# Patient Record
Sex: Female | Born: 1975 | Race: White | Hispanic: No | Marital: Married | Smoking: Current every day smoker
Health system: Southern US, Community
[De-identification: ages and names within clinical notes are randomized; demographics above are authoritative.]

## PROBLEM LIST (undated history)

## (undated) DIAGNOSIS — F319 Bipolar disorder, unspecified: Secondary | ICD-10-CM

## (undated) DIAGNOSIS — E119 Type 2 diabetes mellitus without complications: Secondary | ICD-10-CM

## (undated) DIAGNOSIS — M359 Systemic involvement of connective tissue, unspecified: Secondary | ICD-10-CM

## (undated) DIAGNOSIS — Z8739 Personal history of other diseases of the musculoskeletal system and connective tissue: Secondary | ICD-10-CM

## (undated) DIAGNOSIS — D509 Iron deficiency anemia, unspecified: Secondary | ICD-10-CM

## (undated) DIAGNOSIS — S82492A Other fracture of shaft of left fibula, initial encounter for closed fracture: Secondary | ICD-10-CM

## (undated) DIAGNOSIS — N189 Chronic kidney disease, unspecified: Secondary | ICD-10-CM

## (undated) DIAGNOSIS — E1122 Type 2 diabetes mellitus with diabetic chronic kidney disease: Secondary | ICD-10-CM

## (undated) DIAGNOSIS — Z87442 Personal history of urinary calculi: Secondary | ICD-10-CM

## (undated) DIAGNOSIS — T7840XA Allergy, unspecified, initial encounter: Secondary | ICD-10-CM

## (undated) DIAGNOSIS — I219 Acute myocardial infarction, unspecified: Secondary | ICD-10-CM

## (undated) DIAGNOSIS — I10 Essential (primary) hypertension: Secondary | ICD-10-CM

## (undated) DIAGNOSIS — J45909 Unspecified asthma, uncomplicated: Secondary | ICD-10-CM

## (undated) DIAGNOSIS — M1990 Unspecified osteoarthritis, unspecified site: Secondary | ICD-10-CM

## (undated) DIAGNOSIS — F32A Depression, unspecified: Secondary | ICD-10-CM

## (undated) HISTORY — DX: Allergy, unspecified, initial encounter: T78.40XA

## (undated) HISTORY — DX: Unspecified osteoarthritis, unspecified site: M19.90

## (undated) HISTORY — DX: Depression, unspecified: F32.A

## (undated) HISTORY — DX: Iron deficiency anemia, unspecified: D50.9

## (undated) HISTORY — DX: Type 2 diabetes mellitus without complications: E11.9

## (undated) HISTORY — DX: Bipolar disorder, unspecified: F31.9

## (undated) HISTORY — PX: CYST EXCISION: SHX5701

## (undated) HISTORY — DX: Acute myocardial infarction, unspecified: I21.9

---

## 2001-10-17 HISTORY — PX: TONSILLECTOMY AND ADENOIDECTOMY: SUR1326

## 2007-10-18 HISTORY — PX: LITHOTRIPSY: SUR834

## 2007-10-18 HISTORY — PX: KIDNEY STONE SURGERY: SHX686

## 2008-03-14 ENCOUNTER — Emergency Department: Payer: Self-pay | Admitting: Emergency Medicine

## 2008-03-28 ENCOUNTER — Ambulatory Visit: Payer: Self-pay | Admitting: Specialist

## 2008-04-03 ENCOUNTER — Ambulatory Visit: Payer: Self-pay | Admitting: Specialist

## 2008-04-21 ENCOUNTER — Ambulatory Visit: Payer: Self-pay | Admitting: Specialist

## 2008-10-17 HISTORY — PX: KIDNEY STONE SURGERY: SHX686

## 2011-02-02 ENCOUNTER — Emergency Department: Payer: Self-pay | Admitting: Emergency Medicine

## 2013-02-19 ENCOUNTER — Emergency Department: Payer: Self-pay | Admitting: Emergency Medicine

## 2013-02-22 LAB — BETA STREP CULTURE(ARMC)

## 2014-02-23 ENCOUNTER — Emergency Department: Payer: Self-pay | Admitting: Emergency Medicine

## 2014-04-09 ENCOUNTER — Emergency Department: Payer: Self-pay | Admitting: Emergency Medicine

## 2014-04-09 LAB — CBC
HCT: 38.5 % (ref 35.0–47.0)
HGB: 12.3 g/dL (ref 12.0–16.0)
MCH: 24.7 pg — ABNORMAL LOW (ref 26.0–34.0)
MCHC: 31.9 g/dL — AB (ref 32.0–36.0)
MCV: 77 fL — ABNORMAL LOW (ref 80–100)
Platelet: 189 10*3/uL (ref 150–440)
RBC: 4.98 10*6/uL (ref 3.80–5.20)
RDW: 15.5 % — ABNORMAL HIGH (ref 11.5–14.5)
WBC: 8.9 10*3/uL (ref 3.6–11.0)

## 2014-04-09 LAB — BASIC METABOLIC PANEL
ANION GAP: 7 (ref 7–16)
BUN: 10 mg/dL (ref 7–18)
CO2: 25 mmol/L (ref 21–32)
CREATININE: 0.89 mg/dL (ref 0.60–1.30)
Calcium, Total: 8.8 mg/dL (ref 8.5–10.1)
Chloride: 107 mmol/L (ref 98–107)
EGFR (Non-African Amer.): >60
Glucose: 96 mg/dL (ref 65–99)
Osmolality: 276 (ref 275–301)
POTASSIUM: 4.2 mmol/L (ref 3.5–5.1)
SODIUM: 139 mmol/L (ref 136–145)

## 2014-04-09 LAB — TROPONIN I: Troponin-I: <0.02 ng/mL

## 2014-04-10 DIAGNOSIS — R0602 Shortness of breath: Secondary | ICD-10-CM | POA: Insufficient documentation

## 2014-11-10 ENCOUNTER — Emergency Department: Payer: Self-pay | Admitting: Emergency Medicine

## 2015-03-30 ENCOUNTER — Encounter: Payer: Self-pay | Admitting: *Deleted

## 2015-04-08 ENCOUNTER — Ambulatory Visit: Payer: Self-pay | Admitting: General Surgery

## 2015-04-14 ENCOUNTER — Encounter: Payer: Self-pay | Admitting: General Surgery

## 2015-04-14 ENCOUNTER — Ambulatory Visit (INDEPENDENT_AMBULATORY_CARE_PROVIDER_SITE_OTHER): Payer: BC Managed Care – PPO | Admitting: General Surgery

## 2015-04-14 VITALS — BP 130/74 | HR 82 | Resp 14 | Ht 72.0 in | Wt 265.0 lb

## 2015-04-14 DIAGNOSIS — L729 Follicular cyst of the skin and subcutaneous tissue, unspecified: Secondary | ICD-10-CM

## 2015-04-14 NOTE — Progress Notes (Signed)
Patient ID: [name], female   DOB: 1976-07-31, 39 y.o.   MRN: [id number]  Chief Complaint  Patient presents with  . Cyst    HPI [name] is a 39 y.o. female here today for an evaluation of an right axillary cyst.Patient noticed this area about three month. No change in size but their is some discomfort due to her bra line and movement of arm. Patient has lost 34 pounds over the last three months. HPI  Past Medical History  Diagnosis Date  . Bipolar 1 disorder   . Arthritis     Past Surgical History  Procedure Laterality Date  . Kidney stone surgery  2010  . Tonsillectomy and adenoidectomy  2003    Family History  Problem Relation Age of Onset  . Hypertension Mother     Social History History  Substance Use Topics  . Smoking status: Current Every Day Smoker -- 1.00 packs/day for 20 years    Types: Cigarettes  . Smokeless tobacco: Not on file  . Alcohol Use: 0.0 oz/week    0 Standard drinks or equivalent per week    Allergies  Allergen Reactions  . Augmentin [Amoxicillin-Pot Clavulanate] Rash    Current Outpatient Prescriptions  Medication Sig Dispense Refill  . clonazePAM (KLONOPIN) 1 MG tablet TK 1 T PO BID  2  . PROAIR HFA 108 (90 BASE) MCG/ACT inhaler INHALE 2 PUFFS PO Q 6 H  11  . SEROQUEL XR 150 MG 24 hr tablet TK 1 T PO  QPM AN HOUR AFTER EATING  2  . traZODone (DESYREL) 100 MG tablet TK 1 TO 2 TS PO QHS  2  . phentermine 37.5 MG capsule Take 37.5 mg by mouth every morning.     No current facility-administered medications for this visit.    Review of Systems Review of Systems  Constitutional: Negative.   Respiratory: Negative.   Cardiovascular: Negative.     Blood pressure 130/74, pulse 82, resp. rate 14, height 6' (1.829 m), weight 265 lb (120.203 kg), last menstrual period 04/07/2015.  Physical Exam Physical Exam  Constitutional: She is oriented to person, place, and time. She appears well-developed and well-nourished.   Eyes: Conjunctivae are normal. No scleral icterus.  Neck: Neck supple.  Cardiovascular: Normal rate, regular rhythm and normal heart sounds.   Pulmonary/Chest: Effort normal and breath sounds normal.    Lymphadenopathy:    She has no cervical adenopathy.       Left axillary: No pectoral and no lateral adenopathy present. 5 mm dermial thickening at the right axilla.  Neurological: She is alert and oriented to person, place, and time.  Skin: Skin is warm and dry.    Data Reviewed PCP notes from [facility].  Assessment    Dermal cyst, symptomatic with local irritation.    Plan    The area was removed after using 10 mL of 0.5% Xylocaine with 0.25% Marcaine with 10-1998 of epinephrine. Chlorpropamide applied to the skin. The superficial nodule was removed through a 1 cm elliptical incision. The skin defect was closed with interrupted 4-0 nylon simple sutures. Telfa and Tegaderm dressing applied.  The patient will make use of OTC medications as needed for comfort. Intermittent use of ice for the next 48 hours was encouraged. She'll return in approximately 7-10 days for suture removal.   Ref. Dr. Percell Boston, [place] 04/15/2015, 8:29 PM

## 2015-04-14 NOTE — Patient Instructions (Addendum)
1 week nurse  

## 2015-04-15 DIAGNOSIS — L729 Follicular cyst of the skin and subcutaneous tissue, unspecified: Secondary | ICD-10-CM | POA: Insufficient documentation

## 2015-04-22 ENCOUNTER — Ambulatory Visit (INDEPENDENT_AMBULATORY_CARE_PROVIDER_SITE_OTHER): Payer: BC Managed Care – PPO | Admitting: *Deleted

## 2015-04-22 DIAGNOSIS — L729 Follicular cyst of the skin and subcutaneous tissue, unspecified: Secondary | ICD-10-CM

## 2015-04-22 NOTE — Patient Instructions (Signed)
The patient is aware to call back for any questions or concerns.  

## 2015-04-22 NOTE — Progress Notes (Signed)
Patient came in today for a wound check/right axilla.  The wound is clean, with no signs of infection noted. Sutures removed. Aware of pathology. Follow up as needed.

## 2015-05-15 ENCOUNTER — Encounter: Payer: Self-pay | Admitting: Emergency Medicine

## 2015-05-15 ENCOUNTER — Emergency Department: Payer: BC Managed Care – PPO

## 2015-05-15 ENCOUNTER — Emergency Department
Admission: EM | Admit: 2015-05-15 | Discharge: 2015-05-15 | Disposition: A | Discharge status: Home / Self Care | Payer: BC Managed Care – PPO | Attending: Student | Admitting: Student

## 2015-05-15 DIAGNOSIS — S199XXA Unspecified injury of neck, initial encounter: Secondary | ICD-10-CM | POA: Diagnosis present

## 2015-05-15 DIAGNOSIS — Y92410 Unspecified street and highway as the place of occurrence of the external cause: Secondary | ICD-10-CM | POA: Diagnosis not present

## 2015-05-15 DIAGNOSIS — Y9389 Activity, other specified: Secondary | ICD-10-CM | POA: Diagnosis not present

## 2015-05-15 DIAGNOSIS — Z3202 Encounter for pregnancy test, result negative: Secondary | ICD-10-CM | POA: Insufficient documentation

## 2015-05-15 DIAGNOSIS — S161XXA Strain of muscle, fascia and tendon at neck level, initial encounter: Secondary | ICD-10-CM | POA: Diagnosis not present

## 2015-05-15 DIAGNOSIS — Y998 Other external cause status: Secondary | ICD-10-CM | POA: Insufficient documentation

## 2015-05-15 DIAGNOSIS — S39012A Strain of muscle, fascia and tendon of lower back, initial encounter: Secondary | ICD-10-CM | POA: Diagnosis not present

## 2015-05-15 DIAGNOSIS — Z720 Tobacco use: Secondary | ICD-10-CM | POA: Diagnosis not present

## 2015-05-15 LAB — POCT PREGNANCY, URINE: PREG TEST UR: NEGATIVE

## 2015-05-15 MED ORDER — NAPROXEN 500 MG PO TABS: 500.0000 mg | ORAL_TABLET | Freq: Two times a day (BID) | ORAL | Status: DC

## 2015-05-15 MED ORDER — TRAMADOL HCL 50 MG PO TABS
50.0000 mg | ORAL_TABLET | Freq: Once | ORAL | Status: AC
  Administered 2015-05-15: 50 mg via ORAL
  Filled 2015-05-15: qty 1

## 2015-05-15 MED ORDER — IBUPROFEN 800 MG PO TABS
800.0000 mg | ORAL_TABLET | Freq: Once | ORAL | Status: AC
  Administered 2015-05-15: 800 mg via ORAL
  Filled 2015-05-15: qty 1

## 2015-05-15 MED ORDER — CYCLOBENZAPRINE HCL 10 MG PO TABS
5.0000 mg | ORAL_TABLET | Freq: Once | ORAL | Status: AC
  Administered 2015-05-15: 5 mg via ORAL
  Filled 2015-05-15: qty 1

## 2015-05-15 MED ORDER — CYCLOBENZAPRINE HCL 5 MG PO TABS: 5.0000 mg | ORAL_TABLET | Freq: Three times a day (TID) | ORAL | Status: DC | PRN

## 2015-05-15 NOTE — ED Notes (Signed)
Pt signed e signature but did not go thru.  D/c inst to pt   RX given to pt.
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
## 2015-05-15 NOTE — Discharge Instructions (Signed)
Cervical Sprain A cervical sprain is an injury in the neck in which the strong, fibrous tissues (ligaments) that connect your neck bones stretch or tear. Cervical sprains can range from mild to severe. Severe cervical sprains can cause the neck vertebrae to be unstable. This can lead to damage of the spinal cord and can result in serious nervous system problems. The amount of time it takes for a cervical sprain to get better depends on the cause and extent of the injury. Most cervical sprains heal in 1 to 3 weeks. CAUSES  Severe cervical sprains may be caused by:   Contact sport injuries (such as from football, rugby, wrestling, hockey, auto racing, gymnastics, diving, martial arts, or boxing).   Motor vehicle collisions.   Whiplash injuries. This is an injury from a sudden forward and backward whipping movement of the head and neck.  Falls.  Mild cervical sprains may be caused by:   Being in an awkward position, such as while cradling a telephone between your ear and shoulder.   Sitting in a chair that does not offer proper support.   Working at a poorly [employer] station.   Looking up or down for long periods of time.  SYMPTOMS   Pain, soreness, stiffness, or a burning sensation in the front, back, or sides of the neck. This discomfort may develop immediately after the injury or slowly, 24 hours or more after the injury.   Pain or tenderness directly in the middle of the back of the neck.   Shoulder or upper back pain.   Limited ability to move the neck.   Headache.   Dizziness.   Weakness, numbness, or tingling in the hands or arms.   Muscle spasms.   Difficulty swallowing or chewing.   Tenderness and swelling of the neck.  DIAGNOSIS  Most of the time your health care provider can diagnose a cervical sprain by taking your history and doing a physical exam. Your health care provider will ask about previous neck injuries and any known neck  problems, such as arthritis in the neck. X-rays may be taken to find out if there are any other problems, such as with the bones of the neck. Other tests, such as a CT scan or MRI, may also be needed.  TREATMENT  Treatment depends on the severity of the cervical sprain. Mild sprains can be treated with rest, keeping the neck in place (immobilization), and pain medicines. Severe cervical sprains are immediately immobilized. Further treatment is done to help with pain, muscle spasms, and other symptoms and may include:  Medicines, such as pain relievers, numbing medicines, or muscle relaxants.   Physical therapy. This may involve stretching exercises, strengthening exercises, and posture training. Exercises and improved posture can help stabilize the neck, strengthen muscles, and help stop symptoms from returning.  HOME CARE INSTRUCTIONS   Put ice on the injured area.   Put ice in a plastic bag.   Place a towel between your skin and the bag.   Leave the ice on for 15-20 minutes, 3-4 times a day.   If your injury was severe, you may have been given a cervical collar to wear. A cervical collar is a two-piece collar designed to keep your neck from moving while it heals.  Do not remove the collar unless instructed by your health care provider.  If you have long hair, keep it outside of the collar.  Ask your health care provider before making any adjustments to your collar. Minor  adjustments may be required over time to improve comfort and reduce pressure on your chin or on the back of your head.  Ifyou are allowed to remove the collar for cleaning or bathing, follow your health care provider's instructions on how to do so safely.  Keep your collar clean by wiping it with mild soap and water and drying it completely. If the collar you have been given includes removable pads, remove them every 1-2 days and hand wash them with soap and water. Allow them to air dry. They should be completely  dry before you wear them in the collar.  If you are allowed to remove the collar for cleaning and bathing, wash and dry the skin of your neck. Check your skin for irritation or sores. If you see any, tell your health care provider.  Do not drive while wearing the collar.   Only take over-the-counter or prescription medicines for pain, discomfort, or fever as directed by your health care provider.   Keep all follow-up appointments as directed by your health care provider.   Keep all physical therapy appointments as directed by your health care provider.   Make any needed adjustments to your workstation to promote good posture.   Avoid positions and activities that make your symptoms worse.   Warm up and stretch before being active to help prevent problems.  SEEK MEDICAL CARE IF:   Your pain is not controlled with medicine.   You are unable to decrease your pain medicine over time as planned.   Your activity level is not improving as expected.  SEEK IMMEDIATE MEDICAL CARE IF:   You develop any bleeding.  You develop stomach upset.  You have signs of an allergic reaction to your medicine.   Your symptoms get worse.   You develop new, unexplained symptoms.   You have numbness, tingling, weakness, or paralysis in any part of your body.  MAKE SURE YOU:   Understand these instructions.  Will watch your condition.  Will get help right away if you are not doing well or get worse. Document Released: 07/31/2007 Document Revised: 10/08/2013 Document Reviewed: 04/10/2013 [facility] Patient Information 2015 [place], [place]. This information is not intended to replace advice given to you by your health care provider. Make sure you discuss any questions you have with your health care provider.  Cryotherapy Cryotherapy means treatment with cold. Ice or gel packs can be used to reduce both pain and swelling. Ice is the most helpful within the first 24 to 48 hours after an  injury or flare-up from overusing a muscle or joint. Sprains, strains, spasms, burning pain, shooting pain, and aches can all be eased with ice. Ice can also be used when recovering from surgery. Ice is effective, has very few side effects, and is safe for most people to use. PRECAUTIONS  Ice is not a safe treatment option for people with:  Raynaud phenomenon. This is a condition affecting small blood vessels in the extremities. Exposure to cold may cause your problems to return.  Cold hypersensitivity. There are many forms of cold hypersensitivity, including:  Cold urticaria. Red, itchy hives appear on the skin when the tissues begin to warm after being iced.  Cold erythema. This is a red, itchy rash caused by exposure to cold.  Cold hemoglobinuria. Red blood cells break down when the tissues begin to warm after being iced. The hemoglobin that carry oxygen are passed into the urine because they cannot combine with blood proteins fast enough.  Numbness or altered sensitivity in the area being iced. If you have any of the following conditions, do not use ice until you have discussed cryotherapy with your caregiver:  Heart conditions, such as arrhythmia, angina, or chronic heart disease.  High blood pressure.  Healing wounds or open skin in the area being iced.  Current infections.  Rheumatoid arthritis.  Poor circulation.  Diabetes. Ice slows the blood flow in the region it is applied. This is beneficial when trying to stop inflamed tissues from spreading irritating chemicals to surrounding tissues. However, if you expose your skin to cold temperatures for too long or without the proper protection, you can damage your skin or nerves. Watch for signs of skin damage due to cold. HOME CARE INSTRUCTIONS Follow these tips to use ice and cold packs safely.  Place a dry or damp towel between the ice and skin. A damp towel will cool the skin more quickly, so you may need to shorten the time  that the ice is used.  For a more rapid response, add gentle compression to the ice.  Ice for no more than 10 to 20 minutes at a time. The bonier the area you are icing, the less time it will take to get the benefits of ice.  Check your skin after 5 minutes to make sure there are no signs of a poor response to cold or skin damage.  Rest 20 minutes or more between uses.  Once your skin is numb, you can end your treatment. You can test numbness by very lightly touching your skin. The touch should be so light that you do not see the skin dimple from the pressure of your fingertip. When using ice, most people will feel these normal sensations in this order: cold, burning, aching, and numbness.  Do not use ice on someone who cannot communicate their responses to pain, such as small children or people with dementia. HOW TO MAKE AN ICE PACK Ice packs are the most common way to use ice therapy. Other methods include ice massage, ice baths, and cryosprays. Muscle creams that cause a cold, tingly feeling do not offer the same benefits that ice offers and should not be used as a substitute unless recommended by your caregiver. To make an ice pack, do one of the following:  Place crushed ice or a bag of frozen vegetables in a sealable plastic bag. Squeeze out the excess air. Place this bag inside another plastic bag. Slide the bag into a pillowcase or place a damp towel between your skin and the bag.  Mix 3 parts water with 1 part rubbing alcohol. Freeze the mixture in a sealable plastic bag. When you remove the mixture from the freezer, it will be slushy. Squeeze out the excess air. Place this bag inside another plastic bag. Slide the bag into a pillowcase or place a damp towel between your skin and the bag. SEEK MEDICAL CARE IF:  You develop white spots on your skin. This may give the skin a blotchy (mottled) appearance.  Your skin turns blue or pale.  Your skin becomes waxy or hard.  Your swelling  gets worse. MAKE SURE YOU:   Understand these instructions.  Will watch your condition.  Will get help right away if you are not doing well or get worse. Document Released: 05/30/2011 Document Revised: 02/17/2014 Document Reviewed: 05/30/2011 [facility] Patient Information 2015 [place], [place]. This information is not intended to replace advice given to you by your health care provider. Make  sure you discuss any questions you have with your health care provider.  Low Back Strain with Rehab A strain is an injury in which a tendon or muscle is torn. The muscles and tendons of the lower back are vulnerable to strains. However, these muscles and tendons are very strong and require a great force to be injured. Strains are classified into three categories. Grade 1 strains cause pain, but the tendon is not lengthened. Grade 2 strains include a lengthened ligament, due to the ligament being stretched or partially ruptured. With grade 2 strains there is still function, although the function may be decreased. Grade 3 strains involve a complete tear of the tendon or muscle, and function is usually impaired. SYMPTOMS   Pain in the lower back.  Pain that affects one side more than the other.  Pain that gets worse with movement and may be felt in the hip, buttocks, or back of the thigh.  Muscle spasms of the muscles in the back.  Swelling along the muscles of the back.  Loss of strength of the back muscles.  Crackling sound (crepitation) when the muscles are touched. CAUSES  Lower back strains occur when a force is placed on the muscles or tendons that is greater than they can handle. Common causes of injury include:  Prolonged overuse of the muscle-tendon units in the lower back, usually from incorrect posture.  A single violent injury or force applied to the back. RISK INCREASES WITH:  Sports that involve twisting forces on the spine or a lot of bending at the waist (football, rugby,  weightlifting, bowling, golf, tennis, speed skating, racquetball, swimming, running, gymnastics, diving).  Poor strength and flexibility.  Failure to warm up properly before activity.  Family history of lower back pain or disk disorders.  Previous back injury or surgery (especially fusion).  Poor posture with lifting, especially heavy objects.  Prolonged sitting, especially with poor posture. PREVENTION   Learn and use proper posture when sitting or lifting (maintain proper posture when sitting, lift using the knees and legs, not at the waist).  Warm up and stretch properly before activity.  Allow for adequate recovery between workouts.  Maintain physical fitness:  Strength, flexibility, and endurance.  Cardiovascular fitness. PROGNOSIS  If treated properly, lower back strains usually heal within 6 weeks. RELATED COMPLICATIONS   Recurring symptoms, resulting in a chronic problem.  Chronic inflammation, scarring, and partial muscle-tendon tear.  Delayed healing or resolution of symptoms.  Prolonged disability. TREATMENT  Treatment first involves the use of ice and medicine, to reduce pain and inflammation. The use of strengthening and stretching exercises may help reduce pain with activity. These exercises may be performed at home or with a therapist. Severe injuries may require referral to a therapist for further evaluation and treatment, such as ultrasound. Your caregiver may advise that you wear a back brace or corset, to help reduce pain and discomfort. Often, prolonged bed rest results in greater harm then benefit. Corticosteroid injections may be recommended. However, these should be reserved for the most serious cases. It is important to avoid using your back when lifting objects. At night, sleep on your back on a firm mattress with a pillow placed under your knees. If non-surgical treatment is unsuccessful, surgery may be needed.  MEDICATION   If pain medicine is  needed, nonsteroidal anti-inflammatory medicines (aspirin and ibuprofen), or other minor pain relievers (acetaminophen), are often advised.  Do not take pain medicine for 7 days before surgery.  Prescription pain  relievers may be given, if your caregiver thinks they are needed. Use only as directed and only as much as you need.  Ointments applied to the skin may be helpful.  Corticosteroid injections may be given by your caregiver. These injections should be reserved for the most serious cases, because they may only be given a certain number of times. HEAT AND COLD  Cold treatment (icing) should be applied for 10 to 15 minutes every 2 to 3 hours for inflammation and pain, and immediately after activity that aggravates your symptoms. Use ice packs or an ice massage.  Heat treatment may be used before performing stretching and strengthening activities prescribed by your caregiver, physical therapist, or athletic trainer. Use a heat pack or a warm water soak. SEEK MEDICAL CARE IF:   Symptoms get worse or do not improve in 2 to 4 weeks, despite treatment.  You develop numbness, weakness, or loss of bowel or bladder function.  New, unexplained symptoms develop. (Drugs used in treatment may produce side effects.) EXERCISES  RANGE OF MOTION (ROM) AND STRETCHING EXERCISES - Low Back Strain Most people with lower back pain will find that their symptoms get worse with excessive bending forward (flexion) or arching at the lower back (extension). The exercises which will help resolve your symptoms will focus on the opposite motion.  Your physician, physical therapist or athletic trainer will help you determine which exercises will be most helpful to resolve your lower back pain. Do not complete any exercises without first consulting with your caregiver. Discontinue any exercises which make your symptoms worse until you speak to your caregiver.  If you have pain, numbness or tingling which travels down  into your buttocks, leg or foot, the goal of the therapy is for these symptoms to move closer to your back and eventually resolve. Sometimes, these leg symptoms will get better, but your lower back pain may worsen. This is typically an indication of progress in your rehabilitation. Be very alert to any changes in your symptoms and the activities in which you participated in the 24 hours prior to the change. Sharing this information with your caregiver will allow him/her to most efficiently treat your condition.  These exercises may help you when beginning to rehabilitate your injury. Your symptoms may resolve with or without further involvement from your physician, physical therapist or athletic trainer. While completing these exercises, remember:  Restoring tissue flexibility helps normal motion to return to the joints. This allows healthier, less painful movement and activity.  An effective stretch should be held for at least 30 seconds.  A stretch should never be painful. You should only feel a gentle lengthening or release in the stretched tissue. FLEXION RANGE OF MOTION AND STRETCHING EXERCISES: STRETCH - Flexion, Single Knee to Chest   Lie on a firm bed or floor with both legs extended in front of you.  Keeping one leg in contact with the floor, bring your opposite knee to your chest. Hold your leg in place by either grabbing behind your thigh or at your knee.  Pull until you feel a gentle stretch in your lower back. Hold __________ seconds.  Slowly release your grasp and repeat the exercise with the opposite side. Repeat __________ times. Complete this exercise __________ times per day.  STRETCH - Flexion, Double Knee to Chest   Lie on a firm bed or floor with both legs extended in front of you.  Keeping one leg in contact with the floor, bring your opposite knee to  your chest.  Tense your stomach muscles to support your back and then lift your other knee to your chest. Hold your  legs in place by either grabbing behind your thighs or at your knees.  Pull both knees toward your chest until you feel a gentle stretch in your lower back. Hold __________ seconds.  Tense your stomach muscles and slowly return one leg at a time to the floor. Repeat __________ times. Complete this exercise __________ times per day.  STRETCH - Low Trunk Rotation  Lie on a firm bed or floor. Keeping your legs in front of you, bend your knees so they are both pointed toward the ceiling and your feet are flat on the floor.  Extend your arms out to the side. This will stabilize your upper body by keeping your shoulders in contact with the floor.  Gently and slowly drop both knees together to one side until you feel a gentle stretch in your lower back. Hold for __________ seconds.  Tense your stomach muscles to support your lower back as you bring your knees back to the starting position. Repeat the exercise to the other side. Repeat __________ times. Complete this exercise __________ times per day  EXTENSION RANGE OF MOTION AND FLEXIBILITY EXERCISES: STRETCH - Extension, Prone on Elbows   Lie on your stomach on the floor, a bed will be too soft. Place your palms about shoulder width apart and at the height of your head.  Place your elbows under your shoulders. If this is too painful, stack pillows under your chest.  Allow your body to relax so that your hips drop lower and make contact more completely with the floor.  Hold this position for __________ seconds.  Slowly return to lying flat on the floor. Repeat __________ times. Complete this exercise __________ times per day.  RANGE OF MOTION - Extension, Prone Press Ups  Lie on your stomach on the floor, a bed will be too soft. Place your palms about shoulder width apart and at the height of your head.  Keeping your back as relaxed as possible, slowly straighten your elbows while keeping your hips on the floor. You may adjust the  placement of your hands to maximize your comfort. As you gain motion, your hands will come more underneath your shoulders.  Hold this position __________ seconds.  Slowly return to lying flat on the floor. Repeat __________ times. Complete this exercise __________ times per day.  RANGE OF MOTION- Quadruped, Neutral Spine   Assume a hands and knees position on a firm surface. Keep your hands under your shoulders and your knees under your hips. You may place padding under your knees for comfort.  Drop your head and point your tail bone toward the ground below you. This will round out your lower back like an angry cat. Hold this position for __________ seconds.  Slowly lift your head and release your tail bone so that your back sags into a large arch, like an old horse.  Hold this position for __________ seconds.  Repeat this until you feel limber in your lower back.  Now, find your "sweet spot." This will be the most comfortable position somewhere between the two previous positions. This is your neutral spine. Once you have found this position, tense your stomach muscles to support your lower back.  Hold this position for __________ seconds. Repeat __________ times. Complete this exercise __________ times per day.  STRENGTHENING EXERCISES - Low Back Strain These exercises may help you when  beginning to rehabilitate your injury. These exercises should be done near your "sweet spot." This is the neutral, low-back arch, somewhere between fully rounded and fully arched, that is your least painful position. When performed in this safe range of motion, these exercises can be used for people who have either a flexion or extension based injury. These exercises may resolve your symptoms with or without further involvement from your physician, physical therapist or athletic trainer. While completing these exercises, remember:   Muscles can gain both the endurance and the strength needed for everyday  activities through controlled exercises.  Complete these exercises as instructed by your physician, physical therapist or athletic trainer. Increase the resistance and repetitions only as guided.  You may experience muscle soreness or fatigue, but the pain or discomfort you are trying to eliminate should never worsen during these exercises. If this pain does worsen, stop and make certain you are following the directions exactly. If the pain is still present after adjustments, discontinue the exercise until you can discuss the trouble with your caregiver. STRENGTHENING - Deep Abdominals, Pelvic Tilt  Lie on a firm bed or floor. Keeping your legs in front of you, bend your knees so they are both pointed toward the ceiling and your feet are flat on the floor.  Tense your lower abdominal muscles to press your lower back into the floor. This motion will rotate your pelvis so that your tail bone is scooping upwards rather than pointing at your feet or into the floor.  With a gentle tension and even breathing, hold this position for __________ seconds. Repeat __________ times. Complete this exercise __________ times per day.  STRENGTHENING - Abdominals, Crunches   Lie on a firm bed or floor. Keeping your legs in front of you, bend your knees so they are both pointed toward the ceiling and your feet are flat on the floor. Cross your arms over your chest.  Slightly tip your chin down without bending your neck.  Tense your abdominals and slowly lift your trunk high enough to just clear your shoulder blades. Lifting higher can put excessive stress on the lower back and does not further strengthen your abdominal muscles.  Control your return to the starting position. Repeat __________ times. Complete this exercise __________ times per day.  STRENGTHENING - Quadruped, Opposite UE/LE Lift   Assume a hands and knees position on a firm surface. Keep your hands under your shoulders and your knees under your  hips. You may place padding under your knees for comfort.  Find your neutral spine and gently tense your abdominal muscles so that you can maintain this position. Your shoulders and hips should form a rectangle that is parallel with the floor and is not twisted.  Keeping your trunk steady, lift your right hand no higher than your shoulder and then your left leg no higher than your hip. Make sure you are not holding your breath. Hold this position __________ seconds.  Continuing to keep your abdominal muscles tense and your back steady, slowly return to your starting position. Repeat with the opposite arm and leg. Repeat __________ times. Complete this exercise __________ times per day.  STRENGTHENING - Lower Abdominals, Double Knee Lift  Lie on a firm bed or floor. Keeping your legs in front of you, bend your knees so they are both pointed toward the ceiling and your feet are flat on the floor.  Tense your abdominal muscles to brace your lower back and slowly lift both of your knees  until they come over your hips. Be certain not to hold your breath.  Hold __________ seconds. Using your abdominal muscles, return to the starting position in a slow and controlled manner. Repeat __________ times. Complete this exercise __________ times per day.  POSTURE AND BODY MECHANICS CONSIDERATIONS - Low Back Strain Keeping correct posture when sitting, standing or completing your activities will reduce the stress put on different body tissues, allowing injured tissues a chance to heal and limiting painful experiences. The following are general guidelines for improved posture. Your physician or physical therapist will provide you with any instructions specific to your needs. While reading these guidelines, remember:  The exercises prescribed by your provider will help you have the flexibility and strength to maintain correct postures.  The correct posture provides the best environment for your joints to work.  All of your joints have less wear and tear when properly supported by a spine with good posture. This means you will experience a healthier, less painful body.  Correct posture must be practiced with all of your activities, especially prolonged sitting and standing. Correct posture is as important when doing repetitive low-stress activities (typing) as it is when doing a single heavy-load activity (lifting). RESTING POSITIONS Consider which positions are most painful for you when choosing a resting position. If you have pain with flexion-based activities (sitting, bending, stooping, squatting), choose a position that allows you to rest in a less flexed posture. You would want to avoid curling into a fetal position on your side. If your pain worsens with extension-based activities (prolonged standing, working overhead), avoid resting in an extended position such as sleeping on your stomach. Most people will find more comfort when they rest with their spine in a more neutral position, neither too rounded nor too arched. Lying on a non-sagging bed on your side with a pillow between your knees, or on your back with a pillow under your knees will often provide some relief. Keep in mind, being in any one position for a prolonged period of time, no matter how correct your posture, can still lead to stiffness. PROPER SITTING POSTURE In order to minimize stress and discomfort on your spine, you must sit with correct posture. Sitting with good posture should be effortless for a healthy body. Returning to good posture is a gradual process. Many people can work toward this most comfortably by using various supports until they have the flexibility and strength to maintain this posture on their own. When sitting with proper posture, your ears will fall over your shoulders and your shoulders will fall over your hips. You should use the back of the chair to support your upper back. Your lower back will be in a neutral  position, just slightly arched. You may place a small pillow or folded towel at the base of your lower back for support.  When working at a desk, create an environment that supports good, upright posture. Without extra support, muscles tire, which leads to excessive strain on joints and other tissues. Keep these recommendations in mind: CHAIR:  A chair should be able to slide under your desk when your back makes contact with the back of the chair. This allows you to work closely.  The chair's height should allow your eyes to be level with the upper part of your monitor and your hands to be slightly lower than your elbows. BODY POSITION  Your feet should make contact with the floor. If this is not possible, use a foot rest.  Keep your ears over your shoulders. This will reduce stress on your neck and lower back. INCORRECT SITTING POSTURES  If you are feeling tired and unable to assume a healthy sitting posture, do not slouch or slump. This puts excessive strain on your back tissues, causing more damage and pain. Healthier options include:  Using more support, like a lumbar pillow.  Switching tasks to something that requires you to be upright or walking.  Talking a brief walk.  Lying down to rest in a neutral-spine position. PROLONGED STANDING WHILE SLIGHTLY LEANING FORWARD  When completing a task that requires you to lean forward while standing in one place for a long time, place either foot up on a stationary 2-4 inch high object to help maintain the best posture. When both feet are on the ground, the lower back tends to lose its slight inward curve. If this curve flattens (or becomes too large), then the back and your other joints will experience too much stress, tire more quickly, and can cause pain. CORRECT STANDING POSTURES Proper standing posture should be assumed with all daily activities, even if they only take a few moments, like when brushing your teeth. As in sitting, your ears  should fall over your shoulders and your shoulders should fall over your hips. You should keep a slight tension in your abdominal muscles to brace your spine. Your tailbone should point down to the ground, not behind your body, resulting in an over-extended swayback posture.  INCORRECT STANDING POSTURES  Common incorrect standing postures include a forward head, locked knees and/or an excessive swayback. WALKING Walk with an upright posture. Your ears, shoulders and hips should all line-up. PROLONGED ACTIVITY IN A FLEXED POSITION When completing a task that requires you to bend forward at your waist or lean over a low surface, try to find a way to stabilize 3 out of 4 of your limbs. You can place a hand or elbow on your thigh or rest a knee on the surface you are reaching across. This will provide you more stability so that your muscles do not fatigue as quickly. By keeping your knees relaxed, or slightly bent, you will also reduce stress across your lower back. CORRECT LIFTING TECHNIQUES DO :   Assume a wide stance. This will provide you more stability and the opportunity to get as close as possible to the object which you are lifting.  Tense your abdominals to brace your spine. Bend at the knees and hips. Keeping your back locked in a neutral-spine position, lift using your leg muscles. Lift with your legs, keeping your back straight.  Test the weight of unknown objects before attempting to lift them.  Try to keep your elbows locked down at your sides in order get the best strength from your shoulders when carrying an object.  Always ask for help when lifting heavy or awkward objects. INCORRECT LIFTING TECHNIQUES DO NOT:   Lock your knees when lifting, even if it is a small object.  Bend and twist. Pivot at your feet or move your feet when needing to change directions.  Assume that you can safely pick up even a paper clip without proper posture. Document Released: 10/03/2005 Document  Revised: 12/26/2011 Document Reviewed: 01/15/2009 [facility] Patient Information 2015 [place], [place]. This information is not intended to replace advice given to you by your health care provider. Make sure you discuss any questions you have with your health care provider.

## 2015-07-05 ENCOUNTER — Emergency Department
Admission: EM | Admit: 2015-07-05 | Discharge: 2015-07-05 | Disposition: A | Discharge status: Home / Self Care | Payer: BC Managed Care – PPO | Attending: Emergency Medicine | Admitting: Emergency Medicine

## 2015-07-05 ENCOUNTER — Encounter: Payer: Self-pay | Admitting: *Deleted

## 2015-07-05 ENCOUNTER — Emergency Department: Payer: BC Managed Care – PPO

## 2015-07-05 DIAGNOSIS — Z720 Tobacco use: Secondary | ICD-10-CM | POA: Diagnosis not present

## 2015-07-05 DIAGNOSIS — M545 Low back pain, unspecified: Secondary | ICD-10-CM

## 2015-07-05 DIAGNOSIS — Z3202 Encounter for pregnancy test, result negative: Secondary | ICD-10-CM | POA: Diagnosis not present

## 2015-07-05 DIAGNOSIS — Z791 Long term (current) use of non-steroidal anti-inflammatories (NSAID): Secondary | ICD-10-CM | POA: Insufficient documentation

## 2015-07-05 DIAGNOSIS — M549 Dorsalgia, unspecified: Secondary | ICD-10-CM

## 2015-07-05 LAB — URINALYSIS COMPLETE WITH MICROSCOPIC (ARMC ONLY)
Bilirubin Urine: NEGATIVE
GLUCOSE, UA: NEGATIVE mg/dL
Hgb urine dipstick: NEGATIVE
Ketones, ur: NEGATIVE mg/dL
Nitrite: NEGATIVE
PH: 7 (ref 5.0–8.0)
PROTEIN: NEGATIVE mg/dL
Specific Gravity, Urine: 1.016 (ref 1.005–1.030)

## 2015-07-05 LAB — CBC
HCT: 42 % (ref 35.0–47.0)
Hemoglobin: 13.9 g/dL (ref 12.0–16.0)
MCH: 25.4 pg — ABNORMAL LOW (ref 26.0–34.0)
MCHC: 33.1 g/dL (ref 32.0–36.0)
MCV: 76.7 fL — ABNORMAL LOW (ref 80.0–100.0)
PLATELETS: 199 10*3/uL (ref 150–440)
RBC: 5.47 MIL/uL — AB (ref 3.80–5.20)
RDW: 15.4 % — ABNORMAL HIGH (ref 11.5–14.5)
WBC: 12.3 10*3/uL — ABNORMAL HIGH (ref 3.6–11.0)

## 2015-07-05 LAB — PREGNANCY, URINE: PREG TEST UR: NEGATIVE

## 2015-07-05 MED ORDER — TRAMADOL HCL 50 MG PO TABS: 50.0000 mg | ORAL_TABLET | Freq: Two times a day (BID) | ORAL | Status: DC

## 2015-07-05 MED ORDER — MORPHINE SULFATE (PF) 2 MG/ML IV SOLN
2.0000 mg | Freq: Once | INTRAVENOUS | Status: AC
  Administered 2015-07-05: 2 mg via INTRAVENOUS

## 2015-07-05 MED ORDER — ONDANSETRON HCL 4 MG PO TABS: 4.0000 mg | ORAL_TABLET | Freq: Four times a day (QID) | ORAL | Status: DC | PRN

## 2015-07-05 MED ORDER — ONDANSETRON HCL 4 MG/2ML IJ SOLN
4.0000 mg | Freq: Once | INTRAMUSCULAR | Status: AC
  Administered 2015-07-05: 4 mg via INTRAVENOUS
  Filled 2015-07-05: qty 2

## 2015-07-05 MED ORDER — KETOROLAC TROMETHAMINE 30 MG/ML IJ SOLN
60.0000 mg | Freq: Once | INTRAMUSCULAR | Status: AC
  Administered 2015-07-05: 60 mg via INTRAVENOUS
  Filled 2015-07-05: qty 2

## 2015-07-05 MED ORDER — HYDROMORPHONE HCL 1 MG/ML IJ SOLN
1.0000 mg | Freq: Once | INTRAMUSCULAR | Status: AC
  Administered 2015-07-05: 1 mg via INTRAVENOUS
  Filled 2015-07-05: qty 1

## 2015-07-05 MED ORDER — ONDANSETRON HCL 4 MG/2ML IJ SOLN
INTRAMUSCULAR | Status: AC
Start: 2015-07-05 — End: 2015-07-05
  Administered 2015-07-05: 4 mg via INTRAVENOUS
  Filled 2015-07-05: qty 2

## 2015-07-05 MED ORDER — MORPHINE SULFATE (PF) 2 MG/ML IV SOLN
INTRAVENOUS | Status: AC
  Administered 2015-07-05: 2 mg via INTRAVENOUS
  Filled 2015-07-05: qty 1

## 2015-07-05 MED ORDER — KETOROLAC TROMETHAMINE 10 MG PO TABS: 10.0000 mg | ORAL_TABLET | Freq: Three times a day (TID) | ORAL | Status: DC

## 2015-07-05 MED ORDER — ONDANSETRON HCL 4 MG/2ML IJ SOLN
4.0000 mg | Freq: Once | INTRAMUSCULAR | Status: AC
  Administered 2015-07-05: 4 mg via INTRAVENOUS

## 2015-07-05 MED ORDER — KETOROLAC TROMETHAMINE 30 MG/ML IJ SOLN
INTRAMUSCULAR | Status: AC
  Administered 2015-07-05: 60 mg via INTRAVENOUS
  Filled 2015-07-05: qty 1

## 2015-07-05 NOTE — ED Notes (Signed)
AAOx3.  Skin warm and dry.  Ambulates with easy and steady gait.  Posture upright and relaxed. 

## 2015-07-05 NOTE — ED Notes (Signed)
AAOx3.  Skin warm and dry.  NAD 

## 2015-07-05 NOTE — ED Notes (Signed)
Pt states back pain, chills, sweating, nausea, and cloudy urine for about a week
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
## 2015-07-05 NOTE — ED Notes (Signed)
Vomited moderate amount emesis.

## 2015-07-05 NOTE — Discharge Instructions (Signed)
Back Pain, Adult °Low back pain is very common. About 1 in 5 people have back pain. The cause of low back pain is rarely dangerous. The pain often gets better over time. About half of people with a sudden onset of back pain feel better in just 2 weeks. About 8 in 10 people feel better by 6 weeks.  °CAUSES °Some common causes of back pain include: °· Strain of the muscles or ligaments supporting the spine. °· Wear and tear (degeneration) of the spinal discs. °· Arthritis. °· Direct injury to the back. °DIAGNOSIS °Most of the time, the direct cause of low back pain is not known. However, back pain can be treated effectively even when the exact cause of the pain is unknown. Answering your caregiver's questions about your overall health and symptoms is one of the most accurate ways to make sure the cause of your pain is not dangerous. If your caregiver needs more information, he or she may order lab work or imaging tests (X-rays or MRIs). However, even if imaging tests show changes in your back, this usually does not require surgery. °HOME CARE INSTRUCTIONS °For many people, back pain returns. Since low back pain is rarely dangerous, it is often a condition that people can learn to manage on their own.  °· Remain active. It is stressful on the back to sit or stand in one place. Do not sit, drive, or stand in one place for more than 30 minutes at a time. Take short walks on level surfaces as soon as pain allows. Try to increase the length of time you walk each day. °· Do not stay in bed. Resting more than 1 or 2 days can delay your recovery. °· Do not avoid exercise or work. Your body is made to move. It is not dangerous to be active, even though your back may hurt. Your back will likely heal faster if you return to being active before your pain is gone. °· Pay attention to your body when you  bend and lift. Many people have less discomfort when lifting if they bend their knees, keep the load close to their bodies, and  avoid twisting. Often, the most comfortable positions are those that put less stress on your recovering back. °· Find a comfortable position to sleep. Use a firm mattress and lie on your side with your knees slightly bent. If you lie on your back, put a pillow under your knees. °· Only take over-the-counter or prescription medicines as directed by your caregiver. Over-the-counter medicines to reduce pain and inflammation are often the most helpful. Your caregiver may prescribe muscle relaxant drugs. These medicines help dull your pain so you can more quickly return to your normal activities and healthy exercise. °· Put ice on the injured area. °¨ Put ice in a plastic bag. °¨ Place a towel between your skin and the bag. °¨ Leave the ice on for 15-20 minutes, 03-04 times a day for the first 2 to 3 days. After that, ice and heat may be alternated to reduce pain and spasms. °· Ask your caregiver about trying back exercises and gentle massage. This may be of some benefit. °· Avoid feeling anxious or stressed. Stress increases muscle tension and can worsen back pain. It is important to recognize when you are anxious or stressed and learn ways to manage it. Exercise is a great option. °SEEK MEDICAL CARE IF: °· You have pain that is not relieved with rest or medicine. °· You have pain that does not improve in 1 week. °· You have new symptoms. °· You are generally not feeling well. °SEEK   IMMEDIATE MEDICAL CARE IF:   You have pain that radiates from your back into your legs.  You develop new bowel or bladder control problems.  You have unusual weakness or numbness in your arms or legs.  You develop nausea or vomiting.  You develop abdominal pain.  You feel faint. Document Released: 10/03/2005 Document Revised: 04/03/2012 Document Reviewed: 02/04/2014 [facility] Patient Information 2015 [place], [place]. This information is not intended to replace advice given to you by your health care provider. Make sure you  discuss any questions you have with your health care provider.  Your labs and CT scan are normal today. There is no sign of obstructing kidney stone or infection. You should follow-up with your providers as scheduled.  Take the prescription meds as needed.

## 2015-07-05 NOTE — ED Provider Notes (Signed)
-----------------------------------------   9:38 PM on 07/05/2015 -----------------------------------------   Blood pressure 154/91, pulse 92, temperature 98.3 F (36.8 C), temperature source Oral, resp. rate 18, height 6' (1.829 m), weight 280 lb (127.007 kg), last menstrual period 06/25/2015, SpO2 100 %.  Assuming care from [facility], PA-C.  In short, [name] is a 39 y.o. female with a chief complaint of Back Pain  Refer to the original H&P for additional details.  Labs Reviewed  URINALYSIS COMPLETEWITH MICROSCOPIC ([facility] ONLY) - Abnormal; Notable for the following:    Color, Urine YELLOW (*)    APPearance CLOUDY (*)    Leukocytes, UA 3+ (*)    Bacteria, UA RARE (*)    Squamous Epithelial / LPF 6-30 (*)    All other components within normal limits  CBC - Abnormal; Notable for the following:    WBC 12.3 (*)    RBC 5.47 (*)    MCV 76.7 (*)    MCH 25.4 (*)    RDW 15.4 (*)    All other components within normal limits  PREGNANCY, URINE  BASIC METABOLIC PANEL  POC URINE PREG, ED   CT Renal   FINDINGS: Kidneys, ureters, bladder: No ureteral stone or hydronephrosis. Small nonobstructing stones are noted in each kidney. No renal masses. Ureters normal course and in caliber. Bladder is unremarkable.  The current plan of care is to patient will be discharged home with prescriptions for ketorolac, tramadol, and Zofran. We have reviewed labs, lumbar films, and CT results. No indication of obstructing stone, infection, or acute musculoskeletal findings. She is to follow-up as scheduled with her chiropractor, as well as her orthopedic provider at [facility], who is managing her for a concurrent foot fracture. She is awaiting referral to orthopedics spine, from the chiropractor, for evaluation following slow response to treatment. She verbalizes understanding of the treatment plan and is in agreement. She rates her pain at a 5 out of 10 currently, which is improved from  triage.    Dannielle Karvonen [place], PA-C 07/05/15 [place], MD 07/05/15 2226

## 2015-07-23 DIAGNOSIS — F3170 Bipolar disorder, currently in remission, most recent episode unspecified: Secondary | ICD-10-CM

## 2015-07-23 DIAGNOSIS — F319 Bipolar disorder, unspecified: Secondary | ICD-10-CM | POA: Insufficient documentation

## 2015-07-23 DIAGNOSIS — Z720 Tobacco use: Secondary | ICD-10-CM

## 2015-07-24 ENCOUNTER — Encounter: Payer: Self-pay | Admitting: Unknown Physician Specialty

## 2015-07-24 ENCOUNTER — Ambulatory Visit (INDEPENDENT_AMBULATORY_CARE_PROVIDER_SITE_OTHER): Payer: BC Managed Care – PPO | Admitting: Unknown Physician Specialty

## 2015-07-24 VITALS — BP 134/79 | HR 103 | Temp 98.4°F | Ht 68.7 in | Wt 265.0 lb

## 2015-07-24 DIAGNOSIS — E669 Obesity, unspecified: Secondary | ICD-10-CM

## 2015-07-24 DIAGNOSIS — Z5181 Encounter for therapeutic drug level monitoring: Secondary | ICD-10-CM | POA: Diagnosis not present

## 2015-07-24 DIAGNOSIS — R590 Localized enlarged lymph nodes: Secondary | ICD-10-CM | POA: Diagnosis not present

## 2015-07-24 DIAGNOSIS — N912 Amenorrhea, unspecified: Secondary | ICD-10-CM | POA: Diagnosis not present

## 2015-07-24 DIAGNOSIS — Z23 Encounter for immunization: Secondary | ICD-10-CM | POA: Diagnosis not present

## 2015-07-24 DIAGNOSIS — R0602 Shortness of breath: Secondary | ICD-10-CM

## 2015-07-24 DIAGNOSIS — K589 Irritable bowel syndrome without diarrhea: Secondary | ICD-10-CM
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
## 2015-07-25 LAB — COMPREHENSIVE METABOLIC PANEL
ALT: 18 IU/L (ref 0–32)
AST: 20 IU/L (ref 0–40)
Albumin/Globulin Ratio: 1.9 (ref 1.1–2.5)
Albumin: 4.8 g/dL (ref 3.5–5.5)
Alkaline Phosphatase: 69 IU/L (ref 39–117)
BILIRUBIN TOTAL: 0.2 mg/dL (ref 0.0–1.2)
BUN/Creatinine Ratio: 15 (ref 8–20)
BUN: 12 mg/dL (ref 6–20)
CHLORIDE: 98 mmol/L (ref 97–108)
CO2: 21 mmol/L (ref 18–29)
Calcium: 10.1 mg/dL (ref 8.7–10.2)
Creatinine, Ser: 0.8 mg/dL (ref 0.57–1.00)
GFR calc non Af Amer: 94 mL/min/{1.73_m2} (ref >59)
GFR, EST AFRICAN AMERICAN: 109 mL/min/{1.73_m2} (ref >59)
GLUCOSE: 109 mg/dL — AB (ref 65–99)
Globulin, Total: 2.5 g/dL (ref 1.5–4.5)
POTASSIUM: 4.2 mmol/L (ref 3.5–5.2)
Sodium: 141 mmol/L (ref 134–144)
TOTAL PROTEIN: 7.3 g/dL (ref 6.0–8.5)

## 2015-07-25 LAB — CBC WITH DIFFERENTIAL/PLATELET
BASOS ABS: 0 10*3/uL (ref 0.0–0.2)
Basos: 0 %
EOS (ABSOLUTE): 0.2 10*3/uL (ref 0.0–0.4)
Eos: 1 %
Hematocrit: 40.7 % (ref 34.0–46.6)
Hemoglobin: 13.3 g/dL (ref 11.1–15.9)
IMMATURE GRANULOCYTES: 1 %
Immature Grans (Abs): 0.1 10*3/uL (ref 0.0–0.1)
Lymphocytes Absolute: 3.6 10*3/uL — ABNORMAL HIGH (ref 0.7–3.1)
Lymphs: 26 %
MCH: 25.6 pg — AB (ref 26.6–33.0)
MCHC: 32.7 g/dL (ref 31.5–35.7)
MCV: 78 fL — ABNORMAL LOW (ref 79–97)
MONOS ABS: 1.2 10*3/uL — AB (ref 0.1–0.9)
Monocytes: 9 %
NEUTROS PCT: 63 %
Neutrophils Absolute: 8.5 10*3/uL — ABNORMAL HIGH (ref 1.4–7.0)
PLATELETS: 247 10*3/uL (ref 150–379)
RBC: 5.19 x10E6/uL (ref 3.77–5.28)
RDW: 14.9 % (ref 12.3–15.4)
WBC: 13.5 10*3/uL — ABNORMAL HIGH (ref 3.4–10.8)

## 2015-07-25 LAB — HCG, SERUM, QUALITATIVE: hCG,Beta Subunit,Qual,Serum: NEGATIVE m[IU]/mL (ref <6)

## 2015-07-26 LAB — TISSUE TRANSGLUTAMINASE, IGA: Transglutaminase IgA: <2 U/mL (ref 0–3)

## 2015-07-28 ENCOUNTER — Ambulatory Visit: Payer: Self-pay | Admitting: Unknown Physician Specialty

## 2015-07-28 ENCOUNTER — Other Ambulatory Visit: Payer: BC Managed Care – PPO

## 2015-07-28 ENCOUNTER — Ambulatory Visit
Admission: RE | Admit: 2015-07-28 | Discharge: 2015-07-28 | Disposition: A | Discharge status: Home / Self Care | Payer: BC Managed Care – PPO | Source: Ambulatory Visit | Attending: Unknown Physician Specialty | Admitting: Unknown Physician Specialty

## 2015-07-28 DIAGNOSIS — R590 Localized enlarged lymph nodes: Secondary | ICD-10-CM

## 2015-07-28 DIAGNOSIS — R0602 Shortness of breath: Secondary | ICD-10-CM

## 2015-07-28 LAB — LAMOTRIGINE LEVEL: Lamotrigine Lvl: 2.2 ug/mL (ref 2.0–20.0)

## 2015-07-29 ENCOUNTER — Ambulatory Visit: Payer: BC Managed Care – PPO | Admitting: Unknown Physician Specialty

## 2015-07-29 ENCOUNTER — Telehealth: Payer: Self-pay | Admitting: Unknown Physician Specialty

## 2015-07-29 ENCOUNTER — Ambulatory Visit
Admission: RE | Admit: 2015-07-29 | Discharge: 2015-07-29 | Disposition: A | Discharge status: Home / Self Care | Payer: BC Managed Care – PPO | Source: Ambulatory Visit | Attending: Unknown Physician Specialty | Admitting: Unknown Physician Specialty

## 2015-07-29 DIAGNOSIS — R590 Localized enlarged lymph nodes: Secondary | ICD-10-CM | POA: Diagnosis present

## 2015-07-29 DIAGNOSIS — N200 Calculus of kidney: Secondary | ICD-10-CM | POA: Insufficient documentation

## 2015-07-29 DIAGNOSIS — D72829 Elevated white blood cell count, unspecified: Secondary | ICD-10-CM

## 2015-07-29 DIAGNOSIS — N83202 Unspecified ovarian cyst, left side: Secondary | ICD-10-CM | POA: Diagnosis not present

## 2015-07-29 HISTORY — DX: Systemic involvement of connective tissue, unspecified: M35.9

## 2015-07-29 MED ORDER — IOHEXOL 300 MG/ML  SOLN
100.0000 mL | Freq: Once | INTRAMUSCULAR | Status: AC | PRN
  Administered 2015-07-29: 100 mL via INTRAVENOUS

## 2015-07-29 NOTE — Telephone Encounter (Signed)
Pt would like to repeat her CBC tomorrow while off work.  I will put the order on and review on Friday

## 2015-07-30 ENCOUNTER — Other Ambulatory Visit: Payer: BC Managed Care – PPO

## 2015-07-30 DIAGNOSIS — D72829 Elevated white blood cell count, unspecified: Secondary | ICD-10-CM

## 2015-07-31 LAB — CBC WITH DIFFERENTIAL/PLATELET
BASOS ABS: 0 10*3/uL (ref 0.0–0.2)
BASOS: 0 %
EOS (ABSOLUTE): 0.2 10*3/uL (ref 0.0–0.4)
Eos: 2 %
HEMATOCRIT: 38.4 % (ref 34.0–46.6)
HEMOGLOBIN: 12.2 g/dL (ref 11.1–15.9)
IMMATURE GRANS (ABS): 0 10*3/uL (ref 0.0–0.1)
Immature Granulocytes: 0 %
LYMPHS ABS: 2.3 10*3/uL (ref 0.7–3.1)
LYMPHS: 29 %
MCH: 24.7 pg — AB (ref 26.6–33.0)
MCHC: 31.8 g/dL (ref 31.5–35.7)
MCV: 78 fL — ABNORMAL LOW (ref 79–97)
MONOCYTES: 7 %
Monocytes Absolute: 0.5 10*3/uL (ref 0.1–0.9)
NEUTROS ABS: 4.8 10*3/uL (ref 1.4–7.0)
NEUTROS PCT: 62 %
Platelets: 200 10*3/uL (ref 150–379)
RBC: 4.93 x10E6/uL (ref 3.77–5.28)
RDW: 15.2 % (ref 12.3–15.4)
WBC: 7.8 10*3/uL (ref 3.4–10.8)

## 2015-08-05 ENCOUNTER — Ambulatory Visit: Payer: BC Managed Care – PPO | Admitting: Unknown Physician Specialty

## 2015-08-05 ENCOUNTER — Encounter: Payer: Self-pay | Admitting: Unknown Physician Specialty

## 2015-08-05 VITALS — BP 138/86 | HR 97 | Temp 98.4°F | Ht 69.7 in | Wt 273.2 lb

## 2015-08-05 NOTE — Progress Notes (Signed)
Pt is here to review labs which came back normal.  Will put down for a no charge visit

## 2015-10-18 DIAGNOSIS — I219 Acute myocardial infarction, unspecified: Secondary | ICD-10-CM

## 2015-10-18 HISTORY — DX: Acute myocardial infarction, unspecified: I21.9

## 2015-10-29 ENCOUNTER — Emergency Department: Payer: BC Managed Care – PPO

## 2015-10-29 ENCOUNTER — Inpatient Hospital Stay
Admission: EM | Admit: 2015-10-29 | Discharge: 2015-10-30 | DRG: 392 | Disposition: A | Discharge status: Home / Self Care | Payer: BC Managed Care – PPO | Attending: Internal Medicine | Admitting: Internal Medicine

## 2015-10-29 DIAGNOSIS — I214 Non-ST elevation (NSTEMI) myocardial infarction: Secondary | ICD-10-CM

## 2015-10-29 DIAGNOSIS — R079 Chest pain, unspecified: Secondary | ICD-10-CM | POA: Diagnosis present

## 2015-10-29 DIAGNOSIS — Z7902 Long term (current) use of antithrombotics/antiplatelets: Secondary | ICD-10-CM

## 2015-10-29 DIAGNOSIS — F319 Bipolar disorder, unspecified: Secondary | ICD-10-CM | POA: Diagnosis present

## 2015-10-29 DIAGNOSIS — Z6837 Body mass index (BMI) 37.0-37.9, adult: Secondary | ICD-10-CM

## 2015-10-29 DIAGNOSIS — R748 Abnormal levels of other serum enzymes: Secondary | ICD-10-CM | POA: Diagnosis present

## 2015-10-29 DIAGNOSIS — Z881 Allergy status to other antibiotic agents status: Secondary | ICD-10-CM | POA: Diagnosis not present

## 2015-10-29 DIAGNOSIS — E669 Obesity, unspecified: Secondary | ICD-10-CM | POA: Diagnosis present

## 2015-10-29 DIAGNOSIS — Z7982 Long term (current) use of aspirin: Secondary | ICD-10-CM

## 2015-10-29 DIAGNOSIS — M1990 Unspecified osteoarthritis, unspecified site: Secondary | ICD-10-CM | POA: Diagnosis present

## 2015-10-29 DIAGNOSIS — Z79899 Other long term (current) drug therapy: Secondary | ICD-10-CM

## 2015-10-29 DIAGNOSIS — F17210 Nicotine dependence, cigarettes, uncomplicated: Secondary | ICD-10-CM | POA: Diagnosis present

## 2015-10-29 DIAGNOSIS — K219 Gastro-esophageal reflux disease without esophagitis: Secondary | ICD-10-CM | POA: Diagnosis present

## 2015-10-29 LAB — CBC
HEMATOCRIT: 38.1 % (ref 35.0–47.0)
HEMOGLOBIN: 12.4 g/dL (ref 12.0–16.0)
MCH: 24.9 pg — ABNORMAL LOW (ref 26.0–34.0)
MCHC: 32.6 g/dL (ref 32.0–36.0)
MCV: 76.5 fL — ABNORMAL LOW (ref 80.0–100.0)
Platelets: 168 10*3/uL (ref 150–440)
RBC: 4.98 MIL/uL (ref 3.80–5.20)
RDW: 15.6 % — ABNORMAL HIGH (ref 11.5–14.5)
WBC: 7.4 10*3/uL (ref 3.6–11.0)

## 2015-10-29 LAB — HEPARIN LEVEL (UNFRACTIONATED): HEPARIN UNFRACTIONATED: 0.12 [IU]/mL — AB (ref 0.30–0.70)

## 2015-10-29 LAB — BASIC METABOLIC PANEL
ANION GAP: 7 (ref 5–15)
BUN: 17 mg/dL (ref 6–20)
CALCIUM: 10 mg/dL (ref 8.9–10.3)
CO2: 24 mmol/L (ref 22–32)
Chloride: 107 mmol/L (ref 101–111)
Creatinine, Ser: 0.73 mg/dL (ref 0.44–1.00)
GLUCOSE: 131 mg/dL — AB (ref 65–99)
Potassium: 4.2 mmol/L (ref 3.5–5.1)
Sodium: 138 mmol/L (ref 135–145)

## 2015-10-29 LAB — APTT: aPTT: 29 seconds (ref 24–36)

## 2015-10-29 LAB — TROPONIN I
TROPONIN I: 0.4 ng/mL — AB (ref <0.031)
TROPONIN I: 0.37 ng/mL — AB (ref <0.031)
Troponin I: 0.4 ng/mL — ABNORMAL HIGH (ref <0.031)

## 2015-10-29 LAB — PROTIME-INR
INR: 0.99
Prothrombin Time: 13.3 seconds (ref 11.4–15.0)

## 2015-10-29 LAB — GLUCOSE, CAPILLARY: GLUCOSE-CAPILLARY: 132 mg/dL — AB (ref 65–99)

## 2015-10-29 MED ORDER — LAMOTRIGINE 100 MG PO TABS: 200.0000 mg | ORAL_TABLET | Freq: Every day | ORAL | Status: DC

## 2015-10-29 MED ORDER — LAMOTRIGINE 100 MG PO TABS: 400.0000 mg | ORAL_TABLET | Freq: Every day | ORAL | Status: DC

## 2015-10-29 MED ORDER — QUETIAPINE FUMARATE 300 MG PO TABS
300.0000 mg | ORAL_TABLET | Freq: Every day | ORAL | Status: DC
  Administered 2015-10-29: 300 mg via ORAL
  Filled 2015-10-29 (×4): qty 1

## 2015-10-29 MED ORDER — LAMOTRIGINE 100 MG PO TABS
200.0000 mg | ORAL_TABLET | Freq: Every day | ORAL | Status: DC
  Filled 2015-10-29: qty 2

## 2015-10-29 MED ORDER — METOPROLOL TARTRATE 25 MG PO TABS
25.0000 mg | ORAL_TABLET | Freq: Two times a day (BID) | ORAL | Status: DC
Start: 2015-10-29 — End: 2015-10-30
  Administered 2015-10-29 – 2015-10-30 (×2): 25 mg via ORAL
  Filled 2015-10-29 (×2): qty 1

## 2015-10-29 MED ORDER — NITROGLYCERIN 0.4 MG SL SUBL
0.4000 mg | SUBLINGUAL_TABLET | SUBLINGUAL | Status: DC | PRN
  Filled 2015-10-29: qty 1

## 2015-10-29 MED ORDER — ONDANSETRON HCL 4 MG PO TABS: 4.0000 mg | ORAL_TABLET | Freq: Four times a day (QID) | ORAL | Status: DC | PRN

## 2015-10-29 MED ORDER — LAMOTRIGINE 25 MG PO TABS
350.0000 mg | ORAL_TABLET | Freq: Once | ORAL | Status: AC
  Administered 2015-10-29: 350 mg via ORAL
  Filled 2015-10-29: qty 2

## 2015-10-29 MED ORDER — CLONAZEPAM 1 MG PO TABS
2.0000 mg | ORAL_TABLET | Freq: Every day | ORAL | Status: DC
  Administered 2015-10-29: 2 mg via ORAL
  Filled 2015-10-29: qty 2

## 2015-10-29 MED ORDER — QUETIAPINE FUMARATE ER 50 MG PO TB24
150.0000 mg | ORAL_TABLET | Freq: Every day | ORAL | Status: DC
  Filled 2015-10-29 (×2): qty 3

## 2015-10-29 MED ORDER — NITROGLYCERIN 0.4 MG SL SUBL
0.4000 mg | SUBLINGUAL_TABLET | SUBLINGUAL | Status: DC | PRN
  Administered 2015-10-29 (×2): 0.4 mg via SUBLINGUAL

## 2015-10-29 MED ORDER — HEPARIN (PORCINE) IN NACL 100-0.45 UNIT/ML-% IJ SOLN
1700.0000 [IU]/h | INTRAMUSCULAR | Status: DC
Start: 2015-10-29 — End: 2015-10-30
  Administered 2015-10-29: 1350 [IU]/h via INTRAVENOUS
  Administered 2015-10-30: 1700 [IU]/h via INTRAVENOUS
  Filled 2015-10-29 (×3): qty 250

## 2015-10-29 MED ORDER — ASPIRIN 81 MG PO CHEW
81.0000 mg | CHEWABLE_TABLET | Freq: Every day | ORAL | Status: DC
  Administered 2015-10-29: 81 mg via ORAL
  Filled 2015-10-29: qty 1

## 2015-10-29 MED ORDER — TRAMADOL HCL 50 MG PO TABS
50.0000 mg | ORAL_TABLET | Freq: Two times a day (BID) | ORAL | Status: DC
  Administered 2015-10-29 – 2015-10-30 (×2): 50 mg via ORAL
  Filled 2015-10-29 (×2): qty 1

## 2015-10-29 MED ORDER — ENOXAPARIN SODIUM 40 MG/0.4ML ~~LOC~~ SOLN: 40.0000 mg | SUBCUTANEOUS | Status: DC

## 2015-10-29 MED ORDER — KETOROLAC TROMETHAMINE 10 MG PO TABS
10.0000 mg | ORAL_TABLET | Freq: Three times a day (TID) | ORAL | Status: DC
  Filled 2015-10-29 (×2): qty 1

## 2015-10-29 MED ORDER — CLONAZEPAM 1 MG PO TABS
1.0000 mg | ORAL_TABLET | Freq: Two times a day (BID) | ORAL | Status: DC
  Filled 2015-10-29: qty 1

## 2015-10-29 MED ORDER — HEPARIN BOLUS VIA INFUSION
4000.0000 [IU] | Freq: Once | INTRAVENOUS | Status: AC
  Administered 2015-10-29: 4000 [IU] via INTRAVENOUS
  Filled 2015-10-29: qty 4000

## 2015-10-29 MED ORDER — ASPIRIN 81 MG PO CHEW
324.0000 mg | CHEWABLE_TABLET | Freq: Once | ORAL | Status: AC
  Administered 2015-10-29: 324 mg via ORAL
  Filled 2015-10-29: qty 4

## 2015-10-29 MED ORDER — CYCLOBENZAPRINE HCL 10 MG PO TABS: 5.0000 mg | ORAL_TABLET | Freq: Three times a day (TID) | ORAL | Status: DC | PRN
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
## 2015-10-29 NOTE — Progress Notes (Signed)
[name] is a 40 y.o. female patient admitted from ED awake, alert - oriented  X 4 - no acute distress noted.  VSS - Blood pressure 145/68, pulse 92, temperature 97.8 F (36.6 C), temperature source Oral, resp. rate 16, height 6' (1.829 m), weight 127.007 kg (280 lb), last menstrual period 10/13/2015, SpO2 99 %.    IV in place, occlusive dsg intact without redness.  Orientation to room, and floor completed with information packet given to patient/family. Admission INP armband ID verified with patient/family, and in place.   SR up x 2, fall assessment complete, with patient and family able to verbalize understanding of risk associated with falls, and verbalized understanding to call nsg before up out of bed.  Call light within reach, patient able to voice, and demonstrate understanding.  Skin, clean-dry- intact without evidence of bruising, or skin tears.   No evidence of skin break down noted on exam. Skin assessed with Tammy T. Tele box verified with RN and [place], NT.      Will cont to eval and treat per MD orders.  Horton Finer, RN 10/29/2015 4:59 PM
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
## 2015-10-29 NOTE — ED Notes (Signed)
Pt c/o centralized CP and SOB X 1 week. Pt describes CP as constant and burning. Pt alert and oriented X4, active, cooperative, pt in NAD. RR even and unlabored, color WNL.
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
## 2015-10-29 NOTE — Plan of Care (Signed)
Problem: Safety: Goal: Ability to remain free from injury will improve Outcome: Completed/Met Date Met:  10/29/15 Patient low fall risk, patient independent in the room.

## 2015-10-30 LAB — TROPONIN I: TROPONIN I: 0.41 ng/mL — AB (ref <0.031)

## 2015-10-30 LAB — CBC
HEMATOCRIT: 37.3 % (ref 35.0–47.0)
Hemoglobin: 12.3 g/dL (ref 12.0–16.0)
MCH: 25.3 pg — AB (ref 26.0–34.0)
MCHC: 32.9 g/dL (ref 32.0–36.0)
MCV: 76.9 fL — AB (ref 80.0–100.0)
Platelets: 170 10*3/uL (ref 150–440)
RBC: 4.86 MIL/uL (ref 3.80–5.20)
RDW: 15.6 % — AB (ref 11.5–14.5)
WBC: 9.3 10*3/uL (ref 3.6–11.0)

## 2015-10-30 LAB — HEPARIN LEVEL (UNFRACTIONATED): Heparin Unfractionated: 0.29 IU/mL — ABNORMAL LOW (ref 0.30–0.70)

## 2015-10-30 MED ORDER — HEPARIN BOLUS VIA INFUSION
1500.0000 [IU] | Freq: Once | INTRAVENOUS | Status: AC
  Administered 2015-10-30: 1500 [IU] via INTRAVENOUS
  Filled 2015-10-30: qty 1500

## 2015-10-30 MED ORDER — CLOPIDOGREL BISULFATE 75 MG PO TABS: 75.0000 mg | ORAL_TABLET | Freq: Every day | ORAL | Status: DC

## 2015-10-30 MED ORDER — PANTOPRAZOLE SODIUM 40 MG PO TBEC: 40.0000 mg | DELAYED_RELEASE_TABLET | Freq: Two times a day (BID) | ORAL | Status: DC

## 2015-10-30 MED ORDER — CLOPIDOGREL BISULFATE 75 MG PO TABS
300.0000 mg | ORAL_TABLET | Freq: Once | ORAL | Status: AC
  Administered 2015-10-30: 300 mg via ORAL
  Filled 2015-10-30: qty 4

## 2015-10-30 MED ORDER — LAMOTRIGINE 100 MG PO TABS: 400.0000 mg | ORAL_TABLET | Freq: Every day | ORAL | Status: DC

## 2015-10-30 MED ORDER — HEPARIN (PORCINE) IN NACL 100-0.45 UNIT/ML-% IJ SOLN
1900.0000 [IU]/h | INTRAMUSCULAR | Status: DC
  Administered 2015-10-30: 1900 [IU]/h via INTRAVENOUS
  Filled 2015-10-30 (×2): qty 250

## 2015-10-30 MED ORDER — METOPROLOL TARTRATE 25 MG PO TABS: 25.0000 mg | ORAL_TABLET | Freq: Two times a day (BID) | ORAL | Status: DC

## 2015-10-30 MED ORDER — ASPIRIN 81 MG PO CHEW: 81.0000 mg | CHEWABLE_TABLET | Freq: Every day | ORAL | Status: DC

## 2015-10-30 MED ORDER — ISOSORBIDE MONONITRATE ER 30 MG PO TB24: 30.0000 mg | ORAL_TABLET | Freq: Every day | ORAL | Status: DC

## 2015-10-30 MED ORDER — PANTOPRAZOLE SODIUM 40 MG PO TBEC
40.0000 mg | DELAYED_RELEASE_TABLET | Freq: Every day | ORAL | Status: DC
  Administered 2015-10-30: 40 mg via ORAL
  Filled 2015-10-30: qty 1

## 2015-10-30 MED ORDER — NITROGLYCERIN 0.4 MG SL SUBL: 0.4000 mg | SUBLINGUAL_TABLET | SUBLINGUAL | Status: DC | PRN

## 2015-10-30 MED ORDER — HEPARIN BOLUS VIA INFUSION
3100.0000 [IU] | Freq: Once | INTRAVENOUS | Status: AC
  Administered 2015-10-30: 3100 [IU] via INTRAVENOUS
  Filled 2015-10-30: qty 3100

## 2015-10-30 MED ORDER — ISOSORBIDE MONONITRATE ER 30 MG PO TB24
30.0000 mg | ORAL_TABLET | Freq: Every day | ORAL | Status: DC
  Administered 2015-10-30: 30 mg via ORAL
  Filled 2015-10-30: qty 1
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
## 2015-10-30 NOTE — Progress Notes (Signed)
Patient alert and oriented, pain free, family at bedside.  New Rx and f/u appointments given.  Patient able to repeat back information and has no questions at this time.  Given education on new Rx.    Removed telemery and removed PIV.  To be escorted out of hospital via wheelchair by volunteers.
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
## 2015-12-06 ENCOUNTER — Ambulatory Visit
Admission: EM | Admit: 2015-12-06 | Discharge: 2015-12-06 | Disposition: A | Discharge status: Home / Self Care | Payer: BC Managed Care – PPO | Attending: Family Medicine | Admitting: Family Medicine

## 2015-12-06 DIAGNOSIS — J209 Acute bronchitis, unspecified: Secondary | ICD-10-CM | POA: Diagnosis not present

## 2015-12-06 LAB — RAPID INFLUENZA A&B ANTIGENS (ARMC ONLY)
INFLUENZA A (ARMC): NOT DETECTED
INFLUENZA B (ARMC): NOT DETECTED

## 2015-12-06 MED ORDER — ALBUTEROL SULFATE HFA 108 (90 BASE) MCG/ACT IN AERS: 2.0000 | INHALATION_SPRAY | Freq: Four times a day (QID) | RESPIRATORY_TRACT | Status: DC | PRN

## 2015-12-06 MED ORDER — HYDROCOD POLST-CPM POLST ER 10-8 MG/5ML PO SUER: 5.0000 mL | Freq: Two times a day (BID) | ORAL | Status: DC | PRN

## 2015-12-06 MED ORDER — AZITHROMYCIN 500 MG PO TABS
ORAL_TABLET | ORAL | Status: DC
Start: 2015-12-06 — End: 2016-05-25
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
## 2015-12-06 NOTE — ED Notes (Signed)
Pt c/o chest congestion, SOB, productive cough with green sputum, body aches, sweats x 1 week. Pt reports she gets bronchitis around this time every year, and this feels similar.

## 2015-12-06 NOTE — Discharge Instructions (Signed)

## 2016-05-25 ENCOUNTER — Encounter: Payer: Self-pay | Admitting: Family Medicine

## 2016-05-25 ENCOUNTER — Ambulatory Visit (INDEPENDENT_AMBULATORY_CARE_PROVIDER_SITE_OTHER): Payer: BC Managed Care – PPO | Admitting: Family Medicine

## 2016-05-25 ENCOUNTER — Other Ambulatory Visit: Payer: Self-pay

## 2016-05-25 VITALS — BP 135/84 | HR 99 | Temp 98.5°F | Wt 282.0 lb

## 2016-05-25 DIAGNOSIS — J069 Acute upper respiratory infection, unspecified: Secondary | ICD-10-CM

## 2016-05-25 MED ORDER — BENZONATATE 100 MG PO CAPS: 100.0000 mg | ORAL_CAPSULE | Freq: Two times a day (BID) | ORAL | 0 refills | Status: DC | PRN

## 2016-05-25 MED ORDER — LIDOCAINE VISCOUS 2 % MT SOLN: 5.0000 mL | OROMUCOSAL | 0 refills | Status: DC | PRN

## 2016-05-25 MED ORDER — ALBUTEROL SULFATE HFA 108 (90 BASE) MCG/ACT IN AERS: 2.0000 | INHALATION_SPRAY | Freq: Four times a day (QID) | RESPIRATORY_TRACT | 0 refills | Status: DC | PRN

## 2016-05-25 MED ORDER — FLUTICASONE PROPIONATE 50 MCG/ACT NA SUSP: 2.0000 | Freq: Every day | NASAL | 6 refills | Status: DC

## 2016-05-25 MED ORDER — CETIRIZINE HCL 10 MG PO TABS: 10.0000 mg | ORAL_TABLET | Freq: Every day | ORAL | 11 refills | Status: DC

## 2016-05-25 NOTE — Patient Instructions (Signed)
Follow up as needed

## 2016-05-25 NOTE — Progress Notes (Signed)
   BP 135/84   Pulse 99   Temp 98.5 F (36.9 C)   Wt 282 lb (127.9 kg)   LMP 05/04/2016 (Approximate)   SpO2 97%   BMI 38.25 kg/m    Subjective:    Patient ID: [name], female    DOB: 1976-10-10, 40 y.o.   MRN: [id number]  HPI: [name] is a 40 y.o. female  Chief Complaint  Patient presents with  . URI    started Saturday with mucus, chest and head congestion, sore throat, productive cough, some fever possibly. Taken Mucinex.   Patient presents with 4 day history of congestion, sore throat, and productive cough. Has some wheezing and SOB as well. Denies ear pain, sinus pressure, or N/V/D. Unsure if she's had a fever. Has been taking otc mucinex with no relief, and using albuterol for wheezing that she had leftover from last year's seasonal cold. States she gets the same thing every year at this time.  Relevant past medical, surgical, family and social history reviewed and updated as indicated. Interim medical history since our last visit reviewed. Allergies and medications reviewed and updated.  Review of Systems  Constitutional: Negative.   HENT: Positive for congestion, postnasal drip, rhinorrhea and sneezing.   Eyes: Negative.   Respiratory: Positive for cough, shortness of breath and wheezing.   Cardiovascular: Negative.   Gastrointestinal: Negative.   Musculoskeletal: Negative.   Neurological: Negative.   Psychiatric/Behavioral: Negative.     Per HPI unless specifically indicated above     Objective:    BP 135/84   Pulse 99   Temp 98.5 F (36.9 C)   Wt 282 lb (127.9 kg)   LMP 05/04/2016 (Approximate)   SpO2 97%   BMI 38.25 kg/m   Wt Readings from Last 3 Encounters:  05/25/16 282 lb (127.9 kg)  12/06/15 280 lb (127 kg)  10/29/15 280 lb (127 kg)    Physical Exam  Constitutional: She is oriented to person, place, and time. She appears well-developed and well-nourished.  HENT:  Head: Atraumatic.  Oropharynx erythematous Nasal  turbinates boggy B/l TMs with mild effusion, no erythema or purulence.  Neck: Neck supple.  Cardiovascular: Normal rate and normal heart sounds.   Pulmonary/Chest: Effort normal. She has wheezes.  Musculoskeletal: Normal range of motion.  Lymphadenopathy:    She has cervical adenopathy.  Neurological: She is alert and oriented to person, place, and time.  Skin: Skin is warm and dry.  Psychiatric: She has a normal mood and affect. Her behavior is normal.  Nursing note and vitals reviewed.       Assessment & Plan:   Problem List Items Addressed This Visit    None    Visit Diagnoses    URI (upper respiratory infection)    -  Primary   Relevant Orders   Rapid strep screen (not at [facility])    Discussed that her symptoms could be allergy related since they have a pattern quality to them annually - recommended zyrtec and flonase. Sent viscous lidocaine and tessalon for symptomatic relief. Can continue mucinex.  Also discussed smoking cessation to help with her breathing - she is not ready to quit at this time.    Follow up plan: Return if symptoms worsen or fail to improve.

## 2016-05-26 ENCOUNTER — Telehealth: Payer: Self-pay | Admitting: Unknown Physician Specialty

## 2016-05-26 ENCOUNTER — Other Ambulatory Visit: Payer: Self-pay | Admitting: Family Medicine

## 2016-05-26 MED ORDER — AZITHROMYCIN 250 MG PO TABS: ORAL_TABLET | ORAL | 0 refills | Status: DC

## 2016-05-26 MED ORDER — HYDROCOD POLST-CPM POLST ER 10-8 MG/5ML PO SUER: 5.0000 mL | Freq: Two times a day (BID) | ORAL | 0 refills | Status: DC | PRN

## 2016-05-26 NOTE — Telephone Encounter (Signed)
Patient notified

## 2016-05-26 NOTE — Telephone Encounter (Signed)
Pt called stated she was seen yesterday and was told she has allergies. Pt stated her cough is much worse and deeper now. Pt would like advice on how to proceed. Thanks.

## 2016-05-26 NOTE — Telephone Encounter (Signed)
Tussionex printed and ready to be sent. Please let her know that it can make her very drowsy so best taken at night when she won't be driving. Thanks

## 2016-05-26 NOTE — Telephone Encounter (Signed)
Patient notified. She also wants to know if you can call her in some cough syrup. She states that's the thing that's bothering her the most.

## 2016-05-26 NOTE — Telephone Encounter (Signed)
Please call patient and let her know that I have sent in antibiotics for her, but she should only go get them if she is still not feeling better after giving it 3 or 4 more days. Continue supportive medications as discussed yesterday. Can use humidifier and vicks vapor rub to help her breathing. Try to avoid smoking, at least while trying to get over this illness.

## 2016-05-26 NOTE — Telephone Encounter (Signed)
Routing to provider who saw patient yesterday °

## 2016-05-30 LAB — CULTURE, GROUP A STREP: Strep A Culture: NEGATIVE

## 2016-05-30 LAB — RAPID STREP SCREEN (MED CTR MEBANE ONLY): STREP GP A AG, IA W/REFLEX: NEGATIVE

## 2016-06-02 ENCOUNTER — Telehealth: Payer: Self-pay | Admitting: Family Medicine

## 2016-06-02 NOTE — Telephone Encounter (Signed)
Routing to provider  

## 2016-06-02 NOTE — Telephone Encounter (Signed)
Pt would like to have something called in to [facility] for a yeast infection after taking antibiotics.

## 2016-06-03 MED ORDER — FLUCONAZOLE 150 MG PO TABS: 150.0000 mg | ORAL_TABLET | Freq: Once | ORAL | 0 refills | Status: AC

## 2016-06-03 NOTE — Telephone Encounter (Signed)
Diflucan sent to her pharmacy

## 2016-06-12 ENCOUNTER — Other Ambulatory Visit: Payer: Self-pay | Admitting: Family Medicine

## 2016-08-30 ENCOUNTER — Other Ambulatory Visit: Payer: Self-pay | Admitting: Cardiovascular Disease

## 2016-08-30 ENCOUNTER — Emergency Department: Payer: BC Managed Care – PPO

## 2016-08-30 ENCOUNTER — Inpatient Hospital Stay
Admission: EM | Admit: 2016-08-30 | Discharge: 2016-08-31 | DRG: 392 | Disposition: A | Discharge status: Home / Self Care | Payer: BC Managed Care – PPO | Attending: Internal Medicine | Admitting: Internal Medicine

## 2016-08-30 ENCOUNTER — Encounter
Admission: EM | Disposition: A | Discharge status: Home / Self Care | Payer: Self-pay | Source: Home / Self Care | Attending: Internal Medicine

## 2016-08-30 DIAGNOSIS — E119 Type 2 diabetes mellitus without complications: Secondary | ICD-10-CM | POA: Diagnosis present

## 2016-08-30 DIAGNOSIS — Z801 Family history of malignant neoplasm of trachea, bronchus and lung: Secondary | ICD-10-CM

## 2016-08-30 DIAGNOSIS — F17210 Nicotine dependence, cigarettes, uncomplicated: Secondary | ICD-10-CM | POA: Diagnosis present

## 2016-08-30 DIAGNOSIS — F319 Bipolar disorder, unspecified: Secondary | ICD-10-CM | POA: Diagnosis present

## 2016-08-30 DIAGNOSIS — Z808 Family history of malignant neoplasm of other organs or systems: Secondary | ICD-10-CM | POA: Diagnosis not present

## 2016-08-30 DIAGNOSIS — Z833 Family history of diabetes mellitus: Secondary | ICD-10-CM

## 2016-08-30 DIAGNOSIS — Z8249 Family history of ischemic heart disease and other diseases of the circulatory system: Secondary | ICD-10-CM | POA: Diagnosis not present

## 2016-08-30 DIAGNOSIS — Z8261 Family history of arthritis: Secondary | ICD-10-CM

## 2016-08-30 DIAGNOSIS — I252 Old myocardial infarction: Secondary | ICD-10-CM | POA: Diagnosis not present

## 2016-08-30 DIAGNOSIS — I214 Non-ST elevation (NSTEMI) myocardial infarction: Secondary | ICD-10-CM | POA: Diagnosis present

## 2016-08-30 DIAGNOSIS — I208 Other forms of angina pectoris: Secondary | ICD-10-CM

## 2016-08-30 DIAGNOSIS — R778 Other specified abnormalities of plasma proteins: Secondary | ICD-10-CM

## 2016-08-30 DIAGNOSIS — I10 Essential (primary) hypertension: Secondary | ICD-10-CM | POA: Diagnosis present

## 2016-08-30 DIAGNOSIS — E118 Type 2 diabetes mellitus with unspecified complications: Secondary | ICD-10-CM

## 2016-08-30 DIAGNOSIS — K219 Gastro-esophageal reflux disease without esophagitis: Secondary | ICD-10-CM | POA: Diagnosis present

## 2016-08-30 DIAGNOSIS — Z881 Allergy status to other antibiotic agents status: Secondary | ICD-10-CM

## 2016-08-30 DIAGNOSIS — R079 Chest pain, unspecified: Secondary | ICD-10-CM | POA: Diagnosis present

## 2016-08-30 DIAGNOSIS — Z7982 Long term (current) use of aspirin: Secondary | ICD-10-CM | POA: Diagnosis not present

## 2016-08-30 DIAGNOSIS — R7989 Other specified abnormal findings of blood chemistry: Secondary | ICD-10-CM

## 2016-08-30 HISTORY — PX: CARDIAC CATHETERIZATION: SHX172

## 2016-08-30 LAB — TROPONIN I
TROPONIN I: 0.52 ng/mL — AB (ref <0.03)
Troponin I: 0.55 ng/mL (ref <0.03)
Troponin I: 0.49 ng/mL (ref <0.03)

## 2016-08-30 LAB — BASIC METABOLIC PANEL
Anion gap: 8 (ref 5–15)
BUN: 12 mg/dL (ref 6–20)
CHLORIDE: 102 mmol/L (ref 101–111)
CO2: 23 mmol/L (ref 22–32)
CREATININE: 0.76 mg/dL (ref 0.44–1.00)
Calcium: 9.3 mg/dL (ref 8.9–10.3)
GFR calc Af Amer: >60 mL/min (ref >60)
GFR calc non Af Amer: >60 mL/min (ref >60)
GLUCOSE: 278 mg/dL — AB (ref 65–99)
Potassium: 4 mmol/L (ref 3.5–5.1)
Sodium: 133 mmol/L — ABNORMAL LOW (ref 135–145)

## 2016-08-30 LAB — POCT PREGNANCY, URINE: PREG TEST UR: NEGATIVE

## 2016-08-30 LAB — TSH: TSH: 1.313 u[IU]/mL (ref 0.350–4.500)

## 2016-08-30 LAB — CBC
HCT: 35 % (ref 35.0–47.0)
Hemoglobin: 11.1 g/dL — ABNORMAL LOW (ref 12.0–16.0)
MCH: 19.2 pg — AB (ref 26.0–34.0)
MCHC: 31.8 g/dL — AB (ref 32.0–36.0)
MCV: 60.5 fL — AB (ref 80.0–100.0)
PLATELETS: 209 10*3/uL (ref 150–440)
RBC: 5.78 MIL/uL — AB (ref 3.80–5.20)
RDW: 18.4 % — AB (ref 11.5–14.5)
WBC: 8.2 10*3/uL (ref 3.6–11.0)

## 2016-08-30 LAB — APTT: APTT: 28 s (ref 24–36)

## 2016-08-30 LAB — PROTIME-INR
INR: 0.95
PROTHROMBIN TIME: 12.7 s (ref 11.4–15.2)

## 2016-08-30 LAB — MRSA PCR SCREENING: MRSA by PCR: NEGATIVE

## 2016-08-30 SURGERY — LEFT HEART CATH AND CORONARY ANGIOGRAPHY
Anesthesia: Moderate Sedation | Laterality: Right

## 2016-08-30 SURGERY — LEFT HEART CATH
Anesthesia: Moderate Sedation | Laterality: Left

## 2016-08-30 MED ORDER — SODIUM CHLORIDE 0.9 % WEIGHT BASED INFUSION: 1.0000 mL/kg/h | INTRAVENOUS | Status: DC

## 2016-08-30 MED ORDER — NITROGLYCERIN 0.4 MG SL SUBL: 0.4000 mg | SUBLINGUAL_TABLET | SUBLINGUAL | Status: DC | PRN

## 2016-08-30 MED ORDER — SODIUM CHLORIDE 0.9% FLUSH: 3.0000 mL | INTRAVENOUS | Status: DC | PRN

## 2016-08-30 MED ORDER — SODIUM CHLORIDE 0.9 % IV SOLN: 250.0000 mL | INTRAVENOUS | Status: DC | PRN

## 2016-08-30 MED ORDER — ONDANSETRON HCL 4 MG/2ML IJ SOLN: 4.0000 mg | Freq: Four times a day (QID) | INTRAMUSCULAR | Status: DC | PRN

## 2016-08-30 MED ORDER — ACETAMINOPHEN 325 MG PO TABS
650.0000 mg | ORAL_TABLET | Freq: Four times a day (QID) | ORAL | Status: DC | PRN
  Administered 2016-08-30: 650 mg via ORAL

## 2016-08-30 MED ORDER — FENTANYL CITRATE (PF) 100 MCG/2ML IJ SOLN
INTRAMUSCULAR | Status: DC | PRN
  Administered 2016-08-30 (×2): 50 ug via INTRAVENOUS

## 2016-08-30 MED ORDER — MIDAZOLAM HCL 2 MG/2ML IJ SOLN
INTRAMUSCULAR | Status: DC | PRN
  Administered 2016-08-30: 2 mg via INTRAVENOUS

## 2016-08-30 MED ORDER — ASPIRIN 81 MG PO CHEW
CHEWABLE_TABLET | ORAL | Status: AC
  Filled 2016-08-30: qty 4

## 2016-08-30 MED ORDER — HEPARIN BOLUS VIA INFUSION
4000.0000 [IU] | Freq: Once | INTRAVENOUS | Status: AC
  Administered 2016-08-30: 4000 [IU] via INTRAVENOUS
  Filled 2016-08-30: qty 4000

## 2016-08-30 MED ORDER — ACETAMINOPHEN 650 MG RE SUPP: 650.0000 mg | Freq: Four times a day (QID) | RECTAL | Status: DC | PRN

## 2016-08-30 MED ORDER — HYDROCODONE-ACETAMINOPHEN 5-325 MG PO TABS
1.0000 | ORAL_TABLET | ORAL | Status: DC | PRN
  Administered 2016-08-31: 1 via ORAL
  Filled 2016-08-30: qty 1

## 2016-08-30 MED ORDER — LAMOTRIGINE 100 MG PO TABS
400.0000 mg | ORAL_TABLET | Freq: Every day | ORAL | Status: DC
  Administered 2016-08-30: 400 mg via ORAL
  Filled 2016-08-30: qty 2

## 2016-08-30 MED ORDER — ALBUTEROL SULFATE (2.5 MG/3ML) 0.083% IN NEBU: 2.5000 mg | INHALATION_SOLUTION | Freq: Four times a day (QID) | RESPIRATORY_TRACT | Status: DC | PRN

## 2016-08-30 MED ORDER — SODIUM CHLORIDE 0.9 % WEIGHT BASED INFUSION
1.0000 mL/kg/h | INTRAVENOUS | Status: AC
  Administered 2016-08-30: 1 mL/kg/h via INTRAVENOUS

## 2016-08-30 MED ORDER — NITROGLYCERIN 0.4 MG SL SUBL
0.4000 mg | SUBLINGUAL_TABLET | SUBLINGUAL | Status: DC | PRN
Start: 2016-08-30 — End: 2016-08-31

## 2016-08-30 MED ORDER — ACETAMINOPHEN 325 MG PO TABS: 650.0000 mg | ORAL_TABLET | ORAL | Status: DC | PRN

## 2016-08-30 MED ORDER — SODIUM CHLORIDE 0.9 % WEIGHT BASED INFUSION: 3.0000 mL/kg/h | INTRAVENOUS | Status: DC

## 2016-08-30 MED ORDER — QUETIAPINE FUMARATE ER 50 MG PO TB24
150.0000 mg | ORAL_TABLET | Freq: Every day | ORAL | Status: DC
  Administered 2016-08-30: 150 mg via ORAL
  Filled 2016-08-30 (×2): qty 3

## 2016-08-30 MED ORDER — SODIUM CHLORIDE 0.9% FLUSH: 3.0000 mL | Freq: Two times a day (BID) | INTRAVENOUS | Status: DC

## 2016-08-30 MED ORDER — IOPAMIDOL (ISOVUE-300) INJECTION 61%
INTRAVENOUS | Status: DC | PRN
  Administered 2016-08-30: 110 mL via INTRA_ARTERIAL

## 2016-08-30 MED ORDER — MIDAZOLAM HCL 2 MG/2ML IJ SOLN
INTRAMUSCULAR | Status: AC
  Filled 2016-08-30: qty 2

## 2016-08-30 MED ORDER — SODIUM CHLORIDE 0.9% FLUSH
3.0000 mL | Freq: Two times a day (BID) | INTRAVENOUS | Status: DC
Start: 2016-08-30 — End: 2016-08-30

## 2016-08-30 MED ORDER — SODIUM CHLORIDE 0.9 % IV SOLN
INTRAVENOUS | Status: DC
  Administered 2016-08-30: 23:00:00 via INTRAVENOUS

## 2016-08-30 MED ORDER — PANTOPRAZOLE SODIUM 40 MG PO TBEC
40.0000 mg | DELAYED_RELEASE_TABLET | Freq: Two times a day (BID) | ORAL | Status: DC
  Administered 2016-08-30 – 2016-08-31 (×3): 40 mg via ORAL
  Filled 2016-08-30 (×3): qty 1

## 2016-08-30 MED ORDER — NITROGLYCERIN 2 % TD OINT
1.0000 [in_us] | TOPICAL_OINTMENT | Freq: Once | TRANSDERMAL | Status: AC
  Administered 2016-08-30: 1 [in_us] via TOPICAL

## 2016-08-30 MED ORDER — HEPARIN (PORCINE) IN NACL 2-0.9 UNIT/ML-% IJ SOLN
INTRAMUSCULAR | Status: AC
Start: 2016-08-30 — End: 2016-08-30
  Filled 2016-08-30: qty 500

## 2016-08-30 MED ORDER — FENTANYL CITRATE (PF) 100 MCG/2ML IJ SOLN
INTRAMUSCULAR | Status: AC
  Filled 2016-08-30: qty 2

## 2016-08-30 MED ORDER — ATORVASTATIN CALCIUM 20 MG PO TABS: 80.0000 mg | ORAL_TABLET | Freq: Every day | ORAL | Status: DC

## 2016-08-30 MED ORDER — INFLUENZA VAC SPLIT QUAD 0.5 ML IM SUSY: 0.5000 mL | PREFILLED_SYRINGE | INTRAMUSCULAR | Status: DC

## 2016-08-30 MED ORDER — HEPARIN (PORCINE) IN NACL 100-0.45 UNIT/ML-% IJ SOLN
1300.0000 [IU]/h | INTRAMUSCULAR | Status: DC
  Administered 2016-08-30: 1300 [IU]/h via INTRAVENOUS
  Filled 2016-08-30: qty 250

## 2016-08-30 MED ORDER — ASPIRIN EC 325 MG PO TBEC: 325.0000 mg | DELAYED_RELEASE_TABLET | Freq: Every day | ORAL | Status: DC

## 2016-08-30 MED ORDER — ASPIRIN 81 MG PO CHEW: 81.0000 mg | CHEWABLE_TABLET | ORAL | Status: DC

## 2016-08-30 MED ORDER — ASPIRIN 81 MG PO CHEW
324.0000 mg | CHEWABLE_TABLET | Freq: Once | ORAL | Status: AC
  Administered 2016-08-30: 324 mg via ORAL

## 2016-08-30 MED ORDER — ONDANSETRON HCL 4 MG PO TABS: 4.0000 mg | ORAL_TABLET | Freq: Four times a day (QID) | ORAL | Status: DC | PRN

## 2016-08-30 MED ORDER — NITROGLYCERIN 2 % TD OINT
TOPICAL_OINTMENT | TRANSDERMAL | Status: AC
  Filled 2016-08-30: qty 1

## 2016-08-30 MED ORDER — CLONAZEPAM 1 MG PO TABS
3.0000 mg | ORAL_TABLET | Freq: Every day | ORAL | Status: DC
  Administered 2016-08-30 – 2016-08-31 (×2): 3 mg via ORAL
  Filled 2016-08-30 (×2): qty 3

## 2016-08-30 MED ORDER — METOPROLOL TARTRATE 25 MG PO TABS
25.0000 mg | ORAL_TABLET | Freq: Two times a day (BID) | ORAL | Status: DC
  Administered 2016-08-30 – 2016-08-31 (×3): 25 mg via ORAL
  Filled 2016-08-30 (×3): qty 1

## 2016-08-30 SURGICAL SUPPLY — 8 items
CATH 5FR JL4 DIAGNOSTIC (CATHETERS) ×2 IMPLANT
CATH 5FR JR4 DIAGNOSTIC (CATHETERS) ×3 IMPLANT
CATH INFINITI 5FR ANG PIGTAIL (CATHETERS) ×3 IMPLANT
GUIDEWIRE 3MM J TIP .035 145 (WIRE) ×3 IMPLANT
KIT MANI 3VAL PERCEP (MISCELLANEOUS) ×3 IMPLANT
NEEDLE PERC 18GX7CM (NEEDLE) ×3 IMPLANT
PACK CARDIAC CATH (CUSTOM PROCEDURE TRAY) ×3 IMPLANT
SHEATH PINNACLE 5F 10CM (SHEATH) ×3 IMPLANT

## 2016-08-30 NOTE — Progress Notes (Signed)
Report to [facility] telemetry.  Check right groin for bleeding or hematoma.  Patient will be on bedrest for 2 hours post sheath pull---out of bed at 17:35.  Bilateral pulses are 2's DP's.Marland Kitchen

## 2016-08-30 NOTE — ED Notes (Signed)
Attempted IV in right AC without success

## 2016-08-30 NOTE — ED Notes (Signed)
Pt reports that she has shortness of breath and chest pain for the last 3 days - pt had MI January 12th, 2017 - pt states the pain feels the same as with her prior MI - pain is central chest radiating to the left and into back - at this time respirations are even and unlabored and pt is able to speak in complete sentences without difficulty

## 2016-08-30 NOTE — ED Notes (Signed)
Dr Humphrey Rolls requested poc urine preg and then to have pt brought to the cath lab - Tammy RN notified

## 2016-08-30 NOTE — ED Notes (Signed)
Elevated troponin reported to Dr Reita Cliche
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
## 2016-08-30 NOTE — Progress Notes (Signed)
Normal coronaries with normal LVEF. She had calcium score 0, normal coronaries on CTA coronaries and was also was done for troponin 0.4. She has GERD, advise protonix 40 po BID.

## 2016-08-30 NOTE — ED Notes (Signed)
Hooked pt up to monitor.
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
## 2016-08-30 NOTE — ED Notes (Signed)
Tech talked with pt a little while to try and calm pt who is upset due to test results. Pt feeling somewhat better when tech left room.

## 2016-08-30 NOTE — ED Notes (Signed)
Called neg pregnancy test to cath lab - they stated that a specials nurse is in route to get pt
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
## 2016-08-30 NOTE — ED Triage Notes (Signed)
Pt c/o chest pain with SOB for the past 3 days.. States she had MI in jan 2017 and this feels the same.Marland Kitchen

## 2016-08-30 NOTE — ED Notes (Signed)
Report given to specials nurse and pt transported to cath lab at this time - consent signed for procedure

## 2016-08-31 ENCOUNTER — Encounter: Payer: Self-pay | Admitting: Cardiovascular Disease

## 2016-08-31 LAB — BASIC METABOLIC PANEL
Anion gap: 6 (ref 5–15)
BUN: 10 mg/dL (ref 6–20)
CHLORIDE: 106 mmol/L (ref 101–111)
CO2: 22 mmol/L (ref 22–32)
CREATININE: 0.8 mg/dL (ref 0.44–1.00)
Calcium: 8.4 mg/dL — ABNORMAL LOW (ref 8.9–10.3)
GFR calc Af Amer: >60 mL/min (ref >60)
GFR calc non Af Amer: >60 mL/min (ref >60)
GLUCOSE: 222 mg/dL — AB (ref 65–99)
Potassium: 3.9 mmol/L (ref 3.5–5.1)
SODIUM: 134 mmol/L — AB (ref 135–145)

## 2016-08-31 LAB — CBC
HEMATOCRIT: 32.1 % — AB (ref 35.0–47.0)
HEMOGLOBIN: 10.1 g/dL — AB (ref 12.0–16.0)
MCH: 19.2 pg — AB (ref 26.0–34.0)
MCHC: 31.4 g/dL — AB (ref 32.0–36.0)
MCV: 61.1 fL — AB (ref 80.0–100.0)
Platelets: 179 10*3/uL (ref 150–440)
RBC: 5.26 MIL/uL — AB (ref 3.80–5.20)
RDW: 18.1 % — ABNORMAL HIGH (ref 11.5–14.5)
WBC: 7.9 10*3/uL (ref 3.6–11.0)

## 2016-08-31 LAB — GLUCOSE, CAPILLARY
GLUCOSE-CAPILLARY: 177 mg/dL — AB (ref 65–99)
Glucose-Capillary: 202 mg/dL — ABNORMAL HIGH (ref 65–99)

## 2016-08-31 LAB — TROPONIN I: Troponin I: 0.53 ng/mL (ref <0.03)

## 2016-08-31 LAB — HEMOGLOBIN A1C
Hgb A1c MFr Bld: 9.8 % — ABNORMAL HIGH (ref 4.8–5.6)
Mean Plasma Glucose: 235 mg/dL

## 2016-08-31 MED ORDER — INSULIN ASPART 100 UNIT/ML ~~LOC~~ SOLN
0.0000 [IU] | Freq: Three times a day (TID) | SUBCUTANEOUS | Status: DC
  Administered 2016-08-31: 3 [IU] via SUBCUTANEOUS
  Filled 2016-08-31: qty 3

## 2016-08-31 MED ORDER — METFORMIN HCL 500 MG PO TABS: 500.0000 mg | ORAL_TABLET | Freq: Two times a day (BID) | ORAL | 0 refills | Status: DC

## 2016-08-31 MED ORDER — INSULIN ASPART 100 UNIT/ML ~~LOC~~ SOLN: 0.0000 [IU] | Freq: Every day | SUBCUTANEOUS | Status: DC

## 2016-08-31 MED ORDER — METFORMIN HCL 500 MG PO TABS
500.0000 mg | ORAL_TABLET | Freq: Two times a day (BID) | ORAL | Status: DC
  Administered 2016-08-31: 500 mg via ORAL
  Filled 2016-08-31: qty 1

## 2016-08-31 MED ORDER — PANTOPRAZOLE SODIUM 40 MG PO TBEC: 40.0000 mg | DELAYED_RELEASE_TABLET | Freq: Every day | ORAL | 0 refills | Status: DC

## 2016-08-31 MED ORDER — LIVING WELL WITH DIABETES BOOK
Freq: Once | Status: AC
  Administered 2016-08-31: 12:00:00
  Filled 2016-08-31: qty 1

## 2016-08-31 MED ORDER — BLOOD GLUCOSE MONITOR KIT: PACK | 0 refills | Status: AC

## 2016-08-31 NOTE — Progress Notes (Signed)
  RD consulted for nutrition education regarding diabetes. Pt newly dx DM  Lab Results  Component Value Date   HGBA1C 9.8 (H) 08/30/2016    RD provided "Carbohydrate Counting for People with Diabetes" handout from the Academy of Nutrition and Dietetics. Discussed different food groups and their effects on blood sugar, emphasizing carbohydrate-containing foods. Provided list of carbohydrates and recommended serving sizes of common foods. 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 Provided examples of ways to balance meals/snacks and encouraged intake of high-fiber, whole grain complex carbohydrates. Teach back method used.  Expect good compliance.  Pt being discharged ot home today  Kerman Passey [place], [place], LDN [phone] Pager  [phone] Weekend/On-Call Pager
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
## 2016-08-31 NOTE — Progress Notes (Addendum)
  SUBJECTIVE: [name] is feeling well this morning. She did have some mild chest discomfort last evening but not as severe as what brought her in.    Vitals:   08/30/16 1929 08/30/16 2235 08/31/16 0356 08/31/16 0838  BP: 140/78 (!) 136/59 140/64 131/61  Pulse: 81 72 88 81  Resp: 14  14 16   Temp: 98.3 F (36.8 C)  98.8 F (37.1 C) 98.3 F (36.8 C)  TempSrc: Oral  Oral Oral  SpO2: 97%  95% 95%  Weight:      Height:        Intake/Output Summary (Last 24 hours) at 08/31/16 0912 Last data filed at 08/31/16 0200  Gross per 24 hour  Intake           382.81 ml  Output              800 ml  Net          -417.19 ml    LABS: Basic Metabolic Panel:  Recent Labs  08/30/16 1011 08/31/16 0508  NA 133* 134*  K 4.0 3.9  CL 102 106  CO2 23 22  GLUCOSE 278* 222*  BUN 12 10  CREATININE 0.76 0.80  CALCIUM 9.3 8.4*   Liver Function Tests: No results for input(s): AST, ALT, ALKPHOS, BILITOT, PROT, ALBUMIN in the last 72 hours. No results for input(s): LIPASE, AMYLASE in the last 72 hours. CBC:  Recent Labs  08/30/16 1011 08/31/16 0508  WBC 8.2 7.9  HGB 11.1* 10.1*  HCT 35.0 32.1*  MCV 60.5* 61.1*  PLT 209 179   Cardiac Enzymes:  Recent Labs  08/30/16 1716 08/30/16 2312 08/31/16 0508  TROPONINI 0.55* 0.49* 0.53*   BNP: Invalid input(s): POCBNP D-Dimer: No results for input(s): DDIMER in the last 72 hours. Hemoglobin A1C:  Recent Labs  08/30/16 1716  HGBA1C 9.8*   Fasting Lipid Panel: No results for input(s): CHOL, HDL, LDLCALC, TRIG, CHOLHDL, LDLDIRECT in the last 72 hours. Thyroid Function Tests:  Recent Labs  08/30/16 1716  TSH 1.313   Anemia Panel: No results for input(s): VITAMINB12, FOLATE, FERRITIN, TIBC, IRON, RETICCTPCT in the last 72 hours.   PHYSICAL EXAM General: Well developed, well nourished, in no acute distress HEENT:  Normocephalic and atramatic Neck:  No JVD.  Lungs: Clear bilaterally to auscultation and percussion. Heart: HRRR  . Normal S1 and S2 without gallops or murmurs.  Abdomen: Bowel sounds are positive, abdomen soft and non-tender  Msk:  Back normal, normal gait. Normal strength and tone for age. Extremities: No clubbing, cyanosis or edema.   Neuro: Alert and oriented X 3. Psych:  Good affect, responds appropriately  TELEMETRY: NSR 83 bpm  ASSESSMENT AND PLAN: Chest pain with elevated troponin, maximally to 0.55. Cardiac cath was done and found normal coronaries with normal EF which correlates to her CCTA of 10/2015 that showed a calcium score of 0 and normal coronary arteries.Right groin is soft, non-tender, without bleeding or bruit. Her discomfort is likely related to GERD and advise Protonix 40 mg po bid. Pt is also newly diagnosed diabetic. Briefly advised on heart healthy diet, high in fiber and low in concentrated sweets and processed foods.  Patient is stable for discharge. Hospital follow up for groin check given for 09/05/2016 at 10:00.   Active Problems:   NSTEMI (non-ST elevated myocardial infarction) ([place])    [name] Perch, NP 08/31/2016 9:12 AM

## 2016-08-31 NOTE — Progress Notes (Signed)
[name] at [facility] was admitted to the [name] on 08/30/2016 and Discharged  08/31/2016 and should be excused from work/school   for 2 days starting 08/30/2016 , may return to work/school without any restrictions.  Call Bettey Costa MD with questions.  [name] M.D on 08/31/2016,at 4:55 PM  [name] at [name]  [phone]

## 2016-08-31 NOTE — Progress Notes (Signed)
Inpatient Diabetes Program Recommendations  AACE/ADA: New Consensus Statement on Inpatient Glycemic Control (2015)  Target Ranges:  Prepandial:   less than 140 mg/dL      Peak postprandial:   less than 180 mg/dL (1-2 hours)      Critically ill patients:  140 - 180 mg/dL   Results for LEXY, DANNELLY (MRN [id number]) as of 08/31/2016 11:35  Ref. Range 08/30/2016 17:16  Hemoglobin A1C Latest Ref Range: 4.8 - 5.6 % 9.8 (H)   Results for JESSELIN, GROSJEAN (MRN [id number]) as of 08/31/2016 11:35  Ref. Range 08/31/2016 07:53  Glucose-Capillary Latest Ref Range: 65 - 99 mg/dL 177 (H)    Admit with: CP  Current Insulin Orders: Novolog Sensitive Correction Scale/ SSI (0-9 units) TID AC + HS      Metformin 500 mg BID    Spoke with pt about new diagnosis.  Discussed A1C results with her and explained what an A1C is, basic pathophysiology of DM Type 2, basic home care, basic diabetes diet nutrition principles, importance of checking CBGs and maintaining good CBG control to prevent long-term and short-term complications.  Also reviewed blood sugar goals and A1c goals for home.  Encouraged pt to check her CBGs at least 1-2 times per day over the next few months to get baseline CBG data for her PCP.  Patient agreeable.  RNs to provide ongoing basic DM education at bedside with this patient.  Have ordered educational booklet for pt to take home with her.  RD consult also placed this AM for DM diet education for this patient.  Will place order for Outpatient DM Education to the Lifestyle center for further DM education as an outpatient.  Patient concerned about the cost of CBG meter and supplies.  Encouraged patient to check her pharmacy to see what the price will be.  If this price is too much, gave pt pamphlet on [employer] CBG meter and supplies that she can buy OTC without Rx.  Meter is $9 and a box of 50 strips is $9.  Also told pt about [facility]'s prices on Metformin ($4) if [facility] is too  expensive.  She can have her Rx transferred to [facility] if needed.  Discussed Metformin and how to take, side effects, when to take, etc.  Also reviewed Sick day guidelines with pt as well.  Have asked RN/NT caring for pt today to please instruct pt and allow her to check her own CBG before d/c today.      --Will follow patient during hospitalization--  Wyn Quaker RN, MSN, CDE Diabetes Coordinator Inpatient Glycemic Control Team Team Pager: [phone] (8a-5p)

## 2016-08-31 NOTE — Progress Notes (Signed)
Discharge Instructions reviewed with patient. Patient verbalized understanding. Patient declined wheelchair. Patient tearful when exiting regarding new diagnosis, emotional support provided and questions answered. Work Note given to patient as well as Rx for blood glucose meter and Kit. Patient left the unit ambulatory escorted by nurse tech; belongings in hand.

## 2016-08-31 NOTE — Plan of Care (Signed)
Problem: Metabolic: Goal: Ability to maintain appropriate glucose levels will improve Outcome: Progressing Patient was able to check own blood sugar while in the hospital  Problem: Nutritional: Goal: Maintenance of adequate nutrition will improve Outcome: Progressing Patient was seen by Dietitcian and Diabetes Coordinator Goal: Progress toward achieving an optimal weight will improve Outcome: Progressing Patient educated on healthy eating provided, living well booklet  Problem: Skin Integrity: Goal: Risk for impaired skin integrity will decrease Outcome: Adequate for Discharge No skin breakdown noted upon discharge  Comments: Patient diagnosed with New Onset Diabetes; education provided by this nurse in addition to emotional support and encouragement. Patient also seen by Care Management and instructed on how to obtain prescriptions post-hospitalization. Patient verbalized understanding.

## 2016-09-02 ENCOUNTER — Encounter: Payer: Self-pay | Admitting: Unknown Physician Specialty

## 2016-09-02 ENCOUNTER — Ambulatory Visit (INDEPENDENT_AMBULATORY_CARE_PROVIDER_SITE_OTHER): Payer: BC Managed Care – PPO | Admitting: Unknown Physician Specialty

## 2016-09-02 VITALS — BP 140/78 | HR 112 | Temp 97.9°F | Ht 70.1 in | Wt 272.2 lb

## 2016-09-02 DIAGNOSIS — E1165 Type 2 diabetes mellitus with hyperglycemia: Secondary | ICD-10-CM | POA: Diagnosis not present

## 2016-09-02 DIAGNOSIS — N181 Chronic kidney disease, stage 1: Secondary | ICD-10-CM

## 2016-09-02 DIAGNOSIS — N189 Chronic kidney disease, unspecified: Secondary | ICD-10-CM | POA: Insufficient documentation

## 2016-09-02 DIAGNOSIS — Z23 Encounter for immunization: Secondary | ICD-10-CM | POA: Diagnosis not present

## 2016-09-02 DIAGNOSIS — I129 Hypertensive chronic kidney disease with stage 1 through stage 4 chronic kidney disease, or unspecified chronic kidney disease: Secondary | ICD-10-CM | POA: Insufficient documentation

## 2016-09-02 DIAGNOSIS — E1129 Type 2 diabetes mellitus with other diabetic kidney complication: Secondary | ICD-10-CM | POA: Insufficient documentation

## 2016-09-02 DIAGNOSIS — I10 Essential (primary) hypertension: Secondary | ICD-10-CM

## 2016-09-02 LAB — UA/M W/RFLX CULTURE, ROUTINE
BILIRUBIN UA: NEGATIVE
GLUCOSE, UA: NEGATIVE
KETONES UA: NEGATIVE
Leukocytes, UA: NEGATIVE
Nitrite, UA: NEGATIVE
PROTEIN UA: NEGATIVE
RBC, UA: NEGATIVE
Specific Gravity, UA: 1.01 (ref 1.005–1.030)
UUROB: 0.2 mg/dL (ref 0.2–1.0)
pH, UA: 6 (ref 5.0–7.5)

## 2016-09-02 LAB — LIPID PANEL PICCOLO, WAIVED
CHOLESTEROL PICCOLO, WAIVED: 109 mg/dL (ref <200)
Chol/HDL Ratio Piccolo,Waive: 4 mg/dL
HDL Chol Piccolo, Waived: 28 mg/dL — ABNORMAL LOW (ref >59)
LDL CHOL CALC PICCOLO WAIVED: 27 mg/dL (ref <100)
Triglycerides Piccolo,Waived: 272 mg/dL — ABNORMAL HIGH (ref <150)
VLDL Chol Calc Piccolo,Waive: 54 mg/dL — ABNORMAL HIGH (ref <30)

## 2016-09-02 LAB — MICROALBUMIN, URINE WAIVED
CREATININE, URINE WAIVED: 100 mg/dL (ref 10–300)
MICROALB, UR WAIVED: 80 mg/L — AB (ref 0–19)

## 2016-09-02 MED ORDER — EMPAGLIFLOZIN 10 MG PO TABS: 10.0000 mg | ORAL_TABLET | Freq: Every day | ORAL | 2 refills | Status: DC

## 2016-09-02 MED ORDER — PANTOPRAZOLE SODIUM 40 MG PO TBEC: 40.0000 mg | DELAYED_RELEASE_TABLET | Freq: Two times a day (BID) | ORAL | 3 refills | Status: DC

## 2016-09-02 MED ORDER — LISINOPRIL 5 MG PO TABS: 5.0000 mg | ORAL_TABLET | Freq: Every day | ORAL | 2 refills | Status: DC

## 2016-09-02 NOTE — Patient Instructions (Addendum)

## 2016-09-02 NOTE — Assessment & Plan Note (Signed)
Pt will get more education through her workplace.  Continue Metformin.  Will start Invokanna

## 2016-09-02 NOTE — Progress Notes (Signed)
BP 140/78 (BP Location: Left Arm, Patient Position: Sitting, Cuff Size: Large)   Pulse (!) 112   Temp 97.9 F (36.6 C)   Ht 5' 10.1" (1.781 m)   Wt 272 lb 3.2 oz (123.5 kg)   LMP 08/17/2016 (Approximate)   SpO2 98%   BMI 38.95 kg/m    Subjective:    Patient ID: [name], female    DOB: 02-14-1976, 40 y.o.   MRN: [id number]  HPI: [name] is a 40 y.o. female  Chief Complaint  Patient presents with  . Hospitalization Follow-up   Pt was admitted to the hospital to r/o MI.  While there, she was diagnosed with diabetes with a Hgb A1C of 9.0.  She has not yet been able to call for her nutritional counseling yet.  She did receive some in the hospital.  Blood sugar this AM was 170.  She is not taking ACE/ARB and not on a statin.  She is taking an ASA/day.    Cardiac tests were normal. Chest pain thought related to GERD and takiing Protonix   Relevant past medical, surgical, family and social history reviewed and updated as indicated. Interim medical history since our last visit reviewed. Allergies and medications reviewed and updated.  Review of Systems  Per HPI unless specifically indicated above     Objective:    BP 140/78 (BP Location: Left Arm, Patient Position: Sitting, Cuff Size: Large)   Pulse (!) 112   Temp 97.9 F (36.6 C)   Ht 5' 10.1" (1.781 m)   Wt 272 lb 3.2 oz (123.5 kg)   LMP 08/17/2016 (Approximate)   SpO2 98%   BMI 38.95 kg/m   Wt Readings from Last 3 Encounters:  09/02/16 272 lb 3.2 oz (123.5 kg)  08/30/16 300 lb (136.1 kg)  05/25/16 282 lb (127.9 kg)    Physical Exam  Constitutional: She is oriented to person, place, and time. She appears well-developed and well-nourished. No distress.  HENT:  Head: Normocephalic and atraumatic.  Eyes: Conjunctivae and lids are normal. Right eye exhibits no discharge. Left eye exhibits no discharge. No scleral icterus.  Neck: Normal range of motion. Neck supple. No JVD present. Carotid bruit is  not present.  Cardiovascular: Normal rate, regular rhythm and normal heart sounds.   Pulmonary/Chest: Effort normal and breath sounds normal.  Abdominal: Normal appearance. There is no splenomegaly or hepatomegaly.  Musculoskeletal: Normal range of motion.  Neurological: She is alert and oriented to person, place, and time.  Skin: Skin is warm, dry and intact. No rash noted. No pallor.  Psychiatric: She has a normal mood and affect. Her behavior is normal. Judgment and thought content normal.   Microalbumin is 80 Lipid panel shows TC of 109    Assessment & Plan:   Problem List Items Addressed This Visit      Unprioritized   Chronic kidney disease    Start Lisinopril      Hypertension    Start Lisinopril      Relevant Medications   lisinopril (PRINIVIL,ZESTRIL) 5 MG tablet   Poorly controlled type 2 diabetes mellitus ([place])    Pt will get more education through her workplace.  Continue Metformin.  Will start Invokanna      Relevant Medications   empagliflozin (JARDIANCE) 10 MG TABS tablet   lisinopril (PRINIVIL,ZESTRIL) 5 MG tablet   Other Relevant Orders   Comprehensive metabolic panel   Lipid Panel Piccolo, Waived   Microalbumin, Urine Waived   UA/M  w/rflx Culture, Routine    Other Visit Diagnoses    Need for influenza vaccination    -  Primary   Relevant Orders   Flu Vaccine QUAD 36+ mos IM (Completed)       Follow up plan: Return in about 1 month (around 10/02/2016).

## 2016-09-02 NOTE — Assessment & Plan Note (Signed)
Start Lisinopril. 

## 2016-09-03 LAB — COMPREHENSIVE METABOLIC PANEL
A/G RATIO: 1.7 (ref 1.2–2.2)
ALT: 24 IU/L (ref 0–32)
AST: 29 IU/L (ref 0–40)
Albumin: 4.5 g/dL (ref 3.5–5.5)
Alkaline Phosphatase: 82 IU/L (ref 39–117)
BUN/Creatinine Ratio: 14 (ref 9–23)
BUN: 12 mg/dL (ref 6–20)
Bilirubin Total: 0.3 mg/dL (ref 0.0–1.2)
CALCIUM: 9.4 mg/dL (ref 8.7–10.2)
CO2: 18 mmol/L (ref 18–29)
CREATININE: 0.84 mg/dL (ref 0.57–1.00)
Chloride: 101 mmol/L (ref 96–106)
GFR calc Af Amer: 102 mL/min/{1.73_m2} (ref >59)
GFR, EST NON AFRICAN AMERICAN: 88 mL/min/{1.73_m2} (ref >59)
Globulin, Total: 2.7 g/dL (ref 1.5–4.5)
Glucose: 170 mg/dL — ABNORMAL HIGH (ref 65–99)
POTASSIUM: 4.1 mmol/L (ref 3.5–5.2)
Sodium: 138 mmol/L (ref 134–144)
Total Protein: 7.2 g/dL (ref 6.0–8.5)

## 2016-09-07 ENCOUNTER — Inpatient Hospital Stay: Payer: BC Managed Care – PPO | Admitting: Unknown Physician Specialty

## 2016-09-26 ENCOUNTER — Other Ambulatory Visit: Payer: Self-pay | Admitting: Unknown Physician Specialty

## 2016-09-26 MED ORDER — METFORMIN HCL 500 MG PO TABS: 500.0000 mg | ORAL_TABLET | Freq: Two times a day (BID) | ORAL | 0 refills | Status: DC

## 2016-09-26 NOTE — Telephone Encounter (Signed)
Pt came stated she needs a refill on Metformin called in to her pharmacy as she will run out before her appt on 10/03/16. Pharm [facility] in [place]. Thanks.

## 2016-09-26 NOTE — Telephone Encounter (Signed)
Routing to provider  

## 2016-10-03 ENCOUNTER — Encounter: Payer: Self-pay | Admitting: Unknown Physician Specialty

## 2016-10-03 ENCOUNTER — Ambulatory Visit (INDEPENDENT_AMBULATORY_CARE_PROVIDER_SITE_OTHER): Payer: BC Managed Care – PPO | Admitting: Unknown Physician Specialty

## 2016-10-03 VITALS — BP 100/69 | HR 73 | Wt 264.2 lb

## 2016-10-03 DIAGNOSIS — Z23 Encounter for immunization: Secondary | ICD-10-CM

## 2016-10-03 DIAGNOSIS — L309 Dermatitis, unspecified: Secondary | ICD-10-CM

## 2016-10-03 DIAGNOSIS — E1165 Type 2 diabetes mellitus with hyperglycemia: Secondary | ICD-10-CM | POA: Diagnosis not present

## 2016-10-03 DIAGNOSIS — I10 Essential (primary) hypertension: Secondary | ICD-10-CM | POA: Diagnosis not present

## 2016-10-03 MED ORDER — TRIAMCINOLONE ACETONIDE 0.1 % EX CREA: 1.0000 "application " | TOPICAL_CREAM | Freq: Two times a day (BID) | CUTANEOUS | 0 refills | Status: DC

## 2016-10-03 NOTE — Progress Notes (Signed)
BP 100/69 (BP Location: Right Arm, Patient Position: Sitting, Cuff Size: Large)   Pulse 73   Wt 264 lb 3.2 oz (119.8 kg)   LMP 10/01/2016 (Exact Date)   SpO2 97%   BMI 37.80 kg/m    Subjective:    Patient ID: [name], female    DOB: 05/03/1976, 40 y.o.   MRN: [id number]  HPI: [name] is a 40 y.o. female  Chief Complaint  Patient presents with  . Diabetes    pt states last eye was about 4 years ago   . Hypertension   Diabetes: This is a f/u of her blood sugar from her previous hospitalization in which her Hgb A1C was 9.8.  She is off Seroquel.   No hypoglycemic episodes No hyperglycemic episodes Feet problems:none Blood Sugars averaging: eye exam within last year Last Hgb A1C: 9.8  Hypertension  Using medications without difficulty Average home BPs   Using medication without problems or lightheadedness No chest pain with exertion or shortness of breath No Edema  Elevated Cholesterol Using medications without problems No Muscle aches  Diet: No concentrated sweets.  Off her sodas.  Having vegetables for snacks and with meals.  Lean meats for dinner Exercise: Walking at recess.    Dry skin Using [employer].  Still with cuts on hands and dry around nails.    Relevant past medical, surgical, family and social history reviewed and updated as indicated. Interim medical history since our last visit reviewed. Allergies and medications reviewed and updated.  Review of Systems  Per HPI unless specifically indicated above     Objective:    BP 100/69 (BP Location: Right Arm, Patient Position: Sitting, Cuff Size: Large)   Pulse 73   Wt 264 lb 3.2 oz (119.8 kg)   LMP 10/01/2016 (Exact Date)   SpO2 97%   BMI 37.80 kg/m   Wt Readings from Last 3 Encounters:  10/03/16 264 lb 3.2 oz (119.8 kg)  09/02/16 272 lb 3.2 oz (123.5 kg)  08/30/16 300 lb (136.1 kg)    Physical Exam  Constitutional: She is oriented to person, place, and time. She appears  well-developed and well-nourished. No distress.  HENT:  Head: Normocephalic and atraumatic.  Eyes: Conjunctivae and lids are normal. Right eye exhibits no discharge. Left eye exhibits no discharge. No scleral icterus.  Cardiovascular: Normal rate.   Pulmonary/Chest: Effort normal.  Abdominal: Normal appearance. There is no splenomegaly or hepatomegaly.  Musculoskeletal: Normal range of motion.  Neurological: She is alert and oriented to person, place, and time.  Skin: Skin is intact. No rash noted. No pallor.  Cracking fingertips  Psychiatric: She has a normal mood and affect. Her behavior is normal. Judgment and thought content normal.    Results for orders placed or performed in visit on 09/02/16  Comprehensive metabolic panel  Result Value Ref Range   Glucose 170 (H) 65 - 99 mg/dL   BUN 12 6 - 20 mg/dL   Creatinine, Ser 0.84 0.57 - 1.00 mg/dL   GFR calc non Af Amer 88 >59 mL/min/1.73   GFR calc Af Amer 102 >59 mL/min/1.73   BUN/Creatinine Ratio 14 9 - 23   Sodium 138 134 - 144 mmol/L   Potassium 4.1 3.5 - 5.2 mmol/L   Chloride 101 96 - 106 mmol/L   CO2 18 18 - 29 mmol/L   Calcium 9.4 8.7 - 10.2 mg/dL   Total Protein 7.2 6.0 - 8.5 g/dL   Albumin 4.5 3.5 - 5.5  g/dL   Globulin, Total 2.7 1.5 - 4.5 g/dL   Albumin/Globulin Ratio 1.7 1.2 - 2.2   Bilirubin Total 0.3 0.0 - 1.2 mg/dL   Alkaline Phosphatase 82 39 - 117 IU/L   AST 29 0 - 40 IU/L   ALT 24 0 - 32 IU/L  Lipid Panel Piccolo, Waived  Result Value Ref Range   Cholesterol Piccolo, Waived 109 <200 mg/dL   HDL Chol Piccolo, Waived 28 (L) >59 mg/dL   Triglycerides Piccolo,Waived 272 (H) <150 mg/dL   Chol/HDL Ratio Piccolo,Waive 4.0 mg/dL   LDL Chol Calc Piccolo Waived 27 <100 mg/dL   VLDL Chol Calc Piccolo,Waive 54 (H) <30 mg/dL  Microalbumin, Urine Waived  Result Value Ref Range   Microalb, Ur Waived 80 (H) 0 - 19 mg/L   Creatinine, Urine Waived 100 10 - 300 mg/dL   Microalb/Creat Ratio 30-300 (H) <30 mg/g  UA/M  w/rflx Culture, Routine  Result Value Ref Range   Specific Gravity, UA 1.010 1.005 - 1.030   pH, UA 6.0 5.0 - 7.5   Color, UA Yellow Yellow   Appearance Ur Clear Clear   Leukocytes, UA Negative Negative   Protein, UA Negative Negative/Trace   Glucose, UA Negative Negative   Ketones, UA Negative Negative   RBC, UA Negative Negative   Bilirubin, UA Negative Negative   Urobilinogen, Ur 0.2 0.2 - 1.0 mg/dL   Nitrite, UA Negative Negative      Assessment & Plan:   Problem List Items Addressed This Visit      Unprioritized   Eczema of both hands   Hypertension    Taking Lisinopril      Poorly controlled type 2 diabetes mellitus ([place])    Doing better.  Continue present       Other Visit Diagnoses    Need for pneumococcal vaccination    -  Primary   Relevant Orders   Pneumococcal polysaccharide vaccine 23-valent greater than or equal to 2yo subcutaneous/IM (Completed)       Follow up plan: Return in about 2 months (around 12/04/2016).

## 2016-10-03 NOTE — Assessment & Plan Note (Signed)
Doing better.  Continue present

## 2016-10-03 NOTE — Patient Instructions (Addendum)
Pneumococcal Polysaccharide Vaccine: What You Need to Know 1. Why get vaccinated? Vaccination can protect older adults (and some children and younger adults) from pneumococcal disease. Pneumococcal disease is caused by bacteria that can spread from person to person through close contact. It can cause ear infections, and it can also lead to more serious infections of the:  Lungs (pneumonia),  Blood (bacteremia), and  Covering of the brain and spinal cord (meningitis). Meningitis can cause deafness and brain damage, and it can be fatal. Anyone can get pneumococcal disease, but children under 2 years of age, people with certain medical conditions, adults over 65 years of age, and cigarette smokers are at the highest risk. About 18,000 older adults die each year from pneumococcal disease in the [place]. Treatment of pneumococcal infections with penicillin and other drugs used to be more effective. But some strains of the disease have become resistant to these drugs. This makes prevention of the disease, through vaccination, even more important. 2. Pneumococcal polysaccharide vaccine (PPSV23) Pneumococcal polysaccharide vaccine (PPSV23) protects against 23 types of pneumococcal bacteria. It will not prevent all pneumococcal disease. PPSV23 is recommended for:  All adults 65 years of age and older,  Anyone 2 through 39 years of age with certain long-term health problems,  Anyone 2 through 39 years of age with a weakened immune system,  Adults 19 through 39 years of age who smoke cigarettes or have asthma. Most people need only one dose of PPSV. A second dose is recommended for certain high-risk groups. People 65 and older should get a dose even if they have gotten one or more doses of the vaccine before they turned 65. Your healthcare provider can give you more information about these recommendations. Most healthy adults develop protection within 2 to 3 weeks of getting the shot. 3. Some  people should not get this vaccine  Anyone who has had a life-threatening allergic reaction to PPSV should not get another dose.  Anyone who has a severe allergy to any component of PPSV should not receive it. Tell your provider if you have any severe allergies.  Anyone who is moderately or severely ill when the shot is scheduled may be asked to wait until they recover before getting the vaccine. Someone with a mild illness can usually be vaccinated.  Children less than 2 years of age should not receive this vaccine.  There is no evidence that PPSV is harmful to either a pregnant woman or to her fetus. However, as a precaution, women who need the vaccine should be vaccinated before becoming pregnant, if possible. 4. Risks of a vaccine reaction With any medicine, including vaccines, there is a chance of side effects. These are usually mild and go away on their own, but serious reactions are also possible. About half of people who get PPSV have mild side effects, such as redness or pain where the shot is given, which go away within about two days. Less than 1 out of 100 people develop a fever, muscle aches, or more severe local reactions. Problems that could happen after any vaccine:  People sometimes faint after a medical procedure, including vaccination. Sitting or lying down for about 15 minutes can help prevent fainting, and injuries caused by a fall. Tell your doctor if you feel dizzy, or have vision changes or ringing in the ears.  Some people get severe pain in the shoulder and have difficulty moving the arm where a shot was given. This happens very rarely.  Any medication can   cause a severe allergic reaction. Such reactions from a vaccine are very rare, estimated at about 1 in a million doses, and would happen within a few minutes to a few hours after the vaccination. As with any medicine, there is a very remote chance of a vaccine causing a serious injury or death. The safety of  vaccines is always being monitored. For more information, visit: http://www.aguilar.org/ 5. What if there is a serious reaction? What should I look for? Look for anything that concerns you, such as signs of a severe allergic reaction, very high fever, or unusual behavior. Signs of a severe allergic reaction can include hives, swelling of the face and throat, difficulty breathing, a fast heartbeat, dizziness, and weakness. These would usually start a few minutes to a few hours after the vaccination. What should I do? If you think it is a severe allergic reaction or other emergency that can't wait, call 9-1-1 or get to the nearest hospital. Otherwise, call your doctor. Afterward, the reaction should be reported to the Vaccine Adverse Event Reporting System (VAERS). Your doctor might file this report, or you can do it yourself through the VAERS web site at www.vaers.SamedayNews.es, or by calling (779)317-4524. VAERS does not give medical advice. 6. How can I learn more?  Ask your doctor. He or she can give you the vaccine package insert or suggest other sources of information.  Call your local or state health department.  Contact the Centers for Disease Control and Prevention (CDC):  Call 309-466-6799 (1-800-CDC-INFO) or  Visit CDC's website at http://hunter.com/ CDC Pneumococcal Polysaccharide Vaccine VIS (02/07/14) This information is not intended to replace advice given to you by your health care provider. Make sure you discuss any questions you have with your health care provider. --------------------------------------------------------------------------------------------------- Start Lac Hydrin plus Eucerin

## 2016-10-03 NOTE — Assessment & Plan Note (Signed)
Taking Lisinopril

## 2016-10-24 ENCOUNTER — Other Ambulatory Visit: Payer: Self-pay | Admitting: Unknown Physician Specialty

## 2016-11-16 LAB — HM DIABETES EYE EXAM

## 2016-11-17 ENCOUNTER — Encounter: Payer: Self-pay | Admitting: Family Medicine

## 2016-11-17 ENCOUNTER — Ambulatory Visit (INDEPENDENT_AMBULATORY_CARE_PROVIDER_SITE_OTHER): Payer: BC Managed Care – PPO | Admitting: Family Medicine

## 2016-11-17 VITALS — BP 118/75 | HR 91 | Temp 97.8°F | Ht 70.0 in | Wt 257.0 lb

## 2016-11-17 DIAGNOSIS — L509 Urticaria, unspecified: Secondary | ICD-10-CM

## 2016-11-17 MED ORDER — TRIAMCINOLONE ACETONIDE 40 MG/ML IJ SUSP
40.0000 mg | Freq: Once | INTRAMUSCULAR | Status: AC
  Administered 2016-11-17: 40 mg via INTRAMUSCULAR

## 2016-11-17 NOTE — Patient Instructions (Addendum)
Hives Hives (urticaria) are itchy, red, swollen areas on your skin. Hives can appear on any part of your body and can vary in size. They can be as small as the tip of a pen or much larger. Hives often fade within 24 hours (acute hives). In other cases, new hives appear after old ones fade. This cycle can continue for several days or weeks (chronic hives). Hives result from your body's reaction to an irritant or to something that you are allergic to (trigger). When you are exposed to a trigger, your body releases a chemical (histamine) that causes redness, itching, and swelling. You can get hives immediately after being exposed to a trigger or hours later. Hives do not spread from person to person (are not contagious). Your hives may get worse with scratching, exercise, and emotional stress. What are the causes? Causes of this condition include:  Allergies to certain foods or ingredients.  Insect bites or stings.  Exposure to pollen or pet dander.  Contact with latex or chemicals.  Spending time in sunlight, heat, or cold (exposure).  Exercise.  Stress. You can also get hives from some medical conditions and treatments. These include:  Viruses, including the common cold.  Bacterial infections, such as urinary tract infections and strep throat.  Disorders such as vasculitis, lupus, or thyroid disease.  Certain medications.  Allergy shots.  Blood transfusions. Sometimes, the cause of hives is not known (idiopathic hives). What increases the risk? This condition is more likely to develop in:  Women.  People who have food allergies, especially to citrus fruits, milk, eggs, peanuts, tree nuts, or shellfish.  People who are allergic to:  Medicines.  Latex.  Insects.  Animals.  Pollen.  People who have certain medical conditions, includinglupus or thyroid disease. What are the signs or symptoms? The main symptom of this condition is raised, itchyred or white bumps or  patches on your skin. These areas may:  Become large and swollen (welts).  Change in shape and location, quickly and repeatedly.  Be separate hives or connect over a large area of skin.  Sting or become painful.  Turn white when pressed in the center (blanch). In severe cases, yourhands, feet, and face may also become swollen. This may occur if hives develop deeper in your skin. How is this diagnosed? This condition is diagnosed based on your symptoms, medical history, and physical exam. Your skin, urine, or blood may be tested to find out what is causing your hives and to rule out other health issues. Your health care provider may also remove a small sample of skin from the affected area and examine it under a microscope (biopsy). How is this treated? Treatment depends on the severity of your condition. Your health care provider may recommend using cool, wet cloths (cool compresses) or taking cool showers to relieve itching. Hives are sometimes treated with medicines, including:  Antihistamines.  Corticosteroids.  Antibiotics.  An injectable medicine (omalizumab). Your health care provider may prescribe this if you have chronic idiopathic hives and you continue to have symptoms even after treatment with antihistamines. Severe cases may require an emergency injection of adrenaline (epinephrine) to prevent a life-threatening allergic reaction (anaphylaxis). Follow these instructions at home: Medicines  Take or apply over-the-counter and prescription medicines only as told by your health care provider.  If you were prescribed an antibiotic medicine, use it as told by your health care provider. Do not stop taking the antibiotic even if you start to feel better. Skin Care    Apply cool compresses to the affected areas.  Do not scratch or rub your skin. General instructions  Do not take hot showers or baths. This can make itching worse.  Do not wear tight-fitting clothing.  Use  sunscreen and wear protective clothing when you are outside.  Avoid any substances that cause your hives. Keep a journal to help you track what causes your hives. Write down:  What medicines you take.  What you eat and drink.  What products you use on your skin.  Keep all follow-up visits as told by your health care provider. This is important. Contact a health care provider if:  Your symptoms are not controlled with medicine.  Your joints are painful or swollen. Get help right away if:  You have a fever.  You have pain in your abdomen.  Your tongue or lips are swollen.  Your eyelids are swollen.  Your chest or throat feels tight.  You have trouble breathing or swallowing. These symptoms may represent a serious problem that is an emergency. Do not wait to see if the symptoms will go away. Get medical help right away. Call your local emergency services (911 in the [place] Do not drive yourself to the hospital.  This information is not intended to replace advice given to you by your health care provider. Make sure you discuss any questions you have with your health care provider. Document Released: 10/03/2005 Document Revised: 03/02/2016 Document Reviewed: 07/22/2015 Elsevier Interactive Patient Education  2017 [employer]vier Inc.  

## 2016-11-17 NOTE — Progress Notes (Signed)
BP 118/75   Pulse 91   Temp 97.8 F (36.6 C) (Oral)   Ht 5\' 10"  (1.778 m)   Wt 257 lb (116.6 kg)   SpO2 97%   BMI 36.88 kg/m    Subjective:    Patient ID: [name], female    DOB: Aug 08, 1976, 41 y.o.   MRN: [id number]  HPI: [name] is a 41 y.o. female  Chief Complaint  Patient presents with  . Urticaria    Hot, itchy x 3 days   RASH Duration: 3 days   Location: face  Itching: yes Burning: yes Redness: yes Oozing: no Scaling: no Blisters: no Painful: yes Fevers: no Change in detergents/soaps/personal care products: no Recent illness: no Recent travel:no History of same: no Context: worse Alleviating factors: nothing Treatments attempted:nothing Shortness of breath: no  Throat/tongue swelling: no Myalgias/arthralgias: no  Relevant past medical, surgical, family and social history reviewed and updated as indicated. Interim medical history since our last visit reviewed. Allergies and medications reviewed and updated.  Review of Systems  Constitutional: Negative.   Respiratory: Negative.   Cardiovascular: Negative.   Skin: Positive for rash. Negative for color change, pallor and wound.  Psychiatric/Behavioral: Negative.     Per HPI unless specifically indicated above     Objective:    BP 118/75   Pulse 91   Temp 97.8 F (36.6 C) (Oral)   Ht 5\' 10"  (1.778 m)   Wt 257 lb (116.6 kg)   SpO2 97%   BMI 36.88 kg/m   Wt Readings from Last 3 Encounters:  11/17/16 257 lb (116.6 kg)  10/03/16 264 lb 3.2 oz (119.8 kg)  09/02/16 272 lb 3.2 oz (123.5 kg)    Physical Exam  Constitutional: She is oriented to person, place, and time. She appears well-developed and well-nourished. No distress.  HENT:  Head: Normocephalic and atraumatic.  Right Ear: Hearing normal.  Left Ear: Hearing normal.  Nose: Nose normal.  Eyes: Conjunctivae and lids are normal. Right eye exhibits no discharge. Left eye exhibits no discharge. No scleral icterus.    Pulmonary/Chest: Effort normal. No respiratory distress.  Musculoskeletal: Normal range of motion.  Neurological: She is alert and oriented to person, place, and time.  Skin: Skin is warm, dry and intact. Rash noted. There is erythema. No pallor.  Hives on her face  Psychiatric: She has a normal mood and affect. Her speech is normal and behavior is normal. Judgment and thought content normal. Cognition and memory are normal.  Nursing note and vitals reviewed.   Results for orders placed or performed in visit on 09/02/16  Comprehensive metabolic panel  Result Value Ref Range   Glucose 170 (H) 65 - 99 mg/dL   BUN 12 6 - 20 mg/dL   Creatinine, Ser 0.84 0.57 - 1.00 mg/dL   GFR calc non Af Amer 88 >59 mL/min/1.73   GFR calc Af Amer 102 >59 mL/min/1.73   BUN/Creatinine Ratio 14 9 - 23   Sodium 138 134 - 144 mmol/L   Potassium 4.1 3.5 - 5.2 mmol/L   Chloride 101 96 - 106 mmol/L   CO2 18 18 - 29 mmol/L   Calcium 9.4 8.7 - 10.2 mg/dL   Total Protein 7.2 6.0 - 8.5 g/dL   Albumin 4.5 3.5 - 5.5 g/dL   Globulin, Total 2.7 1.5 - 4.5 g/dL   Albumin/Globulin Ratio 1.7 1.2 - 2.2   Bilirubin Total 0.3 0.0 - 1.2 mg/dL   Alkaline Phosphatase 82 39 -  117 IU/L   AST 29 0 - 40 IU/L   ALT 24 0 - 32 IU/L  Lipid Panel Piccolo, Waived  Result Value Ref Range   Cholesterol Piccolo, Waived 109 <200 mg/dL   HDL Chol Piccolo, Waived 28 (L) >59 mg/dL   Triglycerides Piccolo,Waived 272 (H) <150 mg/dL   Chol/HDL Ratio Piccolo,Waive 4.0 mg/dL   LDL Chol Calc Piccolo Waived 27 <100 mg/dL   VLDL Chol Calc Piccolo,Waive 54 (H) <30 mg/dL  Microalbumin, Urine Waived  Result Value Ref Range   Microalb, Ur Waived 80 (H) 0 - 19 mg/L   Creatinine, Urine Waived 100 10 - 300 mg/dL   Microalb/Creat Ratio 30-300 (H) <30 mg/g  UA/M w/rflx Culture, Routine  Result Value Ref Range   Specific Gravity, UA 1.010 1.005 - 1.030   pH, UA 6.0 5.0 - 7.5   Color, UA Yellow Yellow   Appearance Ur Clear Clear   Leukocytes, UA  Negative Negative   Protein, UA Negative Negative/Trace   Glucose, UA Negative Negative   Ketones, UA Negative Negative   RBC, UA Negative Negative   Bilirubin, UA Negative Negative   Urobilinogen, Ur 0.2 0.2 - 1.0 mg/dL   Nitrite, UA Negative Negative      Assessment & Plan:   Problem List Items Addressed This Visit    None    Visit Diagnoses    Urticaria    -  Primary   Kenalog shot given today. Will start OTC allegra and benadryl PRN at night. Call if not getting better.    Relevant Medications   triamcinolone acetonide (KENALOG-40) injection 40 mg (Start on 11/17/2016  5:00 PM)       Follow up plan: Return As scheduled.

## 2016-11-18 ENCOUNTER — Telehealth: Payer: Self-pay | Admitting: Family Medicine

## 2016-11-18 MED ORDER — PREDNISONE 10 MG PO TABS: ORAL_TABLET | ORAL | 0 refills | Status: DC

## 2016-11-18 NOTE — Telephone Encounter (Signed)
Patient returned my call and was notified about medication and sugars.

## 2016-11-18 NOTE — Telephone Encounter (Signed)
Routing to provider  

## 2016-11-18 NOTE — Telephone Encounter (Signed)
Called and left patient a VM asking for her to please return my call.  

## 2016-11-18 NOTE — Telephone Encounter (Signed)
Steroid taper sent to her pharmacy. She should be aware that this will make her sugars go up.

## 2016-11-18 NOTE — Telephone Encounter (Signed)
Pt called and stated that her face is still broken out in hives and she would like to know if there is anything else she can do.

## 2016-11-20 ENCOUNTER — Other Ambulatory Visit: Payer: Self-pay | Admitting: Family Medicine

## 2016-11-20 ENCOUNTER — Other Ambulatory Visit: Payer: Self-pay | Admitting: Unknown Physician Specialty

## 2016-11-21 NOTE — Telephone Encounter (Signed)
Due to be seen

## 2016-11-21 NOTE — Telephone Encounter (Signed)
Your patient 

## 2016-11-21 NOTE — Telephone Encounter (Signed)
Patient already has appointment scheduled for 12/05/16.

## 2016-11-28 ENCOUNTER — Ambulatory Visit: Payer: BC Managed Care – PPO | Admitting: Podiatry

## 2016-11-30 ENCOUNTER — Telehealth: Payer: Self-pay | Admitting: Unknown Physician Specialty

## 2016-11-30 NOTE — Telephone Encounter (Signed)
If someone needs a prescription cough medication I would like to see them to assess the cause of the cough

## 2016-11-30 NOTE — Telephone Encounter (Signed)
Routing to provider  

## 2016-11-30 NOTE — Telephone Encounter (Signed)
Please call and let patient know that Malachy Mood would like to see her.

## 2016-11-30 NOTE — Telephone Encounter (Signed)
Pt has a bad cough and would like to know if she could have something sent in to [facility]

## 2016-12-01 ENCOUNTER — Encounter: Payer: Self-pay | Admitting: Family Medicine

## 2016-12-01 ENCOUNTER — Ambulatory Visit: Payer: BC Managed Care – PPO | Admitting: Podiatry

## 2016-12-01 ENCOUNTER — Ambulatory Visit (INDEPENDENT_AMBULATORY_CARE_PROVIDER_SITE_OTHER): Payer: BC Managed Care – PPO | Admitting: Family Medicine

## 2016-12-01 ENCOUNTER — Encounter: Payer: Self-pay | Admitting: Unknown Physician Specialty

## 2016-12-01 VITALS — BP 101/64 | HR 101 | Temp 98.3°F | Wt 246.0 lb

## 2016-12-01 DIAGNOSIS — J069 Acute upper respiratory infection, unspecified: Secondary | ICD-10-CM | POA: Diagnosis not present

## 2016-12-01 LAB — VERITOR FLU A/B WAIVED
INFLUENZA B: NEGATIVE
Influenza A: NEGATIVE

## 2016-12-01 MED ORDER — AZITHROMYCIN 250 MG PO TABS: ORAL_TABLET | ORAL | 0 refills | Status: DC

## 2016-12-01 MED ORDER — HYDROCOD POLST-CPM POLST ER 10-8 MG/5ML PO SUER: 5.0000 mL | Freq: Two times a day (BID) | ORAL | 0 refills | Status: DC | PRN

## 2016-12-01 MED ORDER — BENZONATATE 100 MG PO CAPS: 200.0000 mg | ORAL_CAPSULE | Freq: Three times a day (TID) | ORAL | 0 refills | Status: DC | PRN

## 2016-12-01 MED ORDER — ALBUTEROL SULFATE HFA 108 (90 BASE) MCG/ACT IN AERS: 2.0000 | INHALATION_SPRAY | Freq: Four times a day (QID) | RESPIRATORY_TRACT | 6 refills | Status: DC | PRN

## 2016-12-01 NOTE — Progress Notes (Signed)
   BP 101/64   Pulse (!) 101   Temp 98.3 F (36.8 C)   Wt 246 lb (111.6 kg)   SpO2 95%   BMI 35.30 kg/m    Subjective:    Patient ID: [name], female    DOB: 1976-05-06, 41 y.o.   MRN: [id number]  HPI: [name] is a 41 y.o. female  Chief Complaint  Patient presents with  . URI    x 5 days, productive cough x 1 day, body aches (but normal), bilateral ear ache. head/chest congestion, sore throat, No fever.   Fatigue, sore throat, ear pain, productive cough that has been worsening. Now having some SOB, wheezing since this morning. No fever, chills, N/V, but has had some body aches (more than baseline). Taking coricidin HBP and using her albuterol inhaler regularly with some relief. 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   Relevant past medical, surgical, family and social history reviewed and updated as indicated. Interim medical history since our last visit reviewed. Allergies and medications reviewed and updated.  Review of Systems  Constitutional: Positive for fatigue.  HENT: Positive for congestion, ear pain and sore throat.   Eyes: Negative.   Respiratory: Positive for cough, shortness of breath and wheezing.   Cardiovascular: Negative.   Gastrointestinal: Negative.   Genitourinary: Negative.   Musculoskeletal: Positive for arthralgias and myalgias.  Neurological: Negative.   Psychiatric/Behavioral: Negative.     Per HPI unless specifically indicated above     Objective:    BP 101/64   Pulse (!) 101   Temp 98.3 F (36.8 C)   Wt 246 lb (111.6 kg)   SpO2 95%   BMI 35.30 kg/m   Wt Readings from Last 3 Encounters:  12/01/16 246 lb (111.6 kg)  11/17/16 257 lb (116.6 kg)  10/03/16 264 lb 3.2 oz (119.8 kg)    Physical Exam  Constitutional: She is oriented to person, place, and time. She appears well-developed and well-nourished. No distress.  HENT:  Head: Atraumatic.  Nose: Nose normal.  Oropharynx erythematous and edematous Right TM  injected  Eyes: Conjunctivae are normal. Pupils are equal, round, and reactive to light.  Neck: Normal range of motion. Neck supple.  Cardiovascular: Normal rate and normal heart sounds.   Pulmonary/Chest: Effort normal. She has wheezes (worst on left). She has no rales.  Musculoskeletal: Normal range of motion.  Lymphadenopathy:    She has cervical adenopathy.  Neurological: She is alert and oriented to person, place, and time.  Skin: Skin is warm and dry.  Psychiatric: She has a normal mood and affect. Her behavior is normal.  Nursing note and vitals reviewed.     Assessment & Plan:   Problem List Items Addressed This Visit    None    Visit Diagnoses    Upper respiratory tract infection, unspecified type    -  Primary   Rapid flu negative, will treat with azithromycin, tessalon, tussionex, and albuterol inhaler. Supportive care discussed. Follow up as scheduled next week   Relevant Medications   azithromycin (ZITHROMAX) 250 MG tablet   Other Relevant Orders   Influenza A & B (STAT)       Follow up plan: Return for as scheduled.

## 2016-12-01 NOTE — Patient Instructions (Signed)
Follow up as scheduled.  

## 2016-12-01 NOTE — Telephone Encounter (Signed)
LMOM

## 2016-12-05 ENCOUNTER — Ambulatory Visit (INDEPENDENT_AMBULATORY_CARE_PROVIDER_SITE_OTHER): Payer: BC Managed Care – PPO | Admitting: Unknown Physician Specialty

## 2016-12-05 ENCOUNTER — Encounter: Payer: Self-pay | Admitting: Unknown Physician Specialty

## 2016-12-05 VITALS — BP 120/73 | HR 87 | Temp 98.5°F | Wt 248.6 lb

## 2016-12-05 DIAGNOSIS — I214 Non-ST elevation (NSTEMI) myocardial infarction: Secondary | ICD-10-CM

## 2016-12-05 DIAGNOSIS — I10 Essential (primary) hypertension: Secondary | ICD-10-CM

## 2016-12-05 DIAGNOSIS — E1165 Type 2 diabetes mellitus with hyperglycemia: Secondary | ICD-10-CM | POA: Diagnosis not present

## 2016-12-05 DIAGNOSIS — Z720 Tobacco use: Secondary | ICD-10-CM

## 2016-12-05 DIAGNOSIS — R079 Chest pain, unspecified: Secondary | ICD-10-CM

## 2016-12-05 NOTE — Progress Notes (Signed)
BP 120/73 (BP Location: Left Arm, Patient Position: Sitting, Cuff Size: Large)   Pulse 87   Temp 98.5 F (36.9 C)   Wt 248 lb 9.6 oz (112.8 kg)   LMP 11/28/2016 (Exact Date)   SpO2 96%   BMI 35.67 kg/m    Subjective:    Patient ID: [name], female    DOB: 03/16/1976, 41 y.o.   MRN: [id number]  HPI: [name] is a 41 y.o. female  Chief Complaint  Patient presents with  . Follow-up    2 month f/up   This is patients 1st visit following heart attack x 2.  Cholesterol is good but high Triglycerides.  Last ate 1 p.  Her LDL is 54.  Triglycerides are high.  She is still smoking.  She is seeing Dr. Humphrey Rolls.  However, discharge notes state no ACS or non-STEMI.    Diabetes She was tested positive for DM and put on Jardance and Metformin.  She has changed her diet significantly with lean meat and no carbs.    Social History   Social History  . Marital status: Single    Spouse name: N/A  . Number of children: N/A  . Years of education: N/A   Occupational History  . Not on file.   Social History Main Topics  . Smoking status: Current Every Day Smoker    Packs/day: 0.50    Years: 20.00    Types: Cigarettes  . Smokeless tobacco: Never Used  . Alcohol use No  . Drug use: No  . Sexual activity: Yes   Other Topics Concern  . Not on file   Social History Narrative  . No narrative on file    Relevant past medical, surgical, family and social history reviewed and updated as indicated. Interim medical history since our last visit reviewed. Allergies and medications reviewed and updated.  Review of Systems  Per HPI unless specifically indicated above     Objective:    BP 120/73 (BP Location: Left Arm, Patient Position: Sitting, Cuff Size: Large)   Pulse 87   Temp 98.5 F (36.9 C)   Wt 248 lb 9.6 oz (112.8 kg)   LMP 11/28/2016 (Exact Date)   SpO2 96%   BMI 35.67 kg/m   Wt Readings from Last 3 Encounters:  12/05/16 248 lb 9.6 oz (112.8 kg)    12/01/16 246 lb (111.6 kg)  11/17/16 257 lb (116.6 kg)    Physical Exam  Constitutional: She is oriented to person, place, and time. She appears well-developed and well-nourished. No distress.  HENT:  Head: Normocephalic and atraumatic.  Eyes: Conjunctivae and lids are normal. Right eye exhibits no discharge. Left eye exhibits no discharge. No scleral icterus.  Neck: Normal range of motion. Neck supple. No JVD present. Carotid bruit is not present.  Cardiovascular: Normal rate, regular rhythm and normal heart sounds.   Pulmonary/Chest: Effort normal and breath sounds normal.  Abdominal: Normal appearance. There is no splenomegaly or hepatomegaly.  Musculoskeletal: Normal range of motion.  Neurological: She is alert and oriented to person, place, and time.  Skin: Skin is warm, dry and intact. No rash noted. No pallor.  Psychiatric: She has a normal mood and affect. Her behavior is normal. Judgment and thought content normal.    Results for orders placed or performed in visit on 11/25/16  HM DIABETES EYE EXAM  Result Value Ref Range   HM Diabetic Eye Exam No Retinopathy No Retinopathy      Assessment &  Plan:   Problem List Items Addressed This Visit      Unprioritized   Chest pain    History of chext pain x2 with elevated Troponin negative cardiac work-up      Hypertension   RESOLVED: NSTEMI (non-ST elevated myocardial infarction) ([place])    W/u was negative upon chart review      Poorly controlled type 2 diabetes mellitus ([place]) - Primary   Relevant Orders   Lipid Panel w/o Chol/HDL Ratio   Comprehensive metabolic panel   Hgb 123456 w/o eAG   Tobacco use    Encouraged to quet           Follow up plan: Return in about 3 months (around 03/04/2017).

## 2016-12-05 NOTE — Assessment & Plan Note (Signed)
Encouraged to quet

## 2016-12-05 NOTE — Assessment & Plan Note (Signed)
History of chext pain x2 with elevated Troponin negative cardiac work-up

## 2016-12-05 NOTE — Assessment & Plan Note (Signed)
W/u was negative upon chart review

## 2016-12-06 LAB — LIPID PANEL W/O CHOL/HDL RATIO
CHOLESTEROL TOTAL: 143 mg/dL (ref 100–199)
HDL: 30 mg/dL — ABNORMAL LOW (ref >39)
LDL Calculated: 71 mg/dL (ref 0–99)
Triglycerides: 208 mg/dL — ABNORMAL HIGH (ref 0–149)
VLDL CHOLESTEROL CAL: 42 mg/dL — AB (ref 5–40)

## 2016-12-06 LAB — COMPREHENSIVE METABOLIC PANEL
A/G RATIO: 2.4 — AB (ref 1.2–2.2)
ALT: 17 IU/L (ref 0–32)
AST: 14 IU/L (ref 0–40)
Albumin: 4.8 g/dL (ref 3.5–5.5)
Alkaline Phosphatase: 70 IU/L (ref 39–117)
BUN/Creatinine Ratio: 20 (ref 9–23)
BUN: 15 mg/dL (ref 6–20)
Bilirubin Total: 0.3 mg/dL (ref 0.0–1.2)
CALCIUM: 9.4 mg/dL (ref 8.7–10.2)
CO2: 20 mmol/L (ref 18–29)
Chloride: 97 mmol/L (ref 96–106)
Creatinine, Ser: 0.75 mg/dL (ref 0.57–1.00)
GFR calc non Af Amer: 101 mL/min/{1.73_m2} (ref >59)
GFR, EST AFRICAN AMERICAN: 116 mL/min/{1.73_m2} (ref >59)
GLOBULIN, TOTAL: 2 g/dL (ref 1.5–4.5)
Glucose: 76 mg/dL (ref 65–99)
POTASSIUM: 4.8 mmol/L (ref 3.5–5.2)
SODIUM: 138 mmol/L (ref 134–144)
TOTAL PROTEIN: 6.8 g/dL (ref 6.0–8.5)

## 2016-12-06 LAB — HGB A1C W/O EAG: Hgb A1c MFr Bld: 6.1 % — ABNORMAL HIGH (ref 4.8–5.6)

## 2016-12-15 ENCOUNTER — Other Ambulatory Visit: Payer: Self-pay | Admitting: Unknown Physician Specialty

## 2017-01-02 IMAGING — CR DG CHEST 2V
2 series · 2 of 2 positions shown · non-contrast
Comparison: 07/28/2015 and 04/09/2014.

CLINICAL DATA: Generalized chest pain with shortness of breath and
congestion for 1 week.

EXAM:
CHEST  2 VIEW

[chest pa]
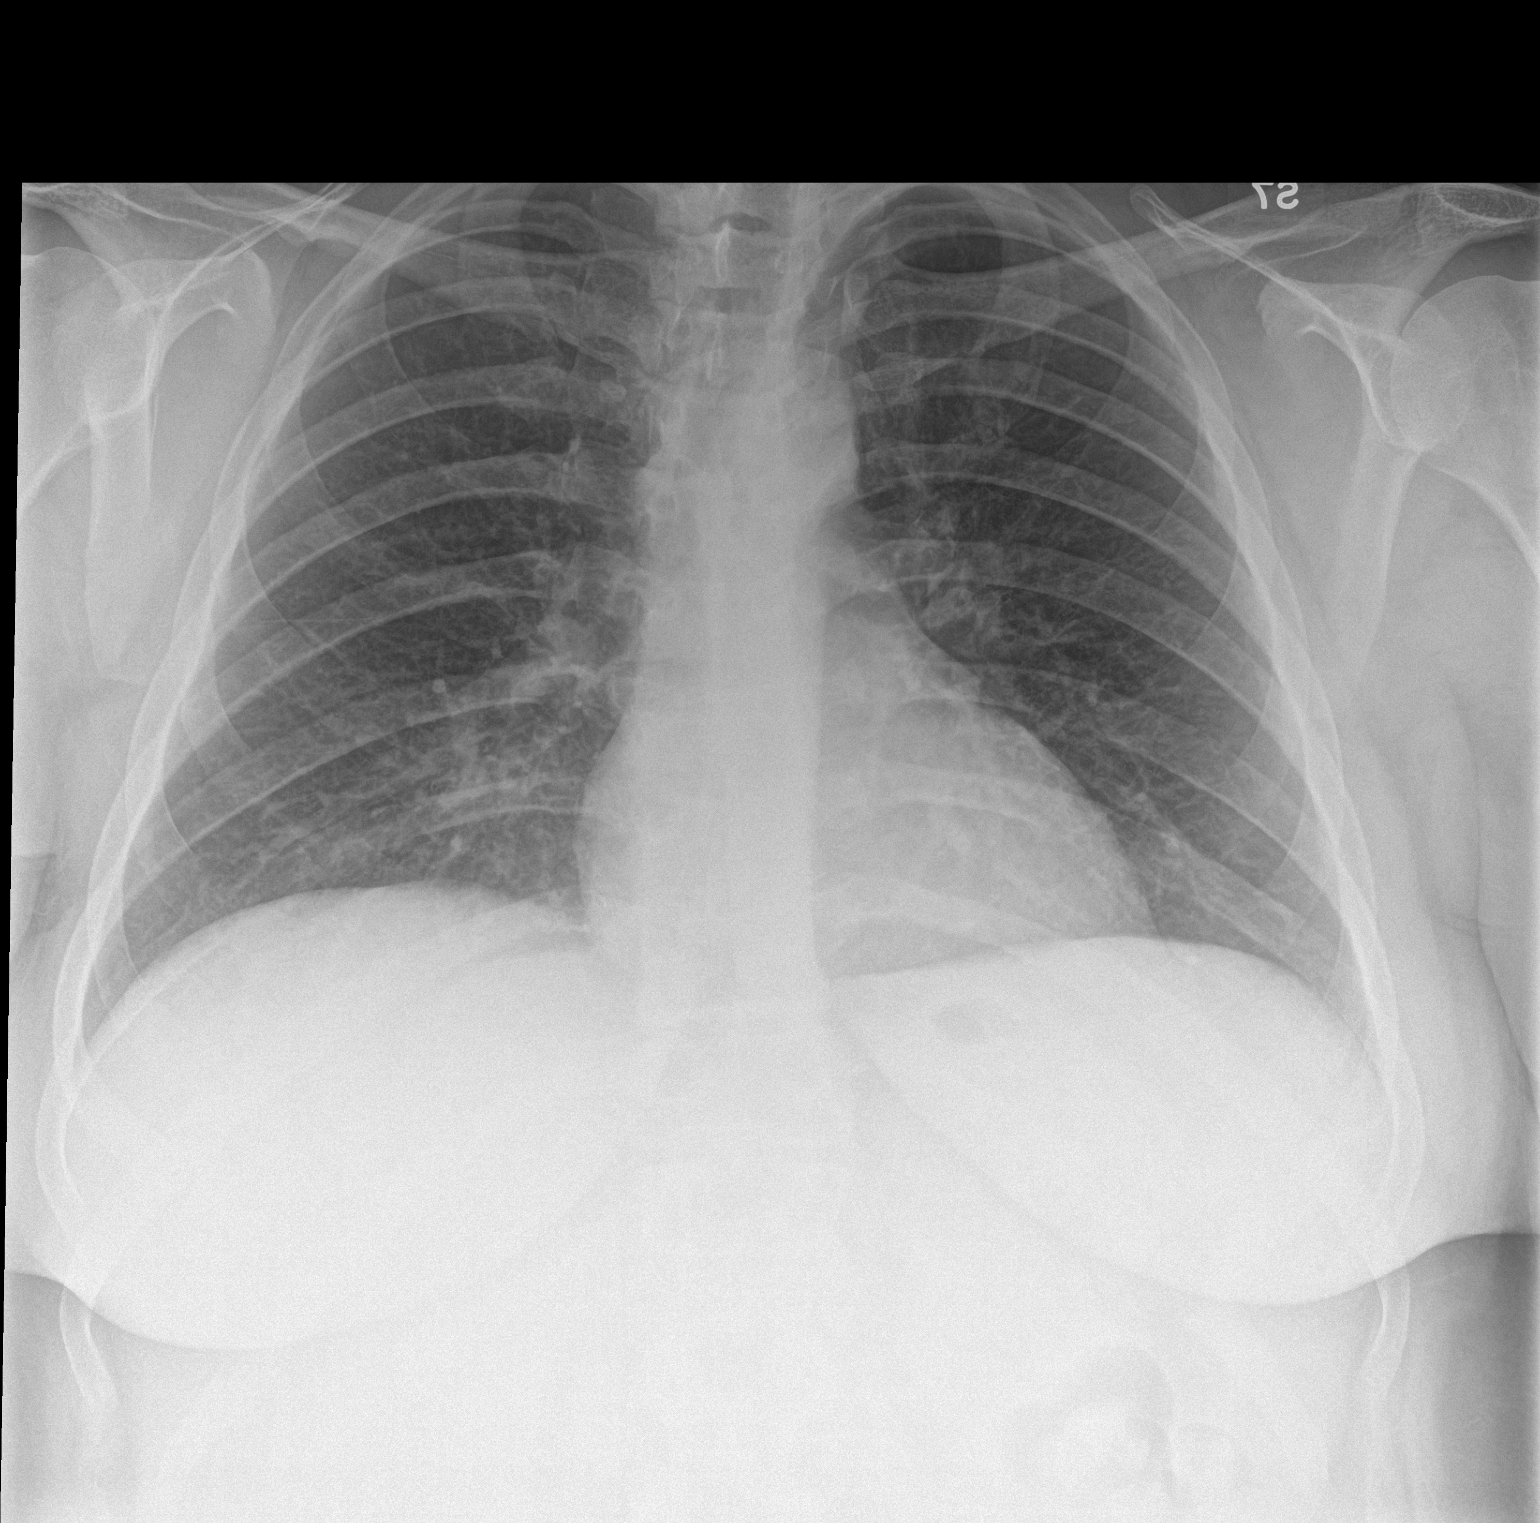

[chest lat]
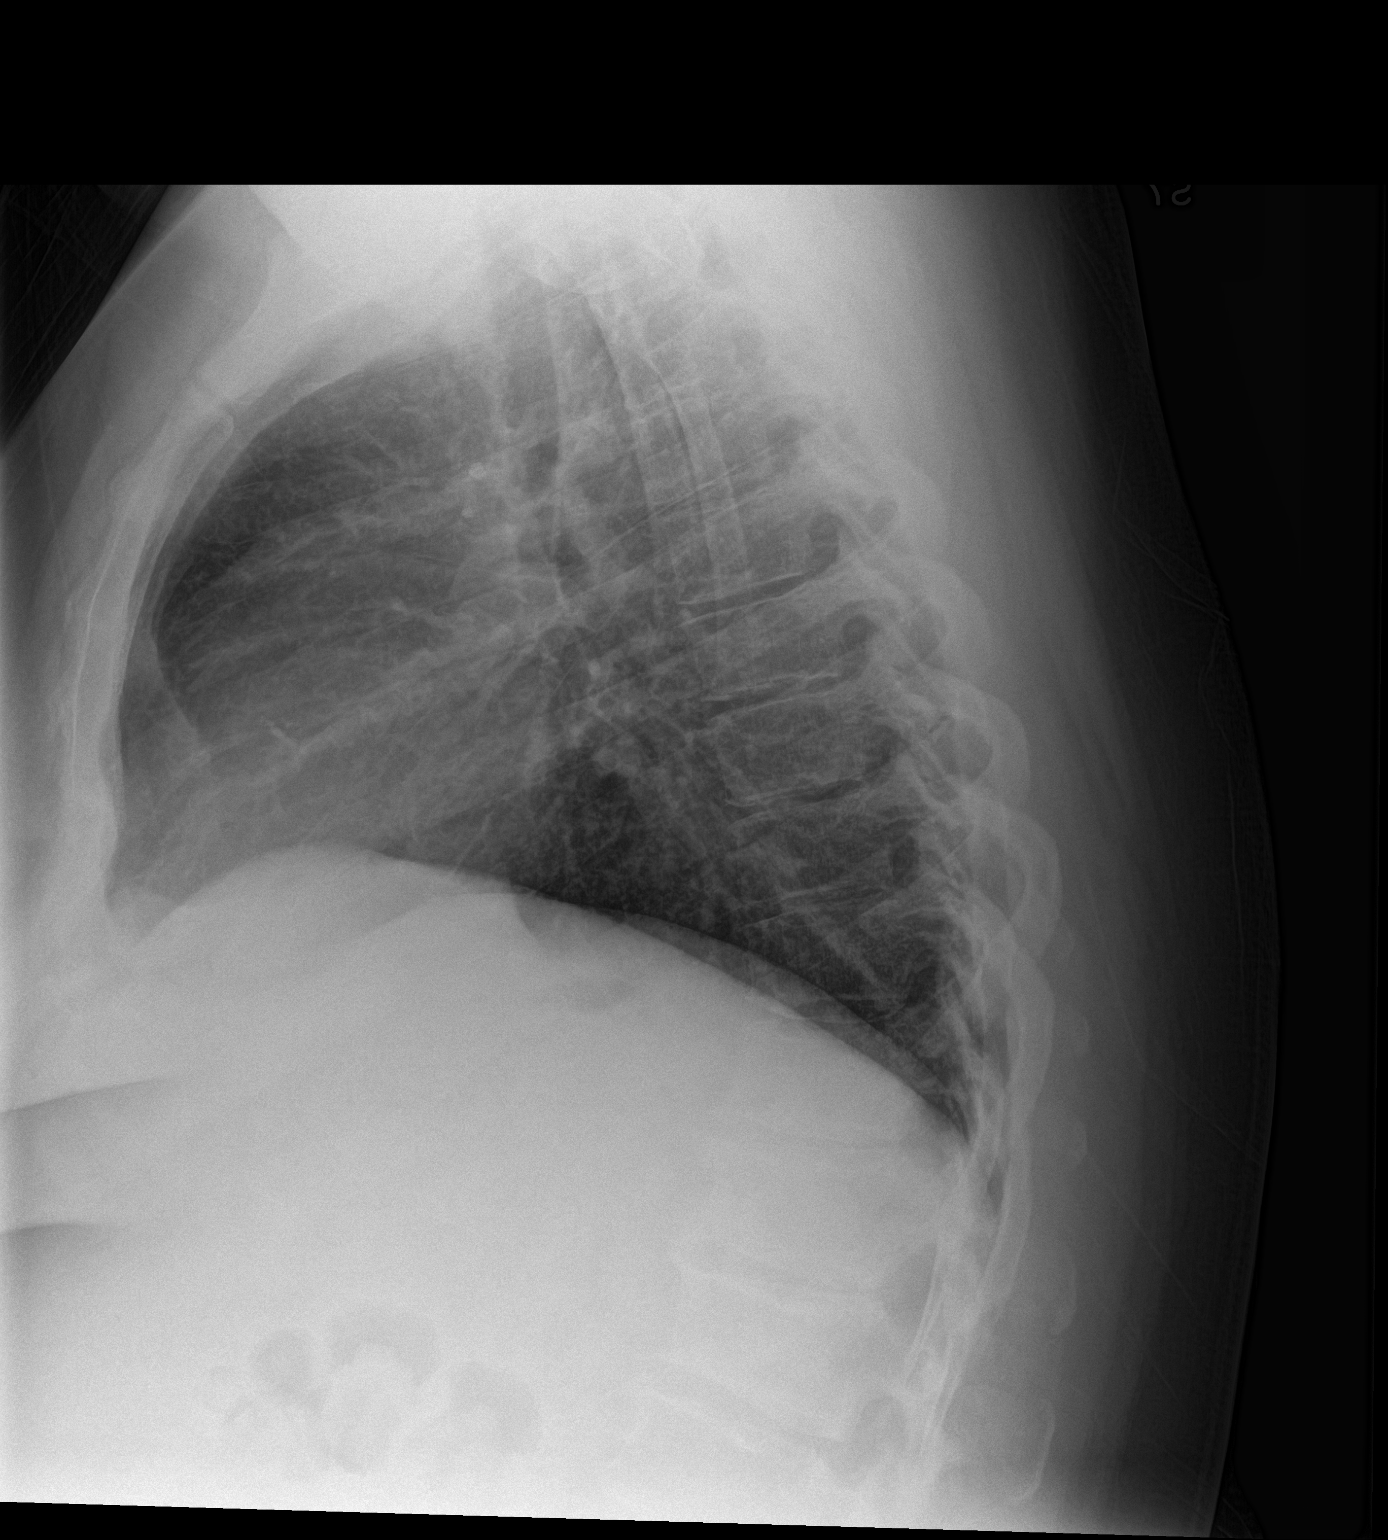

[2 of 2 positions shown; findings below may reference images not displayed]

FINDINGS: The heart size and mediastinal contours are normal. The lungs are
clear. There is no pleural effusion or pneumothorax. No acute
osseous findings are identified.
IMPRESSION: Stable chest.  No active cardiopulmonary process.

## 2017-02-26 ENCOUNTER — Other Ambulatory Visit: Payer: Self-pay | Admitting: Unknown Physician Specialty

## 2017-03-17 ENCOUNTER — Ambulatory Visit: Payer: BC Managed Care – PPO | Admitting: Unknown Physician Specialty

## 2017-03-28 ENCOUNTER — Other Ambulatory Visit: Payer: Self-pay | Admitting: Unknown Physician Specialty

## 2017-04-02 ENCOUNTER — Other Ambulatory Visit: Payer: Self-pay | Admitting: Family Medicine

## 2017-04-03 ENCOUNTER — Other Ambulatory Visit: Payer: Self-pay | Admitting: Unknown Physician Specialty

## 2017-04-03 MED ORDER — EMPAGLIFLOZIN 10 MG PO TABS: 10.0000 mg | ORAL_TABLET | Freq: Every day | ORAL | 0 refills | Status: DC

## 2017-04-03 MED ORDER — LISINOPRIL 5 MG PO TABS: 5.0000 mg | ORAL_TABLET | Freq: Every day | ORAL | 0 refills | Status: DC

## 2017-04-03 NOTE — Telephone Encounter (Signed)
Routing to provider for clarification. OV note says to return in 3 months, but mychart message to patient about labs says to return in 6 months so patient cancelled her 3 month f/up appointment. When did you want to see the patient back?

## 2017-04-03 NOTE — Telephone Encounter (Signed)
Tried calling patient, no answer and VM box was full so I was unable to leave a message. Will try to call again later.  

## 2017-04-03 NOTE — Telephone Encounter (Signed)
Your patient 

## 2017-04-03 NOTE — Telephone Encounter (Signed)
Tried calling patient again, no answer and VM box was still full. Will try to call again tomorrow.

## 2017-04-03 NOTE — Telephone Encounter (Signed)
Patient is unable to get a refill on her medication for Jardiance and lisinopril at the pharmacy. Pharmacy informed patient that she needs an appointment for refill. Patient stated that her my-chart notes said to follow-up in 6 months and she saw that her previous appointment was scheduled for less than 6 months so she canceled it.   Patient would like to know if she can medication refilled or if she has to come in for appointment.  Please Advise.  Thank you

## 2017-04-03 NOTE — Telephone Encounter (Signed)
I will send in a month worth of meds.  She nees to be seen

## 2017-04-04 NOTE — Telephone Encounter (Signed)
Tried calling patient. No answer and VM box was still full. Will try to call again later.

## 2017-04-05 NOTE — Telephone Encounter (Signed)
Letter generated and sent to patient to schedule follow up appointment.

## 2017-04-28 ENCOUNTER — Other Ambulatory Visit: Payer: Self-pay | Admitting: Unknown Physician Specialty

## 2017-05-05 ENCOUNTER — Ambulatory Visit (INDEPENDENT_AMBULATORY_CARE_PROVIDER_SITE_OTHER): Payer: BC Managed Care – PPO | Admitting: Family Medicine

## 2017-05-05 ENCOUNTER — Encounter: Payer: Self-pay | Admitting: Family Medicine

## 2017-05-05 VITALS — BP 126/77 | HR 66 | Temp 98.5°F | Wt 235.1 lb

## 2017-05-05 DIAGNOSIS — I129 Hypertensive chronic kidney disease with stage 1 through stage 4 chronic kidney disease, or unspecified chronic kidney disease: Secondary | ICD-10-CM | POA: Diagnosis not present

## 2017-05-05 DIAGNOSIS — E1165 Type 2 diabetes mellitus with hyperglycemia: Secondary | ICD-10-CM | POA: Diagnosis not present

## 2017-05-05 DIAGNOSIS — N181 Chronic kidney disease, stage 1: Secondary | ICD-10-CM

## 2017-05-05 DIAGNOSIS — Z8739 Personal history of other diseases of the musculoskeletal system and connective tissue: Secondary | ICD-10-CM

## 2017-05-05 DIAGNOSIS — E1122 Type 2 diabetes mellitus with diabetic chronic kidney disease: Secondary | ICD-10-CM | POA: Diagnosis not present

## 2017-05-05 MED ORDER — LISINOPRIL 5 MG PO TABS: 5.0000 mg | ORAL_TABLET | Freq: Every day | ORAL | 1 refills | Status: DC

## 2017-05-05 MED ORDER — EMPAGLIFLOZIN 10 MG PO TABS: 10.0000 mg | ORAL_TABLET | Freq: Every day | ORAL | 1 refills | Status: DC

## 2017-05-05 MED ORDER — PANTOPRAZOLE SODIUM 40 MG PO TBEC: 40.0000 mg | DELAYED_RELEASE_TABLET | Freq: Two times a day (BID) | ORAL | 3 refills | Status: DC

## 2017-05-05 MED ORDER — METFORMIN HCL 500 MG PO TABS: ORAL_TABLET | ORAL | 1 refills | Status: DC

## 2017-05-05 MED ORDER — NAPROXEN 500 MG PO TABS: 500.0000 mg | ORAL_TABLET | Freq: Two times a day (BID) | ORAL | 3 refills | Status: DC

## 2017-05-05 NOTE — Assessment & Plan Note (Signed)
Checking labs today, await results.  

## 2017-05-05 NOTE — Assessment & Plan Note (Signed)
Under good control with A1c of 5.4! Continue diet and exercise and current regimen. If this good in 3 months, will reduce medication.

## 2017-05-05 NOTE — Assessment & Plan Note (Signed)
+   family history of RA- will check labs to make sure it's not RA. Await results. Will start naproxen. Call with any concerns.

## 2017-05-05 NOTE — Assessment & Plan Note (Signed)
Under good control. Continue current regimen. Continue to monitor. Call with any concerns. 

## 2017-05-05 NOTE — Progress Notes (Signed)
BP 126/77 (BP Location: Left Arm, Patient Position: Sitting, Cuff Size: Large)   Pulse 66   Temp 98.5 F (36.9 C)   Wt 235 lb 2 oz (106.7 kg)   SpO2 100%   BMI 33.74 kg/m    Subjective:    Patient ID: [name], female    DOB: 1976/02/28, 41 y.o.   MRN: [id number]  HPI: [name] is a 41 y.o. female  Chief Complaint  Patient presents with  . Hypertension  . Diabetes  . Joint Pain   HYPERTENSION Hypertension status: controlled  Satisfied with current treatment? yes Duration of hypertension: chronic BP monitoring frequency:  not checking BP medication side effects:  no Medication compliance: excellent compliance Aspirin: no Recurrent headaches: no Visual changes: no Palpitations: no Dyspnea: no Chest pain: no Lower extremity edema: no Dizzy/lightheaded: no  DIABETES Hypoglycemic episodes:no Polydipsia/polyuria: no Visual disturbance: no Chest pain: no Paresthesias: yes Glucose Monitoring: yes Taking Insulin?: no Blood Pressure Monitoring: not checking Retinal Examination: Up to Date Foot Exam: Up to Date Diabetic Education: Completed Pneumovax: Up to Date Influenza: Up to Date Aspirin: no  ARTHRALGIAS / JOINT ACHES Duration: 2 months, around the time she became more active and started exercising again. 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 She has a very high pain tolerance, so it doesn't usually bother her, but it's been acting up more recently Pain: yes Symmetric: yes Severe: moderate Quality: aching Frequency: intermittent Context:  worse Decreased function/range of motion: yes  Erythema: yes Swelling: yes Heat or warmth: yes Morning stiffness: yes Aggravating factors: being active Alleviating factors: ibuprofen, ice, heat Relief with NSAIDs?: mild Treatments attempted:  rest, ice, heat and ibuprofen  Involved Joints:     Hands: yes right    Wrists: yes bilateral     Elbows: yes bilateral    Shoulders: no   Back: yes     Hips: no     Knees: yes bilateral    Ankles: can't feel from her ankles down      Feet:  can't feel from her ankles down    Relevant past medical, surgical, family and social history reviewed and updated as indicated. Interim medical history since our last visit reviewed. Allergies and medications reviewed and updated.  Review of Systems  Constitutional: Negative.   Respiratory: Negative.   Cardiovascular: Negative.   Musculoskeletal: Positive for arthralgias, back pain and joint swelling. Negative for gait problem, myalgias, neck pain and neck stiffness.  Psychiatric/Behavioral: Negative.     Per HPI unless specifically indicated above     Objective:    BP 126/77 (BP Location: Left Arm, Patient Position: Sitting, Cuff Size: Large)   Pulse 66   Temp 98.5 F (36.9 C)   Wt 235 lb 2 oz (106.7 kg)   SpO2 100%   BMI 33.74 kg/m   Wt Readings from Last 3 Encounters:  05/05/17 235 lb 2 oz (106.7 kg)  12/05/16 248 lb 9.6 oz (112.8 kg)  12/01/16 246 lb (111.6 kg)    Physical Exam  Constitutional: She is oriented to person, place, and time. She appears well-developed and well-nourished. No distress.  HENT:  Head: Normocephalic and atraumatic.  Right Ear: Hearing normal.  Left Ear: Hearing normal.  Nose: Nose normal.  Eyes: Conjunctivae and lids are normal. Right eye exhibits no discharge. Left eye exhibits no discharge. No scleral icterus.  Cardiovascular: Normal rate, regular rhythm, normal heart sounds and intact distal pulses.  Exam reveals no  gallop and no friction rub.   No murmur heard. Pulmonary/Chest: Effort normal and breath sounds normal. No respiratory distress. She has no wheezes. She has no rales. She exhibits no tenderness.  Musculoskeletal: Normal range of motion.  Neurological: She is alert and oriented to person, place, and time.  Skin: Skin is warm, dry and intact. No rash noted. She is not diaphoretic. No erythema. No pallor.  Psychiatric: She  has a normal mood and affect. Her speech is normal and behavior is normal. Judgment and thought content normal. Cognition and memory are normal.  Nursing note and vitals reviewed.   Results for orders placed or performed in visit on 12/05/16  Lipid Panel w/o Chol/HDL Ratio  Result Value Ref Range   Cholesterol, Total 143 100 - 199 mg/dL   Triglycerides 208 (H) 0 - 149 mg/dL   HDL 30 (L) >39 mg/dL   VLDL Cholesterol Cal 42 (H) 5 - 40 mg/dL   LDL Calculated 71 0 - 99 mg/dL  Comprehensive metabolic panel  Result Value Ref Range   Glucose 76 65 - 99 mg/dL   BUN 15 6 - 20 mg/dL   Creatinine, Ser 0.75 0.57 - 1.00 mg/dL   GFR calc non Af Amer 101 >59 mL/min/1.73   GFR calc Af Amer 116 >59 mL/min/1.73   BUN/Creatinine Ratio 20 9 - 23   Sodium 138 134 - 144 mmol/L   Potassium 4.8 3.5 - 5.2 mmol/L   Chloride 97 96 - 106 mmol/L   CO2 20 18 - 29 mmol/L   Calcium 9.4 8.7 - 10.2 mg/dL   Total Protein 6.8 6.0 - 8.5 g/dL   Albumin 4.8 3.5 - 5.5 g/dL   Globulin, Total 2.0 1.5 - 4.5 g/dL   Albumin/Globulin Ratio 2.4 (H) 1.2 - 2.2   Bilirubin Total 0.3 0.0 - 1.2 mg/dL   Alkaline Phosphatase 70 39 - 117 IU/L   AST 14 0 - 40 IU/L   ALT 17 0 - 32 IU/L  Hgb A1c w/o eAG  Result Value Ref Range   Hgb A1c MFr Bld 6.1 (H) 4.8 - 5.6 %      Assessment & Plan:   Problem List Items Addressed This Visit      Endocrine   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 Continue diet and exercise and current regimen. If this good in 3 months, will reduce medication.       Relevant Medications   metFORMIN (GLUCOPHAGE) 500 MG tablet   lisinopril (PRINIVIL,ZESTRIL) 5 MG tablet   empagliflozin (JARDIANCE) 10 MG TABS tablet     Genitourinary   Chronic kidney disease    Checking labs today, await results.       Relevant Orders   CBC with Differential/Platelet   Comprehensive metabolic panel   Microalbumin, Urine Waived   Benign  hypertensive renal disease    Under good control. Continue current regimen. Continue to monitor. Call with any concerns.       Relevant Orders   CBC with Differential/Platelet   Comprehensive metabolic panel   Microalbumin, Urine Waived     Other   History of acute JRA    + family history of RA- will check labs to make sure it's not RA. Await results. Will start naproxen. Call with any concerns.       Relevant Orders   RA Qn+CCP(IgG/A)+SjoSSA+SjoSSB   Antinuclear Antib (ANA)   Sed Rate (ESR)  CK Total (and CKMB)       Follow up plan: Return in about 3 months (around 08/05/2017) for DM follow up.

## 2017-05-06 LAB — CBC WITH DIFFERENTIAL/PLATELET
Basophils Absolute: 0 x10E3/uL (ref 0.0–0.2)
Basos: 0 %
EOS (ABSOLUTE): 0.1 x10E3/uL (ref 0.0–0.4)
Eos: 2 %
Hematocrit: 34.7 % (ref 34.0–46.6)
Hemoglobin: 10.3 g/dL — ABNORMAL LOW (ref 11.1–15.9)
Immature Grans (Abs): 0 x10E3/uL (ref 0.0–0.1)
Immature Granulocytes: 0 %
Lymphocytes Absolute: 3.5 x10E3/uL — ABNORMAL HIGH (ref 0.7–3.1)
Lymphs: 44 %
MCH: 18.4 pg — ABNORMAL LOW (ref 26.6–33.0)
MCHC: 29.7 g/dL — ABNORMAL LOW (ref 31.5–35.7)
MCV: 62 fL — ABNORMAL LOW (ref 79–97)
Monocytes Absolute: 0.7 x10E3/uL (ref 0.1–0.9)
Monocytes: 9 %
Neutrophils Absolute: 3.6 x10E3/uL (ref 1.4–7.0)
Neutrophils: 45 %
Platelets: 294 x10E3/uL (ref 150–379)
RBC: 5.59 x10E6/uL — ABNORMAL HIGH (ref 3.77–5.28)
RDW: 19.9 % — ABNORMAL HIGH (ref 12.3–15.4)
WBC: 7.9 x10E3/uL (ref 3.4–10.8)

## 2017-05-06 LAB — COMPREHENSIVE METABOLIC PANEL WITH GFR
ALT: 15 IU/L (ref 0–32)
AST: 20 IU/L (ref 0–40)
Albumin/Globulin Ratio: 2 (ref 1.2–2.2)
Albumin: 4.5 g/dL (ref 3.5–5.5)
Alkaline Phosphatase: 63 IU/L (ref 39–117)
BUN/Creatinine Ratio: 15 (ref 9–23)
BUN: 13 mg/dL (ref 6–20)
Bilirubin Total: 0.3 mg/dL (ref 0.0–1.2)
CO2: 21 mmol/L (ref 20–29)
Calcium: 9 mg/dL (ref 8.7–10.2)
Chloride: 103 mmol/L (ref 96–106)
Creatinine, Ser: 0.88 mg/dL (ref 0.57–1.00)
GFR calc Af Amer: 96 mL/min/1.73 (ref >59)
GFR calc non Af Amer: 83 mL/min/1.73 (ref >59)
Globulin, Total: 2.3 g/dL (ref 1.5–4.5)
Glucose: 80 mg/dL (ref 65–99)
Potassium: 4.2 mmol/L (ref 3.5–5.2)
Sodium: 137 mmol/L (ref 134–144)
Total Protein: 6.8 g/dL (ref 6.0–8.5)

## 2017-05-06 LAB — LIPID PANEL W/O CHOL/HDL RATIO
Cholesterol, Total: 126 mg/dL (ref 100–199)
HDL: 33 mg/dL — ABNORMAL LOW (ref >39)
LDL Calculated: 71 mg/dL (ref 0–99)
Triglycerides: 110 mg/dL (ref 0–149)
VLDL Cholesterol Cal: 22 mg/dL (ref 5–40)

## 2017-05-08 LAB — ANA: ANA: NEGATIVE

## 2017-05-08 LAB — CK TOTAL AND CKMB (NOT AT ARMC)
CK-MB Index: 6.8 ng/mL (ref 0.0–5.3)
Total CK: 215 U/L — ABNORMAL HIGH (ref 24–173)

## 2017-05-08 LAB — RA QN+CCP(IGG/A)+SJOSSA+SJOSSB
CYCLIC CITRULLIN PEPTIDE AB: 4 U (ref 0–19)
ENA SSA (RO) AB: 0.2 AI (ref 0.0–0.9)
ENA SSB (LA) Ab: <0.2 AI (ref 0.0–0.9)
Rhuematoid fact SerPl-aCnc: <10 IU/mL (ref 0.0–13.9)

## 2017-05-08 LAB — SEDIMENTATION RATE: SED RATE: 18 mm/h (ref 0–32)

## 2017-05-11 LAB — MICROALBUMIN, URINE WAIVED
Creatinine, Urine Waived: 200 mg/dL (ref 10–300)
MICROALB, UR WAIVED: 30 mg/L — AB (ref 0–19)
Microalb/Creat Ratio: <30 mg/g (ref <30)

## 2017-05-11 LAB — BAYER DCA HB A1C WAIVED: HB A1C (BAYER DCA - WAIVED): 5.4 % (ref <7.0)

## 2017-05-19 ENCOUNTER — Telehealth: Payer: Self-pay | Admitting: Family Medicine

## 2017-05-19 NOTE — Telephone Encounter (Signed)
Patient notified

## 2017-05-19 NOTE — Telephone Encounter (Signed)
Spoke with patient, she states that it started yesterday, she feels like she is running a fever, but has not checked it.  She has a runny nose, upset stomach headache and wheezing.

## 2017-05-19 NOTE — Telephone Encounter (Signed)
It is likely a virus. If she is not feeling better after 7-10 days, starts running a high fever, coughing more or having pain, we should see her or she should let us know, but for 24 hours, and antibiotic would not be appropriate. Thanks!

## 2017-08-10 ENCOUNTER — Ambulatory Visit (INDEPENDENT_AMBULATORY_CARE_PROVIDER_SITE_OTHER): Payer: BC Managed Care – PPO | Admitting: Family Medicine

## 2017-08-10 ENCOUNTER — Encounter: Payer: Self-pay | Admitting: Family Medicine

## 2017-08-10 VITALS — BP 123/74 | HR 81 | Wt 229.6 lb

## 2017-08-10 DIAGNOSIS — N181 Chronic kidney disease, stage 1: Secondary | ICD-10-CM

## 2017-08-10 DIAGNOSIS — E1122 Type 2 diabetes mellitus with diabetic chronic kidney disease: Secondary | ICD-10-CM | POA: Diagnosis not present

## 2017-08-10 NOTE — Progress Notes (Signed)
BP 123/74 (BP Location: Left Arm, Patient Position: Sitting, Cuff Size: Normal)   Pulse 81   Wt 229 lb 9.6 oz (104.1 kg)   SpO2 99%   BMI 32.94 kg/m    Subjective:    Patient ID: [name], female    DOB: 1976-01-11, 41 y.o.   MRN: [id number]  HPI: [name] is a 41 y.o. female  Chief Complaint  Patient presents with  . Diabetes   DIABETES Hypoglycemic episodes:no Polydipsia/polyuria: no Visual disturbance: no Chest pain: no Paresthesias: no Glucose Monitoring: yes Taking Insulin?: no Blood Pressure Monitoring: not checking Retinal Examination: Up to Date Foot Exam: Up to Date Diabetic Education: Completed Pneumovax: Up to Date Influenza: Not up to Date Aspirin: yes  Relevant past medical, surgical, family and social history reviewed and updated as indicated. Interim medical history since our last visit reviewed. Allergies and medications reviewed and updated.  Review of Systems  Constitutional: Negative.   Respiratory: Negative.   Cardiovascular: Negative.   Psychiatric/Behavioral: Negative.     Per HPI unless specifically indicated above     Objective:    BP 123/74 (BP Location: Left Arm, Patient Position: Sitting, Cuff Size: Normal)   Pulse 81   Wt 229 lb 9.6 oz (104.1 kg)   SpO2 99%   BMI 32.94 kg/m   Wt Readings from Last 3 Encounters:  08/10/17 229 lb 9.6 oz (104.1 kg)  05/05/17 235 lb 2 oz (106.7 kg)  12/05/16 248 lb 9.6 oz (112.8 kg)    Physical Exam  Constitutional: She is oriented to person, place, and time. She appears well-developed and well-nourished. No distress.  HENT:  Head: Normocephalic and atraumatic.  Right Ear: Hearing normal.  Left Ear: Hearing normal.  Nose: Nose normal.  Eyes: Conjunctivae and lids are normal. Right eye exhibits no discharge. Left eye exhibits no discharge. No scleral icterus.  Cardiovascular: Normal rate, regular rhythm, normal heart sounds and intact distal pulses.  Exam reveals no  gallop and no friction rub.   No murmur heard. Pulmonary/Chest: Effort normal and breath sounds normal. No respiratory distress. She has no wheezes. She has no rales. She exhibits no tenderness.  Musculoskeletal: Normal range of motion.  Neurological: She is alert and oriented to person, place, and time.  Skin: Skin is warm, dry and intact. No rash noted. She is not diaphoretic. No erythema. No pallor.  Psychiatric: She has a normal mood and affect. Her speech is normal and behavior is normal. Judgment and thought content normal. Cognition and memory are normal.  Nursing note and vitals reviewed.   Results for orders placed or performed in visit on 05/05/17  Bayer DCA Hb A1c Waived  Result Value Ref Range   Bayer DCA Hb A1c Waived 5.4 <7.0 %  CBC with Differential/Platelet  Result Value Ref Range   WBC 7.9 3.4 - 10.8 x10E3/uL   RBC 5.59 (H) 3.77 - 5.28 x10E6/uL   Hemoglobin 10.3 (L) 11.1 - 15.9 g/dL   Hematocrit 34.7 34.0 - 46.6 %   MCV 62 (L) 79 - 97 fL   MCH 18.4 (L) 26.6 - 33.0 pg   MCHC 29.7 (L) 31.5 - 35.7 g/dL   RDW 19.9 (H) 12.3 - 15.4 %   Platelets 294 150 - 379 x10E3/uL   Neutrophils 45 Not Estab. %   Lymphs 44 Not Estab. %   Monocytes 9 Not Estab. %   Eos 2 Not Estab. %   Basos 0 Not Estab. %  Neutrophils Absolute 3.6 1.4 - 7.0 x10E3/uL   Lymphocytes Absolute 3.5 (H) 0.7 - 3.1 x10E3/uL   Monocytes Absolute 0.7 0.1 - 0.9 x10E3/uL   EOS (ABSOLUTE) 0.1 0.0 - 0.4 x10E3/uL   Basophils Absolute 0.0 0.0 - 0.2 x10E3/uL   Immature Granulocytes 0 Not Estab. %   Immature Grans (Abs) 0.0 0.0 - 0.1 x10E3/uL  Comprehensive metabolic panel  Result Value Ref Range   Glucose 80 65 - 99 mg/dL   BUN 13 6 - 20 mg/dL   Creatinine, Ser 0.88 0.57 - 1.00 mg/dL   GFR calc non Af Amer 83 >59 mL/min/1.73   GFR calc Af Amer 96 >59 mL/min/1.73   BUN/Creatinine Ratio 15 9 - 23   Sodium 137 134 - 144 mmol/L   Potassium 4.2 3.5 - 5.2 mmol/L   Chloride 103 96 - 106 mmol/L   CO2 21 20 - 29  mmol/L   Calcium 9.0 8.7 - 10.2 mg/dL   Total Protein 6.8 6.0 - 8.5 g/dL   Albumin 4.5 3.5 - 5.5 g/dL   Globulin, Total 2.3 1.5 - 4.5 g/dL   Albumin/Globulin Ratio 2.0 1.2 - 2.2   Bilirubin Total 0.3 0.0 - 1.2 mg/dL   Alkaline Phosphatase 63 39 - 117 IU/L   AST 20 0 - 40 IU/L   ALT 15 0 - 32 IU/L  Lipid Panel w/o Chol/HDL Ratio  Result Value Ref Range   Cholesterol, Total 126 100 - 199 mg/dL   Triglycerides 110 0 - 149 mg/dL   HDL 33 (L) >39 mg/dL   VLDL Cholesterol Cal 22 5 - 40 mg/dL   LDL Calculated 71 0 - 99 mg/dL  Microalbumin, Urine Waived  Result Value Ref Range   Microalb, Ur Waived 30 (H) 0 - 19 mg/L   Creatinine, Urine Waived 200 10 - 300 mg/dL   Microalb/Creat Ratio <30 <30 mg/g  RA Qn+CCP(IgG/A)+SjoSSA+SjoSSB  Result Value Ref Range   Rhuematoid fact SerPl-aCnc <10.0 0.0 - 13.9 IU/mL   ENA SSA (RO) Ab 0.2 0.0 - 0.9 AI   ENA SSB (LA) Ab <0.2 0.0 - 0.9 AI   Cyclic Citrullin Peptide Ab 4 0 - 19 units  Antinuclear Antib (ANA)  Result Value Ref Range   Anit Nuclear Antibody(ANA) Negative Negative  Sed Rate (ESR)  Result Value Ref Range   Sed Rate 18 0 - 32 mm/hr  CK Total (and CKMB)  Result Value Ref Range   Total CK 215 (H) 24 - 173 U/L   CK-MB Index 6.8 (>) 0.0 - 5.3 ng/mL      Assessment & Plan:   Problem List Items Addressed This Visit      Endocrine   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 Continue current regimen during the holidays. Call with any concerns. Recheck 3 months for DM.      Relevant Orders   Bayer DCA Hb A1c Waived       Follow up plan: Return in about 3 months (around 11/10/2017) for DM/cholesterol/BP follow up.

## 2017-08-10 NOTE — Assessment & Plan Note (Signed)
Under good control with A1c of 5.6. Continue current regimen during the holidays. Call with any concerns. Recheck 3 months for DM.

## 2017-08-11 LAB — BAYER DCA HB A1C WAIVED: HB A1C: 5.6 % (ref <7.0)

## 2017-08-16 ENCOUNTER — Other Ambulatory Visit: Payer: Self-pay | Admitting: Family Medicine

## 2017-09-12 ENCOUNTER — Other Ambulatory Visit: Payer: Self-pay | Admitting: Family Medicine

## 2017-09-29 ENCOUNTER — Telehealth: Payer: Self-pay | Admitting: Family Medicine

## 2017-09-29 ENCOUNTER — Ambulatory Visit: Payer: Self-pay

## 2017-09-29 MED ORDER — TRIAMCINOLONE ACETONIDE 0.1 % EX CREA: 1.0000 "application " | TOPICAL_CREAM | Freq: Two times a day (BID) | CUTANEOUS | 0 refills | Status: DC

## 2017-09-29 NOTE — Telephone Encounter (Signed)
Refilling triamcinolone cream

## 2017-09-29 NOTE — Telephone Encounter (Signed)
Ear pain triaged

## 2017-09-29 NOTE — Telephone Encounter (Signed)
Pt called with sudden onset of left ear pain. She states that the pain starts at the middle of her ear and radiates down to the jaw line to the center of her mouth.  Pt states the pain was so bad that it woke her up and approx. 0600 took a pill of tramadol from a friend. Now states pain is a 2/10. Called PCP and spoke with Christan who recommended pt to go to Urgent Care to be seen. Pt states that she will do that.   Reason for Disposition . Earache  (Exceptions: brief ear pain of < 60 minutes duration, earache occurring during air travel  Answer Assessment - Initial Assessment Questions 1. LOCATION: "Which ear is involved?"     left 2. ONSET: "When did the ear start hurting"      0400 this am  3. SEVERITY: "How bad is the pain?"  (Scale 1-10; mild, moderate or severe)   - MILD (1-3): doesn't interfere with normal activities    - MODERATE (4-7): interferes with normal activities or awakens from sleep    - SEVERE (8-10): excruciating pain, unable to do any normal activities      3/10 just took a friends tramadol about 6 hours ago. The pain is radiating down to jaw line to the center of mouth. 4. URI SYMPTOMS: " Do you have a runny nose or cough?"     Yes pt says a runny nose and cough 5. FEVER: "Do you have a fever?" If so, ask: "What is your temperature, how was it measured, and when did it start?"     no 6. CAUSE: "Have you been swimming recently?", "How often do you use Q-TIPS?", "Have you had any recent air travel or scuba diving?"     Pt uses Q tips 7. OTHER SYMPTOMS: "Do you have any other symptoms?" (e.g., headache, stiff neck, dizziness, vomiting, runny nose, decreased hearing)     No headache, stiff neck, no dizziness, vomiting, + head congestion. No decreased hearing 8. PREGNANCY: "Is there any chance you are pregnant?" "When was your last menstrual period?"     No LMP: November  Protocols used: [facility]

## 2017-09-29 NOTE — Telephone Encounter (Signed)
Copied from [place]. Topic: Quick Communication - See Telephone Encounter >> Sep 29, 2017 11:37 AM Synthia Innocent wrote: CRM for notification. See Telephone encounter for:  Seen last year and was prescribed triamcinolone cream (KENALOG) 0.1 % [[id number]], her hands are very dry again. Can this be called in? [facility] in [place].   Woke up this morning with left ear pain, went down to neck. No head pain. Thinks maybe her teeth. Should she make appt and be seen with Dr Wynetta Emery or call her dentist? 09/29/17.

## 2017-10-01 ENCOUNTER — Encounter: Payer: Self-pay | Admitting: Emergency Medicine

## 2017-10-01 ENCOUNTER — Emergency Department: Payer: BC Managed Care – PPO

## 2017-10-01 ENCOUNTER — Emergency Department
Admission: EM | Admit: 2017-10-01 | Discharge: 2017-10-01 | Disposition: A | Discharge status: Home / Self Care | Payer: BC Managed Care – PPO | Attending: Emergency Medicine | Admitting: Emergency Medicine

## 2017-10-01 DIAGNOSIS — Y929 Unspecified place or not applicable: Secondary | ICD-10-CM | POA: Diagnosis not present

## 2017-10-01 DIAGNOSIS — F17210 Nicotine dependence, cigarettes, uncomplicated: Secondary | ICD-10-CM | POA: Insufficient documentation

## 2017-10-01 DIAGNOSIS — M0809 Unspecified juvenile rheumatoid arthritis, multiple sites: Secondary | ICD-10-CM | POA: Diagnosis not present

## 2017-10-01 DIAGNOSIS — Z7984 Long term (current) use of oral hypoglycemic drugs: Secondary | ICD-10-CM | POA: Diagnosis not present

## 2017-10-01 DIAGNOSIS — W11XXXA Fall on and from ladder, initial encounter: Secondary | ICD-10-CM | POA: Diagnosis not present

## 2017-10-01 DIAGNOSIS — Y999 Unspecified external cause status: Secondary | ICD-10-CM | POA: Diagnosis not present

## 2017-10-01 DIAGNOSIS — S82302A Unspecified fracture of lower end of left tibia, initial encounter for closed fracture: Secondary | ICD-10-CM | POA: Insufficient documentation

## 2017-10-01 DIAGNOSIS — N189 Chronic kidney disease, unspecified: Secondary | ICD-10-CM | POA: Insufficient documentation

## 2017-10-01 DIAGNOSIS — S8992XA Unspecified injury of left lower leg, initial encounter: Secondary | ICD-10-CM | POA: Diagnosis present

## 2017-10-01 DIAGNOSIS — E1122 Type 2 diabetes mellitus with diabetic chronic kidney disease: Secondary | ICD-10-CM | POA: Diagnosis not present

## 2017-10-01 DIAGNOSIS — I129 Hypertensive chronic kidney disease with stage 1 through stage 4 chronic kidney disease, or unspecified chronic kidney disease: Secondary | ICD-10-CM | POA: Insufficient documentation

## 2017-10-01 DIAGNOSIS — Y9389 Activity, other specified: Secondary | ICD-10-CM | POA: Diagnosis not present

## 2017-10-01 DIAGNOSIS — I252 Old myocardial infarction: Secondary | ICD-10-CM | POA: Insufficient documentation

## 2017-10-01 DIAGNOSIS — Z79899 Other long term (current) drug therapy: Secondary | ICD-10-CM | POA: Insufficient documentation

## 2017-10-01 MED ORDER — IBUPROFEN 800 MG PO TABS: 800.0000 mg | ORAL_TABLET | Freq: Three times a day (TID) | ORAL | 0 refills | Status: DC | PRN

## 2017-10-01 NOTE — ED Notes (Signed)
See triage note  States she fell yesterday from ladder  States ladder was on counter and she was up about 6 th rung up  Landed on her feet  Having pain to left knee and ankle area  No swelling or deformity noted  good pulses

## 2017-10-01 NOTE — ED Triage Notes (Addendum)
Pt states she fell off a ladder yesterday and landed on her feet.  Pt states she had a ladder on a counter top and tried to get on it but wasn't able to get on it correctly and fell. Pt states her left knee is also hurting.  Pt states she only fell onto her feet she did not injure any other part of her body.  Pt ambulatory in triage.  Pt states she took pain medication this morning that is prescribed.
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
## 2017-10-02 ENCOUNTER — Ambulatory Visit (INDEPENDENT_AMBULATORY_CARE_PROVIDER_SITE_OTHER): Payer: BC Managed Care – PPO | Admitting: Orthopaedic Surgery

## 2017-10-02 ENCOUNTER — Encounter (INDEPENDENT_AMBULATORY_CARE_PROVIDER_SITE_OTHER): Payer: Self-pay | Admitting: Orthopaedic Surgery

## 2017-10-02 DIAGNOSIS — M25572 Pain in left ankle and joints of left foot: Secondary | ICD-10-CM
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
## 2017-10-06 ENCOUNTER — Other Ambulatory Visit: Payer: Self-pay | Admitting: Family Medicine

## 2017-11-10 ENCOUNTER — Ambulatory Visit: Payer: BC Managed Care – PPO | Admitting: Family Medicine

## 2017-11-10 ENCOUNTER — Encounter: Payer: Self-pay | Admitting: Family Medicine

## 2017-11-10 VITALS — BP 138/76 | HR 89 | Temp 97.9°F | Wt 244.0 lb

## 2017-11-10 DIAGNOSIS — I129 Hypertensive chronic kidney disease with stage 1 through stage 4 chronic kidney disease, or unspecified chronic kidney disease: Secondary | ICD-10-CM

## 2017-11-10 DIAGNOSIS — J209 Acute bronchitis, unspecified: Secondary | ICD-10-CM

## 2017-11-10 DIAGNOSIS — Z720 Tobacco use: Secondary | ICD-10-CM | POA: Diagnosis not present

## 2017-11-10 DIAGNOSIS — E1122 Type 2 diabetes mellitus with diabetic chronic kidney disease: Secondary | ICD-10-CM

## 2017-11-10 DIAGNOSIS — N181 Chronic kidney disease, stage 1: Secondary | ICD-10-CM

## 2017-11-10 DIAGNOSIS — R05 Cough: Secondary | ICD-10-CM | POA: Diagnosis not present

## 2017-11-10 DIAGNOSIS — R059 Cough, unspecified: Secondary | ICD-10-CM

## 2017-11-10 LAB — VERITOR FLU A/B WAIVED
INFLUENZA A: NEGATIVE
INFLUENZA B: NEGATIVE

## 2017-11-10 MED ORDER — BENZONATATE 200 MG PO CAPS: 200.0000 mg | ORAL_CAPSULE | Freq: Two times a day (BID) | ORAL | 0 refills | Status: DC | PRN

## 2017-11-10 MED ORDER — HYDROCOD POLST-CPM POLST ER 10-8 MG/5ML PO SUER: 5.0000 mL | Freq: Every evening | ORAL | 0 refills | Status: DC | PRN

## 2017-11-10 MED ORDER — NAPROXEN 500 MG PO TABS: ORAL_TABLET | ORAL | 1 refills | Status: DC

## 2017-11-10 MED ORDER — AZITHROMYCIN 250 MG PO TABS
ORAL_TABLET | ORAL | 0 refills | Status: DC
Start: 2017-11-10 — End: 2017-11-14

## 2017-11-10 MED ORDER — EMPAGLIFLOZIN 10 MG PO TABS: 10.0000 mg | ORAL_TABLET | Freq: Every day | ORAL | 1 refills | Status: DC

## 2017-11-10 MED ORDER — PANTOPRAZOLE SODIUM 40 MG PO TBEC: 40.0000 mg | DELAYED_RELEASE_TABLET | Freq: Two times a day (BID) | ORAL | 3 refills | Status: DC

## 2017-11-10 MED ORDER — ALBUTEROL SULFATE (2.5 MG/3ML) 0.083% IN NEBU
2.5000 mg | INHALATION_SOLUTION | Freq: Once | RESPIRATORY_TRACT | Status: AC
  Administered 2017-11-10: 2.5 mg via RESPIRATORY_TRACT

## 2017-11-10 MED ORDER — METFORMIN HCL 500 MG PO TABS: ORAL_TABLET | ORAL | 1 refills | Status: DC

## 2017-11-10 MED ORDER — LISINOPRIL 5 MG PO TABS: 5.0000 mg | ORAL_TABLET | Freq: Every day | ORAL | 1 refills | Status: DC

## 2017-11-10 MED ORDER — PREDNISONE 50 MG PO TABS: 50.0000 mg | ORAL_TABLET | Freq: Every day | ORAL | 0 refills | Status: DC

## 2017-11-10 NOTE — Assessment & Plan Note (Addendum)
Under good control. Continue current regimen. Continue to monitor. Call with any concerns. Refills given today. 

## 2017-11-10 NOTE — Assessment & Plan Note (Signed)
Checking labs. Encouraged quitting. Call when she would like help.

## 2017-11-10 NOTE — Assessment & Plan Note (Signed)
Under good control with A1c of 5.3. Continue current regimen. If this good next time, will stop jardiance.

## 2017-11-10 NOTE — Assessment & Plan Note (Signed)
Rechecking levels today. Continue lisinopril. Call with any concerns.

## 2017-11-10 NOTE — Progress Notes (Signed)
BP 138/76 (BP Location: Left Arm, Patient Position: Sitting)   Pulse 89   Temp 97.9 F (36.6 C) (Oral)   Wt 244 lb (110.7 kg)   BMI 33.09 kg/m    Subjective:    Patient ID: [name], female    DOB: Mar 17, 1976, 42 y.o.   MRN: [id number]  HPI: [name] is a 42 y.o. female  Chief Complaint  Patient presents with  . Follow-up  . Cough    since saturday  . Nasal Congestion    since saturday    UPPER RESPIRATORY TRACT INFECTION Duration: 7 days Worst symptom: cough Fever: yes Cough: yes Shortness of breath: yes Wheezing: yes Chest pain: yes, with cough Chest tightness: yes Chest congestion: yes Nasal congestion: yes Runny nose: yes Post nasal drip: yes Sneezing: no Sore throat: yes Swollen glands: no Sinus pressure: no Headache: no Face pain: no Toothache: no Ear pain: no  Ear pressure: no  Eyes red/itching:no Eye drainage/crusting: no  Vomiting: no Rash: no Fatigue: yes Sick contacts: yes Strep contacts: yes  Context: worse Recurrent sinusitis: no Relief with OTC cold/cough medications: no  Treatments attempted: cold/sinus, mucinex, anti-histamine and pseudoephedrine   DIABETES Hypoglycemic episodes:no Polydipsia/polyuria: no Visual disturbance: no Chest pain: no Paresthesias: no Glucose Monitoring: no Taking Insulin?: no Blood Pressure Monitoring: not checking Retinal Examination: Not up to Date Foot Exam: Up to Date Diabetic Education: Completed Pneumovax: Up to Date Influenza: Not Up to Date Aspirin: no  HYPERTENSION Hypertension status: controlled  Satisfied with current treatment? yes Duration of hypertension: chronic BP monitoring frequency:  not checking BP medication side effects:  no Medication compliance: excellent compliance Previous BP meds: lisinopril Aspirin: no Recurrent headaches: no Visual changes: no Palpitations: no Dyspnea: no Chest pain: no Lower extremity edema: no Dizzy/lightheaded:  no   Relevant past medical, surgical, family and social history reviewed and updated as indicated. Interim medical history since our last visit reviewed. Allergies and medications reviewed and updated.  Review of Systems  Constitutional: Positive for chills, fatigue and fever. Negative for activity change, appetite change, diaphoresis and unexpected weight change.  HENT: Positive for congestion, postnasal drip, rhinorrhea, sinus pressure, sneezing and sore throat. Negative for dental problem, drooling, ear discharge, ear pain, facial swelling, hearing loss, mouth sores, nosebleeds, sinus pain, tinnitus, trouble swallowing and voice change.   Eyes: Negative.   Respiratory: Positive for cough, chest tightness, shortness of breath and wheezing. Negative for apnea, choking and stridor.   Cardiovascular: Positive for chest pain. Negative for palpitations and leg swelling.  Neurological: Negative.   Psychiatric/Behavioral: Negative.     Per HPI unless specifically indicated above     Objective:    BP 138/76 (BP Location: Left Arm, Patient Position: Sitting)   Pulse 89   Temp 97.9 F (36.6 C) (Oral)   Wt 244 lb (110.7 kg)   BMI 33.09 kg/m   Wt Readings from Last 3 Encounters:  11/10/17 244 lb (110.7 kg)  10/01/17 235 lb (106.6 kg)  08/10/17 229 lb 9.6 oz (104.1 kg)    Physical Exam  Constitutional: She is oriented to person, place, and time. She appears well-developed and well-nourished. No distress.  HENT:  Head: Normocephalic and atraumatic.  Right Ear: Hearing and external ear normal.  Left Ear: Hearing and external ear normal.  Nose: Nose normal.  Mouth/Throat: Oropharynx is clear and moist. No oropharyngeal exudate.  Eyes: Conjunctivae, EOM and lids are normal. Pupils are equal, round, and reactive to light. Right eye  exhibits no discharge. Left eye exhibits no discharge. No scleral icterus.  Neck: Normal range of motion. Neck supple. No JVD present. No tracheal deviation  present. No thyromegaly present.  Cardiovascular: Normal rate, regular rhythm, normal heart sounds and intact distal pulses. Exam reveals no gallop and no friction rub.  No murmur heard. Pulmonary/Chest: Effort normal. No stridor. No respiratory distress. She has wheezes. She has no rales. She exhibits no tenderness.  Musculoskeletal: Normal range of motion.  Lymphadenopathy:    She has no cervical adenopathy.  Neurological: She is alert and oriented to person, place, and time.  Skin: Skin is warm, dry and intact. No rash noted. She is not diaphoretic. No erythema. No pallor.  Psychiatric: She has a normal mood and affect. Her speech is normal and behavior is normal. Judgment and thought content normal. Cognition and memory are normal.  Nursing note and vitals reviewed.   Results for orders placed or performed in visit on 11/10/17  Veritor Flu A/B Waived  Result Value Ref Range   Influenza A Negative Negative   Influenza B Negative Negative      Assessment & Plan:   Problem List Items Addressed This Visit      Endocrine   Controlled type 2 diabetes with renal manifestation ([place])    Under good control with A1c of 5.3. Continue current regimen. If this good next time, will stop jardiance.       Relevant Medications   metFORMIN (GLUCOPHAGE) 500 MG tablet   lisinopril (PRINIVIL,ZESTRIL) 5 MG tablet   empagliflozin (JARDIANCE) 10 MG TABS tablet   Other Relevant Orders   Bayer DCA Hb A1c Waived   Comprehensive metabolic panel   Lipid Panel [place], Waived   Microalbumin, Urine Waived     Genitourinary   Chronic kidney disease    Rechecking levels today. Continue lisinopril. Call with any concerns.      Relevant Orders   CBC with Differential/Platelet   Comprehensive metabolic panel   Benign hypertensive renal disease    Under good control. Continue current regimen. Continue to monitor. Call with any concerns. Refills given today.      Relevant Orders   Comprehensive  metabolic panel   Microalbumin, Urine Waived     Other   Tobacco use    Checking labs. Encouraged quitting. Call when she would like help.      Relevant Orders   Comprehensive metabolic panel    Other Visit Diagnoses    Acute bronchitis, unspecified organism    -  Primary   Will treat with prednisone, tussionex, tessalon perles and azithromycin. Recheck Monday or Tuesday.   Relevant Medications   albuterol (PROVENTIL) (2.5 MG/3ML) 0.083% nebulizer solution 2.5 mg (Completed) (Start on 11/10/2017  5:00 PM)   Cough       Flu negative.    Relevant Medications   albuterol (PROVENTIL) (2.5 MG/3ML) 0.083% nebulizer solution 2.5 mg (Completed) (Start on 11/10/2017  5:00 PM)   Other Relevant Orders   Veritor Flu A/B Waived (Completed)       Follow up plan: Return Monday or Tuesday, for follow up breathing.

## 2017-11-11 LAB — CBC WITH DIFFERENTIAL/PLATELET
Basophils Absolute: 0 10*3/uL (ref 0.0–0.2)
Basos: 1 %
EOS (ABSOLUTE): 0.1 10*3/uL (ref 0.0–0.4)
EOS: 2 %
HEMATOCRIT: 31.2 % — AB (ref 34.0–46.6)
Hemoglobin: 9.3 g/dL — ABNORMAL LOW (ref 11.1–15.9)
Immature Grans (Abs): 0 10*3/uL (ref 0.0–0.1)
Immature Granulocytes: 0 %
LYMPHS ABS: 1.8 10*3/uL (ref 0.7–3.1)
Lymphs: 29 %
MCH: 18.4 pg — ABNORMAL LOW (ref 26.6–33.0)
MCHC: 29.8 g/dL — ABNORMAL LOW (ref 31.5–35.7)
MCV: 62 fL — ABNORMAL LOW (ref 79–97)
MONOS ABS: 0.6 10*3/uL (ref 0.1–0.9)
Monocytes: 9 %
NEUTROS ABS: 3.7 10*3/uL (ref 1.4–7.0)
Neutrophils: 59 %
Platelets: 260 10*3/uL (ref 150–379)
RBC: 5.05 x10E6/uL (ref 3.77–5.28)
RDW: 17.6 % — AB (ref 12.3–15.4)
WBC: 6.3 10*3/uL (ref 3.4–10.8)

## 2017-11-11 LAB — COMPREHENSIVE METABOLIC PANEL
ALBUMIN: 4.4 g/dL (ref 3.5–5.5)
ALK PHOS: 69 IU/L (ref 39–117)
ALT: 18 IU/L (ref 0–32)
AST: 17 IU/L (ref 0–40)
Albumin/Globulin Ratio: 1.8 (ref 1.2–2.2)
BUN / CREAT RATIO: 15 (ref 9–23)
BUN: 13 mg/dL (ref 6–24)
Bilirubin Total: <0.2 mg/dL (ref 0.0–1.2)
CO2: 23 mmol/L (ref 20–29)
CREATININE: 0.88 mg/dL (ref 0.57–1.00)
Calcium: 9 mg/dL (ref 8.7–10.2)
Chloride: 105 mmol/L (ref 96–106)
GFR calc non Af Amer: 82 mL/min/{1.73_m2} (ref >59)
GFR, EST AFRICAN AMERICAN: 95 mL/min/{1.73_m2} (ref >59)
GLOBULIN, TOTAL: 2.4 g/dL (ref 1.5–4.5)
Glucose: 145 mg/dL — ABNORMAL HIGH (ref 65–99)
Potassium: 3.7 mmol/L (ref 3.5–5.2)
SODIUM: 143 mmol/L (ref 134–144)
Total Protein: 6.8 g/dL (ref 6.0–8.5)

## 2017-11-11 LAB — MICROALBUMIN, URINE WAIVED
CREATININE, URINE WAIVED: 50 mg/dL (ref 10–300)
MICROALB, UR WAIVED: 10 mg/L (ref 0–19)

## 2017-11-11 LAB — LIPID PANEL PICCOLO, WAIVED
CHOLESTEROL PICCOLO, WAIVED: 138 mg/dL (ref <200)
Chol/HDL Ratio Piccolo,Waive: 4.6 mg/dL
HDL Chol Piccolo, Waived: 30 mg/dL — ABNORMAL LOW (ref >59)
LDL CHOL CALC PICCOLO WAIVED: 68 mg/dL (ref <100)
Triglycerides Piccolo,Waived: 202 mg/dL — ABNORMAL HIGH (ref <150)
VLDL Chol Calc Piccolo,Waive: 40 mg/dL — ABNORMAL HIGH (ref <30)

## 2017-11-11 LAB — BAYER DCA HB A1C WAIVED: HB A1C (BAYER DCA - WAIVED): 5.3 % (ref <7.0)

## 2017-11-14 ENCOUNTER — Encounter: Payer: Self-pay | Admitting: Family Medicine

## 2017-11-14 ENCOUNTER — Ambulatory Visit: Payer: BC Managed Care – PPO | Admitting: Family Medicine

## 2017-11-14 VITALS — BP 129/80 | HR 73 | Temp 98.4°F | Wt 247.4 lb

## 2017-11-14 DIAGNOSIS — D509 Iron deficiency anemia, unspecified: Secondary | ICD-10-CM | POA: Diagnosis not present

## 2017-11-14 DIAGNOSIS — Z1231 Encounter for screening mammogram for malignant neoplasm of breast: Secondary | ICD-10-CM | POA: Diagnosis not present

## 2017-11-14 DIAGNOSIS — Z1239 Encounter for other screening for malignant neoplasm of breast: Secondary | ICD-10-CM

## 2017-11-14 DIAGNOSIS — J209 Acute bronchitis, unspecified: Secondary | ICD-10-CM

## 2017-11-14 MED ORDER — PREDNISONE 10 MG PO TABS: ORAL_TABLET | ORAL | 0 refills | Status: DC

## 2017-11-14 MED ORDER — FERROUS SULFATE 325 (65 FE) MG PO TABS: 325.0000 mg | ORAL_TABLET | Freq: Three times a day (TID) | ORAL | 6 refills | Status: DC

## 2017-11-14 NOTE — Patient Instructions (Addendum)
[facility]  Address: [address], [place], [place] [address]  Phone: [phone]  

## 2017-11-14 NOTE — Progress Notes (Signed)
BP 129/80 (BP Location: Left Arm, Patient Position: Sitting, Cuff Size: Large)   Pulse 73   Temp 98.4 F (36.9 C) (Oral)   Wt 247 lb 6 oz (112.2 kg)   SpO2 98%   BMI 33.55 kg/m    Subjective:    Patient ID: [name], female    DOB: January 13, 1976, 42 y.o.   MRN: [id number]  HPI: [name] is a 42 y.o. female  Chief Complaint  Patient presents with  . Asthma   Still not felling 100%, but starting to feel better.  ANEMIA Anemia status: uncontrolled Etiology of anemia: iron deficency Duration of anemia treatment: newly diagnosed Compliance with treatment: newly diagnosed Iron supplementation side effects: no Severity of anemia: moderate Fatigue: yes Decreased exercise tolerance: yes  Dyspnea on exertion: yes Palpitations: no Bleeding: yes Pica: yes   Relevant past medical, surgical, family and social history reviewed and updated as indicated. Interim medical history since our last visit reviewed. Allergies and medications reviewed and updated.  Review of Systems  Constitutional: Positive for fatigue. Negative for activity change, appetite change, chills, diaphoresis, fever and unexpected weight change.  HENT: Negative for congestion, dental problem, drooling, ear discharge, ear pain, facial swelling, hearing loss, mouth sores, nosebleeds, postnasal drip, rhinorrhea, sinus pressure, sinus pain, sneezing, sore throat, tinnitus, trouble swallowing and voice change.   Eyes: Negative.   Respiratory: Positive for cough, chest tightness, shortness of breath and wheezing. Negative for apnea, choking and stridor.   Cardiovascular: Negative.   Psychiatric/Behavioral: Negative.     Per HPI unless specifically indicated above     Objective:    BP 129/80 (BP Location: Left Arm, Patient Position: Sitting, Cuff Size: Large)   Pulse 73   Temp 98.4 F (36.9 C) (Oral)   Wt 247 lb 6 oz (112.2 kg)   SpO2 98%   BMI 33.55 kg/m   Wt Readings from Last 3  Encounters:  11/14/17 247 lb 6 oz (112.2 kg)  11/10/17 244 lb (110.7 kg)  10/01/17 235 lb (106.6 kg)    Physical Exam  Constitutional: She is oriented to person, place, and time. She appears well-developed and well-nourished. No distress.  HENT:  Head: Normocephalic and atraumatic.  Right Ear: Hearing normal.  Left Ear: Hearing normal.  Nose: Nose normal.  Eyes: Conjunctivae and lids are normal. Right eye exhibits no discharge. Left eye exhibits no discharge. No scleral icterus.  Cardiovascular: Normal rate, regular rhythm, normal heart sounds and intact distal pulses. Exam reveals no gallop and no friction rub.  No murmur heard. Pulmonary/Chest: Effort normal. No respiratory distress. She has wheezes. She has no rales. She exhibits no tenderness.  Musculoskeletal: Normal range of motion.  Neurological: She is alert and oriented to person, place, and time.  Skin: Skin is warm, dry and intact. No rash noted. She is not diaphoretic. No erythema. No pallor.  Psychiatric: She has a normal mood and affect. Her speech is normal and behavior is normal. Judgment and thought content normal. Cognition and memory are normal.    Results for orders placed or performed in visit on 11/10/17  Veritor Flu A/B Waived  Result Value Ref Range   Influenza A Negative Negative   Influenza B Negative Negative  CBC with Differential/Platelet  Result Value Ref Range   WBC 6.3 3.4 - 10.8 x10E3/uL   RBC 5.05 3.77 - 5.28 x10E6/uL   Hemoglobin 9.3 (L) 11.1 - 15.9 g/dL   Hematocrit 31.2 (L) 34.0 - 46.6 %  MCV 62 (L) 79 - 97 fL   MCH 18.4 (L) 26.6 - 33.0 pg   MCHC 29.8 (L) 31.5 - 35.7 g/dL   RDW 17.6 (H) 12.3 - 15.4 %   Platelets 260 150 - 379 x10E3/uL   Neutrophils 59 Not Estab. %   Lymphs 29 Not Estab. %   Monocytes 9 Not Estab. %   Eos 2 Not Estab. %   Basos 1 Not Estab. %   Neutrophils Absolute 3.7 1.4 - 7.0 x10E3/uL   Lymphocytes Absolute 1.8 0.7 - 3.1 x10E3/uL   Monocytes Absolute 0.6 0.1 - 0.9  x10E3/uL   EOS (ABSOLUTE) 0.1 0.0 - 0.4 x10E3/uL   Basophils Absolute 0.0 0.0 - 0.2 x10E3/uL   Immature Granulocytes 0 Not Estab. %   Immature Grans (Abs) 0.0 0.0 - 0.1 x10E3/uL  Bayer DCA Hb A1c Waived  Result Value Ref Range   Bayer DCA Hb A1c Waived 5.3 <7.0 %  Comprehensive metabolic panel  Result Value Ref Range   Glucose 145 (H) 65 - 99 mg/dL   BUN 13 6 - 24 mg/dL   Creatinine, Ser 0.88 0.57 - 1.00 mg/dL   GFR calc non Af Amer 82 >59 mL/min/1.73   GFR calc Af Amer 95 >59 mL/min/1.73   BUN/Creatinine Ratio 15 9 - 23   Sodium 143 134 - 144 mmol/L   Potassium 3.7 3.5 - 5.2 mmol/L   Chloride 105 96 - 106 mmol/L   CO2 23 20 - 29 mmol/L   Calcium 9.0 8.7 - 10.2 mg/dL   Total Protein 6.8 6.0 - 8.5 g/dL   Albumin 4.4 3.5 - 5.5 g/dL   Globulin, Total 2.4 1.5 - 4.5 g/dL   Albumin/Globulin Ratio 1.8 1.2 - 2.2   Bilirubin Total <0.2 0.0 - 1.2 mg/dL   Alkaline Phosphatase 69 39 - 117 IU/L   AST 17 0 - 40 IU/L   ALT 18 0 - 32 IU/L  Lipid Panel Piccolo, Waived  Result Value Ref Range   Cholesterol Piccolo, Waived 138 <200 mg/dL   HDL Chol Piccolo, Waived 30 (L) >59 mg/dL   Triglycerides Piccolo,Waived 202 (H) <150 mg/dL   Chol/HDL Ratio Piccolo,Waive 4.6 mg/dL   LDL Chol Calc Piccolo Waived 68 <100 mg/dL   VLDL Chol Calc Piccolo,Waive 40 (H) <30 mg/dL  Microalbumin, Urine Waived  Result Value Ref Range   Microalb, Ur Waived 10 0 - 19 mg/L   Creatinine, Urine Waived 50 10 - 300 mg/dL   Microalb/Creat Ratio <30 <30 mg/g      Assessment & Plan:   Problem List Items Addressed This Visit    None    Visit Diagnoses    Acute bronchitis, unspecified organism    -  Primary   Better, but not there. Will treat with 12 day taper of prednisone and recheck 2 weeks. Call with any concerns.    Screening for breast cancer       Mammogram ordered today.   Relevant Orders   MM Digital Screening   Iron deficiency anemia, unspecified iron deficiency anemia type       Will start iron and  recheck 1 month. Call with any concerns.    Relevant Medications   ferrous sulfate (FERROUSUL) 325 (65 FE) MG tablet       Follow up plan: Return 2 weeks, for lung recheck.

## 2017-11-17 ENCOUNTER — Other Ambulatory Visit: Payer: Self-pay | Admitting: Family Medicine

## 2017-11-17 ENCOUNTER — Encounter: Payer: Self-pay | Admitting: Family Medicine

## 2017-11-17 DIAGNOSIS — L853 Xerosis cutis: Secondary | ICD-10-CM

## 2017-11-17 NOTE — Progress Notes (Signed)
Hands worse. Would like to see dermatology. Referral generated. Feeling better. OK to go back to work.

## 2017-11-30 ENCOUNTER — Ambulatory Visit: Payer: BC Managed Care – PPO | Admitting: Family Medicine

## 2017-11-30 ENCOUNTER — Encounter: Payer: Self-pay | Admitting: Family Medicine

## 2017-11-30 VITALS — BP 124/82 | HR 84 | Wt 246.1 lb

## 2017-11-30 DIAGNOSIS — K625 Hemorrhage of anus and rectum: Secondary | ICD-10-CM

## 2017-11-30 DIAGNOSIS — R197 Diarrhea, unspecified: Secondary | ICD-10-CM

## 2017-11-30 DIAGNOSIS — D500 Iron deficiency anemia secondary to blood loss (chronic): Secondary | ICD-10-CM | POA: Insufficient documentation

## 2017-11-30 DIAGNOSIS — J209 Acute bronchitis, unspecified: Secondary | ICD-10-CM | POA: Diagnosis not present

## 2017-11-30 DIAGNOSIS — D509 Iron deficiency anemia, unspecified: Secondary | ICD-10-CM | POA: Diagnosis not present

## 2017-11-30 MED ORDER — METFORMIN HCL ER (MOD) 500 MG PO TB24: 500.0000 mg | ORAL_TABLET | Freq: Every day | ORAL | 1 refills | Status: DC

## 2017-11-30 MED ORDER — HYDROCORTISONE ACETATE 25 MG RE SUPP: 25.0000 mg | Freq: Two times a day (BID) | RECTAL | 12 refills | Status: DC

## 2017-11-30 NOTE — Assessment & Plan Note (Signed)
Likely due to rectal bleeding. Will recheck levels and await results. Continue iron. Call with any concerns.

## 2017-11-30 NOTE — Progress Notes (Signed)
BP 124/82 (BP Location: Left Arm, Patient Position: Sitting, Cuff Size: Large)   Pulse 84   Wt 246 lb 1 oz (111.6 kg)   SpO2 97%   BMI 33.37 kg/m    Subjective:    Patient ID: [name], female    DOB: [birth year], 42 y.o.   MRN: [id number]  HPI: [name] is a 42 y.o. female  Chief Complaint  Patient presents with  . Anemia  . lung recheck   Breathing is better. Still coughing a little bit, but feeling better.   ANEMIA Anemia status: uncontrolled Etiology of anemia: iron deficiency Duration of anemia treatment: 2 weeks Compliance with treatment: excellent compliance Iron supplementation side effects: no Severity of anemia: moderate Fatigue: yes Decreased exercise tolerance: yes  Dyspnea on exertion: yes Palpitations: yes Bleeding: yes- having some rectal bleeding Pica: yes  RECTAL BLEEDING Duration: All her life, no changes- has never mentioned it Bright red rectal bleeding: yes  Amount of blood: varies from small amount to a lot of blood  Frequency: every day. Almost every bowel movement. Not without BM- sometimes worse than others Melena: no  Spotting on toilet tissue: no  Anal fullness: yes  Perianal pain: yes  Severity: moderate Perianal irritation/itching: yes  Constipation: no  Chronic straining/valsava:  no  Anal trauma/intercourse: no  Hemorrhoids: yes  Previous colonoscopy: no     Relevant past medical, surgical, family and social history reviewed and updated as indicated. Interim medical history since our last visit reviewed. Allergies and medications reviewed and updated.  Review of Systems  Constitutional: Positive for fatigue. Negative for activity change, appetite change, chills, diaphoresis, fever and unexpected weight change.  Respiratory: Negative.   Cardiovascular: Negative.   Gastrointestinal: Positive for abdominal pain, anal bleeding, blood in stool and diarrhea. Negative for abdominal distention, constipation,  nausea, rectal pain and vomiting.  Genitourinary: Negative.   Musculoskeletal: Negative.   Skin: Negative.   Neurological: Negative.   Psychiatric/Behavioral: Negative.     Per HPI unless specifically indicated above     Objective:    BP 124/82 (BP Location: Left Arm, Patient Position: Sitting, Cuff Size: Large)   Pulse 84   Wt 246 lb 1 oz (111.6 kg)   SpO2 97%   BMI 33.37 kg/m   Wt Readings from Last 3 Encounters:  11/30/17 246 lb 1 oz (111.6 kg)  11/14/17 247 lb 6 oz (112.2 kg)  11/10/17 244 lb (110.7 kg)    Physical Exam  Constitutional: She is oriented to person, place, and time. She appears well-developed and well-nourished. No distress.  HENT:  Head: Normocephalic and atraumatic.  Right Ear: Hearing normal.  Left Ear: Hearing normal.  Nose: Nose normal.  Eyes: Conjunctivae and lids are normal. Right eye exhibits no discharge. Left eye exhibits no discharge. No scleral icterus.  Cardiovascular: Normal rate, regular rhythm, normal heart sounds and intact distal pulses. Exam reveals no gallop and no friction rub.  No murmur heard. Pulmonary/Chest: Effort normal and breath sounds normal. No respiratory distress. She has no wheezes. She has no rales. She exhibits no tenderness.  Musculoskeletal: Normal range of motion.  Neurological: She is alert and oriented to person, place, and time.  Skin: Skin is warm, dry and intact. No rash noted. She is not diaphoretic. No erythema. No pallor.  Psychiatric: She has a normal mood and affect. Her speech is normal and behavior is normal. Judgment and thought content normal. Cognition and memory are normal.  Nursing note and vitals  reviewed.   Results for orders placed or performed in visit on 11/10/17  Veritor Flu A/B Waived  Result Value Ref Range   Influenza A Negative Negative   Influenza B Negative Negative  CBC with Differential/Platelet  Result Value Ref Range   WBC 6.3 3.4 - 10.8 x10E3/uL   RBC 5.05 3.77 - 5.28 x10E6/uL     Hemoglobin 9.3 (L) 11.1 - 15.9 g/dL   Hematocrit 31.2 (L) 34.0 - 46.6 %   MCV 62 (L) 79 - 97 fL   MCH 18.4 (L) 26.6 - 33.0 pg   MCHC 29.8 (L) 31.5 - 35.7 g/dL   RDW 17.6 (H) 12.3 - 15.4 %   Platelets 260 150 - 379 x10E3/uL   Neutrophils 59 Not Estab. %   Lymphs 29 Not Estab. %   Monocytes 9 Not Estab. %   Eos 2 Not Estab. %   Basos 1 Not Estab. %   Neutrophils Absolute 3.7 1.4 - 7.0 x10E3/uL   Lymphocytes Absolute 1.8 0.7 - 3.1 x10E3/uL   Monocytes Absolute 0.6 0.1 - 0.9 x10E3/uL   EOS (ABSOLUTE) 0.1 0.0 - 0.4 x10E3/uL   Basophils Absolute 0.0 0.0 - 0.2 x10E3/uL   Immature Granulocytes 0 Not Estab. %   Immature Grans (Abs) 0.0 0.0 - 0.1 x10E3/uL  Bayer DCA Hb A1c Waived  Result Value Ref Range   Bayer DCA Hb A1c Waived 5.3 <7.0 %  Comprehensive metabolic panel  Result Value Ref Range   Glucose 145 (H) 65 - 99 mg/dL   BUN 13 6 - 24 mg/dL   Creatinine, Ser 0.88 0.57 - 1.00 mg/dL   GFR calc non Af Amer 82 >59 mL/min/1.73   GFR calc Af Amer 95 >59 mL/min/1.73   BUN/Creatinine Ratio 15 9 - 23   Sodium 143 134 - 144 mmol/L   Potassium 3.7 3.5 - 5.2 mmol/L   Chloride 105 96 - 106 mmol/L   CO2 23 20 - 29 mmol/L   Calcium 9.0 8.7 - 10.2 mg/dL   Total Protein 6.8 6.0 - 8.5 g/dL   Albumin 4.4 3.5 - 5.5 g/dL   Globulin, Total 2.4 1.5 - 4.5 g/dL   Albumin/Globulin Ratio 1.8 1.2 - 2.2   Bilirubin Total <0.2 0.0 - 1.2 mg/dL   Alkaline Phosphatase 69 39 - 117 IU/L   AST 17 0 - 40 IU/L   ALT 18 0 - 32 IU/L  Lipid Panel Piccolo, Waived  Result Value Ref Range   Cholesterol Piccolo, Waived 138 <200 mg/dL   HDL Chol Piccolo, Waived 30 (L) >59 mg/dL   Triglycerides Piccolo,Waived 202 (H) <150 mg/dL   Chol/HDL Ratio Piccolo,Waive 4.6 mg/dL   LDL Chol Calc Piccolo Waived 68 <100 mg/dL   VLDL Chol Calc Piccolo,Waive 40 (H) <30 mg/dL  Microalbumin, Urine Waived  Result Value Ref Range   Microalb, Ur Waived 10 0 - 19 mg/L   Creatinine, Urine Waived 50 10 - 300 mg/dL   Microalb/Creat  Ratio <30 <30 mg/g      Assessment & Plan:   Problem List Items Addressed This Visit      Other   Iron deficiency anemia - Primary    Likely due to rectal bleeding. Will recheck levels and await results. Continue iron. Call with any concerns.       Relevant Orders   CBC with Differential/Platelet   Iron and TIBC   Ferritin    Other Visit Diagnoses    Rectal bleeding  Likely due to the irritation from her metformin. Will change to long acting and treat with suppositories, if not better, will get into GI next visit.    Diarrhea, unspecified type       Due to her metformin. Will change to long acting and recheck in 2-3 weeks.    Acute bronchitis, unspecified organism       Resolved. Call with any concerns.        Follow up plan: Return 2-3 weeks, for follow up rectal bleeding and anemia.

## 2017-12-01 ENCOUNTER — Telehealth: Payer: Self-pay

## 2017-12-01 LAB — CBC WITH DIFFERENTIAL/PLATELET
BASOS ABS: 0.2 10*3/uL (ref 0.0–0.2)
Basos: 3 %
EOS (ABSOLUTE): 0.1 10*3/uL (ref 0.0–0.4)
Eos: 1 %
HEMATOCRIT: 36.9 % (ref 34.0–46.6)
HEMOGLOBIN: 11 g/dL — AB (ref 11.1–15.9)
LYMPHS: 38 %
Lymphocytes Absolute: 3.1 10*3/uL (ref 0.7–3.1)
MCH: 19.6 pg — ABNORMAL LOW (ref 26.6–33.0)
MCHC: 29.8 g/dL — AB (ref 31.5–35.7)
MCV: 66 fL — ABNORMAL LOW (ref 79–97)
MONOCYTES: 7 %
Monocytes Absolute: 0.6 10*3/uL (ref 0.1–0.9)
NEUTROS PCT: 51 %
Neutrophils Absolute: 4.2 10*3/uL (ref 1.4–7.0)
Platelets: 249 10*3/uL (ref 150–379)
RBC: 5.62 x10E6/uL — AB (ref 3.77–5.28)
RDW: 24.3 % — ABNORMAL HIGH (ref 12.3–15.4)
WBC: 8.2 10*3/uL (ref 3.4–10.8)

## 2017-12-01 LAB — IRON AND TIBC
IRON SATURATION: 8 % — AB (ref 15–55)
IRON: 27 ug/dL (ref 27–159)
Total Iron Binding Capacity: 340 ug/dL (ref 250–450)
UIBC: 313 ug/dL (ref 131–425)

## 2017-12-01 LAB — FERRITIN: FERRITIN: 22 ng/mL (ref 15–150)

## 2017-12-01 NOTE — Telephone Encounter (Signed)
PA started for metformin, waiting for clinical questions to be sent.

## 2017-12-04 ENCOUNTER — Other Ambulatory Visit: Payer: Self-pay | Admitting: Family Medicine

## 2017-12-04 MED ORDER — METFORMIN HCL ER 500 MG PO TB24: 500.0000 mg | ORAL_TABLET | Freq: Every day | ORAL | 1 refills | Status: DC

## 2017-12-21 ENCOUNTER — Ambulatory Visit: Payer: BC Managed Care – PPO | Admitting: Family Medicine

## 2017-12-28 ENCOUNTER — Encounter: Payer: Self-pay | Admitting: Family Medicine

## 2017-12-28 ENCOUNTER — Ambulatory Visit: Payer: BC Managed Care – PPO | Admitting: Family Medicine

## 2017-12-28 VITALS — BP 135/83 | HR 98 | Temp 97.3°F | Wt 246.2 lb

## 2017-12-28 DIAGNOSIS — D509 Iron deficiency anemia, unspecified: Secondary | ICD-10-CM | POA: Diagnosis not present

## 2017-12-28 DIAGNOSIS — F3170 Bipolar disorder, currently in remission, most recent episode unspecified: Secondary | ICD-10-CM | POA: Diagnosis not present

## 2017-12-28 DIAGNOSIS — K625 Hemorrhage of anus and rectum: Secondary | ICD-10-CM | POA: Diagnosis not present

## 2017-12-28 MED ORDER — TRAZODONE HCL 100 MG PO TABS: 100.0000 mg | ORAL_TABLET | Freq: Every evening | ORAL | 3 refills | Status: DC | PRN

## 2017-12-28 NOTE — Assessment & Plan Note (Signed)
Rechecking levels today. Await results. Continue iron. Continuing with rectal bleeding- will get her into GI to be evaluated. Diarrhea much less on the changed metformin.

## 2017-12-28 NOTE — Assessment & Plan Note (Signed)
Having issues with sleep. Seeing psychiatry in May. Trazodone expired. Will get her refill today.

## 2017-12-28 NOTE — Progress Notes (Signed)
BP 135/83   Pulse 98   Temp (!) 97.3 F (36.3 C) (Oral)   Wt 246 lb 3.2 oz (111.7 kg)   SpO2 98%   BMI 33.39 kg/m    Subjective:    Patient ID: [name], female    DOB: [birth year], 42 y.o.   MRN: [id number]  HPI: [name] is a 41 y.o. female  Chief Complaint  Patient presents with  . Anemia   ANEMIA Anemia status: stable Etiology of anemia: iron deficiency Duration of anemia treatment: about a month Compliance with treatment: excellent compliance Iron supplementation side effects: no Severity of anemia: moderate Fatigue: yes Decreased exercise tolerance: yes  Dyspnea on exertion: yes Palpitations: yes Bleeding: yes Pica: no  Diarrhea with rectal bleeding- thought to be due to her metformin. Changed to ER metformin last visit. Much less diarrhea.   Currently not sleeping- going to bed at midnight and getting up at 4. When she had her heart attack, she took her off her seroquel. Mood is feeling well, anxiety is doing very well. Still not sleeping particularly well.   Relevant past medical, surgical, family and social history reviewed and updated as indicated. Interim medical history since our last visit reviewed. Allergies and medications reviewed and updated.  Review of Systems  Constitutional: Positive for fatigue. Negative for activity change, appetite change, chills, diaphoresis, fever and unexpected weight change.  Respiratory: Negative.   Cardiovascular: Negative.   Gastrointestinal: Positive for anal bleeding and blood in stool. Negative for abdominal distention, abdominal pain, constipation, diarrhea, nausea, rectal pain and vomiting.  Psychiatric/Behavioral: Negative.     Per HPI unless specifically indicated above     Objective:    BP 135/83   Pulse 98   Temp (!) 97.3 F (36.3 C) (Oral)   Wt 246 lb 3.2 oz (111.7 kg)   SpO2 98%   BMI 33.39 kg/m   Wt Readings from Last 3 Encounters:  12/28/17 246 lb 3.2 oz (111.7 kg)    11/30/17 246 lb 1 oz (111.6 kg)  11/14/17 247 lb 6 oz (112.2 kg)    Physical Exam  Constitutional: She is oriented to person, place, and time. She appears well-developed and well-nourished. No distress.  HENT:  Head: Normocephalic and atraumatic.  Right Ear: Hearing normal.  Left Ear: Hearing normal.  Nose: Nose normal.  Eyes: Conjunctivae and lids are normal. Right eye exhibits no discharge. Left eye exhibits no discharge. No scleral icterus.  Cardiovascular: Normal rate, regular rhythm, normal heart sounds and intact distal pulses. Exam reveals no gallop and no friction rub.  No murmur heard. Pulmonary/Chest: Effort normal and breath sounds normal. No respiratory distress. She has no wheezes. She has no rales. She exhibits no tenderness.  Abdominal: Soft. Bowel sounds are normal. She exhibits no distension and no mass. There is no tenderness. There is no rebound and no guarding.  Musculoskeletal: Normal range of motion.  Neurological: She is alert and oriented to person, place, and time.  Skin: Skin is warm, dry and intact. No rash noted. She is not diaphoretic. No erythema. No pallor.  Psychiatric: She has a normal mood and affect. Her speech is normal and behavior is normal. Judgment and thought content normal. Cognition and memory are normal.  Nursing note and vitals reviewed.   Results for orders placed or performed in visit on 11/30/17  CBC with Differential/Platelet  Result Value Ref Range   WBC 8.2 3.4 - 10.8 x10E3/uL   RBC 5.62 (H) 3.77 -  5.28 x10E6/uL   Hemoglobin 11.0 (L) 11.1 - 15.9 g/dL   Hematocrit 36.9 34.0 - 46.6 %   MCV 66 (L) 79 - 97 fL   MCH 19.6 (L) 26.6 - 33.0 pg   MCHC 29.8 (L) 31.5 - 35.7 g/dL   RDW 24.3 (H) 12.3 - 15.4 %   Platelets 249 150 - 379 x10E3/uL   Neutrophils 51 Not Estab. %   Lymphs 38 Not Estab. %   Monocytes 7 Not Estab. %   Eos 1 Not Estab. %   Basos 3 Not Estab. %   Neutrophils Absolute 4.2 1.4 - 7.0 x10E3/uL   Lymphocytes Absolute 3.1  0.7 - 3.1 x10E3/uL   Monocytes Absolute 0.6 0.1 - 0.9 x10E3/uL   EOS (ABSOLUTE) 0.1 0.0 - 0.4 x10E3/uL   Basophils Absolute 0.2 0.0 - 0.2 x10E3/uL   Hematology Comments: Note:   Iron and TIBC  Result Value Ref Range   Total Iron Binding Capacity 340 250 - 450 ug/dL   UIBC 313 131 - 425 ug/dL   Iron 27 27 - 159 ug/dL   Iron Saturation 8 (LL) 15 - 55 %  Ferritin  Result Value Ref Range   Ferritin 22 15 - 150 ng/mL      Assessment & Plan:   Problem List Items Addressed This Visit      Other   Bipolar disorder ([place])    Having issues with sleep. Seeing psychiatry in May. Trazodone expired. Will get her refill today.       Iron deficiency anemia - Primary    Rechecking levels today. Await results. Continue iron. Continuing with rectal bleeding- will get her into GI to be evaluated. Diarrhea much less on the changed metformin.       Relevant Orders   CBC with Differential/Platelet   Iron and TIBC   Ferritin   Ambulatory referral to Gastroenterology    Other Visit Diagnoses    Rectal bleeding       Continuing with rectal bleeding- will get her into GI to be evaluated. Diarrhea much less on the changed metformin.    Relevant Orders   Ambulatory referral to Gastroenterology       Follow up plan: Return in about 6 weeks (around 02/08/2018) for Diabetes follow up.

## 2017-12-30 LAB — CBC WITH DIFFERENTIAL/PLATELET
BASOS ABS: 0 10*3/uL (ref 0.0–0.2)
BASOS: 0 %
EOS (ABSOLUTE): 0.1 10*3/uL (ref 0.0–0.4)
Eos: 1 %
HEMATOCRIT: 41.5 % (ref 34.0–46.6)
HEMOGLOBIN: 12.8 g/dL (ref 11.1–15.9)
Immature Grans (Abs): 0 10*3/uL (ref 0.0–0.1)
Immature Granulocytes: 0 %
LYMPHS: 38 %
Lymphocytes Absolute: 3.2 10*3/uL — ABNORMAL HIGH (ref 0.7–3.1)
MCH: 21.8 pg — AB (ref 26.6–33.0)
MCHC: 30.8 g/dL — AB (ref 31.5–35.7)
MCV: 71 fL — ABNORMAL LOW (ref 79–97)
Monocytes Absolute: 0.7 10*3/uL (ref 0.1–0.9)
Monocytes: 8 %
NEUTROS ABS: 4.4 10*3/uL (ref 1.4–7.0)
Neutrophils: 53 %
Platelets: 212 10*3/uL (ref 150–379)
RBC: 5.87 x10E6/uL — ABNORMAL HIGH (ref 3.77–5.28)
WBC: 8.4 10*3/uL (ref 3.4–10.8)

## 2017-12-30 LAB — IRON AND TIBC
Iron Saturation: 6 % — CL (ref 15–55)
Iron: 26 ug/dL — ABNORMAL LOW (ref 27–159)
Total Iron Binding Capacity: 402 ug/dL (ref 250–450)
UIBC: 376 ug/dL (ref 131–425)

## 2017-12-30 LAB — FERRITIN: FERRITIN: 9 ng/mL — AB (ref 15–150)

## 2018-02-06 ENCOUNTER — Ambulatory Visit: Payer: BC Managed Care – PPO | Admitting: Gastroenterology

## 2018-02-06 ENCOUNTER — Encounter: Payer: Self-pay | Admitting: Gastroenterology

## 2018-02-06 VITALS — BP 122/84 | HR 79 | Ht 72.0 in | Wt 237.2 lb

## 2018-02-06 DIAGNOSIS — D509 Iron deficiency anemia, unspecified: Secondary | ICD-10-CM

## 2018-02-06 DIAGNOSIS — Z791 Long term (current) use of non-steroidal anti-inflammatories (NSAID): Secondary | ICD-10-CM | POA: Diagnosis not present

## 2018-02-06 DIAGNOSIS — M222X9 Patellofemoral disorders, unspecified knee: Secondary | ICD-10-CM | POA: Insufficient documentation
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
## 2018-02-07 MED ORDER — PEG 3350-KCL-NA BICARB-NACL 420 G PO SOLR: 4000.0000 mL | Freq: Once | ORAL | 0 refills | Status: AC

## 2018-02-07 NOTE — Addendum Note (Signed)
Addended by: Peggye Ley on: 02/07/2018 08:57 AM   Modules accepted: Orders, SmartSet

## 2018-02-08 LAB — URINALYSIS, ROUTINE W REFLEX MICROSCOPIC
BILIRUBIN UA: NEGATIVE
KETONES UA: NEGATIVE
Leukocytes, UA: NEGATIVE
NITRITE UA: NEGATIVE
PH UA: 6 (ref 5.0–7.5)
Protein, UA: NEGATIVE
SPEC GRAV UA: 1.01 (ref 1.005–1.030)
Urobilinogen, Ur: 0.2 mg/dL (ref 0.2–1.0)

## 2018-02-08 LAB — MICROSCOPIC EXAMINATION: Casts: NONE SEEN /lpf

## 2018-02-08 LAB — CELIAC DISEASE AB SCREEN W/RFX
Antigliadin Abs, IgA: 4 units (ref 0–19)
IgA/Immunoglobulin A, Serum: 165 mg/dL (ref 87–352)
Transglutaminase IgA: <2 U/mL (ref 0–3)

## 2018-02-08 LAB — TSH: TSH: 1.98 u[IU]/mL (ref 0.450–4.500)

## 2018-02-14 ENCOUNTER — Telehealth: Payer: Self-pay

## 2018-02-14 ENCOUNTER — Telehealth: Payer: Self-pay | Admitting: Gastroenterology

## 2018-02-14 NOTE — Telephone Encounter (Signed)
Patient is returning you call for results

## 2018-02-14 NOTE — Telephone Encounter (Signed)
LVM for patient callback for results per Dr. Vicente Males:   - inform celiac serology was negative but urine had some RBC , check if she was on her period. Suggest recheck in a week .

## 2018-02-15 ENCOUNTER — Ambulatory Visit: Payer: BC Managed Care – PPO | Admitting: Family Medicine

## 2018-02-25 ENCOUNTER — Other Ambulatory Visit: Payer: Self-pay | Admitting: Family Medicine

## 2018-03-09 ENCOUNTER — Encounter: Payer: Self-pay | Admitting: Family Medicine

## 2018-03-09 ENCOUNTER — Ambulatory Visit: Payer: BC Managed Care – PPO | Admitting: Family Medicine

## 2018-03-09 VITALS — BP 121/80 | HR 77 | Temp 98.6°F | Wt 241.4 lb

## 2018-03-09 DIAGNOSIS — E1122 Type 2 diabetes mellitus with diabetic chronic kidney disease: Secondary | ICD-10-CM | POA: Diagnosis not present

## 2018-03-09 DIAGNOSIS — N181 Chronic kidney disease, stage 1: Secondary | ICD-10-CM

## 2018-03-09 DIAGNOSIS — I129 Hypertensive chronic kidney disease with stage 1 through stage 4 chronic kidney disease, or unspecified chronic kidney disease: Secondary | ICD-10-CM

## 2018-03-09 DIAGNOSIS — D509 Iron deficiency anemia, unspecified: Secondary | ICD-10-CM | POA: Diagnosis not present

## 2018-03-09 LAB — BAYER DCA HB A1C WAIVED: HB A1C (BAYER DCA - WAIVED): 5.4 % (ref <7.0)

## 2018-03-09 MED ORDER — NITROGLYCERIN 0.4 MG SL SUBL: 0.4000 mg | SUBLINGUAL_TABLET | SUBLINGUAL | 12 refills | Status: DC | PRN

## 2018-03-09 MED ORDER — CETIRIZINE HCL 10 MG PO TABS: 10.0000 mg | ORAL_TABLET | Freq: Every day | ORAL | 11 refills | Status: DC

## 2018-03-09 NOTE — Assessment & Plan Note (Signed)
Rechecking levels today. Await results. Call with any concerns.  

## 2018-03-09 NOTE — Assessment & Plan Note (Signed)
Under good control with A1c of 5.4- will stop jardiance and recheck in 3 months. Call with any concerns.

## 2018-03-09 NOTE — Assessment & Plan Note (Signed)
Under good control. Continue current regimen. Continue to monitor.  

## 2018-03-09 NOTE — Progress Notes (Signed)
BP 121/80 (BP Location: Left Arm, Patient Position: Sitting, Cuff Size: Large)   Pulse 77   Temp 98.6 F (37 C)   Wt 241 lb 7 oz (109.5 kg)   SpO2 98%   BMI 32.74 kg/m    Subjective:    Patient ID: [name], female    DOB: December 05, 1975, 42 y.o.   MRN: [id number]  HPI: [name] is a 42 y.o. female  Chief Complaint  Patient presents with  . Medication Refill    nitroglycerin   ANEMIA Anemia status: stable Etiology of anemia: iron def Duration of anemia treatment: months  Compliance with treatment: excellent compliance Iron supplementation side effects: no Severity of anemia: moderate Fatigue: yes Decreased exercise tolerance: yes  Dyspnea on exertion: no Palpitations: no Bleeding: yes- still having blood in her stool, having colonoscopy in July Pica: yes  DIABETES Hypoglycemic episodes:yes- at the end of the day Polydipsia/polyuria: yes Visual disturbance: no Chest pain: no Paresthesias: no Glucose Monitoring: no  Accucheck frequency: Not Checking Taking Insulin?: no Blood Pressure Monitoring: not checking Retinal Examination: Not up to Date Foot Exam: Up to Date Diabetic Education: Completed Pneumovax: Up to Date Influenza: Up to Date Aspirin: no  Relevant past medical, surgical, family and social history reviewed and updated as indicated. Interim medical history since our last visit reviewed. Allergies and medications reviewed and updated.  Review of Systems  Constitutional: Negative.   Respiratory: Positive for shortness of breath. Negative for apnea, cough, choking, chest tightness, wheezing and stridor.   Cardiovascular: Negative.   Musculoskeletal: Negative.   Neurological: Negative.   Psychiatric/Behavioral: Negative.     Per HPI unless specifically indicated above     Objective:    BP 121/80 (BP Location: Left Arm, Patient Position: Sitting, Cuff Size: Large)   Pulse 77   Temp 98.6 F (37 C)   Wt 241 lb 7 oz (109.5  kg)   SpO2 98%   BMI 32.74 kg/m   Wt Readings from Last 3 Encounters:  03/09/18 241 lb 7 oz (109.5 kg)  02/06/18 237 lb 3.2 oz (107.6 kg)  12/28/17 246 lb 3.2 oz (111.7 kg)    Physical Exam  Constitutional: She is oriented to person, place, and time. She appears well-developed and well-nourished. No distress.  HENT:  Head: Normocephalic and atraumatic.  Right Ear: Hearing normal.  Left Ear: Hearing normal.  Nose: Nose normal.  Eyes: Conjunctivae and lids are normal. Right eye exhibits no discharge. Left eye exhibits no discharge. No scleral icterus.  Cardiovascular: Normal rate, regular rhythm, normal heart sounds and intact distal pulses. Exam reveals no gallop and no friction rub.  No murmur heard. Pulmonary/Chest: Effort normal and breath sounds normal. No stridor. No respiratory distress. She has no wheezes. She has no rales. She exhibits no tenderness.  Musculoskeletal: Normal range of motion.  Neurological: She is alert and oriented to person, place, and time.  Skin: Skin is warm, dry and intact. Capillary refill takes less than 2 seconds. No rash noted. She is not diaphoretic. No erythema. No pallor.  Psychiatric: She has a normal mood and affect. Her speech is normal and behavior is normal. Judgment and thought content normal. Cognition and memory are normal.    Results for orders placed or performed in visit on 02/06/18  Microscopic Examination  Result Value Ref Range   WBC, UA 0-5 0 - 5 /hpf   RBC, UA 0-2 0 - 2 /hpf   Epithelial Cells (non renal) 0-10 0 -  10 /hpf   Casts None seen None seen /lpf   Mucus, UA Present Not Estab.   Bacteria, UA Few None seen/Few  Celiac Disease Ab Screen w/Rfx  Result Value Ref Range   Antigliadin Abs, IgA 4 0 - 19 units   Transglutaminase IgA <2 0 - 3 U/mL   IgA/Immunoglobulin A, Serum 165 87 - 352 mg/dL  Urinalysis, Routine w reflex microscopic  Result Value Ref Range   Specific Gravity, UA 1.010 1.005 - 1.030   pH, UA 6.0 5.0 - 7.5    Color, UA Yellow Yellow   Appearance Ur Clear Clear   Leukocytes, UA Negative Negative   Protein, UA Negative Negative/Trace   Glucose, UA 2+ (A) Negative   Ketones, UA Negative Negative   RBC, UA 2+ (A) Negative   Bilirubin, UA Negative Negative   Urobilinogen, Ur 0.2 0.2 - 1.0 mg/dL   Nitrite, UA Negative Negative   Microscopic Examination See below:   TSH  Result Value Ref Range   TSH 1.980 0.450 - 4.500 uIU/mL      Assessment & Plan:   Problem List Items Addressed This Visit      Endocrine   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 Call with any concerns.       Relevant Orders   Bayer DCA Hb A1c Waived     Genitourinary   Benign hypertensive renal disease    Under good control. Continue current regimen. Continue to monitor.         Other   Iron deficiency anemia    Rechecking levels today. Await results. Call with any concerns.       Relevant Orders   CBC with Differential/Platelet   Iron and TIBC   Ferritin       Follow up plan: Return in about 3 months (around 06/09/2018) for follow up sugars.

## 2018-03-10 LAB — CBC WITH DIFFERENTIAL/PLATELET
BASOS: 0 %
Basophils Absolute: 0 10*3/uL (ref 0.0–0.2)
EOS (ABSOLUTE): 0.1 10*3/uL (ref 0.0–0.4)
EOS: 2 %
HEMATOCRIT: 38.9 % (ref 34.0–46.6)
HEMOGLOBIN: 12.6 g/dL (ref 11.1–15.9)
IMMATURE GRANS (ABS): 0 10*3/uL (ref 0.0–0.1)
IMMATURE GRANULOCYTES: 0 %
LYMPHS: 35 %
Lymphocytes Absolute: 3 10*3/uL (ref 0.7–3.1)
MCH: 24.4 pg — ABNORMAL LOW (ref 26.6–33.0)
MCHC: 32.4 g/dL (ref 31.5–35.7)
MCV: 75 fL — AB (ref 79–97)
MONOCYTES: 6 %
Monocytes Absolute: 0.5 10*3/uL (ref 0.1–0.9)
Neutrophils Absolute: 5 10*3/uL (ref 1.4–7.0)
Neutrophils: 57 %
Platelets: 202 10*3/uL (ref 150–450)
RBC: 5.17 x10E6/uL (ref 3.77–5.28)
RDW: 16.9 % — AB (ref 12.3–15.4)
WBC: 8.7 10*3/uL (ref 3.4–10.8)

## 2018-03-10 LAB — IRON AND TIBC
IRON SATURATION: 8 % — AB (ref 15–55)
IRON: 29 ug/dL (ref 27–159)
Total Iron Binding Capacity: 368 ug/dL (ref 250–450)
UIBC: 339 ug/dL (ref 131–425)

## 2018-03-10 LAB — FERRITIN: Ferritin: 9 ng/mL — ABNORMAL LOW (ref 15–150)

## 2018-03-15 ENCOUNTER — Encounter: Payer: Self-pay | Admitting: Family Medicine

## 2018-03-16 ENCOUNTER — Telehealth: Payer: Self-pay | Admitting: Family Medicine

## 2018-03-16 DIAGNOSIS — D500 Iron deficiency anemia secondary to blood loss (chronic): Secondary | ICD-10-CM

## 2018-03-16 NOTE — Telephone Encounter (Signed)
Please let her know that her iron is looking better, but not great. Up to her if she wants to go see the blood center for an iron infusion or she wants to wait until after her colonoscopy. Let me now if she wants the referral.

## 2018-03-16 NOTE — Telephone Encounter (Signed)
Left message for patient to return phone call.  [name] for [facility] to relay information to patient.

## 2018-03-16 NOTE — Telephone Encounter (Signed)
Patient is requesting the referral be put in for the iron infusion. Please advise. CB#: [phone]

## 2018-03-16 NOTE — Telephone Encounter (Signed)
Routing to provider  

## 2018-03-16 NOTE — Telephone Encounter (Signed)
Referral generated

## 2018-03-20 ENCOUNTER — Inpatient Hospital Stay: Payer: BC Managed Care – PPO | Attending: Internal Medicine | Admitting: Internal Medicine

## 2018-03-20 ENCOUNTER — Encounter: Payer: Self-pay | Admitting: Internal Medicine

## 2018-03-20 ENCOUNTER — Other Ambulatory Visit: Payer: Self-pay

## 2018-03-20 VITALS — BP 130/83 | HR 84 | Temp 97.6°F | Resp 20 | Ht 72.0 in | Wt 240.3 lb

## 2018-03-20 DIAGNOSIS — D509 Iron deficiency anemia, unspecified: Secondary | ICD-10-CM | POA: Diagnosis not present

## 2018-03-20 DIAGNOSIS — D500 Iron deficiency anemia secondary to blood loss (chronic): Secondary | ICD-10-CM
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
## 2018-03-20 NOTE — Assessment & Plan Note (Addendum)
#   Iron deficiency anemia/symptomatic with extreme fatigue pica symptoms.  Patient currently on p.o. Iron; not improving.  Recommend IV iron weekly x4; I discussed the potential IV  infusion reactions.  #Etiology of iron deficiency unclear-await GI work-up with endoscopies.   # weekly IV venofer x4; follow up with me in 2 months/cbc/possible venofer.   #Patient upset that she was unable to get IV iron infusions today/on the day of evaluation.  I had to explain the usual insurance approval prior to IV transfusions.  Thank you Dr.[name] for allowing me to participate in the care of your pleasant patient. Please do not hesitate to contact me with questions or concerns in the interim.  # 45 minutes face-to-face with the patient discussing the above plan of care; more than 50% of time spent on prognosis/ natural history; counseling and coordination.

## 2018-03-20 NOTE — Progress Notes (Signed)
Patient has an apt on July 1 for egd/colonoscopy with  Dr. Reino [facility].  Patient has noted rectal bleeding. Patient denies any heavy menses.

## 2018-03-22 ENCOUNTER — Inpatient Hospital Stay: Payer: BC Managed Care – PPO

## 2018-03-22 VITALS — BP 121/73 | HR 76 | Temp 97.0°F | Resp 16

## 2018-03-22 DIAGNOSIS — D509 Iron deficiency anemia, unspecified: Secondary | ICD-10-CM | POA: Diagnosis not present

## 2018-03-22 DIAGNOSIS — D500 Iron deficiency anemia secondary to blood loss (chronic): Secondary | ICD-10-CM

## 2018-03-22 MED ORDER — SODIUM CHLORIDE 0.9 % IV SOLN
Freq: Once | INTRAVENOUS | Status: AC
  Administered 2018-03-22: 09:00:00 via INTRAVENOUS
  Filled 2018-03-22: qty 1000

## 2018-03-22 MED ORDER — IRON SUCROSE 20 MG/ML IV SOLN
200.0000 mg | Freq: Once | INTRAVENOUS | Status: AC
  Administered 2018-03-22: 200 mg via INTRAVENOUS
  Filled 2018-03-22: qty 10

## 2018-03-29 ENCOUNTER — Ambulatory Visit: Payer: BC Managed Care – PPO

## 2018-03-30 ENCOUNTER — Inpatient Hospital Stay: Payer: BC Managed Care – PPO

## 2018-03-30 VITALS — BP 141/84 | HR 66 | Temp 97.0°F | Resp 16

## 2018-03-30 DIAGNOSIS — D509 Iron deficiency anemia, unspecified: Secondary | ICD-10-CM | POA: Diagnosis not present

## 2018-03-30 DIAGNOSIS — D500 Iron deficiency anemia secondary to blood loss (chronic): Secondary | ICD-10-CM

## 2018-03-30 MED ORDER — IRON SUCROSE 20 MG/ML IV SOLN
INTRAVENOUS | Status: AC
  Filled 2018-03-30: qty 10

## 2018-03-30 MED ORDER — IRON SUCROSE 20 MG/ML IV SOLN
200.0000 mg | Freq: Once | INTRAVENOUS | Status: AC
Start: 2018-03-30 — End: 2018-03-30
  Administered 2018-03-30: 200 mg via INTRAVENOUS
  Filled 2018-03-30: qty 10

## 2018-03-30 MED ORDER — SODIUM CHLORIDE 0.9 % IV SOLN
Freq: Once | INTRAVENOUS | Status: AC
  Administered 2018-03-30: 15:00:00 via INTRAVENOUS
  Filled 2018-03-30: qty 1000

## 2018-04-04 ENCOUNTER — Inpatient Hospital Stay: Payer: BC Managed Care – PPO

## 2018-04-04 VITALS — BP 139/79 | HR 75 | Temp 97.7°F | Resp 16

## 2018-04-04 DIAGNOSIS — D509 Iron deficiency anemia, unspecified: Secondary | ICD-10-CM | POA: Diagnosis not present

## 2018-04-04 DIAGNOSIS — D500 Iron deficiency anemia secondary to blood loss (chronic): Secondary | ICD-10-CM

## 2018-04-04 MED ORDER — SODIUM CHLORIDE 0.9 % IV SOLN
Freq: Once | INTRAVENOUS | Status: AC
  Administered 2018-04-04: 15:00:00 via INTRAVENOUS
  Filled 2018-04-04: qty 1000

## 2018-04-04 MED ORDER — IRON SUCROSE 20 MG/ML IV SOLN
200.0000 mg | Freq: Once | INTRAVENOUS | Status: AC
  Administered 2018-04-04: 200 mg via INTRAVENOUS
  Filled 2018-04-04: qty 10

## 2018-04-04 NOTE — Patient Instructions (Signed)
Iron Sucrose injection What is this medicine? IRON SUCROSE (AHY ern SOO krohs) is an iron complex. Iron is used to make healthy red blood cells, which carry oxygen and nutrients throughout the body. This medicine is used to treat iron deficiency anemia in people with chronic kidney disease. This medicine may be used for other purposes; ask your health care provider or pharmacist if you have questions. COMMON BRAND NAME(S): Venofer What should I tell my health care provider before I take this medicine? They need to know if you have any of these conditions: -anemia not caused by low iron levels -heart disease -high levels of iron in the blood -kidney disease -liver disease -an unusual or allergic reaction to iron, other medicines, foods, dyes, or preservatives -pregnant or trying to get pregnant -breast-feeding How should I use this medicine? This medicine is for infusion into a vein. It is given by a health care professional in a hospital or clinic setting. Talk to your pediatrician regarding the use of this medicine in children. While this drug may be prescribed for children as young as 2 years for selected conditions, precautions do apply. Overdosage: If you think you have taken too much of this medicine contact a poison control center or emergency room at once. NOTE: This medicine is only for you. Do not share this medicine with others. What if I miss a dose? It is important not to miss your dose. Call your doctor or health care professional if you are unable to keep an appointment. What may interact with this medicine? Do not take this medicine with any of the following medications: -deferoxamine -dimercaprol -other iron products This medicine may also interact with the following medications: -chloramphenicol -deferasirox This list may not describe all possible interactions. Give your health care provider a list of all the medicines, herbs, non-prescription drugs, or dietary  supplements you use. Also tell them if you smoke, drink alcohol, or use illegal drugs. Some items may interact with your medicine. What should I watch for while using this medicine? Visit your doctor or healthcare professional regularly. Tell your doctor or healthcare professional if your symptoms do not start to get better or if they get worse. You may need blood work done while you are taking this medicine. You may need to follow a special diet. Talk to your doctor. Foods that contain iron include: whole grains/cereals, dried fruits, beans, or peas, leafy green vegetables, and organ meats (liver, kidney). What side effects may I notice from receiving this medicine? Side effects that you should report to your doctor or health care professional as soon as possible: -allergic reactions like skin rash, itching or hives, swelling of the face, lips, or tongue -breathing problems -changes in blood pressure -cough -fast, irregular heartbeat -feeling faint or lightheaded, falls -fever or chills -flushing, sweating, or hot feelings -joint or muscle aches/pains -seizures -swelling of the ankles or feet -unusually weak or tired Side effects that usually do not require medical attention (report to your doctor or health care professional if they continue or are bothersome): -diarrhea -feeling achy -headache -irritation at site where injected -nausea, vomiting -stomach upset -tiredness This list may not describe all possible side effects. Call your doctor for medical advice about side effects. You may report side effects to FDA at 1-800-FDA-1088. Where should I keep my medicine? This drug is given in a hospital or clinic and will not be stored at home. NOTE: This sheet is a summary. It may not cover all possible information. If   you have questions about this medicine, talk to your doctor, pharmacist, or health care provider.  2018 Elsevier/Gold Standard (2011-07-14 17:14:35)  

## 2018-04-05 ENCOUNTER — Ambulatory Visit: Payer: BC Managed Care – PPO

## 2018-04-11 ENCOUNTER — Inpatient Hospital Stay: Payer: BC Managed Care – PPO

## 2018-04-11 VITALS — BP 136/83 | HR 80 | Temp 97.3°F | Resp 16

## 2018-04-11 DIAGNOSIS — D509 Iron deficiency anemia, unspecified: Secondary | ICD-10-CM | POA: Diagnosis not present

## 2018-04-11 DIAGNOSIS — D500 Iron deficiency anemia secondary to blood loss (chronic): Secondary | ICD-10-CM

## 2018-04-11 MED ORDER — SODIUM CHLORIDE 0.9 % IV SOLN
Freq: Once | INTRAVENOUS | Status: AC
  Administered 2018-04-11: 15:00:00 via INTRAVENOUS
  Filled 2018-04-11: qty 1000

## 2018-04-11 MED ORDER — IRON SUCROSE 20 MG/ML IV SOLN
200.0000 mg | Freq: Once | INTRAVENOUS | Status: AC
  Administered 2018-04-11: 200 mg via INTRAVENOUS
  Filled 2018-04-11: qty 10

## 2018-04-11 NOTE — Patient Instructions (Signed)
Iron Sucrose injection What is this medicine? IRON SUCROSE (AHY ern SOO krohs) is an iron complex. Iron is used to make healthy red blood cells, which carry oxygen and nutrients throughout the body. This medicine is used to treat iron deficiency anemia in people with chronic kidney disease. This medicine may be used for other purposes; ask your health care provider or pharmacist if you have questions. COMMON BRAND NAME(S): Venofer What should I tell my health care provider before I take this medicine? They need to know if you have any of these conditions: -anemia not caused by low iron levels -heart disease -high levels of iron in the blood -kidney disease -liver disease -an unusual or allergic reaction to iron, other medicines, foods, dyes, or preservatives -pregnant or trying to get pregnant -breast-feeding How should I use this medicine? This medicine is for infusion into a vein. It is given by a health care professional in a hospital or clinic setting. Talk to your pediatrician regarding the use of this medicine in children. While this drug may be prescribed for children as young as 2 years for selected conditions, precautions do apply. Overdosage: If you think you have taken too much of this medicine contact a poison control center or emergency room at once. NOTE: This medicine is only for you. Do not share this medicine with others. What if I miss a dose? It is important not to miss your dose. Call your doctor or health care professional if you are unable to keep an appointment. What may interact with this medicine? Do not take this medicine with any of the following medications: -deferoxamine -dimercaprol -other iron products This medicine may also interact with the following medications: -chloramphenicol -deferasirox This list may not describe all possible interactions. Give your health care provider a list of all the medicines, herbs, non-prescription drugs, or dietary  supplements you use. Also tell them if you smoke, drink alcohol, or use illegal drugs. Some items may interact with your medicine. What should I watch for while using this medicine? Visit your doctor or healthcare professional regularly. Tell your doctor or healthcare professional if your symptoms do not start to get better or if they get worse. You may need blood work done while you are taking this medicine. You may need to follow a special diet. Talk to your doctor. Foods that contain iron include: whole grains/cereals, dried fruits, beans, or peas, leafy green vegetables, and organ meats (liver, kidney). What side effects may I notice from receiving this medicine? Side effects that you should report to your doctor or health care professional as soon as possible: -allergic reactions like skin rash, itching or hives, swelling of the face, lips, or tongue -breathing problems -changes in blood pressure -cough -fast, irregular heartbeat -feeling faint or lightheaded, falls -fever or chills -flushing, sweating, or hot feelings -joint or muscle aches/pains -seizures -swelling of the ankles or feet -unusually weak or tired Side effects that usually do not require medical attention (report to your doctor or health care professional if they continue or are bothersome): -diarrhea -feeling achy -headache -irritation at site where injected -nausea, vomiting -stomach upset -tiredness This list may not describe all possible side effects. Call your doctor for medical advice about side effects. You may report side effects to FDA at 1-800-FDA-1088. Where should I keep my medicine? This drug is given in a hospital or clinic and will not be stored at home. NOTE: This sheet is a summary. It may not cover all possible information. If   you have questions about this medicine, talk to your doctor, pharmacist, or health care provider.  2018 Elsevier/Gold Standard (2011-07-14 17:14:35)  

## 2018-04-12 ENCOUNTER — Ambulatory Visit: Payer: BC Managed Care – PPO

## 2018-04-14 ENCOUNTER — Encounter: Payer: Self-pay | Admitting: Gastroenterology

## 2018-04-16 ENCOUNTER — Other Ambulatory Visit: Payer: Self-pay

## 2018-04-16 ENCOUNTER — Encounter: Payer: Self-pay | Admitting: Anesthesiology

## 2018-04-16 ENCOUNTER — Ambulatory Visit
Admission: RE | Admit: 2018-04-16 | Discharge: 2018-04-16 | Disposition: A | Discharge status: Home / Self Care | Payer: BC Managed Care – PPO | Source: Ambulatory Visit | Attending: Gastroenterology | Admitting: Gastroenterology

## 2018-04-16 ENCOUNTER — Encounter
Admission: RE | Disposition: A | Discharge status: Home / Self Care | Payer: Self-pay | Source: Ambulatory Visit | Attending: Gastroenterology

## 2018-04-16 ENCOUNTER — Ambulatory Visit: Payer: BC Managed Care – PPO | Admitting: Anesthesiology

## 2018-04-16 DIAGNOSIS — D649 Anemia, unspecified: Secondary | ICD-10-CM | POA: Insufficient documentation

## 2018-04-16 DIAGNOSIS — E669 Obesity, unspecified: Secondary | ICD-10-CM | POA: Diagnosis not present

## 2018-04-16 DIAGNOSIS — Z7984 Long term (current) use of oral hypoglycemic drugs: Secondary | ICD-10-CM | POA: Insufficient documentation

## 2018-04-16 DIAGNOSIS — K621 Rectal polyp: Secondary | ICD-10-CM | POA: Diagnosis not present

## 2018-04-16 DIAGNOSIS — E119 Type 2 diabetes mellitus without complications: Secondary | ICD-10-CM | POA: Diagnosis not present

## 2018-04-16 DIAGNOSIS — I252 Old myocardial infarction: Secondary | ICD-10-CM | POA: Diagnosis not present

## 2018-04-16 DIAGNOSIS — F17210 Nicotine dependence, cigarettes, uncomplicated: Secondary | ICD-10-CM | POA: Diagnosis not present

## 2018-04-16 DIAGNOSIS — K3189 Other diseases of stomach and duodenum: Secondary | ICD-10-CM | POA: Insufficient documentation

## 2018-04-16 DIAGNOSIS — K635 Polyp of colon: Secondary | ICD-10-CM | POA: Diagnosis not present

## 2018-04-16 DIAGNOSIS — Z79899 Other long term (current) drug therapy: Secondary | ICD-10-CM | POA: Diagnosis not present

## 2018-04-16 DIAGNOSIS — D125 Benign neoplasm of sigmoid colon: Secondary | ICD-10-CM

## 2018-04-16 DIAGNOSIS — Z6832 Body mass index (BMI) 32.0-32.9, adult: Secondary | ICD-10-CM | POA: Diagnosis not present

## 2018-04-16 DIAGNOSIS — D509 Iron deficiency anemia, unspecified: Secondary | ICD-10-CM | POA: Diagnosis not present

## 2018-04-16 DIAGNOSIS — I10 Essential (primary) hypertension: Secondary | ICD-10-CM | POA: Insufficient documentation

## 2018-04-16 DIAGNOSIS — F319 Bipolar disorder, unspecified: Secondary | ICD-10-CM | POA: Insufficient documentation

## 2018-04-16 DIAGNOSIS — D72820 Lymphocytosis (symptomatic): Secondary | ICD-10-CM | POA: Diagnosis not present

## 2018-04-16 HISTORY — DX: Chronic kidney disease, unspecified: N18.9

## 2018-04-16 HISTORY — DX: Other fracture of shaft of left fibula, initial encounter for closed fracture: S82.492A

## 2018-04-16 HISTORY — DX: Personal history of urinary calculi: Z87.442

## 2018-04-16 HISTORY — DX: Essential (primary) hypertension: I10

## 2018-04-16 HISTORY — PX: ESOPHAGOGASTRODUODENOSCOPY (EGD) WITH PROPOFOL: SHX5813

## 2018-04-16 HISTORY — PX: COLONOSCOPY WITH PROPOFOL: SHX5780

## 2018-04-16 LAB — GLUCOSE, CAPILLARY: Glucose-Capillary: 110 mg/dL — ABNORMAL HIGH (ref 70–99)

## 2018-04-16 LAB — POCT PREGNANCY, URINE: PREG TEST UR: NEGATIVE

## 2018-04-16 SURGERY — COLONOSCOPY WITH PROPOFOL
Anesthesia: General

## 2018-04-16 MED ORDER — PROPOFOL 10 MG/ML IV BOLUS
INTRAVENOUS | Status: DC | PRN
  Administered 2018-04-16: 50 mg via INTRAVENOUS
  Administered 2018-04-16: 100 mg via INTRAVENOUS

## 2018-04-16 MED ORDER — FENTANYL CITRATE (PF) 100 MCG/2ML IJ SOLN
INTRAMUSCULAR | Status: AC
  Filled 2018-04-16: qty 2

## 2018-04-16 MED ORDER — FENTANYL CITRATE (PF) 100 MCG/2ML IJ SOLN
INTRAMUSCULAR | Status: DC | PRN
  Administered 2018-04-16: 50 ug via INTRAVENOUS
  Administered 2018-04-16 (×2): 25 ug via INTRAVENOUS

## 2018-04-16 MED ORDER — PROPOFOL 500 MG/50ML IV EMUL
INTRAVENOUS | Status: DC | PRN
  Administered 2018-04-16: 70 ug/kg/min via INTRAVENOUS

## 2018-04-16 MED ORDER — SODIUM CHLORIDE 0.9 % IV SOLN
INTRAVENOUS | Status: DC | PRN
  Administered 2018-04-16: 08:00:00 via INTRAVENOUS

## 2018-04-16 MED ORDER — PROPOFOL 10 MG/ML IV BOLUS
INTRAVENOUS | Status: AC
  Filled 2018-04-16: qty 20

## 2018-04-16 MED ORDER — SODIUM CHLORIDE 0.9 % IV SOLN
INTRAVENOUS | Status: DC
  Administered 2018-04-16: 08:00:00 via INTRAVENOUS

## 2018-04-16 MED ORDER — LIDOCAINE HCL (CARDIAC) PF 100 MG/5ML IV SOSY
PREFILLED_SYRINGE | INTRAVENOUS | Status: DC | PRN
  Administered 2018-04-16: 80 mg via INTRAVENOUS

## 2018-04-16 NOTE — Op Note (Signed)
[facility] Gastroenterology Patient Name: [name] Procedure Date: 04/16/2018 8:28 AM MRN: [id number] Account #: [id number] Date of Birth: 06/01/1976 Admit Type: Outpatient Age: 42 Room: [facility] ENDO ROOM 4 Gender: Female Note Status: Finalized Procedure:            Colonoscopy Indications:          Iron deficiency anemia Providers:            [name] MD, MD Referring MD:         Valerie Roys (Referring MD) Medicines:            Monitored Anesthesia Care Complications:        No immediate complications. Procedure:            Pre-Anesthesia Assessment:                       - ASA Grade Assessment: III - A patient with severe                        systemic disease.                       After obtaining informed consent, the colonoscope was                        passed under direct vision. Throughout the procedure,                        the patient's blood pressure, pulse, and oxygen                        saturations were monitored continuously. The                        Colonoscope was introduced through the anus and                        advanced to the the cecum, identified by the                        appendiceal orifice, IC valve and transillumination.                        The colonoscopy was performed with ease. The patient                        tolerated the procedure well. The quality of the bowel                        preparation was good. Findings:      The perianal and digital rectal examinations were normal.      Four sessile polyps were found in the rectum and sigmoid colon. The       polyps were 3 to 4 mm in size. 2 polyps in the rectum and two in the       sigmoid colon      The exam was otherwise without abnormality on direct and retroflexion       views. Impression:           - Four 3 to 4 mm polyps in the rectum and in the  sigmoid colon.                       - The examination was otherwise normal on  direct and                        retroflexion views.                       - No specimens collected. Recommendation:       - Discharge patient to home (with escort).                       - Resume previous diet.                       - Continue present medications.                       - Await pathology results.                       - Repeat colonoscopy for surveillance based on                        pathology results.                       - To visualize the small bowel, perform video capsule                        endoscopy in 2 weeks. Procedure Code(s):    --- Professional ---                       [phone], Colonoscopy, flexible; diagnostic, including                        collection of specimen(s) by brushing or washing, when                        performed (separate procedure) Diagnosis Code(s):    --- Professional ---                       K62.1, Rectal polyp                       D50.9, Iron deficiency anemia, unspecified                       D12.5, Benign neoplasm of sigmoid colon CPT copyright 2017 American Medical Association. All rights reserved. The codes documented in this report are preliminary and upon coder review may  be revised to meet current compliance requirements. [name], MD [name] MD, MD 04/16/2018 9:10:27 AM This report has been signed electronically. Number of Addenda: 0 Note Initiated On: 04/16/2018 8:28 AM Scope Withdrawal Time: 0 hours 14 minutes 37 seconds  Total Procedure Duration: 0 hours 16 minutes 39 seconds       [facility]

## 2018-04-16 NOTE — Op Note (Signed)
[facility] Gastroenterology Patient Name: [name] Procedure Date: 04/16/2018 8:28 AM MRN: [id number] Account #: [id number] Date of Birth: 1975/11/13 Admit Type: Outpatient Age: 42 Room: [facility] ENDO ROOM 4 Gender: Female Note Status: Finalized Procedure:            Upper GI endoscopy Indications:          Iron deficiency anemia Providers:            [name] MD, MD Referring MD:         Valerie Roys (Referring MD) Medicines:            Monitored Anesthesia Care Complications:        No immediate complications. Procedure:            Pre-Anesthesia Assessment:                       - Prior to the procedure, a History and Physical was                        performed, and patient medications, allergies and                        sensitivities were reviewed. The patient's tolerance of                        previous anesthesia was reviewed.                       - The risks and benefits of the procedure and the                        sedation options and risks were discussed with the                        patient. All questions were answered and informed                        consent was obtained.                       - ASA Grade Assessment: II - A patient with mild                        systemic disease.                       After obtaining informed consent, the endoscope was                        passed under direct vision. Throughout the procedure,                        the patient's blood pressure, pulse, and oxygen                        saturations were monitored continuously. The Endoscope                        was introduced through the mouth, and advanced to the  third part of duodenum. The upper GI endoscopy was                        accomplished without difficulty. The patient tolerated                        the procedure well. Findings:      The esophagus was normal.      The stomach was normal.      The  examined duodenum was normal. Biopsies for histology were taken with       a cold forceps for evaluation of celiac disease.      The cardia and gastric fundus were normal on retroflexion. Impression:           - Normal esophagus.                       - Normal stomach.                       - Normal examined duodenum. Biopsied. Recommendation:       - Await pathology results.                       - Perform a colonoscopy today. Procedure Code(s):    --- Professional ---                       [phone], Esophagogastroduodenoscopy, flexible, transoral;                        with biopsy, single or multiple Diagnosis Code(s):    --- Professional ---                       D50.9, Iron deficiency anemia, unspecified CPT copyright 2017 American Medical Association. All rights reserved. The codes documented in this report are preliminary and upon coder review may  be revised to meet current compliance requirements. [name], MD [name] MD, MD 04/16/2018 8:50:15 AM This report has been signed electronically. Number of Addenda: 0 Note Initiated On: 04/16/2018 8:28 AM      [facility]
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
## 2018-04-16 NOTE — Anesthesia Post-op Follow-up Note (Signed)
Anesthesia QCDR form completed.        

## 2018-04-16 NOTE — Anesthesia Postprocedure Evaluation (Signed)
Anesthesia Post Note  Patient: Itzamar Traynor Border  Procedure(s) Performed: COLONOSCOPY WITH PROPOFOL (N/A ) ESOPHAGOGASTRODUODENOSCOPY (EGD) WITH PROPOFOL (N/A )  Patient location during evaluation: Endoscopy Anesthesia Type: General Level of consciousness: awake and alert Pain management: pain level controlled Vital Signs Assessment: post-procedure vital signs reviewed and stable Respiratory status: spontaneous breathing, nonlabored ventilation, respiratory function stable and patient connected to nasal cannula oxygen Cardiovascular status: blood pressure returned to baseline and stable Postop Assessment: no apparent nausea or vomiting Anesthetic complications: no     Last Vitals:  Vitals:   04/16/18 0920 04/16/18 0935  BP: 111/71 125/65  Pulse: 77 72  Resp: 16 16  Temp:    SpO2: 97% 98%    Last Pain:  Vitals:   04/16/18 0935  TempSrc:   PainSc: 0-No pain                 Y[place] Blucher S

## 2018-04-16 NOTE — Transfer of Care (Signed)
Immediate Anesthesia Transfer of Care Note  Patient: [name]  Procedure(s) Performed: COLONOSCOPY WITH PROPOFOL (N/A ) ESOPHAGOGASTRODUODENOSCOPY (EGD) WITH PROPOFOL (N/A )  Patient Location: PACU  Anesthesia Type:General  Level of Consciousness: awake, alert  and oriented  Airway & Oxygen Therapy: Patient Spontanous Breathing and Patient connected to nasal cannula oxygen  Post-op Assessment: Report given to RN and Post -op Vital signs reviewed and stable  Post vital signs: Reviewed and stable  Last Vitals:  Vitals Value Taken Time  BP    Temp    Pulse    Resp    SpO2      Last Pain:  Vitals:   04/16/18 0808  TempSrc: Tympanic  PainSc: 0-No pain         Complications: No apparent anesthesia complications

## 2018-04-16 NOTE — Anesthesia Preprocedure Evaluation (Addendum)
Anesthesia Evaluation  Patient identified by MRN, date of birth, ID band Patient awake    Reviewed: Allergy & Precautions, NPO status , Patient's Chart, lab work & pertinent test results, reviewed documented beta blocker date and time   Airway Mallampati: III  TM Distance: >3 FB     Dental  (+) Chipped   Pulmonary Current Smoker,           Cardiovascular hypertension, + Past MI       Neuro/Psych PSYCHIATRIC DISORDERS Bipolar Disorder    GI/Hepatic   Endo/Other  diabetes, Type 2  Renal/GU Renal disease     Musculoskeletal  (+) Arthritis ,   Abdominal   Peds  Hematology  (+) anemia ,   Anesthesia Other Findings Obese. Smokes.  Reproductive/Obstetrics                            Anesthesia Physical Anesthesia Plan  ASA: III  Anesthesia Plan: General   Post-op Pain Management:    Induction: Intravenous  PONV Risk Score and Plan:   Airway Management Planned:   Additional Equipment:   Intra-op Plan:   Post-operative Plan:   Informed Consent: I have reviewed the patients History and Physical, chart, labs and discussed the procedure including the risks, benefits and alternatives for the proposed anesthesia with the patient or authorized representative who has indicated his/her understanding and acceptance.     Plan Discussed with: CRNA  Anesthesia Plan Comments:         Anesthesia Quick Evaluation

## 2018-04-17 ENCOUNTER — Other Ambulatory Visit: Payer: Self-pay

## 2018-04-17 ENCOUNTER — Encounter: Payer: Self-pay | Admitting: Gastroenterology

## 2018-04-17 DIAGNOSIS — D509 Iron deficiency anemia, unspecified: Secondary | ICD-10-CM

## 2018-04-17 LAB — SURGICAL PATHOLOGY

## 2018-04-18 ENCOUNTER — Other Ambulatory Visit: Payer: Self-pay

## 2018-04-18 MED ORDER — SUCRALFATE 1 GM/10ML PO SUSP: 1.0000 g | Freq: Four times a day (QID) | ORAL | 2 refills | Status: DC

## 2018-04-22 ENCOUNTER — Other Ambulatory Visit: Payer: Self-pay | Admitting: Family Medicine

## 2018-05-03 ENCOUNTER — Other Ambulatory Visit: Payer: Self-pay | Admitting: Family Medicine

## 2018-05-06 ENCOUNTER — Encounter: Payer: Self-pay | Admitting: Gastroenterology

## 2018-05-07 ENCOUNTER — Encounter: Admission: RE | Payer: Self-pay | Source: Ambulatory Visit

## 2018-05-07 ENCOUNTER — Ambulatory Visit
Admission: RE | Admit: 2018-05-07 | Payer: BC Managed Care – PPO | Source: Ambulatory Visit | Admitting: Gastroenterology

## 2018-05-07 SURGERY — IMAGING PROCEDURE, GI TRACT, INTRALUMINAL, VIA CAPSULE

## 2018-05-08 ENCOUNTER — Encounter: Payer: Self-pay | Admitting: Gastroenterology

## 2018-05-08 ENCOUNTER — Telehealth: Payer: Self-pay

## 2018-05-08 NOTE — Telephone Encounter (Signed)
-----   Message from [name], MD sent at 05/08/2018 10:31 AM EDT ----- [name] - looks like she cancelled capsule study - check if she would like to reschedule , duodenal bx showed some villous blunting suggest to stop NSAID's if taking any. Celiac serology was negative  C/c [name], Megan P, DO

## 2018-05-08 NOTE — Telephone Encounter (Signed)
Pt notified of results. 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

## 2018-05-09 ENCOUNTER — Ambulatory Visit: Payer: BC Managed Care – PPO | Admitting: Family Medicine

## 2018-05-09 ENCOUNTER — Other Ambulatory Visit: Payer: Self-pay

## 2018-05-09 ENCOUNTER — Encounter: Payer: Self-pay | Admitting: Family Medicine

## 2018-05-09 VITALS — BP 127/84 | HR 91 | Temp 98.2°F | Ht 72.0 in | Wt 248.0 lb

## 2018-05-09 DIAGNOSIS — J029 Acute pharyngitis, unspecified: Secondary | ICD-10-CM

## 2018-05-09 DIAGNOSIS — K122 Cellulitis and abscess of mouth: Secondary | ICD-10-CM | POA: Diagnosis not present

## 2018-05-09 MED ORDER — LIDOCAINE VISCOUS HCL 2 % MT SOLN: 5.0000 mL | OROMUCOSAL | 0 refills | Status: DC | PRN

## 2018-05-09 NOTE — Progress Notes (Signed)
   BP 127/84   Pulse 91   Temp 98.2 F (36.8 C) (Oral)   Ht 6' (1.829 m)   Wt 248 lb (112.5 kg)   LMP 04/13/2018   SpO2 97%   BMI 33.63 kg/m    Subjective:    Patient ID: [name], female    DOB: 10/19/1975, 42 y.o.   MRN: [id number]  HPI: [name] is a 42 y.o. female  Chief Complaint  Patient presents with  . Sore Throat    swollen, dull pain throat for a few days ago  . Cough   Swollen painful throat/uvula x several days. Able to swallow, no difficulty breathing. Gargled listerine and drank lots of water. Has had this happen in the past, seemed to get some relief with allergy regimen. No new medications or foods. Denies fevers, chills, congestion, headache,N/V/D.   Relevant past medical, surgical, family and social history reviewed and updated as indicated. Interim medical history since our last visit reviewed. Allergies and medications reviewed and updated.  Review of Systems  Per HPI unless specifically indicated above     Objective:    BP 127/84   Pulse 91   Temp 98.2 F (36.8 C) (Oral)   Ht 6' (1.829 m)   Wt 248 lb (112.5 kg)   LMP 04/13/2018   SpO2 97%   BMI 33.63 kg/m   Wt Readings from Last 3 Encounters:  05/09/18 248 lb (112.5 kg)  04/16/18 243 lb (110.2 kg)  03/20/18 240 lb 4.8 oz (109 kg)    Physical Exam  Constitutional: She is oriented to person, place, and time. She appears well-developed and well-nourished. No distress.  HENT:  Head: Atraumatic.  Right Ear: External ear normal.  Left Ear: External ear normal.  Nose: Nose normal.  Oropharynx erythematous, uvula erythematous and mildly edematous. No exudates  Eyes: Pupils are equal, round, and reactive to light. Conjunctivae are normal.  Neck: Normal range of motion. Neck supple.  Cardiovascular: Normal rate, regular rhythm and normal heart sounds.  Pulmonary/Chest: Effort normal and breath sounds normal. No respiratory distress.  Musculoskeletal: Normal range of motion.   Neurological: She is alert and oriented to person, place, and time.  Skin: Skin is warm and dry.  Psychiatric: She has a normal mood and affect. Her behavior is normal.  Nursing note and vitals reviewed.   Results for orders placed or performed in visit on 05/09/18  Rapid Strep Screen ([facility] ONLY)  Result Value Ref Range   Strep Gp A Ag, IA W/Reflex Negative Negative  Culture, Group A Strep  Result Value Ref Range   Strep A Culture Negative       Assessment & Plan:   Problem List Items Addressed This Visit    None    Visit Diagnoses    Uvulitis    -  Primary   Likely allergic, tx with prednisone, zyrtec, flonase, salt water gargles, viscous lidocaine prn. F/u if worsening. ER if difficulty breathing or swallowing   Sore throat       Rapid strep neg, await cx.    Relevant Orders   Rapid Strep Screen ([facility] ONLY) (Completed)       Follow up plan: Return if symptoms worsen or fail to improve.

## 2018-05-11 LAB — CULTURE, GROUP A STREP: STREP A CULTURE: NEGATIVE

## 2018-05-11 LAB — RAPID STREP SCREEN (MED CTR MEBANE ONLY): STREP GP A AG, IA W/REFLEX: NEGATIVE

## 2018-05-11 MED ORDER — PREDNISONE 20 MG PO TABS
40.0000 mg | ORAL_TABLET | Freq: Every day | ORAL | 0 refills | Status: DC
Start: 2018-05-11 — End: 2018-05-15

## 2018-05-12 NOTE — Patient Instructions (Signed)
Follow up as needed

## 2018-05-15 ENCOUNTER — Other Ambulatory Visit: Payer: Self-pay | Admitting: Family Medicine

## 2018-05-16 NOTE — Telephone Encounter (Signed)
LOV 05/11/18 Dr. Wynetta Emery Pt. States she took her last Prednisone this morning. Is still having some swelling. Is going to [place] tomorrow and would like to have the Prednisone on hand "just in case." Please refill today, if possible. Thanks.

## 2018-05-16 NOTE — Telephone Encounter (Signed)
Left message for pt. To call back in regard to Prednisone refill. Is she having symptoms that she had earlier in the month?

## 2018-05-22 ENCOUNTER — Other Ambulatory Visit: Payer: BC Managed Care – PPO

## 2018-05-22 ENCOUNTER — Ambulatory Visit: Payer: BC Managed Care – PPO | Admitting: Internal Medicine

## 2018-05-22 ENCOUNTER — Ambulatory Visit: Payer: BC Managed Care – PPO

## 2018-05-24 ENCOUNTER — Other Ambulatory Visit: Payer: Self-pay | Admitting: Family Medicine

## 2018-05-24 ENCOUNTER — Other Ambulatory Visit: Payer: Self-pay | Admitting: *Deleted

## 2018-05-24 DIAGNOSIS — D500 Iron deficiency anemia secondary to blood loss (chronic): Secondary | ICD-10-CM

## 2018-05-24 NOTE — Telephone Encounter (Signed)
Trazodone 100 mg refill request  LOV 03/09/18 with Dr. Wynetta Emery  LR:  04/23/18  #30  Refills:  0  [facility] [place], [place]

## 2018-05-29 ENCOUNTER — Other Ambulatory Visit: Payer: Self-pay

## 2018-05-29 ENCOUNTER — Inpatient Hospital Stay: Payer: BC Managed Care – PPO

## 2018-05-29 ENCOUNTER — Telehealth: Payer: Self-pay

## 2018-05-29 ENCOUNTER — Ambulatory Visit (INDEPENDENT_AMBULATORY_CARE_PROVIDER_SITE_OTHER): Payer: BC Managed Care – PPO | Admitting: Family Medicine

## 2018-05-29 ENCOUNTER — Encounter: Payer: Self-pay | Admitting: Family Medicine

## 2018-05-29 ENCOUNTER — Inpatient Hospital Stay: Payer: BC Managed Care – PPO | Attending: Internal Medicine | Admitting: Internal Medicine

## 2018-05-29 ENCOUNTER — Encounter: Payer: Self-pay | Admitting: Internal Medicine

## 2018-05-29 VITALS — BP 123/85 | HR 96 | Temp 97.6°F | Resp 20

## 2018-05-29 VITALS — BP 133/81 | HR 69 | Temp 97.9°F | Ht 68.3 in | Wt 247.2 lb

## 2018-05-29 DIAGNOSIS — E1122 Type 2 diabetes mellitus with diabetic chronic kidney disease: Secondary | ICD-10-CM | POA: Diagnosis not present

## 2018-05-29 DIAGNOSIS — D500 Iron deficiency anemia secondary to blood loss (chronic): Secondary | ICD-10-CM | POA: Diagnosis not present

## 2018-05-29 DIAGNOSIS — D509 Iron deficiency anemia, unspecified: Secondary | ICD-10-CM | POA: Diagnosis not present

## 2018-05-29 DIAGNOSIS — I129 Hypertensive chronic kidney disease with stage 1 through stage 4 chronic kidney disease, or unspecified chronic kidney disease: Secondary | ICD-10-CM | POA: Diagnosis not present

## 2018-05-29 DIAGNOSIS — N181 Chronic kidney disease, stage 1: Secondary | ICD-10-CM

## 2018-05-29 DIAGNOSIS — T39395A Adverse effect of other nonsteroidal anti-inflammatory drugs [NSAID], initial encounter: Secondary | ICD-10-CM

## 2018-05-29 DIAGNOSIS — M222X9 Patellofemoral disorders, unspecified knee: Secondary | ICD-10-CM

## 2018-05-29 DIAGNOSIS — K259 Gastric ulcer, unspecified as acute or chronic, without hemorrhage or perforation: Secondary | ICD-10-CM | POA: Insufficient documentation

## 2018-05-29 DIAGNOSIS — Z6837 Body mass index (BMI) 37.0-37.9, adult: Secondary | ICD-10-CM

## 2018-05-29 LAB — CBC WITH DIFFERENTIAL/PLATELET
BASOS ABS: 0 10*3/uL (ref 0–0.1)
Basophils Relative: 1 %
EOS ABS: 0.1 10*3/uL (ref 0–0.7)
EOS PCT: 1 %
HCT: 44.6 % (ref 35.0–47.0)
Hemoglobin: 14.7 g/dL (ref 12.0–16.0)
LYMPHS PCT: 33 %
Lymphs Abs: 2.5 10*3/uL (ref 1.0–3.6)
MCH: 26.5 pg (ref 26.0–34.0)
MCHC: 33.1 g/dL (ref 32.0–36.0)
MCV: 80.1 fL (ref 80.0–100.0)
Monocytes Absolute: 0.6 10*3/uL (ref 0.2–0.9)
Monocytes Relative: 8 %
NEUTROS PCT: 57 %
Neutro Abs: 4.3 10*3/uL (ref 1.4–6.5)
Platelets: 169 10*3/uL (ref 150–440)
RBC: 5.56 MIL/uL — AB (ref 3.80–5.20)
RDW: 19.3 % — ABNORMAL HIGH (ref 11.5–14.5)
WBC: 7.5 10*3/uL (ref 3.6–11.0)

## 2018-05-29 LAB — BAYER DCA HB A1C WAIVED: HB A1C: 5.8 % (ref <7.0)

## 2018-05-29 MED ORDER — DICLOFENAC SODIUM 1 % TD GEL: 4.0000 g | Freq: Four times a day (QID) | TRANSDERMAL | 12 refills | Status: DC

## 2018-05-29 MED ORDER — LIRAGLUTIDE -WEIGHT MANAGEMENT 18 MG/3ML ~~LOC~~ SOPN: PEN_INJECTOR | SUBCUTANEOUS | 4 refills | Status: DC

## 2018-05-29 NOTE — Assessment & Plan Note (Signed)
Will start voltaren as she cannot take oral NSAIDs

## 2018-05-29 NOTE — Assessment & Plan Note (Addendum)
#   Iron deficiency anemia-status post IV iron-hemoglobin improved.  Today hemoglobin 14.5.  Saturation 7%.  Hold off any IV iron.  Recommend p.o. iron every other day.  #Etiology of iron deficiency unclear-is post EGD colonoscopy negative for any reason.  Awaiting capsule study.  # NO iron today; in 4 months cbc/possible IV venofer.

## 2018-05-29 NOTE — Assessment & Plan Note (Signed)
Will start saxenda. Rx sent to her pharmacy. Call with any concerns. Recheck 1 month for tolerance.

## 2018-05-29 NOTE — Assessment & Plan Note (Signed)
Under good control. Continue current regimen. Continue to monitor. Call with any concerns. 

## 2018-05-29 NOTE — Progress Notes (Signed)
BP 133/81 (BP Location: Left Arm, Patient Position: Sitting, Cuff Size: Large)   Pulse 69   Temp 97.9 F (36.6 C)   Ht 5' 8.3" (1.735 m)   Wt 247 lb 4 oz (112.2 kg)   SpO2 99%   BMI 37.26 kg/m    Subjective:    Patient ID: [name], female    DOB: 12-Mar-1976, 42 y.o.   MRN: [id number]  HPI: [name] is a 42 y.o. female  Chief Complaint  Patient presents with  . Diabetes  . Knee Pain    Patient had to stop naproxen per Dr.Anna, she would like to know what she can take for pain now   . Obesity    Patient would like to discuss weight loss injection  . Other    Patient would like to make sure she is taking the correct vitamins   DIABETES Hypoglycemic episodes:no Polydipsia/polyuria: no Visual disturbance: no Chest pain: no Paresthesias: no Glucose Monitoring: no  Accucheck frequency: Not Checking Taking Insulin?: no Blood Pressure Monitoring: not checking Retinal Examination: Not up to Date Foot Exam: Up to Date Diabetic Education: Not Completed Pneumovax: Up to Date Influenza: Post-pone to flu season Aspirin: yes  HYPERTENSION Hypertension status: controlled  Satisfied with current treatment? yes Duration of hypertension: chronic BP monitoring frequency:  not checking BP medication side effects:  no Medication compliance: excellent compliance Previous BP meds: lisinopril Aspirin: no Recurrent headaches: no Visual changes: no Palpitations: no Dyspnea: no Chest pain: no Lower extremity edema: no Dizzy/lightheaded: no  ANEMIA Anemia status: better Etiology of anemia: iron def Duration of anemia treatment: chronic Compliance with treatment: excellent compliance Iron supplementation side effects: yes Severity of anemia: moderate Fatigue: yes Decreased exercise tolerance: no  Dyspnea on exertion: no Palpitations: no Bleeding: yes Pica: no  WEIGHT GAIN Duration: chronic Previous attempts at weight loss: yes, diet, exercise, low  carb diet Complications of obesity: DM, HTN Peak weight: 287 Weight loss goal: under 200, to be healthy Weight loss to date: 40lbs Requesting obesity pharmacotherapy: yes Current weight loss supplements/medications: no Previous weight loss supplements/meds: no  Relevant past medical, surgical, family and social history reviewed and updated as indicated. Interim medical history since our last visit reviewed. Allergies and medications reviewed and updated.  Review of Systems  Constitutional: Negative.   Respiratory: Negative.   Cardiovascular: Negative.   Gastrointestinal: Negative.   Musculoskeletal: Positive for arthralgias. Negative for back pain, gait problem, joint swelling, myalgias, neck pain and neck stiffness.  Skin: Negative.   Psychiatric/Behavioral: Negative.     Per HPI unless specifically indicated above     Objective:    BP 133/81 (BP Location: Left Arm, Patient Position: Sitting, Cuff Size: Large)   Pulse 69   Temp 97.9 F (36.6 C)   Ht 5' 8.3" (1.735 m)   Wt 247 lb 4 oz (112.2 kg)   SpO2 99%   BMI 37.26 kg/m   Wt Readings from Last 3 Encounters:  05/29/18 247 lb 4 oz (112.2 kg)  05/09/18 248 lb (112.5 kg)  04/16/18 243 lb (110.2 kg)    Physical Exam  Constitutional: She is oriented to person, place, and time. She appears well-developed and well-nourished. No distress.  HENT:  Head: Normocephalic and atraumatic.  Right Ear: Hearing normal.  Left Ear: Hearing normal.  Nose: Nose normal.  Eyes: Conjunctivae and lids are normal. Right eye exhibits no discharge. Left eye exhibits no discharge. No scleral icterus.  Cardiovascular: Normal rate, regular rhythm,  normal heart sounds and intact distal pulses. Exam reveals no gallop and no friction rub.  No murmur heard. Pulmonary/Chest: Effort normal and breath sounds normal. No stridor. No respiratory distress. She has no wheezes. She has no rales. She exhibits no tenderness.  Musculoskeletal: Normal range of  motion.  Neurological: She is alert and oriented to person, place, and time.  Skin: Skin is warm, [name] and intact. Capillary refill takes less than 2 seconds. No rash noted. She is not diaphoretic. No erythema. No pallor.  Psychiatric: She has a normal mood and affect. Her speech is normal and behavior is normal. Judgment and thought content normal. Cognition and memory are normal.  Nursing note and vitals reviewed.   Results for orders placed or performed in visit on 05/09/18  Rapid Strep Screen ([facility] ONLY)  Result Value Ref Range   Strep Gp A Ag, IA W/Reflex Negative Negative  Culture, Group A Strep  Result Value Ref Range   Strep A Culture Negative       Assessment & Plan:   Problem List Items Addressed This Visit      Digestive   NSAID-induced gastric ulcer    Cannot take oral NSAIDs- will start voltaren for her knee pain.        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 Continue current regimen. Continue to monitor. Call with any concerns.       Relevant Orders   Bayer DCA Hb A1c Waived   Comprehensive metabolic panel   Lipid Panel w/o Chol/HDL Ratio     Musculoskeletal and Integument   Disorder of patellofemoral joint    Will start voltaren as she cannot take oral NSAIDs        Genitourinary   Chronic kidney disease    Rechecking levels today. Await results. Call with any concerns.       Relevant Orders   Comprehensive metabolic panel   Benign hypertensive renal disease    Under good control. Continue current regimen. Continue to monitor. Call with any concerns.       Relevant Orders   Comprehensive metabolic panel     Other   Obesity    Will start saxenda. Rx sent to her pharmacy. Call with any concerns. Recheck 1 month for tolerance.      Relevant Medications   Liraglutide -Weight Management (SAXENDA) 18 MG/3ML SOPN   Iron deficiency anemia due  to chronic blood loss    Diagnosed with NSAID induced ulcers. Will stop oral NSAIDs. Rechecking levels today. Await results. Continue to follow with GI and hematology as needed.       Relevant Orders   Iron and TIBC   CBC with Differential/Platelet   Ferritin       Follow up plan: Return in about 4 weeks (around 06/26/2018) for follow up saxenda tolerance.

## 2018-05-29 NOTE — Assessment & Plan Note (Signed)
Rechecking levels today. Await results. Call with any concerns.  

## 2018-05-29 NOTE — Assessment & Plan Note (Signed)
Up slightly because she's off jardiance to 5.8. Continue current regimen. Continue to monitor. Call with any concerns.

## 2018-05-29 NOTE — Assessment & Plan Note (Signed)
Diagnosed with NSAID induced ulcers. Will stop oral NSAIDs. Rechecking levels today. Await results. Continue to follow with GI and hematology as needed.

## 2018-05-29 NOTE — Assessment & Plan Note (Signed)
Cannot take oral NSAIDs- will start voltaren for her knee pain.
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
## 2018-05-29 NOTE — Telephone Encounter (Signed)
PA submitted via cover my meds for voltaren gel, waiting for approval or denial.

## 2018-05-30 LAB — CBC WITH DIFFERENTIAL/PLATELET
BASOS ABS: 0 10*3/uL (ref 0.0–0.2)
Basos: 0 %
EOS (ABSOLUTE): 0.1 10*3/uL (ref 0.0–0.4)
EOS: 1 %
Hematocrit: 43.5 % (ref 34.0–46.6)
Hemoglobin: 14.1 g/dL (ref 11.1–15.9)
IMMATURE GRANS (ABS): 0 10*3/uL (ref 0.0–0.1)
IMMATURE GRANULOCYTES: 0 %
Lymphocytes Absolute: 2.7 10*3/uL (ref 0.7–3.1)
Lymphs: 37 %
MCH: 26.6 pg (ref 26.6–33.0)
MCHC: 32.4 g/dL (ref 31.5–35.7)
MCV: 82 fL (ref 79–97)
MONOCYTES: 6 %
MONOS ABS: 0.4 10*3/uL (ref 0.1–0.9)
Neutrophils Absolute: 4 10*3/uL (ref 1.4–7.0)
Neutrophils: 56 %
Platelets: 183 10*3/uL (ref 150–450)
RBC: 5.31 x10E6/uL — ABNORMAL HIGH (ref 3.77–5.28)
RDW: 18.3 % — AB (ref 12.3–15.4)
WBC: 7.3 10*3/uL (ref 3.4–10.8)

## 2018-05-30 LAB — FERRITIN: FERRITIN: 50 ng/mL (ref 15–150)

## 2018-05-30 LAB — LIPID PANEL W/O CHOL/HDL RATIO
Cholesterol, Total: 170 mg/dL (ref 100–199)
HDL: 37 mg/dL — AB (ref >39)
LDL CALC: 95 mg/dL (ref 0–99)
TRIGLYCERIDES: 188 mg/dL — AB (ref 0–149)
VLDL Cholesterol Cal: 38 mg/dL (ref 5–40)

## 2018-05-30 LAB — COMPREHENSIVE METABOLIC PANEL
ALT: 30 IU/L (ref 0–32)
AST: 24 IU/L (ref 0–40)
Albumin/Globulin Ratio: 2 (ref 1.2–2.2)
Albumin: 4.6 g/dL (ref 3.5–5.5)
Alkaline Phosphatase: 56 IU/L (ref 39–117)
BUN/Creatinine Ratio: 12 (ref 9–23)
BUN: 11 mg/dL (ref 6–24)
Bilirubin Total: 0.4 mg/dL (ref 0.0–1.2)
CALCIUM: 9.9 mg/dL (ref 8.7–10.2)
CO2: 20 mmol/L (ref 20–29)
Chloride: 103 mmol/L (ref 96–106)
Creatinine, Ser: 0.91 mg/dL (ref 0.57–1.00)
GFR, EST AFRICAN AMERICAN: 91 mL/min/{1.73_m2} (ref >59)
GFR, EST NON AFRICAN AMERICAN: 79 mL/min/{1.73_m2} (ref >59)
Globulin, Total: 2.3 g/dL (ref 1.5–4.5)
Glucose: 104 mg/dL — ABNORMAL HIGH (ref 65–99)
Potassium: 4.3 mmol/L (ref 3.5–5.2)
Sodium: 139 mmol/L (ref 134–144)
TOTAL PROTEIN: 6.9 g/dL (ref 6.0–8.5)

## 2018-05-30 LAB — IRON AND TIBC
Iron Saturation: 16 % (ref 15–55)
Iron: 51 ug/dL (ref 27–159)
TIBC: 318 ug/dL (ref 250–450)
UIBC: 267 ug/dL (ref 131–425)

## 2018-05-30 NOTE — Telephone Encounter (Signed)
PA for Saxenda was initiated via telephone. Approved. PA Case ID: [id number]

## 2018-06-13 ENCOUNTER — Telehealth: Payer: Self-pay | Admitting: Family Medicine

## 2018-06-13 NOTE — Telephone Encounter (Signed)
Copied from [facility] [id number]. Topic: Quick Communication - See Telephone Encounter >> Jun 13, 2018  3:27 PM Rutherford Nail, NT wrote: CRM for notification. See Telephone encounter for: 06/13/18. Patient calling and states that Dr Wynetta Emery is needing to send needles to the pharmacy for the [place], [place]

## 2018-06-14 MED ORDER — PEN NEEDLES 32G X 6 MM MISC: 1.0000 | Freq: Every day | 12 refills | Status: DC

## 2018-06-14 NOTE — Telephone Encounter (Signed)
Rx sent to her pharmacy 

## 2018-06-23 ENCOUNTER — Other Ambulatory Visit: Payer: Self-pay | Admitting: Family Medicine

## 2018-06-25 NOTE — Telephone Encounter (Signed)
Trazodone 100 mg refill Last Refill:05/24/18 # 30 Last OV: 07/06/18 PCP: Wynetta Emery Pharmacy:[facility] [id number], [place]  Returned because sleeping medications not covered under [facility] protocol.

## 2018-07-06 ENCOUNTER — Ambulatory Visit (INDEPENDENT_AMBULATORY_CARE_PROVIDER_SITE_OTHER): Payer: BC Managed Care – PPO | Admitting: Family Medicine

## 2018-07-06 ENCOUNTER — Encounter: Payer: Self-pay | Admitting: Family Medicine

## 2018-07-06 VITALS — BP 121/82 | HR 90 | Temp 98.6°F | Ht 68.3 in | Wt 249.4 lb

## 2018-07-06 DIAGNOSIS — R197 Diarrhea, unspecified: Secondary | ICD-10-CM | POA: Diagnosis not present

## 2018-07-06 DIAGNOSIS — Z23 Encounter for immunization: Secondary | ICD-10-CM | POA: Diagnosis not present

## 2018-07-06 DIAGNOSIS — Z6837 Body mass index (BMI) 37.0-37.9, adult: Secondary | ICD-10-CM | POA: Diagnosis not present

## 2018-07-06 NOTE — Progress Notes (Signed)
BP 121/82 (BP Location: Left Arm, Patient Position: Sitting, Cuff Size: Large)   Pulse 90   Temp 98.6 F (37 C)   Ht 5' 8.3" (1.735 m)   Wt 249 lb 6 oz (113.1 kg)   SpO2 97%   BMI 37.58 kg/m    Subjective:    Patient ID: [name], female    DOB: 1976/02/24, 42 y.o.   MRN: [id number]  HPI: [name] is a 42 y.o. female  Chief Complaint  Patient presents with  . weigt check  . Diarrhea   Here today for follow up on her weight after starting saxenda. She has not lost weight yet. She notes that she is not sleeping. She notes that she just saw her psychiatrist about 2 weeks ago. She is unsure when she stopped sleeping.   Had a GI bug last night. Had to leave work this AM. Slept most of the day today. Gut is doing better than when she had colonoscopy. Not bleeding any more. Had colonoscopy and EGD done with Dr. Vicente Males- both of which was negative. To have capsule endoscopy, however this has not been set up. Last time she saw hematology, she did not need an iron transfusion. She continues with diarrhea. She was supposed to follow up with GI, but has not.   Relevant past medical, surgical, family and social history reviewed and updated as indicated. Interim medical history since our last visit reviewed. Allergies and medications reviewed and updated.  Review of Systems  Constitutional: Negative.   Respiratory: Negative.   Cardiovascular: Negative.   Gastrointestinal: Positive for diarrhea, nausea and vomiting. Negative for abdominal distention, abdominal pain, anal bleeding, blood in stool, constipation and rectal pain.  Psychiatric/Behavioral: Negative.     Per HPI unless specifically indicated above     Objective:    BP 121/82 (BP Location: Left Arm, Patient Position: Sitting, Cuff Size: Large)   Pulse 90   Temp 98.6 F (37 C)   Ht 5' 8.3" (1.735 m)   Wt 249 lb 6 oz (113.1 kg)   SpO2 97%   BMI 37.58 kg/m   Wt Readings from Last 3 Encounters:  07/06/18  249 lb 6 oz (113.1 kg)  05/29/18 247 lb 4 oz (112.2 kg)  05/09/18 248 lb (112.5 kg)    Physical Exam  Constitutional: She is oriented to person, place, and time. She appears well-developed and well-nourished. No distress.  HENT:  Head: Normocephalic and atraumatic.  Right Ear: Hearing normal.  Left Ear: Hearing normal.  Nose: Nose normal.  Eyes: Conjunctivae and lids are normal. Right eye exhibits no discharge. Left eye exhibits no discharge. No scleral icterus.  Cardiovascular: Normal rate, regular rhythm, normal heart sounds and intact distal pulses. Exam reveals no gallop and no friction rub.  No murmur heard. Pulmonary/Chest: Effort normal and breath sounds normal. No stridor. No respiratory distress. She has no wheezes. She has no rales. She exhibits no tenderness.  Musculoskeletal: Normal range of motion.  Neurological: She is alert and oriented to person, place, and time.  Skin: Skin is warm, dry and intact. Capillary refill takes less than 2 seconds. No rash noted. She is not diaphoretic. No erythema. No pallor.  Psychiatric: She has a normal mood and affect. Her speech is normal and behavior is normal. Judgment and thought content normal. Cognition and memory are normal.  Nursing note and vitals reviewed.   Results for orders placed or performed in visit on 05/29/18  CBC with Differential  Result Value Ref Range   WBC 7.5 3.6 - 11.0 K/uL   RBC 5.56 (H) 3.80 - 5.20 MIL/uL   Hemoglobin 14.7 12.0 - 16.0 g/dL   HCT 44.6 35.0 - 47.0 %   MCV 80.1 80.0 - 100.0 fL   MCH 26.5 26.0 - 34.0 pg   MCHC 33.1 32.0 - 36.0 g/dL   RDW 19.3 (H) 11.5 - 14.5 %   Platelets 169 150 - 440 K/uL   Neutrophils Relative % 57 %   Neutro Abs 4.3 1.4 - 6.5 K/uL   Lymphocytes Relative 33 %   Lymphs Abs 2.5 1.0 - 3.6 K/uL   Monocytes Relative 8 %   Monocytes Absolute 0.6 0.2 - 0.9 K/uL   Eosinophils Relative 1 %   Eosinophils Absolute 0.1 0 - 0.7 K/uL   Basophils Relative 1 %   Basophils Absolute  0.0 0 - 0.1 K/uL      Assessment & Plan:   Problem List Items Addressed This Visit      Other   Obesity - Primary    Hasn't lost weight yet. Tolerating saxenda well. Will continue current regimen. Recheck 2 months.        Other Visit Diagnoses    Diarrhea, unspecified type       Likely due to GI bug- will give note for work. Symptomatic care.    Immunization due       Flu shot given today.   Relevant Orders   Flu Vaccine QUAD 6+ mos PF IM (Fluarix Quad PF) (Completed)       Follow up plan: Return in about 2 months (around 09/05/2018).

## 2018-07-06 NOTE — Assessment & Plan Note (Signed)
Hasn't lost weight yet. Tolerating saxenda well. Will continue current regimen. Recheck 2 months.

## 2018-07-24 ENCOUNTER — Other Ambulatory Visit: Payer: Self-pay | Admitting: Family Medicine

## 2018-07-31 ENCOUNTER — Other Ambulatory Visit: Payer: Self-pay | Admitting: Family Medicine

## 2018-08-01 NOTE — Telephone Encounter (Signed)
Requested Prescriptions  Pending Prescriptions Disp Refills  . metFORMIN (GLUCOPHAGE) 500 MG tablet [Pharmacy Med Name: METFORMIN 500MG TABLETS] 180 tablet 0    Sig: TAKE 1 TABLET(500 MG) BY MOUTH TWICE DAILY WITH A MEAL     Endocrinology:  Diabetes - Biguanides Passed - 07/31/2018  6:55 PM      Passed - Cr in normal range and within 360 days    Creatinine  Date Value Ref Range Status  04/09/2014 0.89 0.60 - 1.30 mg/dL Final   Creatinine, Ser  Date Value Ref Range Status  05/29/2018 0.91 0.57 - 1.00 mg/dL Final         Passed - HBA1C is between 0 and 7.9 and within 180 days    Hgb A1c MFr Bld  Date Value Ref Range Status  12/05/2016 6.1 (H) 4.8 - 5.6 % Final    Comment:             Pre-diabetes: 5.7 - 6.4          Diabetes: >6.4          Glycemic control for adults with diabetes: <7.0          Passed - eGFR in normal range and within 360 days    EGFR (African American)  Date Value Ref Range Status  04/09/2014 >60  Final   GFR calc Af Amer  Date Value Ref Range Status  05/29/2018 91 >59 mL/min/1.73 Final   EGFR (Non-African Amer.)  Date Value Ref Range Status  04/09/2014 >60  Final    Comment:    eGFR values <6m/min/1.73 m2 may be an indication of chronic kidney disease (CKD). Calculated eGFR is useful in patients with stable renal function. The eGFR calculation will not be reliable in acutely ill patients when serum creatinine is changing rapidly. It is not useful in  patients on dialysis. The eGFR calculation may not be applicable to patients at the low and high extremes of body sizes, pregnant women, and vegetarians.    GFR calc non Af Amer  Date Value Ref Range Status  05/29/2018 79 >59 mL/min/1.73 Final         Passed - Valid encounter within last 6 months    Recent Outpatient Visits          3 weeks ago Class 2 severe obesity due to excess calories with serious comorbidity and body mass index (BMI) of 37.0 to 37.9 in adult ([facility]   [facility]   2 months ago 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, without long-term current use of insulin ([place]   2 months ago Uvulitis   [facility] [place]  4 months ago 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, without long-term current use of insulin ([place]   [facility]   7 months ago Iron deficiency anemia, unspecified iron deficiency anemia type   [facility] [place] [facility] [facility]      Future Appointments            In 1 month [name], MBarb Merino DO [facility] [facility]

## 2018-09-07 ENCOUNTER — Encounter: Payer: Self-pay | Admitting: Family Medicine

## 2018-09-07 ENCOUNTER — Ambulatory Visit (INDEPENDENT_AMBULATORY_CARE_PROVIDER_SITE_OTHER): Payer: BC Managed Care – PPO | Admitting: Family Medicine

## 2018-09-07 ENCOUNTER — Other Ambulatory Visit: Payer: Self-pay

## 2018-09-07 VITALS — BP 118/80 | HR 71 | Temp 98.3°F | Ht 72.0 in | Wt 247.0 lb

## 2018-09-07 DIAGNOSIS — N760 Acute vaginitis: Secondary | ICD-10-CM

## 2018-09-07 DIAGNOSIS — N898 Other specified noninflammatory disorders of vagina: Secondary | ICD-10-CM

## 2018-09-07 DIAGNOSIS — E1122 Type 2 diabetes mellitus with diabetic chronic kidney disease: Secondary | ICD-10-CM | POA: Diagnosis not present

## 2018-09-07 DIAGNOSIS — B9689 Other specified bacterial agents as the cause of diseases classified elsewhere: Secondary | ICD-10-CM

## 2018-09-07 DIAGNOSIS — R300 Dysuria: Secondary | ICD-10-CM

## 2018-09-07 DIAGNOSIS — B3731 Acute candidiasis of vulva and vagina: Secondary | ICD-10-CM

## 2018-09-07 DIAGNOSIS — N181 Chronic kidney disease, stage 1: Secondary | ICD-10-CM

## 2018-09-07 DIAGNOSIS — B373 Candidiasis of vulva and vagina: Secondary | ICD-10-CM

## 2018-09-07 DIAGNOSIS — R8281 Pyuria: Secondary | ICD-10-CM

## 2018-09-07 LAB — BAYER DCA HB A1C WAIVED: HB A1C (BAYER DCA - WAIVED): 5.1 % (ref <7.0)

## 2018-09-07 MED ORDER — TRAZODONE HCL 100 MG PO TABS: ORAL_TABLET | ORAL | 1 refills | Status: DC

## 2018-09-07 MED ORDER — METRONIDAZOLE 500 MG PO TABS: 500.0000 mg | ORAL_TABLET | Freq: Two times a day (BID) | ORAL | 0 refills | Status: DC

## 2018-09-07 MED ORDER — FLUCONAZOLE 150 MG PO TABS: ORAL_TABLET | ORAL | 0 refills | Status: DC

## 2018-09-07 MED ORDER — PEN NEEDLES 32G X 6 MM MISC: 1.0000 | Freq: Every day | 12 refills | Status: DC

## 2018-09-07 MED ORDER — METFORMIN HCL 500 MG PO TABS: 500.0000 mg | ORAL_TABLET | Freq: Every day | ORAL | 1 refills | Status: DC

## 2018-09-07 NOTE — Progress Notes (Signed)
BP 118/80   Pulse 71   Temp 98.3 F (36.8 C) (Oral)   Ht 6' (1.829 m)   Wt 247 lb (112 kg)   SpO2 98%   BMI 33.50 kg/m    Subjective:    Patient ID: [name], female    DOB: 1975/11/22, 42 y.o.   MRN: [id number]  HPI: [name] is a 42 y.o. female  Chief Complaint  Patient presents with  . Diabetes    A1c  . Vaginal problems    pt states it has being itchy on and off for about a month   DIABETES Hypoglycemic episodes:no Polydipsia/polyuria: no Visual disturbance: no Chest pain: no Paresthesias: no Glucose Monitoring: no  Accucheck frequency: Not Checking Taking Insulin?: no Blood Pressure Monitoring: not checking Retinal Examination: Not up to Date Foot Exam: Up to Date Diabetic Education: Completed Pneumovax: Up to Date Influenza: Up to Date Aspirin: no  VAGINAL DISCHARGE Duration: months Discharge description: thin  Pruritus: yes Dysuria: yes Malodorous: yes Urinary frequency: no Fevers: no Abdominal pain: no  Recent antibiotic use: no Context: stable  Treatments attempted: none  Relevant past medical, surgical, family and social history reviewed and updated as indicated. Interim medical history since our last visit reviewed. Allergies and medications reviewed and updated.  Review of Systems  Constitutional: Negative.   Respiratory: Negative.   Cardiovascular: Negative.   Genitourinary: Positive for dysuria, frequency and urgency. Negative for decreased urine volume, difficulty urinating, dyspareunia, enuresis, flank pain, genital sores, hematuria, menstrual problem, pelvic pain, vaginal bleeding, vaginal discharge and vaginal pain.  Skin: Negative.   Neurological: Negative.   Psychiatric/Behavioral: Negative.     Per HPI unless specifically indicated above     Objective:    BP 118/80   Pulse 71   Temp 98.3 F (36.8 C) (Oral)   Ht 6' (1.829 m)   Wt 247 lb (112 kg)   SpO2 98%   BMI 33.50 kg/m   Wt Readings from  Last 3 Encounters:  09/07/18 247 lb (112 kg)  07/06/18 249 lb 6 oz (113.1 kg)  05/29/18 247 lb 4 oz (112.2 kg)    Physical Exam  Constitutional: She is oriented to person, place, and time. She appears well-developed and well-nourished. No distress.  HENT:  Head: Normocephalic and atraumatic.  Right Ear: Hearing normal.  Left Ear: Hearing normal.  Nose: Nose normal.  Eyes: Conjunctivae and lids are normal. Right eye exhibits no discharge. Left eye exhibits no discharge. No scleral icterus.  Cardiovascular: Normal rate, regular rhythm, normal heart sounds and intact distal pulses. Exam reveals no gallop and no friction rub.  No murmur heard. Pulmonary/Chest: Effort normal and breath sounds normal. No stridor. No respiratory distress. She has no wheezes. She has no rales. She exhibits no tenderness.  Musculoskeletal: Normal range of motion.  Neurological: She is alert and oriented to person, place, and time.  Skin: Skin is warm, dry and intact. Capillary refill takes less than 2 seconds. No rash noted. She is not diaphoretic. No erythema. No pallor.  Psychiatric: She has a normal mood and affect. Her speech is normal and behavior is normal. Judgment and thought content normal. Cognition and memory are normal.  Nursing note and vitals reviewed.   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 Continue to monitor. Call with any concerns. If still doing well in 3 months, will decrease medicine. Call with any concerns.       Relevant Medications   metFORMIN (GLUCOPHAGE) 500 MG tablet   Other Relevant Orders   Bayer DCA Hb A1c Waived (Completed)    Other Visit  Diagnoses    Dysuria       ?due to BV- will await culture. Call with any concerns.    Relevant Orders   UA/M w/rflx Culture, Routine   Vaginal itching       + yeast and BV. WIll treat.    Relevant Orders   WET PREP FOR [place], YEAST, CLUE   BV (bacterial vaginosis)       Will treat with flagyl. Call with any concerns.    Relevant Medications   metroNIDAZOLE (FLAGYL) 500 MG tablet   fluconazole (DIFLUCAN) 150 MG tablet   Vaginal yeast infection       Will treat with diflucan. Call with any concerns.    Relevant Medications   metroNIDAZOLE (FLAGYL) 500 MG tablet   fluconazole (DIFLUCAN) 150 MG tablet   Pyuria       ?due to BV- will await culture. Call with any concerns.    Relevant Orders   Urine Culture       Follow up plan: Return in about 3 months (around 12/08/2018).

## 2018-09-07 NOTE — Assessment & Plan Note (Signed)
Under good control with A1c of 5.1- continue current regimen. Continue to monitor. Call with any concerns. If still doing well in 3 months, will decrease medicine. Call with any concerns.

## 2018-09-08 LAB — WET PREP FOR TRICH, YEAST, CLUE
CLUE CELL EXAM: POSITIVE — AB
Trichomonas Exam: NEGATIVE
Yeast Exam: POSITIVE — AB

## 2018-09-09 LAB — URINE CULTURE

## 2018-09-10 ENCOUNTER — Encounter: Payer: Self-pay | Admitting: Family Medicine

## 2018-09-10 LAB — URINE CULTURE, REFLEX

## 2018-09-11 LAB — UA/M W/RFLX CULTURE, ROUTINE
BILIRUBIN UA: NEGATIVE
GLUCOSE, UA: NEGATIVE
Ketones, UA: NEGATIVE
Nitrite, UA: NEGATIVE
PH UA: 6 (ref 5.0–7.5)
Protein, UA: NEGATIVE
RBC, UA: NEGATIVE
SPEC GRAV UA: 1.015 (ref 1.005–1.030)
UUROB: 0.2 mg/dL (ref 0.2–1.0)

## 2018-09-11 LAB — MICROSCOPIC EXAMINATION: RBC, UA: NONE SEEN /hpf (ref 0–2)

## 2018-09-22 ENCOUNTER — Other Ambulatory Visit: Payer: Self-pay | Admitting: Family Medicine

## 2018-09-24 NOTE — Telephone Encounter (Signed)
Requested medication (s) are due for refill today: yes  Requested medication (s) are on the active medication list: yes  Last refill:  07/24/18  Future visit scheduled: yes  Notes to clinic:  Medication for sleep   Requested Prescriptions  Pending Prescriptions Disp Refills   traZODone (DESYREL) 100 MG tablet [Pharmacy Med Name: TRAZODONE 100MG  TABLETS] 30 tablet 0    Sig: TAKE 1 TABLET(100 MG) BY MOUTH AT BEDTIME AS NEEDED FOR SLEEP     Psychiatry: Antidepressants - Serotonin Modulator Passed - 09/22/2018  3:16 AM      Passed - Valid encounter within last 6 months    Recent Outpatient Visits          2 weeks ago 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, without long-term current use of insulin ([place])   [place], Megan P, DO   2 months ago Class 2 severe obesity due to excess calories with serious comorbidity and body mass index (BMI) of 37.0 to 37.9 in adult [facility])   [place], Megan P, DO   3 months ago 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, without long-term current use of insulin ([place])   [place], [place], DO   4 months ago Uvulitis   [facility] [place] [place]   6 months ago 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, without long-term current use of insulin [facility])   [place], [place], DO      Future Appointments            In 2 months Wynetta Emery, Barb Merino, DO [employer], [facility]

## 2018-10-02 ENCOUNTER — Other Ambulatory Visit: Payer: BC Managed Care – PPO

## 2018-10-02 ENCOUNTER — Ambulatory Visit: Payer: BC Managed Care – PPO | Admitting: Internal Medicine

## 2018-10-02 ENCOUNTER — Ambulatory Visit: Payer: BC Managed Care – PPO

## 2018-12-06 IMAGING — CR DG KNEE COMPLETE 4+V*L*
4 series · 4 of 4 positions shown · non-contrast
Comparison: 11/10/2014

CLINICAL DATA: Fall from ladder

EXAM:
LEFT KNEE - COMPLETE 4+ VIEW

[knee ap]
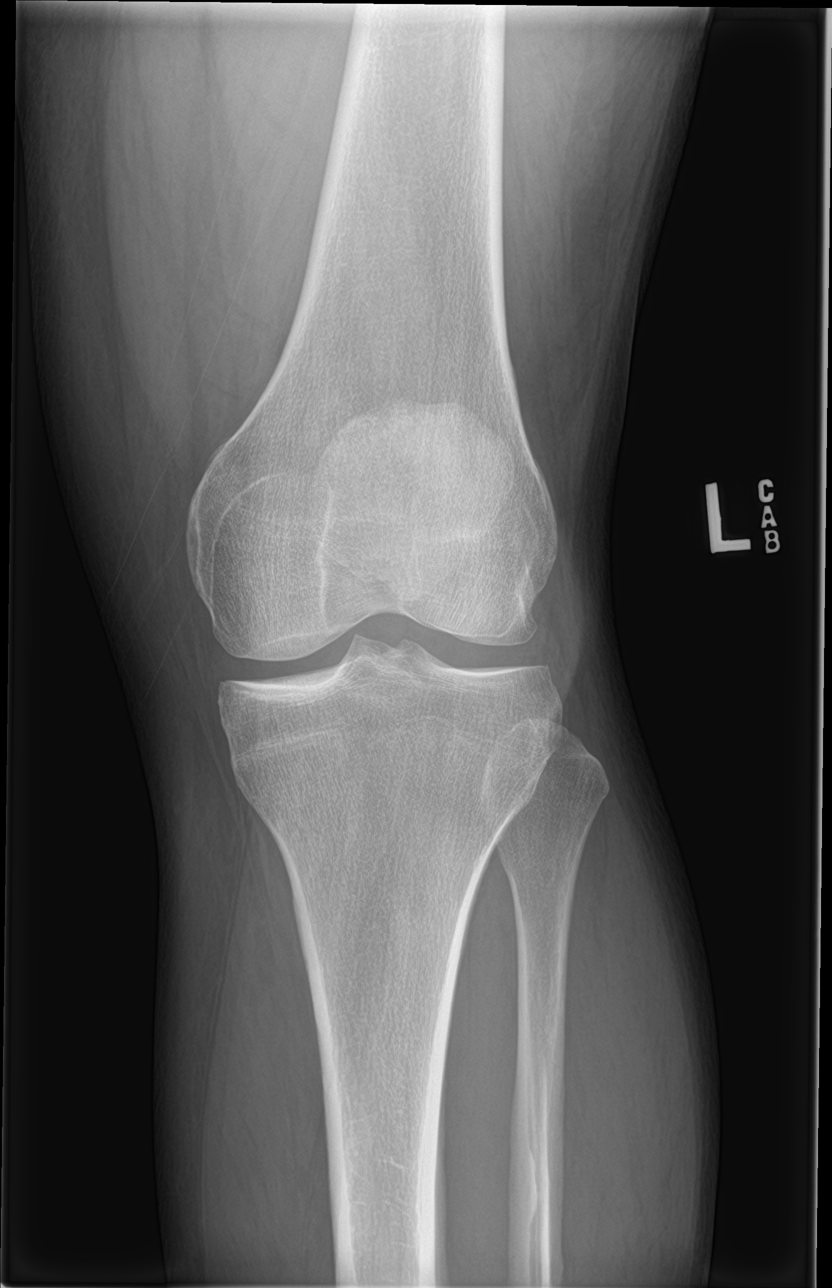

[knee obl (1 of 2)]
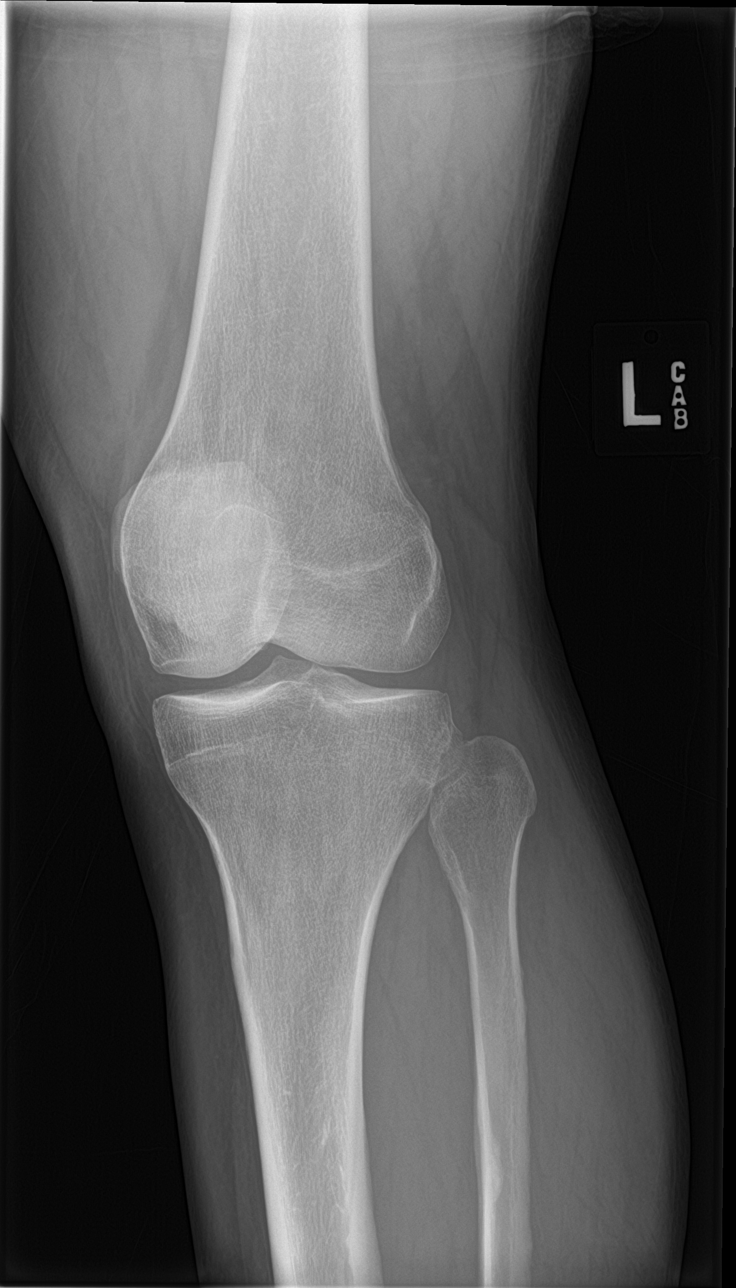

[knee obl (2 of 2)]
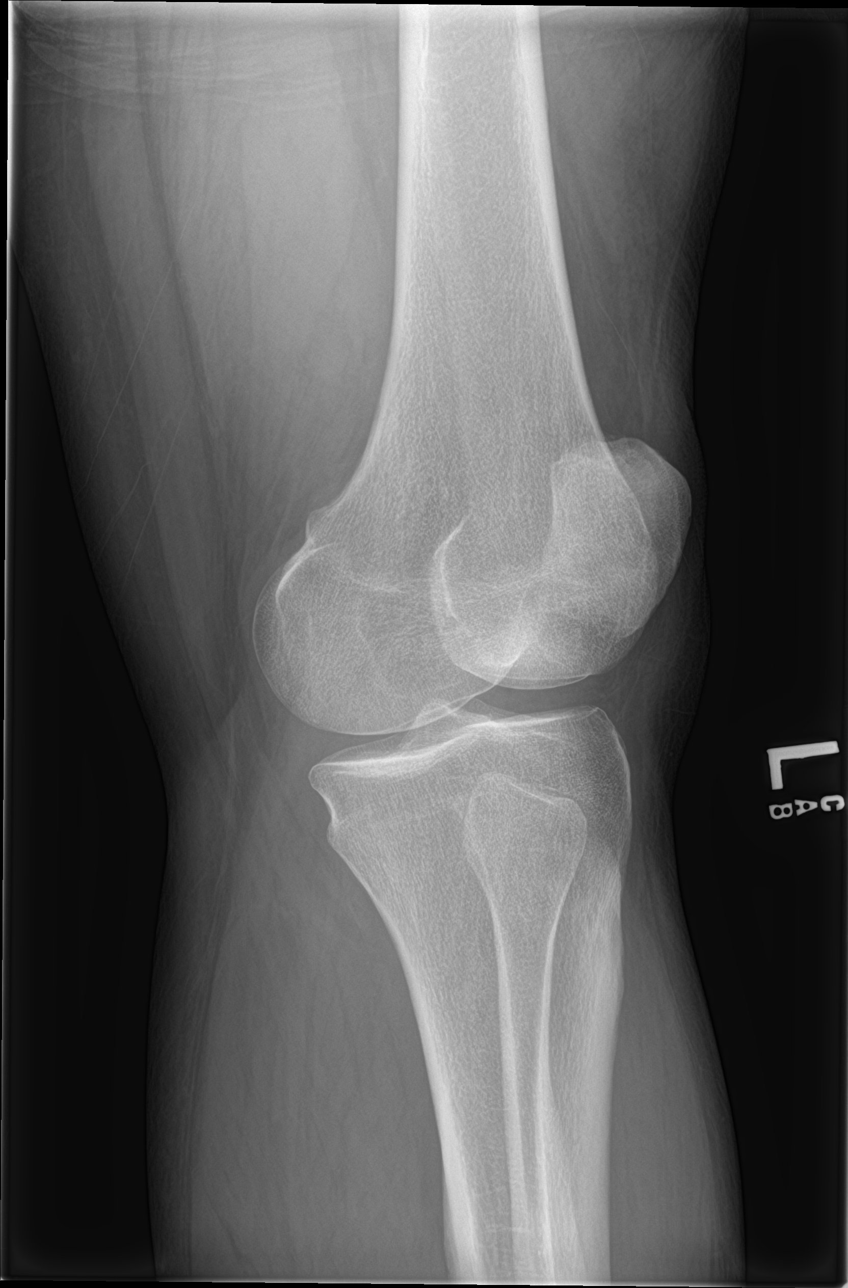

[knee lat]
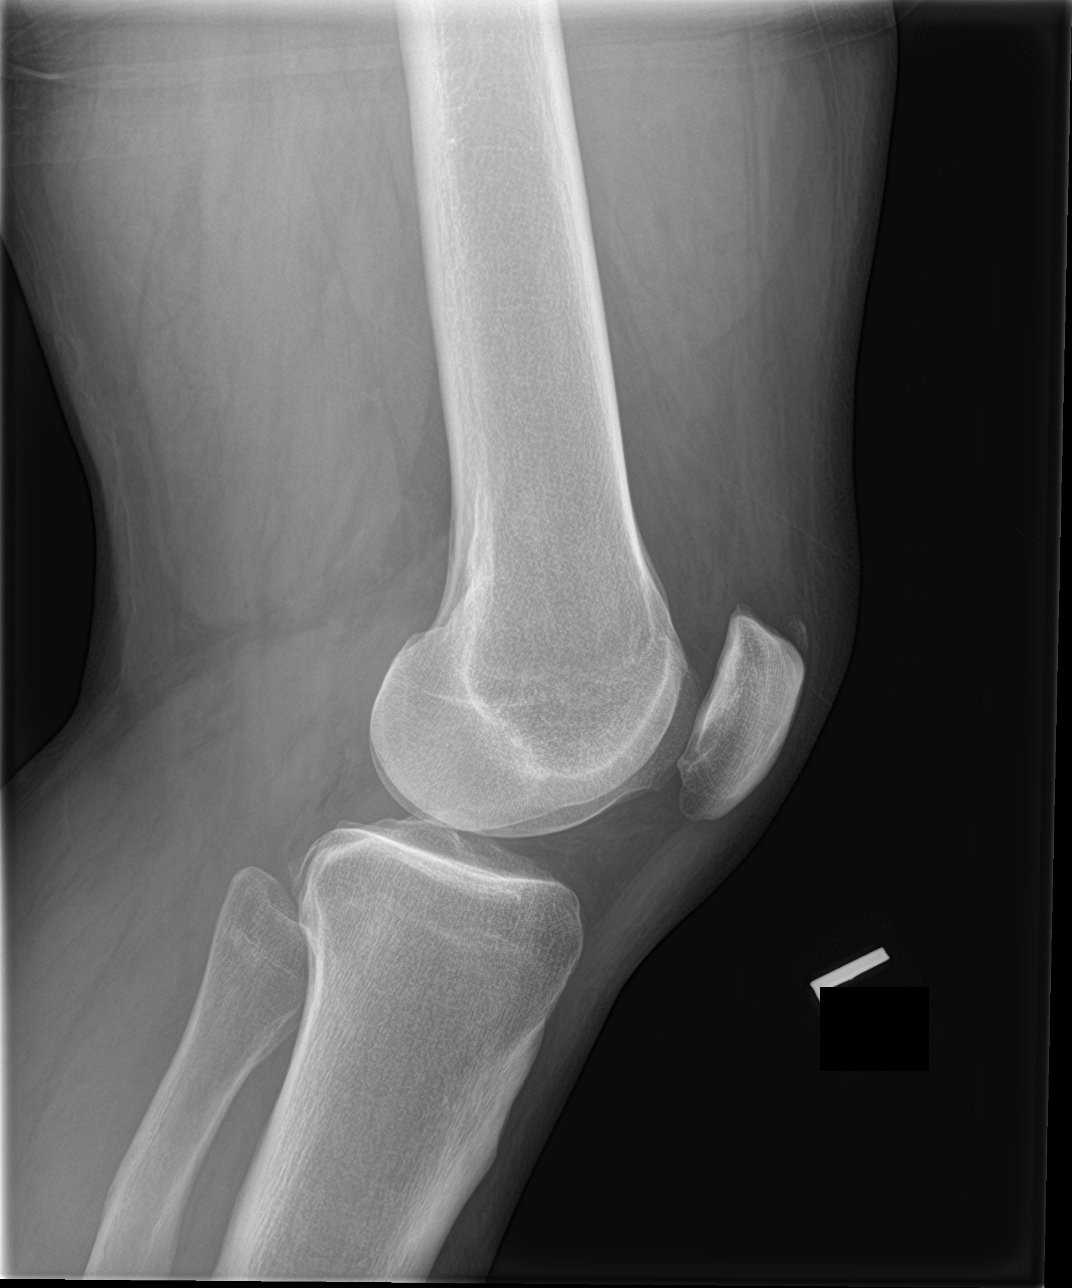

[4 of 4 positions shown; findings below may reference images not displayed]

FINDINGS: No acute fracture. No dislocation. Chronic bony density superior to
the patella is noted. Unremarkable soft tissues.
IMPRESSION: No acute bony pathology.

## 2018-12-09 ENCOUNTER — Other Ambulatory Visit: Payer: Self-pay | Admitting: Family Medicine

## 2018-12-10 NOTE — Telephone Encounter (Signed)
Requested Prescriptions  Pending Prescriptions Disp Refills  . pantoprazole (PROTONIX) 40 MG tablet [Pharmacy Med Name: PANTOPRAZOLE 40MG  TABLETS] 180 tablet 0    Sig: TAKE 1 TABLET(40 MG) BY MOUTH TWICE DAILY     Gastroenterology: Proton Pump Inhibitors Passed - 12/09/2018 10:04 AM      Passed - Valid encounter within last 12 months    Recent Outpatient Visits          3 months ago 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, without long-term current use of insulin ([place])   [facility] [place], Megan P, DO   5 months ago Class 2 severe obesity due to excess calories with serious comorbidity and body mass index (BMI) of 37.0 to 37.9 in adult [facility])   [facility], Megan P, DO   6 months ago 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, without long-term current use of insulin ([place])   [place], [place], DO   7 months ago [place], [place], [place]   9 months ago 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, without long-term current use of insulin [facility])   [place], [place], DO      Future Appointments            In 3 days Wynetta Emery, Barb Merino, DO [employer], [facility]

## 2018-12-13 ENCOUNTER — Ambulatory Visit: Payer: BC Managed Care – PPO | Admitting: Family Medicine

## 2018-12-13 ENCOUNTER — Ambulatory Visit (INDEPENDENT_AMBULATORY_CARE_PROVIDER_SITE_OTHER): Payer: BC Managed Care – PPO | Admitting: Family Medicine

## 2018-12-13 ENCOUNTER — Encounter: Payer: Self-pay | Admitting: Family Medicine

## 2018-12-13 ENCOUNTER — Other Ambulatory Visit: Payer: Self-pay

## 2018-12-13 ENCOUNTER — Other Ambulatory Visit (HOSPITAL_COMMUNITY)
Admission: RE | Admit: 2018-12-13 | Discharge: 2018-12-13 | Disposition: A | Discharge status: Home / Self Care | Payer: BC Managed Care – PPO | Source: Ambulatory Visit | Attending: Family Medicine | Admitting: Family Medicine

## 2018-12-13 VITALS — BP 138/81 | HR 81 | Temp 98.2°F | Ht 72.0 in | Wt 252.4 lb

## 2018-12-13 DIAGNOSIS — I129 Hypertensive chronic kidney disease with stage 1 through stage 4 chronic kidney disease, or unspecified chronic kidney disease: Secondary | ICD-10-CM | POA: Diagnosis not present

## 2018-12-13 DIAGNOSIS — N181 Chronic kidney disease, stage 1: Secondary | ICD-10-CM | POA: Diagnosis not present

## 2018-12-13 DIAGNOSIS — I2510 Atherosclerotic heart disease of native coronary artery without angina pectoris: Secondary | ICD-10-CM

## 2018-12-13 DIAGNOSIS — F3170 Bipolar disorder, currently in remission, most recent episode unspecified: Secondary | ICD-10-CM | POA: Diagnosis not present

## 2018-12-13 DIAGNOSIS — Z0000 Encounter for general adult medical examination without abnormal findings: Secondary | ICD-10-CM | POA: Diagnosis not present

## 2018-12-13 DIAGNOSIS — N926 Irregular menstruation, unspecified: Secondary | ICD-10-CM

## 2018-12-13 DIAGNOSIS — D500 Iron deficiency anemia secondary to blood loss (chronic): Secondary | ICD-10-CM

## 2018-12-13 DIAGNOSIS — Z124 Encounter for screening for malignant neoplasm of cervix: Secondary | ICD-10-CM | POA: Insufficient documentation

## 2018-12-13 DIAGNOSIS — M542 Cervicalgia: Secondary | ICD-10-CM

## 2018-12-13 DIAGNOSIS — E1122 Type 2 diabetes mellitus with diabetic chronic kidney disease: Secondary | ICD-10-CM | POA: Diagnosis not present

## 2018-12-13 MED ORDER — CETIRIZINE HCL 10 MG PO TABS: 10.0000 mg | ORAL_TABLET | Freq: Every day | ORAL | 11 refills | Status: DC

## 2018-12-13 MED ORDER — METFORMIN HCL 500 MG PO TABS: 500.0000 mg | ORAL_TABLET | Freq: Every day | ORAL | 1 refills | Status: DC

## 2018-12-13 MED ORDER — TRAZODONE HCL 100 MG PO TABS: ORAL_TABLET | ORAL | 6 refills | Status: DC

## 2018-12-13 MED ORDER — LISINOPRIL 5 MG PO TABS: ORAL_TABLET | ORAL | 1 refills | Status: DC

## 2018-12-13 MED ORDER — FERROUS SULFATE 325 (65 FE) MG PO TABS: 325.0000 mg | ORAL_TABLET | Freq: Three times a day (TID) | ORAL | 6 refills | Status: DC

## 2018-12-13 MED ORDER — CYCLOBENZAPRINE HCL 10 MG PO TABS: 10.0000 mg | ORAL_TABLET | Freq: Every day | ORAL | 2 refills | Status: DC

## 2018-12-13 MED ORDER — PANTOPRAZOLE SODIUM 40 MG PO TBEC: DELAYED_RELEASE_TABLET | ORAL | 1 refills | Status: DC
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
## 2018-12-13 NOTE — Assessment & Plan Note (Signed)
Rechecking levels today. Continue to follow with hematology. Call with any concerns.

## 2018-12-13 NOTE — Patient Instructions (Addendum)
Call to make your mammogram: [facility] at [facility]  Address: [address], [place], [place] [address]  Phone: [phone]  Health Maintenance, Female Adopting a healthy lifestyle and getting preventive care can go a long way to promote health and wellness. Talk with your health care provider about what schedule of regular examinations is right for you. This is a good chance for you to check in with your provider about disease prevention and staying healthy. In between checkups, there are plenty of things you can do on your own. Experts have done a lot of research about which lifestyle changes and preventive measures are most likely to keep you healthy. Ask your health care provider for more information. Weight and diet Eat a healthy diet  Be sure to include plenty of vegetables, fruits, low-fat dairy products, and lean protein.  Do not eat a lot of foods high in solid fats, added sugars, or salt.  Get regular exercise. This is one of the most important things you can do for your health. ? Most adults should exercise for at least 150 minutes each week. The exercise should increase your heart rate and make you sweat (moderate-intensity exercise). ? Most adults should also do strengthening exercises at least twice a week. This is in addition to the moderate-intensity exercise. Maintain a healthy weight  Body mass index (BMI) is a measurement that can be used to identify possible weight problems. It estimates body fat based on height and weight. Your health care provider can help determine your BMI and help you achieve or maintain a healthy weight.  For females 30 years of age and older: ? A BMI below 18.5 is considered underweight. ? A BMI of 18.5 to 24.9 is normal. ? A BMI of 25 to 29.9 is considered overweight. ? A BMI of 30 and above is considered obese. Watch levels of cholesterol and blood lipids  You should start having your blood tested for lipids and  cholesterol at 42 years of age, then have this test every 5 years.  You may need to have your cholesterol levels checked more often if: ? Your lipid or cholesterol levels are high. ? You are older than 42 years of age. ? You are at high risk for heart disease. Cancer screening Lung Cancer  Lung cancer screening is recommended for adults 66-86 years old who are at high risk for lung cancer because of a history of smoking.  A yearly low-dose CT scan of the lungs is recommended for people who: ? Currently smoke. ? Have quit within the past 15 years. ? Have at least a 30-pack-year history of smoking. A pack year is smoking an average of one pack of cigarettes a day for 1 year.  Yearly screening should continue until it has been 15 years since you quit.  Yearly screening should stop if you develop a health problem that would prevent you from having lung cancer treatment. Breast Cancer  Practice breast self-awareness. This means understanding how your breasts normally appear and feel.  It also means doing regular breast self-exams. Let your health care provider know about any changes, no matter how small.  If you are in your 20s or 30s, you should have a clinical breast exam (CBE) by a health care provider every 1-3 years as part of a regular health exam.  If you are 26 or older, have a CBE every year. Also consider having a breast X-ray (mammogram) every year.  If you have  a family history of breast cancer, talk to your health care provider about genetic screening.  If you are at high risk for breast cancer, talk to your health care provider about having an MRI and a mammogram every year.  Breast cancer gene (BRCA) assessment is recommended for women who have family members with BRCA-related cancers. BRCA-related cancers include: ? Breast. ? Ovarian. ? Tubal. ? Peritoneal cancers.  Results of the assessment will determine the need for genetic counseling and BRCA1 and BRCA2  testing. Cervical Cancer Your health care provider may recommend that you be screened regularly for cancer of the pelvic organs (ovaries, uterus, and vagina). This screening involves a pelvic examination, including checking for microscopic changes to the surface of your cervix (Pap test). You may be encouraged to have this screening done every 3 years, beginning at age 59.  For women ages 56-65, health care providers may recommend pelvic exams and Pap testing every 3 years, or they may recommend the Pap and pelvic exam, combined with testing for human papilloma virus (HPV), every 5 years. Some types of HPV increase your risk of cervical cancer. Testing for HPV may also be done on women of any age with unclear Pap test results.  Other health care providers may not recommend any screening for nonpregnant women who are considered low risk for pelvic cancer and who do not have symptoms. Ask your health care provider if a screening pelvic exam is right for you.  If you have had past treatment for cervical cancer or a condition that could lead to cancer, you need Pap tests and screening for cancer for at least 20 years after your treatment. If Pap tests have been discontinued, your risk factors (such as having a new sexual partner) need to be reassessed to determine if screening should resume. Some women have medical problems that increase the chance of getting cervical cancer. In these cases, your health care provider may recommend more frequent screening and Pap tests. Colorectal Cancer  This type of cancer can be detected and often prevented.  Routine colorectal cancer screening usually begins at 42 years of age and continues through 42 years of age.  Your health care provider may recommend screening at an earlier age if you have risk factors for colon cancer.  Your health care provider may also recommend using home test kits to check for hidden blood in the stool.  A small camera at the end of a  tube can be used to examine your colon directly (sigmoidoscopy or colonoscopy). This is done to check for the earliest forms of colorectal cancer.  Routine screening usually begins at age 18.  Direct examination of the colon should be repeated every 5-10 years through 42 years of age. However, you may need to be screened more often if early forms of precancerous polyps or small growths are found. Skin Cancer  Check your skin from head to toe regularly.  Tell your health care provider about any new moles or changes in moles, especially if there is a change in a mole's shape or color.  Also tell your health care provider if you have a mole that is larger than the size of a pencil eraser.  Always use sunscreen. Apply sunscreen liberally and repeatedly throughout the day.  Protect yourself by wearing long sleeves, pants, a wide-brimmed hat, and sunglasses whenever you are outside. Heart disease, diabetes, and high blood pressure  High blood pressure causes heart disease and increases the risk of stroke. High blood  pressure is more likely to develop in: ? People who have blood pressure in the high end of the normal range (130-139/85-89 mm Hg). ? People who are overweight or obese. ? People who are African American.  If you are 38-52 years of age, have your blood pressure checked every 3-5 years. If you are 22 years of age or older, have your blood pressure checked every year. You should have your blood pressure measured twice-once when you are at a hospital or clinic, and once when you are not at a hospital or clinic. Record the average of the two measurements. To check your blood pressure when you are not at a hospital or clinic, you can use: ? An automated blood pressure machine at a pharmacy. ? A home blood pressure monitor.  If you are between 69 years and 83 years old, ask your health care provider if you should take aspirin to prevent strokes.  Have regular diabetes screenings. This  involves taking a blood sample to check your fasting blood sugar level. ? If you are at a normal weight and have a low risk for diabetes, have this test once every three years after 42 years of age. ? If you are overweight and have a high risk for diabetes, consider being tested at a younger age or more often. Preventing infection Hepatitis B  If you have a higher risk for hepatitis B, you should be screened for this virus. You are considered at high risk for hepatitis B if: ? You were born in a country where hepatitis B is common. Ask your health care provider which countries are considered high risk. ? Your parents were born in a high-risk country, and you have not been immunized against hepatitis B (hepatitis B vaccine). ? You have HIV or AIDS. ? You use needles to inject street drugs. ? You live with someone who has hepatitis B. ? You have had sex with someone who has hepatitis B. ? You get hemodialysis treatment. ? You take certain medicines for conditions, including cancer, organ transplantation, and autoimmune conditions. Hepatitis C  Blood testing is recommended for: ? Everyone born from [birth year] through 1964. ? Anyone with known risk factors for hepatitis C. Sexually transmitted infections (STIs)  You should be screened for sexually transmitted infections (STIs) including gonorrhea and chlamydia if: ? You are sexually active and are younger than 42 years of age. ? You are older than 42 years of age and your health care provider tells you that you are at risk for this type of infection. ? Your sexual activity has changed since you were last screened and you are at an increased risk for chlamydia or gonorrhea. Ask your health care provider if you are at risk.  If you do not have HIV, but are at risk, it may be recommended that you take a prescription medicine daily to prevent HIV infection. This is called pre-exposure prophylaxis (PrEP). You are considered at risk if: ? You are  sexually active and do not regularly use condoms or know the HIV status of your partner(s). ? You take drugs by injection. ? You are sexually active with a partner who has HIV. Talk with your health care provider about whether you are at high risk of being infected with HIV. If you choose to begin PrEP, you should first be tested for HIV. You should then be tested every 3 months for as long as you are taking PrEP. Pregnancy  If you are premenopausal and you may  become pregnant, ask your health care provider about preconception counseling.  If you may become pregnant, take 400 to 800 micrograms (mcg) of folic acid every day.  If you want to prevent pregnancy, talk to your health care provider about birth control (contraception). Osteoporosis and menopause  Osteoporosis is a disease in which the bones lose minerals and strength with aging. This can result in serious bone fractures. Your risk for osteoporosis can be identified using a bone density scan.  If you are 96 years of age or older, or if you are at risk for osteoporosis and fractures, ask your health care provider if you should be screened.  Ask your health care provider whether you should take a calcium or vitamin D supplement to lower your risk for osteoporosis.  Menopause may have certain physical symptoms and risks.  Hormone replacement therapy may reduce some of these symptoms and risks. Talk to your health care provider about whether hormone replacement therapy is right for you. Follow these instructions at home:  Schedule regular health, dental, and eye exams.  Stay current with your immunizations.  Do not use any tobacco products including cigarettes, chewing tobacco, or electronic cigarettes.  If you are pregnant, do not drink alcohol.  If you are breastfeeding, limit how much and how often you drink alcohol.  Limit alcohol intake to no more than 1 drink per day for nonpregnant women. One drink equals 12 ounces of  beer, 5 ounces of wine, or 1 ounces of hard liquor.  Do not use street drugs.  Do not share needles.  Ask your health care provider for help if you need support or information about quitting drugs.  Tell your health care provider if you often feel depressed.  Tell your health care provider if you have ever been abused or do not feel safe at home. This information is not intended to replace advice given to you by your health care provider. Make sure you discuss any questions you have with your health care provider. Document Released: 04/18/2011 Document Revised: 03/10/2016 Document Reviewed: 07/07/2015 Elsevier Interactive Patient Education  2019 [employer]  Cervical Strain and Sprain Rehab Ask your health care provider which exercises are safe for you. Do exercises exactly as told by your health care provider and adjust them as directed. It is normal to feel mild stretching, pulling, tightness, or discomfort as you do these exercises, but you should stop right away if you feel sudden pain or your pain gets worse.Do not begin these exercises until told by your health care provider. Stretching and range of motion exercises These exercises warm up your muscles and joints and improve the movement and flexibility of your neck. These exercises also help to relieve pain, numbness, and tingling. Exercise A: Cervical side bend  1. Using good posture, sit on a stable chair or stand up. 2. Without moving your shoulders, slowly tilt your left / right ear to your shoulder until you feel a stretch in your neck muscles. You should be looking straight ahead. 3. Hold for __________ seconds. 4. Repeat with the other side of your neck. Repeat __________ times. Complete this exercise __________ times a day. Exercise B: Cervical rotation  1. Using good posture, sit on a stable chair or stand up. 2. Slowly turn your head to the side as if you are looking over your left / right shoulder. ? Keep your  eyes level with the ground. ? Stop when you feel a stretch along the side and the back  of your neck. 3. Hold for __________ seconds. 4. Repeat this by turning to your other side. Repeat __________ times. Complete this exercise __________ times a day. Exercise C: Thoracic extension and pectoral stretch 1. Roll a towel or a small blanket so it is about 4 inches (10 cm) in diameter. 2. Lie down on your back on a firm surface. 3. Put the towel lengthwise, under your spine in the middle of your back. It should not be not under your shoulder blades. The towel should line up with your spine from your middle back to your lower back. 4. Put your hands behind your head and let your elbows fall out to your sides. 5. Hold for __________ seconds. Repeat __________ times. Complete this exercise __________ times a day. Strengthening exercises These exercises build strength and endurance in your neck. Endurance is the ability to use your muscles for a long time, even after your muscles get tired. Exercise D: Upper cervical flexion, isometric 1. Lie on your back with a thin pillow behind your head and a small rolled-up towel under your neck. 2. Gently tuck your chin toward your chest and nod your head down to look toward your feet. Do not lift your head off the pillow. 3. Hold for __________ seconds. 4. Release the tension slowly. Relax your neck muscles completely before you repeat this exercise. Repeat __________ times. Complete this exercise __________ times a day. Exercise E: Cervical extension, isometric  1. Stand about 6 inches (15 cm) away from a wall, with your back facing the wall. 2. Place a soft object, about 6-8 inches (15-20 cm) in diameter, between the back of your head and the wall. A soft object could be a small pillow, a ball, or a folded towel. 3. Gently tilt your head back and press into the soft object. Keep your jaw and forehead relaxed. 4. Hold for __________ seconds. 5. Release the  tension slowly. Relax your neck muscles completely before you repeat this exercise. Repeat __________ times. Complete this exercise __________ times a day. Posture and body mechanics Body mechanics refers to the movements and positions of your body while you do your daily activities. Posture is part of body mechanics. Good posture and healthy body mechanics can help to relieve stress in your body's tissues and joints. Good posture means that your spine is in its natural S-curve position (your spine is neutral), your shoulders are pulled back slightly, and your head is not tipped forward. The following are general guidelines for applying improved posture and body mechanics to your everyday activities. Standing   When standing, keep your spine neutral and keep your feet about hip-width apart. Keep a slight bend in your knees. Your ears, shoulders, and hips should line up.  When you do a task in which you stand in one place for a long time, place one foot up on a stable object that is 2-4 inches (5-10 cm) high, such as a footstool. This helps keep your spine neutral. Sitting   When sitting, keep your spine neutral and your keep feet flat on the floor. Use a footrest, if necessary, and keep your thighs parallel to the floor. Avoid rounding your shoulders, and avoid tilting your head forward.  When working at a desk or a computer, keep your desk at a height where your hands are slightly lower than your elbows. Slide your chair under your desk so you are close enough to maintain good posture.  When working at a computer, place your monitor  at a height where you are looking straight ahead and you do not have to tilt your head forward or downward to look at the screen. Resting When lying down and resting, avoid positions that are most painful for you. Try to support your neck in a neutral position. You can use a contour pillow or a small rolled-up towel. Your pillow should support your neck but not push  on it. This information is not intended to replace advice given to you by your health care provider. Make sure you discuss any questions you have with your health care provider. Document Released: 10/03/2005 Document Revised: 06/09/2016 Document Reviewed: 09/09/2015 Elsevier Interactive Patient Education  2019 [employer].

## 2018-12-13 NOTE — Assessment & Plan Note (Signed)
Will keep sugars, BP and cholesterol under good control. Continue to follow with cardiology. Call with any concerns.

## 2018-12-13 NOTE — Assessment & Plan Note (Signed)
Under good control with A1c of 6.1. Continue current regimen. Continue to monitor. Call with any concerns.  

## 2018-12-13 NOTE — Assessment & Plan Note (Signed)
Under good control on current regimen. Continue current regimen. Continue to monitor. Call with any concerns. Refills given. Labs checked today.  

## 2018-12-13 NOTE — Assessment & Plan Note (Signed)
Rechecking levels today. Await results. Call with any concerns.  

## 2018-12-13 NOTE — Assessment & Plan Note (Signed)
Not doing great. Follows with psychiatry. Call with any concerns. Continue to monitor.

## 2018-12-14 ENCOUNTER — Ambulatory Visit
Admission: RE | Admit: 2018-12-14 | Discharge: 2018-12-14 | Disposition: A | Discharge status: Home / Self Care | Payer: BC Managed Care – PPO | Source: Ambulatory Visit | Attending: Family Medicine | Admitting: Family Medicine

## 2018-12-14 ENCOUNTER — Other Ambulatory Visit: Payer: BC Managed Care – PPO

## 2018-12-14 DIAGNOSIS — D500 Iron deficiency anemia secondary to blood loss (chronic): Secondary | ICD-10-CM

## 2018-12-14 DIAGNOSIS — M542 Cervicalgia: Secondary | ICD-10-CM | POA: Insufficient documentation

## 2018-12-14 LAB — UA/M W/RFLX CULTURE, ROUTINE
Bilirubin, UA: NEGATIVE
Glucose, UA: NEGATIVE
Ketones, UA: NEGATIVE
LEUKOCYTES UA: NEGATIVE
Nitrite, UA: NEGATIVE
Protein, UA: NEGATIVE
Specific Gravity, UA: <1.005 — ABNORMAL LOW (ref 1.005–1.030)
Urobilinogen, Ur: 0.2 mg/dL (ref 0.2–1.0)
pH, UA: 6 (ref 5.0–7.5)

## 2018-12-14 LAB — MICROALBUMIN, URINE WAIVED
Creatinine, Urine Waived: 50 mg/dL (ref 10–300)
Microalb, Ur Waived: 10 mg/L (ref 0–19)

## 2018-12-14 LAB — MICROSCOPIC EXAMINATION

## 2018-12-14 LAB — BAYER DCA HB A1C WAIVED: HB A1C (BAYER DCA - WAIVED): 6.1 % (ref <7.0)

## 2018-12-17 ENCOUNTER — Other Ambulatory Visit: Payer: Self-pay | Admitting: Family Medicine

## 2018-12-17 ENCOUNTER — Encounter: Payer: Self-pay | Admitting: Family Medicine

## 2018-12-17 DIAGNOSIS — M47812 Spondylosis without myelopathy or radiculopathy, cervical region: Secondary | ICD-10-CM

## 2018-12-17 LAB — CBC WITH DIFFERENTIAL/PLATELET
Basophils Absolute: 0 10*3/uL (ref 0.0–0.2)
Basos: 1 %
EOS (ABSOLUTE): 0.1 10*3/uL (ref 0.0–0.4)
Eos: 2 %
Hematocrit: 40.9 % (ref 34.0–46.6)
Hemoglobin: 13.5 g/dL (ref 11.1–15.9)
Immature Grans (Abs): 0 10*3/uL (ref 0.0–0.1)
Immature Granulocytes: 0 %
Lymphocytes Absolute: 3 10*3/uL (ref 0.7–3.1)
Lymphs: 42 %
MCH: 25 pg — ABNORMAL LOW (ref 26.6–33.0)
MCHC: 33 g/dL (ref 31.5–35.7)
MCV: 76 fL — ABNORMAL LOW (ref 79–97)
Monocytes Absolute: 0.4 10*3/uL (ref 0.1–0.9)
Monocytes: 6 %
NEUTROS ABS: 3.6 10*3/uL (ref 1.4–7.0)
Neutrophils: 49 %
Platelets: 187 10*3/uL (ref 150–450)
RBC: 5.4 x10E6/uL — ABNORMAL HIGH (ref 3.77–5.28)
RDW: 13.9 % (ref 11.7–15.4)
WBC: 7.2 10*3/uL (ref 3.4–10.8)

## 2018-12-17 LAB — COMPREHENSIVE METABOLIC PANEL
ALT: 30 IU/L (ref 0–32)
AST: 25 IU/L (ref 0–40)
Albumin/Globulin Ratio: 2.1 (ref 1.2–2.2)
Albumin: 5 g/dL — ABNORMAL HIGH (ref 3.8–4.8)
Alkaline Phosphatase: 76 IU/L (ref 39–117)
BUN/Creatinine Ratio: 15 (ref 9–23)
BUN: 14 mg/dL (ref 6–24)
Bilirubin Total: 0.3 mg/dL (ref 0.0–1.2)
CHLORIDE: 101 mmol/L (ref 96–106)
CO2: 20 mmol/L (ref 20–29)
Calcium: 10.2 mg/dL (ref 8.7–10.2)
Creatinine, Ser: 0.91 mg/dL (ref 0.57–1.00)
GFR calc Af Amer: 91 mL/min/{1.73_m2} (ref >59)
GFR calc non Af Amer: 79 mL/min/{1.73_m2} (ref >59)
GLUCOSE: 107 mg/dL — AB (ref 65–99)
Globulin, Total: 2.4 g/dL (ref 1.5–4.5)
Potassium: 4.1 mmol/L (ref 3.5–5.2)
Sodium: 140 mmol/L (ref 134–144)
Total Protein: 7.4 g/dL (ref 6.0–8.5)

## 2018-12-17 LAB — LIPID PANEL W/O CHOL/HDL RATIO
CHOLESTEROL TOTAL: 182 mg/dL (ref 100–199)
HDL: 38 mg/dL — ABNORMAL LOW (ref >39)
LDL Calculated: 83 mg/dL (ref 0–99)
Triglycerides: 303 mg/dL — ABNORMAL HIGH (ref 0–149)
VLDL Cholesterol Cal: 61 mg/dL — ABNORMAL HIGH (ref 5–40)

## 2018-12-17 LAB — IRON AND TIBC
Iron Saturation: 15 % (ref 15–55)
Iron: 61 ug/dL (ref 27–159)
Total Iron Binding Capacity: 415 ug/dL (ref 250–450)
UIBC: 354 ug/dL (ref 131–425)

## 2018-12-17 LAB — FERRITIN: Ferritin: 29 ng/mL (ref 15–150)

## 2018-12-17 LAB — CYTOLOGY - PAP
Diagnosis: NEGATIVE
HPV: DETECTED — AB
Trichomonas: NEGATIVE

## 2018-12-17 LAB — HIV ANTIBODY (ROUTINE TESTING W REFLEX): HIV Screen 4th Generation wRfx: NONREACTIVE

## 2018-12-17 LAB — TSH: TSH: 1.01 u[IU]/mL (ref 0.450–4.500)

## 2018-12-27 ENCOUNTER — Telehealth: Payer: Self-pay | Admitting: Family Medicine

## 2018-12-27 NOTE — Telephone Encounter (Signed)
Message relayed to [facility]. Verbalized understanding and denied questions.

## 2018-12-27 NOTE — Telephone Encounter (Signed)
She has a history of GI bleed- she cannot take any NSAIDs. She can take tylenol otherwise it will be whatever the dentist recommends, but it would be a controlled substance.

## 2018-12-27 NOTE — Telephone Encounter (Signed)
Copied from [place] [id number]. Topic: Quick Communication - Rx Refill/Question >> Dec 27, 2018 11:58 AM Berneta Levins wrote: Aldona Bar from [facility] calling.  States that pt had to have a tooth extraction today and told office that she can't take naproxen.  Office would like to know what PCP would recommend for pain relief.  Aldona Bar (or anyone who answers the phone) can be reached at [phone].

## 2019-02-24 ENCOUNTER — Other Ambulatory Visit: Payer: Self-pay | Admitting: Family Medicine

## 2019-02-28 ENCOUNTER — Other Ambulatory Visit: Payer: Self-pay | Admitting: Family Medicine

## 2019-03-01 ENCOUNTER — Other Ambulatory Visit: Payer: Self-pay | Admitting: Family Medicine

## 2019-03-04 ENCOUNTER — Other Ambulatory Visit: Payer: Self-pay | Admitting: Family Medicine

## 2019-03-04 NOTE — Telephone Encounter (Signed)
Requested Prescriptions  Pending Prescriptions Disp Refills  . metFORMIN (GLUCOPHAGE) 500 MG tablet [Pharmacy Med Name: METFORMIN '500MG'$  TABLETS] 90 tablet 1    Sig: TAKE 1 TABLET(500 MG) BY MOUTH DAILY WITH BREAKFAST     Endocrinology:  Diabetes - Biguanides Passed - 03/04/2019 12:40 PM      Passed - Cr in normal range and within 360 days    Creatinine  Date Value Ref Range Status  04/09/2014 0.89 0.60 - 1.30 mg/dL Final   Creatinine, Ser  Date Value Ref Range Status  12/14/2018 0.91 0.57 - 1.00 mg/dL Final         Passed - HBA1C is between 0 and 7.9 and within 180 days    HB A1C (BAYER DCA - WAIVED)  Date Value Ref Range Status  12/13/2018 6.1 <7.0 % Final    Comment:                                          Diabetic Adult            <7.0                                       Healthy Adult        4.3 - 5.7                                                           (DCCT/NGSP) American Diabetes Association's Summary of Glycemic Recommendations for Adults with Diabetes: Hemoglobin A1c <7.0%. More stringent glycemic goals (A1c <6.0%) may further reduce complications at the cost of increased risk of hypoglycemia.          Passed - eGFR in normal range and within 360 days    EGFR (African American)  Date Value Ref Range Status  04/09/2014 >60  Final   GFR calc Af Amer  Date Value Ref Range Status  12/14/2018 91 >59 mL/min/1.73 Final   EGFR (Non-African Amer.)  Date Value Ref Range Status  04/09/2014 >60  Final    Comment:    eGFR values <29m/min/1.73 m2 may be an indication of chronic kidney disease (CKD). Calculated eGFR is useful in patients with stable renal function. The eGFR calculation will not be reliable in acutely ill patients when serum creatinine is changing rapidly. It is not useful in  patients on dialysis. The eGFR calculation may not be applicable to patients at the low and high extremes of body sizes, pregnant women, and vegetarians.    GFR calc non  Af Amer  Date Value Ref Range Status  12/14/2018 79 >59 mL/min/1.73 Final         Passed - Valid encounter within last 6 months    Recent Outpatient Visits          2 months ago Routine general medical examination at a health care facility   [facility] [place] [place]   5 months ago 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, without long-term current use of insulin [place]   [facility]   8 months ago  Class 2 severe obesity due to excess calories with serious comorbidity and body mass index (BMI) of 37.0 to 37.9 in adult [facility])   [facility], Megan P, DO   9 months ago 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, without long-term current use of insulin [facility])   [place], [place], DO   9 months ago [place], Lilia Argue, [place]      Future Appointments            In 2 months Wynetta Emery, Barb Merino, DO [employer], [facility]

## 2019-05-02 ENCOUNTER — Telehealth: Payer: Self-pay | Admitting: Family Medicine

## 2019-05-02 NOTE — Telephone Encounter (Signed)
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 Pt would like Dr Durenda Age suggestion. Please advise.

## 2019-05-03 NOTE — Telephone Encounter (Signed)
She is. Form filled out

## 2019-05-26 ENCOUNTER — Other Ambulatory Visit: Payer: Self-pay | Admitting: Family Medicine

## 2019-05-26 DIAGNOSIS — N181 Chronic kidney disease, stage 1: Secondary | ICD-10-CM

## 2019-05-26 DIAGNOSIS — I129 Hypertensive chronic kidney disease with stage 1 through stage 4 chronic kidney disease, or unspecified chronic kidney disease: Secondary | ICD-10-CM

## 2019-05-26 DIAGNOSIS — E1122 Type 2 diabetes mellitus with diabetic chronic kidney disease: Secondary | ICD-10-CM

## 2019-05-26 DIAGNOSIS — D500 Iron deficiency anemia secondary to blood loss (chronic): Secondary | ICD-10-CM

## 2019-05-26 NOTE — Telephone Encounter (Signed)
Requested medication (s) are due for refill today: yes  Requested medication (s) are on the active medication list: yes  Last refill:  02/25/19  Future visit scheduled: yes  Notes to clinic:  Medication not delegated to NT to refill   Requested Prescriptions  Pending Prescriptions Disp Refills   cyclobenzaprine (FLEXERIL) 10 MG tablet [Pharmacy Med Name: CYCLOBENZAPRINE 10MG  TABLETS] 30 tablet 2    Sig: TAKE 1 TABLET(10 MG) BY MOUTH AT BEDTIME     Not Delegated - Analgesics:  Muscle Relaxants Failed - 05/26/2019  8:37 AM      Failed - This refill cannot be delegated      Passed - Valid encounter within last 6 months    Recent Outpatient Visits          5 months ago Routine general medical examination at a health care facility   [facility], [place] P, DO   8 months ago 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, without long-term current use of insulin ([place])   [facility], [place] P, DO   10 months ago Class 2 severe obesity due to excess calories with serious comorbidity and body mass index (BMI) of 37.0 to 37.9 in adult [facility])   [facility], [place] P, DO   12 months ago 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, without long-term current use of insulin ([place])   [place], [place], DO   1 year ago Uvulitis   [place], Lilia Argue, [place]      Future Appointments            In 1 week Wynetta Emery, Barb Merino, DO [employer]

## 2019-05-30 ENCOUNTER — Ambulatory Visit: Payer: BC Managed Care – PPO | Admitting: Family Medicine

## 2019-06-06 ENCOUNTER — Ambulatory Visit: Payer: BC Managed Care – PPO | Admitting: Family Medicine

## 2019-06-20 ENCOUNTER — Ambulatory Visit (INDEPENDENT_AMBULATORY_CARE_PROVIDER_SITE_OTHER): Payer: BC Managed Care – PPO | Admitting: Family Medicine

## 2019-06-20 ENCOUNTER — Other Ambulatory Visit: Payer: Self-pay

## 2019-06-20 ENCOUNTER — Encounter: Payer: Self-pay | Admitting: Family Medicine

## 2019-06-20 VITALS — BP 130/85 | HR 93 | Temp 98.7°F

## 2019-06-20 DIAGNOSIS — E1122 Type 2 diabetes mellitus with diabetic chronic kidney disease: Secondary | ICD-10-CM

## 2019-06-20 DIAGNOSIS — N181 Chronic kidney disease, stage 1: Secondary | ICD-10-CM | POA: Diagnosis not present

## 2019-06-20 DIAGNOSIS — I129 Hypertensive chronic kidney disease with stage 1 through stage 4 chronic kidney disease, or unspecified chronic kidney disease: Secondary | ICD-10-CM

## 2019-06-20 DIAGNOSIS — I2510 Atherosclerotic heart disease of native coronary artery without angina pectoris: Secondary | ICD-10-CM

## 2019-06-20 DIAGNOSIS — F3170 Bipolar disorder, currently in remission, most recent episode unspecified: Secondary | ICD-10-CM

## 2019-06-20 DIAGNOSIS — D500 Iron deficiency anemia secondary to blood loss (chronic): Secondary | ICD-10-CM | POA: Diagnosis not present

## 2019-06-20 LAB — BAYER DCA HB A1C WAIVED: HB A1C (BAYER DCA - WAIVED): 6.6 % (ref <7.0)

## 2019-06-20 MED ORDER — LISINOPRIL 5 MG PO TABS: ORAL_TABLET | ORAL | 1 refills | Status: DC

## 2019-06-20 MED ORDER — TRAZODONE HCL 100 MG PO TABS: ORAL_TABLET | ORAL | 6 refills | Status: DC

## 2019-06-20 MED ORDER — METFORMIN HCL 500 MG PO TABS: ORAL_TABLET | ORAL | 1 refills | Status: DC

## 2019-06-20 MED ORDER — PANTOPRAZOLE SODIUM 40 MG PO TBEC: DELAYED_RELEASE_TABLET | ORAL | 1 refills | Status: DC

## 2019-06-20 MED ORDER — CYCLOBENZAPRINE HCL 10 MG PO TABS: ORAL_TABLET | ORAL | 2 refills | Status: DC

## 2019-06-20 MED ORDER — FERROUS SULFATE 325 (65 FE) MG PO TABS: 325.0000 mg | ORAL_TABLET | Freq: Three times a day (TID) | ORAL | 6 refills | Status: DC

## 2019-06-20 MED ORDER — ALBUTEROL SULFATE HFA 108 (90 BASE) MCG/ACT IN AERS: 2.0000 | INHALATION_SPRAY | Freq: Four times a day (QID) | RESPIRATORY_TRACT | 6 refills | Status: DC | PRN

## 2019-06-20 NOTE — Assessment & Plan Note (Signed)
Slightly elevated from last time with A1c of 6.6. Continue current regimen. Continue to monitor. Call with any concerns. Refills given. Labs drawn today.

## 2019-06-20 NOTE — Assessment & Plan Note (Signed)
Rechecking levels today. Treat as needed. Call with any concerns.  

## 2019-06-20 NOTE — Assessment & Plan Note (Signed)
Follows with psychiatry. Continue to monitor. Call with any concerns.  

## 2019-06-20 NOTE — Assessment & Plan Note (Signed)
Under good control on current regimen. Continue current regimen. Continue to monitor. Call with any concerns. Refills given. Labs drawn today.   

## 2019-06-20 NOTE — Assessment & Plan Note (Signed)
Will keep BP, sugars and cholesterol under good control. Continue to monitor. Call with any concerns.  

## 2019-06-20 NOTE — Progress Notes (Signed)
BP 130/85   Pulse 93   Temp 98.7 F (37.1 C)   SpO2 97%    Subjective:    Patient ID: [name], female    DOB: 07/27/1976, 43 y.o.   MRN: [id number]  HPI: [name] is a 43 y.o. female  Chief Complaint  Patient presents with  . Hypertension  . Hyperlipidemia  . Diabetes  . Anemia  . Asthma    refill on albuterol   HYPERTENSION / HYPERLIPIDEMIA Satisfied with current treatment? yes Duration of hypertension: chronic BP monitoring frequency: not checking BP medication side effects: no Past BP meds: lisinopril Duration of hyperlipidemia: chronic Cholesterol medication side effects: not on anything Cholesterol supplements: none Past cholesterol medications: none Medication compliance: excellent compliance Aspirin: no Recent stressors: yes Recurrent headaches: no Visual changes: no Palpitations: yes Dyspnea: no Chest pain: no Lower extremity edema: yes Dizzy/lightheaded: no  DIABETES Hypoglycemic episodes:no Polydipsia/polyuria: no Visual disturbance: no Chest pain: no Paresthesias: no Glucose Monitoring: no Taking Insulin?: no Blood Pressure Monitoring: not checking Retinal Examination: Not up to Date Foot Exam: Up to Date Diabetic Education: Completed Pneumovax: Up to Date Influenza: Not up to Date Aspirin: no  BIPOLAR Mood status: worse Satisfied with current treatment?: yes Symptom severity: moderate  Duration of current treatment : chronic Side effects: no Medication compliance: excellent compliance Psychotherapy/counseling: no  Depressed mood: yes Anxious mood: yes Anhedonia: no Significant weight loss or gain: no Insomnia: no  Fatigue: yes Feelings of worthlessness or guilt: no Impaired concentration/indecisiveness: no Suicidal ideations: no Hopelessness: no Crying spells: no Depression screen [facility] 2/9 12/13/2018 11/14/2017  Decreased Interest 0 1  Down, Depressed, Hopeless 1 1  PHQ - 2 Score 1 2  Altered sleeping 3  1  Tired, decreased energy 3 2  Change in appetite 3 1  Feeling bad or failure about yourself  2 1  Trouble concentrating 2 0  Moving slowly or fidgety/restless 0 1  Suicidal thoughts 0 0  PHQ-9 Score 14 8  Difficult doing work/chores Somewhat difficult -   ANEMIA Anemia status: stable Etiology of anemia: iron deficiency Duration of anemia treatment: chronic Compliance with treatment: good compliance Iron supplementation side effects: no Severity of anemia: severe Fatigue: yes Decreased exercise tolerance: no  Dyspnea on exertion: yes Palpitations: yes Bleeding: no Pica: yes  Relevant past medical, surgical, family and social history reviewed and updated as indicated. Interim medical history since our last visit reviewed. Allergies and medications reviewed and updated.  Review of Systems  Constitutional: Negative.   Respiratory: Negative.   Cardiovascular: Negative.   Musculoskeletal: Negative.   Skin: Negative.   Neurological: Negative.   Psychiatric/Behavioral: Negative for agitation, behavioral problems, confusion, decreased concentration, dysphoric mood, hallucinations, self-injury, sleep disturbance and suicidal ideas. The patient is nervous/anxious. The patient is not hyperactive.     Per HPI unless specifically indicated above     Objective:    BP 130/85   Pulse 93   Temp 98.7 F (37.1 C)   SpO2 97%   Wt Readings from Last 3 Encounters:  12/13/18 252 lb 6 oz (114.5 kg)  09/07/18 247 lb (112 kg)  07/06/18 249 lb 6 oz (113.1 kg)    Physical Exam Vitals signs and nursing note reviewed.  Constitutional:      General: She is not in acute distress.    Appearance: Normal appearance. She is not ill-appearing, toxic-appearing or diaphoretic.  HENT:     Head: Normocephalic and atraumatic.     Right  Ear: External ear normal.     Left Ear: External ear normal.     Nose: Nose normal.     Mouth/Throat:     Mouth: Mucous membranes are moist.     Pharynx:  Oropharynx is clear.  Eyes:     General: No scleral icterus.       Right eye: No discharge.        Left eye: No discharge.     Conjunctiva/sclera: Conjunctivae normal.     Pupils: Pupils are equal, round, and reactive to light.  Neck:     Musculoskeletal: Normal range of motion.  Pulmonary:     Effort: Pulmonary effort is normal. No respiratory distress.     Comments: Speaking in full sentences Musculoskeletal: Normal range of motion.  Skin:    Coloration: Skin is not jaundiced or pale.     Findings: No bruising, erythema, lesion or rash.  Neurological:     Mental Status: She is alert and oriented to person, place, and time. Mental status is at baseline.  Psychiatric:        Mood and Affect: Mood normal.        Behavior: Behavior normal.        Thought Content: Thought content normal.        Judgment: Judgment normal.     Results for orders placed or performed in visit on 06/20/19  Bayer DCA Hb A1c Waived  Result Value Ref Range   HB A1C (BAYER DCA - WAIVED) 6.6 <7.0 %      Assessment & Plan:   Problem List Items Addressed This Visit      Cardiovascular and Mediastinum   CAD (coronary artery disease) - Primary    Will keep BP, sugars and cholesterol under good control. Continue to monitor. Call with any concerns.       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 Continue current regimen. Continue to monitor. Call with any concerns. Refills given. Labs drawn today.       Relevant Medications   lisinopril (ZESTRIL) 5 MG tablet   metFORMIN (GLUCOPHAGE) 500 MG tablet     Genitourinary   Chronic kidney disease    Rechecking levels today. Treat as needed. Call with any concerns.       Benign hypertensive renal disease    Under good control on current regimen. Continue current regimen. Continue to monitor. Call with any  concerns. Refills given. Labs drawn today.         Other   Bipolar disorder ([place])    Follows with psychiatry. Continue to monitor. Call with any concerns.       Iron deficiency anemia due to chronic blood loss    Rechecking levels today. Treat as needed. Call with any concerns.       Relevant Medications   ferrous sulfate (FERROUSUL) 325 (65 FE) MG tablet       Follow up plan: Return in about 3 months (around 09/19/2019) for DM follow up.    . This visit was completed via D[employer]imity due to the restrictions of the COVID-19 pandemic. All issues as above were discussed and addressed. Physical exam was done as above through visual confirmation on Doximity. If it was felt that the patient should be evaluated in the office, they were directed there. The patient verbally consented to  this visit. . Location of the patient: home . Location of the provider: home . Those involved with this call:  . Provider: Park Liter, DO . CMA: Tiffany Reel, CMA . Front Desk/Registration: Don Perking  . Time spent on call: 25 minutes with patient face to face via video conference. More than 50% of this time was spent in counseling and coordination of care. 40 minutes total spent in review of patient's record and preparation of their chart.

## 2019-06-21 ENCOUNTER — Other Ambulatory Visit: Payer: Self-pay | Admitting: Family Medicine

## 2019-06-21 DIAGNOSIS — D500 Iron deficiency anemia secondary to blood loss (chronic): Secondary | ICD-10-CM

## 2019-06-21 LAB — LIPID PANEL W/O CHOL/HDL RATIO
Cholesterol, Total: 173 mg/dL (ref 100–199)
HDL: 29 mg/dL — ABNORMAL LOW (ref >39)
LDL Chol Calc (NIH): 55 mg/dL (ref 0–99)
Triglycerides: 603 mg/dL (ref 0–149)
VLDL Cholesterol Cal: 89 mg/dL — ABNORMAL HIGH (ref 5–40)

## 2019-06-21 LAB — CBC WITH DIFFERENTIAL/PLATELET
Basophils Absolute: 0 10*3/uL (ref 0.0–0.2)
Basos: 1 %
EOS (ABSOLUTE): 0.1 10*3/uL (ref 0.0–0.4)
Eos: 1 %
Hematocrit: 41.1 % (ref 34.0–46.6)
Hemoglobin: 12.7 g/dL (ref 11.1–15.9)
Immature Grans (Abs): 0 10*3/uL (ref 0.0–0.1)
Immature Granulocytes: 1 %
Lymphocytes Absolute: 3 10*3/uL (ref 0.7–3.1)
Lymphs: 36 %
MCH: 24 pg — ABNORMAL LOW (ref 26.6–33.0)
MCHC: 30.9 g/dL — ABNORMAL LOW (ref 31.5–35.7)
MCV: 78 fL — ABNORMAL LOW (ref 79–97)
Monocytes Absolute: 0.7 10*3/uL (ref 0.1–0.9)
Monocytes: 8 %
Neutrophils Absolute: 4.5 10*3/uL (ref 1.4–7.0)
Neutrophils: 53 %
Platelets: 238 10*3/uL (ref 150–450)
RBC: 5.3 x10E6/uL — ABNORMAL HIGH (ref 3.77–5.28)
RDW: 15.1 % (ref 11.7–15.4)
WBC: 8.3 10*3/uL (ref 3.4–10.8)

## 2019-06-21 LAB — IRON AND TIBC
Iron Saturation: 8 % — CL (ref 15–55)
Iron: 31 ug/dL (ref 27–159)
Total Iron Binding Capacity: 399 ug/dL (ref 250–450)
UIBC: 368 ug/dL (ref 131–425)

## 2019-06-21 LAB — COMPREHENSIVE METABOLIC PANEL
ALT: 29 IU/L (ref 0–32)
AST: 23 IU/L (ref 0–40)
Albumin/Globulin Ratio: 1.6 (ref 1.2–2.2)
Albumin: 4.7 g/dL (ref 3.8–4.8)
Alkaline Phosphatase: 73 IU/L (ref 39–117)
BUN/Creatinine Ratio: 14 (ref 9–23)
BUN: 11 mg/dL (ref 6–24)
Bilirubin Total: 0.2 mg/dL (ref 0.0–1.2)
CO2: 18 mmol/L — ABNORMAL LOW (ref 20–29)
Calcium: 10 mg/dL (ref 8.7–10.2)
Chloride: 101 mmol/L (ref 96–106)
Creatinine, Ser: 0.76 mg/dL (ref 0.57–1.00)
GFR calc Af Amer: 113 mL/min/{1.73_m2} (ref >59)
GFR calc non Af Amer: 98 mL/min/{1.73_m2} (ref >59)
Globulin, Total: 2.9 g/dL (ref 1.5–4.5)
Glucose: 130 mg/dL — ABNORMAL HIGH (ref 65–99)
Potassium: 4.3 mmol/L (ref 3.5–5.2)
Sodium: 137 mmol/L (ref 134–144)
Total Protein: 7.6 g/dL (ref 6.0–8.5)

## 2019-06-21 LAB — FERRITIN: Ferritin: 16 ng/mL (ref 15–150)

## 2019-06-24 ENCOUNTER — Ambulatory Visit: Payer: BC Managed Care – PPO | Admitting: Family Medicine

## 2019-06-25 ENCOUNTER — Other Ambulatory Visit: Payer: Self-pay | Admitting: Internal Medicine

## 2019-06-25 NOTE — Progress Notes (Signed)
Pt can be seen next week; MD visit; No labs; Venofer infusion.   C- please schedule. Thx  GB

## 2019-07-02 ENCOUNTER — Inpatient Hospital Stay: Payer: BC Managed Care – PPO

## 2019-07-02 ENCOUNTER — Inpatient Hospital Stay: Payer: BC Managed Care – PPO | Attending: Internal Medicine | Admitting: Internal Medicine

## 2019-07-02 ENCOUNTER — Encounter: Payer: Self-pay | Admitting: Internal Medicine

## 2019-07-02 ENCOUNTER — Other Ambulatory Visit: Payer: Self-pay

## 2019-07-02 VITALS — BP 142/80 | HR 93 | Resp 18

## 2019-07-02 DIAGNOSIS — D500 Iron deficiency anemia secondary to blood loss (chronic): Secondary | ICD-10-CM

## 2019-07-02 DIAGNOSIS — E119 Type 2 diabetes mellitus without complications: Secondary | ICD-10-CM | POA: Diagnosis not present

## 2019-07-02 DIAGNOSIS — N189 Chronic kidney disease, unspecified: Secondary | ICD-10-CM | POA: Diagnosis not present

## 2019-07-02 DIAGNOSIS — N920 Excessive and frequent menstruation with regular cycle: Secondary | ICD-10-CM | POA: Insufficient documentation

## 2019-07-02 DIAGNOSIS — F17210 Nicotine dependence, cigarettes, uncomplicated: Secondary | ICD-10-CM | POA: Insufficient documentation

## 2019-07-02 DIAGNOSIS — Z794 Long term (current) use of insulin: Secondary | ICD-10-CM | POA: Insufficient documentation

## 2019-07-02 DIAGNOSIS — Z7984 Long term (current) use of oral hypoglycemic drugs: Secondary | ICD-10-CM | POA: Insufficient documentation

## 2019-07-02 DIAGNOSIS — F319 Bipolar disorder, unspecified: Secondary | ICD-10-CM | POA: Diagnosis not present

## 2019-07-02 DIAGNOSIS — I252 Old myocardial infarction: Secondary | ICD-10-CM | POA: Insufficient documentation

## 2019-07-02 DIAGNOSIS — Z79899 Other long term (current) drug therapy: Secondary | ICD-10-CM | POA: Diagnosis not present

## 2019-07-02 MED ORDER — SODIUM CHLORIDE 0.9 % IV SOLN: 200.0000 mg | Freq: Once | INTRAVENOUS | Status: DC

## 2019-07-02 MED ORDER — SODIUM CHLORIDE 0.9 % IV SOLN
Freq: Once | INTRAVENOUS | Status: AC
  Administered 2019-07-02: 15:00:00 via INTRAVENOUS
  Filled 2019-07-02: qty 250

## 2019-07-02 MED ORDER — IRON SUCROSE 20 MG/ML IV SOLN
200.0000 mg | Freq: Once | INTRAVENOUS | Status: AC
  Administered 2019-07-02: 200 mg via INTRAVENOUS
  Filled 2019-07-02: qty 10

## 2019-07-02 NOTE — Progress Notes (Signed)
Pt in for follow up, states craving ice and very fatigued.  Some shortness of breath at minimal exertion.

## 2019-07-02 NOTE — Assessment & Plan Note (Addendum)
#   Iron deficiency anemia-status post IV iron-hemoglobin improved.  However patient is symptomatic with extreme fatigue shortness of breath on exertion. Today hemoglobin 12.7;  Saturation 8%.  Proceed with IV Venofer today  # Menorrhagia- ?  Recommend talking to PCP regarding birth control pills.  # DISPOSITION:  # Proceed with venofer today.   # Venofer IV in 1 week # 4 months- MD;  cbc/possible IV venofer- Dr.B.
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
## 2019-07-08 ENCOUNTER — Other Ambulatory Visit: Payer: Self-pay

## 2019-07-09 ENCOUNTER — Other Ambulatory Visit: Payer: Self-pay

## 2019-07-09 ENCOUNTER — Inpatient Hospital Stay: Payer: BC Managed Care – PPO

## 2019-07-09 VITALS — BP 158/91 | HR 112 | Temp 98.8°F | Resp 20

## 2019-07-09 DIAGNOSIS — D500 Iron deficiency anemia secondary to blood loss (chronic): Secondary | ICD-10-CM

## 2019-07-09 MED ORDER — SODIUM CHLORIDE 0.9 % IV SOLN
Freq: Once | INTRAVENOUS | Status: AC
  Administered 2019-07-09: 14:00:00 via INTRAVENOUS
  Filled 2019-07-09: qty 250

## 2019-07-09 MED ORDER — SODIUM CHLORIDE 0.9 % IV SOLN: 200.0000 mg | Freq: Once | INTRAVENOUS | Status: DC

## 2019-07-09 MED ORDER — IRON SUCROSE 20 MG/ML IV SOLN
200.0000 mg | Freq: Once | INTRAVENOUS | Status: AC
  Administered 2019-07-09: 200 mg via INTRAVENOUS
  Filled 2019-07-09: qty 10

## 2019-08-09 ENCOUNTER — Encounter: Payer: Self-pay | Admitting: Family Medicine

## 2019-08-09 ENCOUNTER — Other Ambulatory Visit: Payer: Self-pay

## 2019-08-09 ENCOUNTER — Ambulatory Visit (INDEPENDENT_AMBULATORY_CARE_PROVIDER_SITE_OTHER): Payer: BC Managed Care – PPO | Admitting: Family Medicine

## 2019-08-09 DIAGNOSIS — Z20828 Contact with and (suspected) exposure to other viral communicable diseases: Secondary | ICD-10-CM | POA: Diagnosis not present

## 2019-08-09 DIAGNOSIS — Z20822 Contact with and (suspected) exposure to covid-19: Secondary | ICD-10-CM

## 2019-08-09 MED ORDER — METFORMIN HCL 500 MG PO TABS: ORAL_TABLET | ORAL | 1 refills | Status: DC

## 2019-08-09 NOTE — Progress Notes (Signed)
There were no vitals taken for this visit.   Subjective:    Patient ID: [name], female    DOB: [birth year], 43 y.o.   MRN: [id number]  HPI: [name] is a 43 y.o. female  Chief Complaint  Patient presents with  . COVID    Patient's roommate came in contact with someone that was COVID positive     . This visit was completed via WebEx due to the restrictions of the COVID-19 pandemic. All issues as above were discussed and addressed. Physical exam was done as above through visual confirmation on WebEx. If it was felt that the patient should be evaluated in the office, they were directed there. The patient verbally consented to this visit. . Location of the patient: home . Location of the provider: work . Those involved with this call:  . Provider: Merrie Roof, PA-C . CMA: Tiffany Reel, CMA . Front Desk/Registration: Jill Side  . Time spent on call: 15 minutes with patient face to face via video conference. More than 50% of this time was spent in counseling and coordination of care. 5 minutes total spent in review of patient's record and preparation of their chart. I verified patient identity using two factors (patient name and date of birth). Patient consents verbally to being seen via telemedicine visit today.   Patient's roommate had an exposure to COVID 19 about 3 days ago, had been around them several days before as well. Now roommate had a fever of 101 since yesterday. Patient states she is asymptomatic, no fever, cough, congestion, body aches, N/V/D, loss of taste or smell. Was tested this morning for COVID 19.  Relevant past medical, surgical, family and social history reviewed and updated as indicated. Interim medical history since our last visit reviewed. Allergies and medications reviewed and updated.  Review of Systems  Per HPI unless specifically indicated above     Objective:    There were no vitals taken for this visit.  Wt Readings from  Last 3 Encounters:  07/02/19 273 lb 6.4 oz (124 kg)  12/13/18 252 lb 6 oz (114.5 kg)  09/07/18 247 lb (112 kg)    Physical Exam Vitals signs and nursing note reviewed.  Constitutional:      General: She is not in acute distress.    Appearance: Normal appearance.  HENT:     Head: Atraumatic.     Right Ear: External ear normal.     Left Ear: External ear normal.     Nose: Nose normal. No congestion.     Mouth/Throat:     Mouth: Mucous membranes are moist.     Pharynx: Oropharynx is clear. No posterior oropharyngeal erythema.  Eyes:     Extraocular Movements: Extraocular movements intact.     Conjunctiva/sclera: Conjunctivae normal.  Neck:     Musculoskeletal: Normal range of motion.  Cardiovascular:     Comments: Unable to assess via virtual visit Pulmonary:     Effort: Pulmonary effort is normal. No respiratory distress.  Musculoskeletal: Normal range of motion.  Skin:    General: Skin is dry.     Findings: No erythema.  Neurological:     Mental Status: She is alert and oriented to person, place, and time.  Psychiatric:        Mood and Affect: Mood normal.        Thought Content: Thought content normal.        Judgment: Judgment normal.     Results for orders  placed or performed in visit on 06/20/19  Ferritin  Result Value Ref Range   Ferritin 16 15 - 150 ng/mL  Iron and TIBC  Result Value Ref Range   Total Iron Binding Capacity 399 250 - 450 ug/dL   UIBC 368 131 - 425 ug/dL   Iron 31 27 - 159 ug/dL   Iron Saturation 8 (LL) 15 - 55 %  Lipid Panel w/o Chol/HDL Ratio  Result Value Ref Range   Cholesterol, Total 173 100 - 199 mg/dL   Triglycerides 603 (HH) 0 - 149 mg/dL   HDL 29 (L) >39 mg/dL   VLDL Cholesterol Cal 89 (H) 5 - 40 mg/dL   LDL Chol Calc (NIH) 55 0 - 99 mg/dL  Comprehensive metabolic panel  Result Value Ref Range   Glucose 130 (H) 65 - 99 mg/dL   BUN 11 6 - 24 mg/dL   Creatinine, Ser 0.76 0.57 - 1.00 mg/dL   GFR calc non Af Amer 98 >59  mL/min/1.73   GFR calc Af Amer 113 >59 mL/min/1.73   BUN/Creatinine Ratio 14 9 - 23   Sodium 137 134 - 144 mmol/L   Potassium 4.3 3.5 - 5.2 mmol/L   Chloride 101 96 - 106 mmol/L   CO2 18 (L) 20 - 29 mmol/L   Calcium 10.0 8.7 - 10.2 mg/dL   Total Protein 7.6 6.0 - 8.5 g/dL   Albumin 4.7 3.8 - 4.8 g/dL   Globulin, Total 2.9 1.5 - 4.5 g/dL   Albumin/Globulin Ratio 1.6 1.2 - 2.2   Bilirubin Total 0.2 0.0 - 1.2 mg/dL   Alkaline Phosphatase 73 39 - 117 IU/L   AST 23 0 - 40 IU/L   ALT 29 0 - 32 IU/L  CBC with Differential/Platelet  Result Value Ref Range   WBC 8.3 3.4 - 10.8 x10E3/uL   RBC 5.30 (H) 3.77 - 5.28 x10E6/uL   Hemoglobin 12.7 11.1 - 15.9 g/dL   Hematocrit 41.1 34.0 - 46.6 %   MCV 78 (L) 79 - 97 fL   MCH 24.0 (L) 26.6 - 33.0 pg   MCHC 30.9 (L) 31.5 - 35.7 g/dL   RDW 15.1 11.7 - 15.4 %   Platelets 238 150 - 450 x10E3/uL   Neutrophils 53 Not Estab. %   Lymphs 36 Not Estab. %   Monocytes 8 Not Estab. %   Eos 1 Not Estab. %   Basos 1 Not Estab. %   Neutrophils Absolute 4.5 1.4 - 7.0 x10E3/uL   Lymphocytes Absolute 3.0 0.7 - 3.1 x10E3/uL   Monocytes Absolute 0.7 0.1 - 0.9 x10E3/uL   EOS (ABSOLUTE) 0.1 0.0 - 0.4 x10E3/uL   Basophils Absolute 0.0 0.0 - 0.2 x10E3/uL   Immature Granulocytes 1 Not Estab. %   Immature Grans (Abs) 0.0 0.0 - 0.1 x10E3/uL  Bayer DCA Hb A1c Waived  Result Value Ref Range   HB A1C (BAYER DCA - WAIVED) 6.6 <7.0 %      Assessment & Plan:   Problem List Items Addressed This Visit    None    Visit Diagnoses    Exposure to COVID-19 virus    -  Primary   Await test results, quarantine until neg result. Call with development of sxs. Supportive care reviewed       Follow up plan: Return if symptoms worsen or fail to improve.

## 2019-08-11 LAB — NOVEL CORONAVIRUS, NAA: SARS-CoV-2, NAA: NOT DETECTED

## 2019-08-13 ENCOUNTER — Encounter: Payer: Self-pay | Admitting: Family Medicine

## 2019-08-13 ENCOUNTER — Ambulatory Visit (INDEPENDENT_AMBULATORY_CARE_PROVIDER_SITE_OTHER): Payer: BC Managed Care – PPO | Admitting: Family Medicine

## 2019-08-13 ENCOUNTER — Other Ambulatory Visit: Payer: Self-pay

## 2019-08-13 DIAGNOSIS — J069 Acute upper respiratory infection, unspecified: Secondary | ICD-10-CM

## 2019-08-13 DIAGNOSIS — H9201 Otalgia, right ear: Secondary | ICD-10-CM

## 2019-08-13 MED ORDER — FLUTICASONE PROPIONATE 50 MCG/ACT NA SUSP: 1.0000 | Freq: Two times a day (BID) | NASAL | 6 refills | Status: DC

## 2019-08-13 MED ORDER — GUAIFENESIN ER 600 MG PO TB12: 600.0000 mg | ORAL_TABLET | Freq: Two times a day (BID) | ORAL | 0 refills | Status: DC | PRN

## 2019-08-13 MED ORDER — AMOXICILLIN-POT CLAVULANATE 875-125 MG PO TABS: 1.0000 | ORAL_TABLET | Freq: Two times a day (BID) | ORAL | 0 refills | Status: DC

## 2019-08-13 NOTE — Progress Notes (Signed)
There were no vitals taken for this visit.   Subjective:    Patient ID: [name], female    DOB: 1975-10-27, 43 y.o.   MRN: [id number]  HPI: [name] is a 43 y.o. female  Chief Complaint  Patient presents with  . URI    pt states she started having R ear pain yesterday, states the congestion, pressure, and cough started this past Friday    . This visit was completed via WebEx due to the restrictions of the COVID-19 pandemic. All issues as above were discussed and addressed. Physical exam was done as above through visual confirmation on WebEx. If it was felt that the patient should be evaluated in the office, they were directed there. The patient verbally consented to this visit. . Location of the patient: home . Location of the provider: work . Those involved with this call:  . Provider: Merrie Roof, PA-C . CMA: Tiffany Reel, CMA . Front Desk/Registration: Jill Side  . Time spent on call: 15 minutes with patient face to face via video conference. More than 50% of this time was spent in counseling and coordination of care. 5 minutes total spent in review of patient's record and preparation of their chart. I verified patient identity using two factors (patient name and date of birth). Patient consents verbally to being seen via telemedicine visit today.   Started having popping and crackling in the right ear several days ago, then sharp shooting pains. Still having congestion, and feeling things moving into her chest. She states she's very prone to getting bronchitis when sick. Ear drops are making things worse. Denies fevers, chills, body aches, CP, SOB. COVID 19 test came back negative earlier this week after sxs started.    Relevant past medical, surgical, family and social history reviewed and updated as indicated. Interim medical history since our last visit reviewed. Allergies and medications reviewed and updated.  Review of Systems  Per HPI unless  specifically indicated above     Objective:    There were no vitals taken for this visit.  Wt Readings from Last 3 Encounters:  07/02/19 273 lb 6.4 oz (124 kg)  12/13/18 252 lb 6 oz (114.5 kg)  09/07/18 247 lb (112 kg)    Physical Exam Vitals signs and nursing note reviewed.  Constitutional:      General: She is not in acute distress.    Appearance: Normal appearance.  HENT:     Head: Atraumatic.     Right Ear: External ear normal.     Left Ear: External ear normal.     Nose: Congestion present.     Mouth/Throat:     Mouth: Mucous membranes are moist.     Pharynx: Oropharynx is clear. Posterior oropharyngeal erythema present.  Eyes:     Extraocular Movements: Extraocular movements intact.     Conjunctiva/sclera: Conjunctivae normal.  Neck:     Musculoskeletal: Normal range of motion.  Cardiovascular:     Comments: Unable to assess via virtual visit Pulmonary:     Effort: Pulmonary effort is normal. No respiratory distress.  Musculoskeletal: Normal range of motion.  Skin:    General: Skin is dry.     Findings: No erythema.  Neurological:     Mental Status: She is alert and oriented to person, place, and time.  Psychiatric:        Mood and Affect: Mood normal.        Thought Content: Thought content normal.  Judgment: Judgment normal.     Results for orders placed or performed in visit on 08/09/19  Novel Coronavirus, NAA ([facility])   Specimen: Nasopharyngeal(NP) swabs in vial transport medium   NASOPHARYNGE  TESTING  Result Value Ref Range   SARS-CoV-2, NAA Not Detected Not Detected      Assessment & Plan:   Problem List Items Addressed This Visit    None    Visit Diagnoses    Right ear pain    -  Primary   Suspect becoming ear infection. Tx with augmentin, continued allergy regimen, mucinex, supportive home care.    Upper respiratory tract infection, unspecified type       COVID neg, not improving and now with possible ear infection. Tx with  augmentin, mucinex, supportive home care. Return precautions given       Follow up plan: Return if symptoms worsen or fail to improve.

## 2019-08-22 ENCOUNTER — Encounter: Payer: Self-pay | Admitting: Family Medicine

## 2019-08-22 MED ORDER — FLUCONAZOLE 150 MG PO TABS: 150.0000 mg | ORAL_TABLET | Freq: Once | ORAL | 0 refills | Status: AC

## 2019-08-25 ENCOUNTER — Encounter: Payer: Self-pay | Admitting: Family Medicine

## 2019-08-28 ENCOUNTER — Other Ambulatory Visit: Payer: Self-pay

## 2019-08-28 ENCOUNTER — Ambulatory Visit (INDEPENDENT_AMBULATORY_CARE_PROVIDER_SITE_OTHER): Payer: BC Managed Care – PPO | Admitting: Family Medicine

## 2019-08-28 ENCOUNTER — Encounter: Payer: Self-pay | Admitting: Family Medicine

## 2019-08-28 VITALS — BP 130/72 | HR 105 | Temp 99.0°F | Ht 72.0 in | Wt 266.0 lb

## 2019-08-28 DIAGNOSIS — Z23 Encounter for immunization: Secondary | ICD-10-CM | POA: Diagnosis not present

## 2019-08-28 DIAGNOSIS — N898 Other specified noninflammatory disorders of vagina: Secondary | ICD-10-CM

## 2019-08-28 LAB — UA/M W/RFLX CULTURE, ROUTINE
Bilirubin, UA: NEGATIVE
Ketones, UA: NEGATIVE
Leukocytes,UA: NEGATIVE
Nitrite, UA: NEGATIVE
Protein,UA: NEGATIVE
RBC, UA: NEGATIVE
Specific Gravity, UA: 1.02 (ref 1.005–1.030)
Urobilinogen, Ur: 0.2 mg/dL (ref 0.2–1.0)
pH, UA: 6.5 (ref 5.0–7.5)

## 2019-08-28 MED ORDER — ESTRADIOL 0.1 MG/GM VA CREA: 1.0000 | TOPICAL_CREAM | Freq: Every evening | VAGINAL | 0 refills | Status: DC | PRN

## 2019-08-28 NOTE — Progress Notes (Signed)
BP 130/72   Pulse (!) 105   Temp 99 F (37.2 C) (Oral)   Ht 6' (1.829 m)   Wt 266 lb (120.7 kg)   SpO2 97%   BMI 36.08 kg/m    Subjective:    Patient ID: [name], female    DOB: Nov 07, 1975, 43 y.o.   MRN: [id number]  HPI: [name] is a 43 y.o. female  Chief Complaint  Patient presents with  . Vaginal Pain    pt states has had a knot on her vaginal area. first noticed 3 weeks ago. states feels like its growing   3 weeks of a painful lump on right external labia near groin crease. Growing a bit, not draining or red. Denies recent shaving, new products, concern for STI, rashes, vaginal discharge, fevers. Not trying anything OTC.   Irregular periods the past few months, spotting, vaginal dryness and irritation. The irritation is very bothersome to her at this point. Has not tried anything OTC for relief.   Relevant past medical, surgical, family and social history reviewed and updated as indicated. Interim medical history since our last visit reviewed. Allergies and medications reviewed and updated.  Review of Systems  Per HPI unless specifically indicated above     Objective:    BP 130/72   Pulse (!) 105   Temp 99 F (37.2 C) (Oral)   Ht 6' (1.829 m)   Wt 266 lb (120.7 kg)   SpO2 97%   BMI 36.08 kg/m   Wt Readings from Last 3 Encounters:  08/28/19 266 lb (120.7 kg)  07/02/19 273 lb 6.4 oz (124 kg)  12/13/18 252 lb 6 oz (114.5 kg)    Physical Exam Vitals signs and nursing note reviewed. Exam conducted with a chaperone present.  Constitutional:      Appearance: Normal appearance. She is not ill-appearing.  HENT:     Head: Atraumatic.  Eyes:     Extraocular Movements: Extraocular movements intact.     Conjunctiva/sclera: Conjunctivae normal.  Neck:     Musculoskeletal: Normal range of motion and neck supple.  Cardiovascular:     Rate and Rhythm: Normal rate and regular rhythm.     Heart sounds: Normal heart sounds.  Pulmonary:   Effort: Pulmonary effort is normal.     Breath sounds: Normal breath sounds.  Abdominal:     General: Bowel sounds are normal.     Palpations: Abdomen is soft.     Tenderness: There is no abdominal tenderness. There is no right CVA tenderness, left CVA tenderness or guarding.  Genitourinary:    General: Normal vulva.     Labia:        Right: No rash.        Left: No rash.     Musculoskeletal: Normal range of motion.  Lymphadenopathy:     Lower Body: No right inguinal adenopathy.  Skin:    General: Skin is warm and dry.  Neurological:     Mental Status: She is alert and oriented to person, place, and time.  Psychiatric:        Mood and Affect: Mood normal.        Thought Content: Thought content normal.        Judgment: Judgment normal.     Results for orders placed or performed in visit on 08/28/19  UA/M w/rflx Culture, Routine   Specimen: Urine   URINE  Result Value Ref Range   Specific Gravity, UA 1.020 1.005 - 1.030  pH, UA 6.5 5.0 - 7.5   Color, UA Yellow Yellow   Appearance Ur Clear Clear   Leukocytes,UA Negative Negative   Protein,UA Negative Negative/Trace   Glucose, UA 1+ (A) Negative   Ketones, UA Negative Negative   RBC, UA Negative Negative   Bilirubin, UA Negative Negative   Urobilinogen, Ur 0.2 0.2 - 1.0 mg/dL   Nitrite, UA Negative Negative      Assessment & Plan:   Problem List Items Addressed This Visit    None    Visit Diagnoses    Vaginal dryness    -  Primary   Trial estrace cream prn, vaginal lubricants, hyaluronic acid prn. Avoid scented or harsh hygiene products, feminine wipes, etc   Relevant Orders   UA/M w/rflx Culture, Routine (Completed)   Vaginal cyst       Reassurance given. Warm compresses, OTC pain relievers, continue to monitor. F/u if not resolving   Flu vaccine need       Relevant Orders   Flu Vaccine QUAD 6+ mos PF IM (Fluarix Quad PF) (Completed)       Follow up plan: Return for as scheduled.

## 2019-09-19 ENCOUNTER — Encounter: Payer: Self-pay | Admitting: Family Medicine

## 2019-09-19 ENCOUNTER — Other Ambulatory Visit: Payer: Self-pay

## 2019-09-19 ENCOUNTER — Ambulatory Visit (INDEPENDENT_AMBULATORY_CARE_PROVIDER_SITE_OTHER): Payer: BC Managed Care – PPO | Admitting: Family Medicine

## 2019-09-19 DIAGNOSIS — N181 Chronic kidney disease, stage 1: Secondary | ICD-10-CM

## 2019-09-19 DIAGNOSIS — E1122 Type 2 diabetes mellitus with diabetic chronic kidney disease: Secondary | ICD-10-CM | POA: Diagnosis not present

## 2019-09-19 NOTE — Assessment & Plan Note (Signed)
Continues weight loss. Feeling well. Will get her in for A1c and if doing well will stop metformin. Await results. Call with any concerns.

## 2019-09-19 NOTE — Progress Notes (Signed)
There were no vitals taken for this visit.   Subjective:    Patient ID: [name], female    DOB: 1975/11/12, 43 y.o.   MRN: [id number]  HPI: [name] is a 43 y.o. female  Chief Complaint  Patient presents with  . Diabetes   DIABETES Hypoglycemic episodes:yes- occasionally Polydipsia/polyuria: no Visual disturbance: no Chest pain: no Paresthesias: no Glucose Monitoring: no  Accucheck frequency: Not Checking Taking Insulin?: no Blood Pressure Monitoring: not checking Retinal Examination: Not up to Date Foot Exam: Up to Date Diabetic Education: Completed Pneumovax: Up to Date Influenza: Up to Date Aspirin: no  Relevant past medical, surgical, family and social history reviewed and updated as indicated. Interim medical history since our last visit reviewed. Allergies and medications reviewed and updated.  Review of Systems  Constitutional: Negative.   Respiratory: Negative.   Cardiovascular: Negative.   Musculoskeletal: Negative.   Neurological: Negative.   Psychiatric/Behavioral: Negative.     Per HPI unless specifically indicated above     Objective:    There were no vitals taken for this visit.  Wt Readings from Last 3 Encounters:  08/28/19 266 lb (120.7 kg)  07/02/19 273 lb 6.4 oz (124 kg)  12/13/18 252 lb 6 oz (114.5 kg)    Physical Exam Vitals signs and nursing note reviewed.  Constitutional:      General: She is not in acute distress.    Appearance: Normal appearance. She is not ill-appearing, toxic-appearing or diaphoretic.  HENT:     Head: Normocephalic and atraumatic.     Right Ear: External ear normal.     Left Ear: External ear normal.     Nose: Nose normal.     Mouth/Throat:     Mouth: Mucous membranes are moist.     Pharynx: Oropharynx is clear.  Eyes:     General: No scleral icterus.       Right eye: No discharge.        Left eye: No discharge.     Conjunctiva/sclera: Conjunctivae normal.     Pupils: Pupils are  equal, round, and reactive to light.  Neck:     Musculoskeletal: Normal range of motion.  Pulmonary:     Effort: Pulmonary effort is normal. No respiratory distress.     Comments: Speaking in full sentences Musculoskeletal: Normal range of motion.  Skin:    Coloration: Skin is not jaundiced or pale.     Findings: No bruising, erythema, lesion or rash.  Neurological:     Mental Status: She is alert and oriented to person, place, and time. Mental status is at baseline.  Psychiatric:        Mood and Affect: Mood normal.        Behavior: Behavior normal.        Thought Content: Thought content normal.        Judgment: Judgment normal.     Results for orders placed or performed in visit on 08/28/19  UA/M w/rflx Culture, Routine   Specimen: Urine   URINE  Result Value Ref Range   Specific Gravity, UA 1.020 1.005 - 1.030   pH, UA 6.5 5.0 - 7.5   Color, UA Yellow Yellow   Appearance Ur Clear Clear   Leukocytes,UA Negative Negative   Protein,UA Negative Negative/Trace   Glucose, UA 1+ (A) Negative   Ketones, UA Negative Negative   RBC, UA Negative Negative   Bilirubin, UA Negative Negative   Urobilinogen, Ur 0.2 0.2 - 1.0 mg/dL  Nitrite, UA Negative Negative      Assessment & Plan:   Problem List Items Addressed This Visit      Endocrine   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 Feeling well. Will get her in for A1c and if doing well will stop metformin. Await results. Call with any concerns.       Relevant Orders   Bayer DCA Hb A1c Waived       Follow up plan: Return in about 3 months (around 12/18/2019).   . This visit was completed via Doximity due to the restrictions of the COVID-19 pandemic. All issues as above were discussed and addressed. Physical exam was done as above through visual confirmation on Doximity. If it was felt that the patient should be evaluated in the office, they were directed there. The  patient verbally consented to this visit. . Location of the patient: home . Location of the provider: work . Those involved with this call:  . Provider: Park Liter, DO . CMA: Tiffany Reel, CMA . Front Desk/Registration: Don Perking  . Time spent on call: 15 minutes with patient face to face via video conference. More than 50% of this time was spent in counseling and coordination of care. 23 minutes total spent in review of patient's record and preparation of their chart.

## 2019-09-20 ENCOUNTER — Other Ambulatory Visit: Payer: Self-pay

## 2019-09-20 ENCOUNTER — Other Ambulatory Visit: Payer: BC Managed Care – PPO

## 2019-09-20 DIAGNOSIS — E1122 Type 2 diabetes mellitus with diabetic chronic kidney disease: Secondary | ICD-10-CM

## 2019-09-20 DIAGNOSIS — N181 Chronic kidney disease, stage 1: Secondary | ICD-10-CM

## 2019-09-20 LAB — BAYER DCA HB A1C WAIVED: HB A1C (BAYER DCA - WAIVED): 7.8 % — ABNORMAL HIGH (ref <7.0)

## 2019-10-12 ENCOUNTER — Other Ambulatory Visit: Payer: Self-pay | Admitting: Family Medicine

## 2019-10-13 NOTE — Telephone Encounter (Signed)
Forwarding medication refill request to PCP for review. 

## 2019-10-31 ENCOUNTER — Other Ambulatory Visit: Payer: Self-pay

## 2019-10-31 DIAGNOSIS — D500 Iron deficiency anemia secondary to blood loss (chronic): Secondary | ICD-10-CM

## 2019-11-01 ENCOUNTER — Inpatient Hospital Stay: Payer: BC Managed Care – PPO | Attending: Internal Medicine

## 2019-11-01 ENCOUNTER — Encounter: Payer: Self-pay | Admitting: Internal Medicine

## 2019-11-01 ENCOUNTER — Inpatient Hospital Stay: Payer: BC Managed Care – PPO

## 2019-11-01 ENCOUNTER — Other Ambulatory Visit: Payer: Self-pay

## 2019-11-01 ENCOUNTER — Inpatient Hospital Stay: Payer: BC Managed Care – PPO | Admitting: Internal Medicine

## 2019-11-01 VITALS — BP 146/79 | HR 94 | Resp 18

## 2019-11-01 DIAGNOSIS — N920 Excessive and frequent menstruation with regular cycle: Secondary | ICD-10-CM | POA: Insufficient documentation

## 2019-11-01 DIAGNOSIS — D500 Iron deficiency anemia secondary to blood loss (chronic): Secondary | ICD-10-CM

## 2019-11-01 DIAGNOSIS — R5383 Other fatigue: Secondary | ICD-10-CM | POA: Insufficient documentation

## 2019-11-01 LAB — CBC WITH DIFFERENTIAL/PLATELET
Abs Immature Granulocytes: 0.03 10*3/uL (ref 0.00–0.07)
Basophils Absolute: 0 10*3/uL (ref 0.0–0.1)
Basophils Relative: 1 %
Eosinophils Absolute: 0.1 10*3/uL (ref 0.0–0.5)
Eosinophils Relative: 2 %
HCT: 44.9 % (ref 36.0–46.0)
Hemoglobin: 13.8 g/dL (ref 12.0–15.0)
Immature Granulocytes: 0 %
Lymphocytes Relative: 33 %
Lymphs Abs: 2.6 10*3/uL (ref 0.7–4.0)
MCH: 24.2 pg — ABNORMAL LOW (ref 26.0–34.0)
MCHC: 30.7 g/dL (ref 30.0–36.0)
MCV: 78.8 fL — ABNORMAL LOW (ref 80.0–100.0)
Monocytes Absolute: 0.7 10*3/uL (ref 0.1–1.0)
Monocytes Relative: 9 %
Neutro Abs: 4.3 10*3/uL (ref 1.7–7.7)
Neutrophils Relative %: 55 %
Platelets: 202 10*3/uL (ref 150–400)
RBC: 5.7 MIL/uL — ABNORMAL HIGH (ref 3.87–5.11)
RDW: 14.5 % (ref 11.5–15.5)
WBC: 7.9 10*3/uL (ref 4.0–10.5)
nRBC: 0 % (ref 0.0–0.2)

## 2019-11-01 MED ORDER — SODIUM CHLORIDE 0.9 % IV SOLN
Freq: Once | INTRAVENOUS | Status: AC
  Filled 2019-11-01: qty 250

## 2019-11-01 MED ORDER — IRON SUCROSE 20 MG/ML IV SOLN
200.0000 mg | Freq: Once | INTRAVENOUS | Status: AC
  Administered 2019-11-01: 15:00:00 200 mg via INTRAVENOUS
  Filled 2019-11-01: qty 10

## 2019-11-01 NOTE — Assessment & Plan Note (Addendum)
#   Iron deficiency anemia-status post IV iron-hemoglobin improved-hemoglobin 13.8.  However given continued severe fatigue-proceed with IV iron today.    # Menorrhagia-awaiting GYN appointment.  #Ongoing fatigue-unclear etiology unlikely to iron deficient anemia.  Discussed regarding checking B12 copper zinc vitamin D levels-patient states that she would have the test checked with PCP.  # # I discussed regarding Covid-19 precautions.  I reviewed the vaccine effectiveness and potential side effects in detail.  Also discussed long-term effectiveness and safety profile are unclear at this time.  I discussed December, 2020 [employer] position statement-that all patients are recommended COVID-19 vaccinations [when available]-as long as they do not have allergy to components of the vaccine.  However, I think the benefits of the vaccination outweigh the potential risks. Re: [id number] vaccination.    # DISPOSITION:  # Proceed with venofer today.   # 4 months- MD;  Cbc/bmp;possible IV venofer- Dr.B.
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
## 2019-11-25 ENCOUNTER — Other Ambulatory Visit: Payer: Self-pay | Admitting: Family Medicine

## 2019-12-15 ENCOUNTER — Ambulatory Visit: Payer: BC Managed Care – PPO | Attending: Internal Medicine

## 2019-12-15 DIAGNOSIS — Z23 Encounter for immunization: Secondary | ICD-10-CM | POA: Insufficient documentation

## 2019-12-15 NOTE — Progress Notes (Signed)
   Covid-19 Vaccination Clinic  Name:  [name]    MRN: [id number] DOB: 11/26/1975  12/15/2019  [name] was observed post Covid-19 immunization for 15 minutes without incidence. She was provided with Vaccine Information Sheet and instruction to access the V-Safe system.   [name] was instructed to call 911 with any severe reactions post vaccine: Marland Kitchen Difficulty breathing  . Swelling of your face and throat  . A fast heartbeat  . A bad rash all over your body  . Dizziness and weakness    Immunizations Administered    Name Date Dose VIS Date Route   Pfizer COVID-19 Vaccine 12/15/2019 12:35 PM 0.3 mL 09/27/2019 Intramuscular   Manufacturer: [place]   Lot: [id number]   [place]: [id number]

## 2019-12-18 ENCOUNTER — Other Ambulatory Visit: Payer: Self-pay

## 2019-12-18 ENCOUNTER — Emergency Department
Admission: EM | Admit: 2019-12-18 | Discharge: 2019-12-18 | Disposition: A | Discharge status: Home / Self Care | Payer: BC Managed Care – PPO | Attending: Emergency Medicine | Admitting: Emergency Medicine

## 2019-12-18 DIAGNOSIS — Z79899 Other long term (current) drug therapy: Secondary | ICD-10-CM | POA: Insufficient documentation

## 2019-12-18 DIAGNOSIS — I2510 Atherosclerotic heart disease of native coronary artery without angina pectoris: Secondary | ICD-10-CM | POA: Insufficient documentation

## 2019-12-18 DIAGNOSIS — Z7984 Long term (current) use of oral hypoglycemic drugs: Secondary | ICD-10-CM | POA: Diagnosis not present

## 2019-12-18 DIAGNOSIS — K602 Anal fissure, unspecified: Secondary | ICD-10-CM | POA: Diagnosis not present

## 2019-12-18 DIAGNOSIS — F17210 Nicotine dependence, cigarettes, uncomplicated: Secondary | ICD-10-CM | POA: Diagnosis not present

## 2019-12-18 DIAGNOSIS — I129 Hypertensive chronic kidney disease with stage 1 through stage 4 chronic kidney disease, or unspecified chronic kidney disease: Secondary | ICD-10-CM | POA: Diagnosis not present

## 2019-12-18 DIAGNOSIS — K625 Hemorrhage of anus and rectum: Secondary | ICD-10-CM | POA: Diagnosis not present

## 2019-12-18 DIAGNOSIS — E1122 Type 2 diabetes mellitus with diabetic chronic kidney disease: Secondary | ICD-10-CM | POA: Diagnosis not present

## 2019-12-18 DIAGNOSIS — N189 Chronic kidney disease, unspecified: Secondary | ICD-10-CM | POA: Diagnosis not present

## 2019-12-18 LAB — CBC
HCT: 40.1 % (ref 36.0–46.0)
Hemoglobin: 12.9 g/dL (ref 12.0–15.0)
MCH: 25.1 pg — ABNORMAL LOW (ref 26.0–34.0)
MCHC: 32.2 g/dL (ref 30.0–36.0)
MCV: 78 fL — ABNORMAL LOW (ref 80.0–100.0)
Platelets: 208 10*3/uL (ref 150–400)
RBC: 5.14 MIL/uL — ABNORMAL HIGH (ref 3.87–5.11)
RDW: 15.8 % — ABNORMAL HIGH (ref 11.5–15.5)
WBC: 8.6 10*3/uL (ref 4.0–10.5)
nRBC: 0 % (ref 0.0–0.2)

## 2019-12-18 LAB — COMPREHENSIVE METABOLIC PANEL
ALT: 28 U/L (ref 0–44)
AST: 25 U/L (ref 15–41)
Albumin: 4.3 g/dL (ref 3.5–5.0)
Alkaline Phosphatase: 71 U/L (ref 38–126)
Anion gap: 14 (ref 5–15)
BUN: 13 mg/dL (ref 6–20)
CO2: 21 mmol/L — ABNORMAL LOW (ref 22–32)
Calcium: 9.7 mg/dL (ref 8.9–10.3)
Chloride: 100 mmol/L (ref 98–111)
Creatinine, Ser: 0.8 mg/dL (ref 0.44–1.00)
GFR calc Af Amer: >60 mL/min (ref >60)
GFR calc non Af Amer: >60 mL/min (ref >60)
Glucose, Bld: 244 mg/dL — ABNORMAL HIGH (ref 70–99)
Potassium: 4.1 mmol/L (ref 3.5–5.1)
Sodium: 135 mmol/L (ref 135–145)
Total Bilirubin: 0.4 mg/dL (ref 0.3–1.2)
Total Protein: 7.3 g/dL (ref 6.5–8.1)

## 2019-12-18 MED ORDER — DOCUSATE SODIUM 100 MG PO CAPS: 100.0000 mg | ORAL_CAPSULE | Freq: Two times a day (BID) | ORAL | 2 refills | Status: DC

## 2019-12-18 MED ORDER — HYDROCORTISONE ACETATE 25 MG RE SUPP: 25.0000 mg | Freq: Two times a day (BID) | RECTAL | 1 refills | Status: DC

## 2019-12-18 NOTE — ED Triage Notes (Signed)
Pt states she felt like she was torn when she had a BM yesterday, not a hard stool. 

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
## 2019-12-29 ENCOUNTER — Other Ambulatory Visit: Payer: Self-pay | Admitting: Family Medicine

## 2019-12-29 NOTE — Telephone Encounter (Signed)
Requested Prescriptions  Pending Prescriptions Disp Refills  . cetirizine (ZYRTEC) 10 MG tablet [Pharmacy Med Name: CETIRIZINE 10MG  TABLETS] 90 tablet 2    Sig: TAKE 1 TABLET(10 MG) BY MOUTH DAILY     Ear, Nose, and Throat:  Antihistamines Passed - 12/29/2019 11:09 AM      Passed - Valid encounter within last 12 months    Recent Outpatient Visits          3 months ago 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, without long-term current use of insulin ([place])   [place], [place], DO   4 months ago Vaginal dryness   [place], [place]   4 months ago Right ear pain   [facility] [place], [place]   4 months ago Exposure to COVID-19 virus   [facility], [place], [place]   6 months ago Coronary artery disease involving native heart without angina pectoris, unspecified vessel or lesion type   [facility], Megan P, DO

## 2020-01-08 ENCOUNTER — Ambulatory Visit: Payer: BC Managed Care – PPO | Attending: Internal Medicine

## 2020-01-08 DIAGNOSIS — Z23 Encounter for immunization: Secondary | ICD-10-CM

## 2020-01-08 NOTE — Progress Notes (Signed)
   Covid-19 Vaccination Clinic  Name:  [name]    MRN: [id number] DOB: November 25, 1975  01/08/2020  Ms. Border was observed post Covid-19 immunization for 15 minutes without incident. She was provided with Vaccine Information Sheet and instruction to access the V-Safe system.   [name] was instructed to call 911 with any severe reactions post vaccine: Marland Kitchen Difficulty breathing  . Swelling of face and throat  . A fast heartbeat  . A bad rash all over body  . Dizziness and weakness   Immunizations Administered    Name Date Dose VIS Date Route   Pfizer COVID-19 Vaccine 01/08/2020  2:03 PM 0.3 mL 09/27/2019 Intramuscular   Manufacturer: [place]   Lot: [id number]   [place]: [id number]

## 2020-01-10 ENCOUNTER — Other Ambulatory Visit: Payer: Self-pay | Admitting: Family Medicine

## 2020-01-10 NOTE — Telephone Encounter (Signed)
LOV: 09/19/2019

## 2020-01-10 NOTE — Telephone Encounter (Signed)
Requested  medications are  due for refill today yes  Requested medications are on the active medication list yes  Last refill 2/24  Future visit scheduled no  Last visit: 09/2019  Notes to clinic Not delegated

## 2020-01-20 ENCOUNTER — Other Ambulatory Visit: Payer: Self-pay

## 2020-01-20 NOTE — Telephone Encounter (Signed)
Needs appt

## 2020-01-20 NOTE — Telephone Encounter (Signed)
Refill request for Metformin 500mg  LOV: 09/19/2019 Next Appt: none

## 2020-01-20 NOTE — Telephone Encounter (Signed)
Scheduled for 01/24/20 in person with Apolonio Schneiders due to her schedule

## 2020-01-24 ENCOUNTER — Ambulatory Visit: Payer: BC Managed Care – PPO | Admitting: Family Medicine

## 2020-01-29 ENCOUNTER — Encounter: Payer: Self-pay | Admitting: Family Medicine

## 2020-01-29 ENCOUNTER — Other Ambulatory Visit: Payer: Self-pay

## 2020-01-29 ENCOUNTER — Ambulatory Visit: Payer: BC Managed Care – PPO | Admitting: Family Medicine

## 2020-01-29 VITALS — BP 125/82 | HR 98 | Temp 98.7°F | Wt 266.0 lb

## 2020-01-29 DIAGNOSIS — N181 Chronic kidney disease, stage 1: Secondary | ICD-10-CM

## 2020-01-29 DIAGNOSIS — E1122 Type 2 diabetes mellitus with diabetic chronic kidney disease: Secondary | ICD-10-CM

## 2020-01-29 MED ORDER — METFORMIN HCL 500 MG PO TABS: ORAL_TABLET | ORAL | 1 refills | Status: DC

## 2020-01-29 NOTE — Progress Notes (Signed)
   BP 125/82   Pulse 98   Temp 98.7 F (37.1 C) (Oral)   Wt 266 lb (120.7 kg)   SpO2 95%   BMI 36.08 kg/m    Subjective:    Patient ID: [name], female    DOB: August 18, 1976, 44 y.o.   MRN: [id number]  HPI: [name] is a 44 y.o. female  Chief Complaint  Patient presents with  . Diabetes   Presenting today for DM f/u. Started a new diet where her meals are food prepped and trying to eat mostly whole, natural foods. Not exercising regularly but trying to be more active. Denies side effects to metformin. Currently taking once daily 500 mg dose. No low blood sugar spells.   Relevant past medical, surgical, family and social history reviewed and updated as indicated. Interim medical history since our last visit reviewed. Allergies and medications reviewed and updated.  Review of Systems  Per HPI unless specifically indicated above     Objective:    BP 125/82   Pulse 98   Temp 98.7 F (37.1 C) (Oral)   Wt 266 lb (120.7 kg)   SpO2 95%   BMI 36.08 kg/m   Wt Readings from Last 3 Encounters:  01/29/20 266 lb (120.7 kg)  12/18/19 280 lb (127 kg)  09/20/19 272 lb (123.4 kg)    Physical Exam Vitals and nursing note reviewed.  Constitutional:      Appearance: Normal appearance. She is not ill-appearing.  HENT:     Head: Atraumatic.  Eyes:     Extraocular Movements: Extraocular movements intact.     Conjunctiva/sclera: Conjunctivae normal.  Cardiovascular:     Rate and Rhythm: Normal rate and regular rhythm.     Heart sounds: Normal heart sounds.  Pulmonary:     Effort: Pulmonary effort is normal.     Breath sounds: Normal breath sounds.  Musculoskeletal:        General: Normal range of motion.     Cervical back: Normal range of motion and neck supple.  Skin:    General: Skin is warm and dry.  Neurological:     Mental Status: She is alert and oriented to person, place, and time.  Psychiatric:        Mood and Affect: Mood normal.        Thought  Content: Thought content normal.        Judgment: Judgment normal.     Results for orders placed or performed in visit on 01/29/20  HgB A1c  Result Value Ref Range   Hgb A1c MFr Bld 9.0 (H) 4.8 - 5.6 %   Est. 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 Continue diet changes, increase exercise, continue metformin regimen      Relevant Medications   metFORMIN (GLUCOPHAGE) 500 MG tablet   Other Relevant Orders   HgB A1c (Completed)       Follow up plan: Return in about 3 months (around 04/29/2020) for 6 month f/u.

## 2020-01-30 ENCOUNTER — Encounter: Payer: Self-pay | Admitting: Family Medicine

## 2020-01-30 LAB — HEMOGLOBIN A1C
Est. average glucose Bld gHb Est-mCnc: 212 mg/dL
Hgb A1c MFr Bld: 9 % — ABNORMAL HIGH (ref 4.8–5.6)

## 2020-02-03 NOTE — Assessment & Plan Note (Signed)
Recheck A1C, adjust as needed. Continue diet changes, increase exercise, continue metformin regimen

## 2020-02-27 ENCOUNTER — Other Ambulatory Visit: Payer: Self-pay

## 2020-02-27 NOTE — Progress Notes (Signed)
Spoke with patient- prescreening prior to apt with MD on 5/14. Patient report no covid related exposures/symptoms. 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

## 2020-02-28 ENCOUNTER — Encounter: Payer: Self-pay | Admitting: *Deleted

## 2020-02-28 ENCOUNTER — Inpatient Hospital Stay: Payer: BC Managed Care – PPO | Attending: Internal Medicine

## 2020-02-28 ENCOUNTER — Inpatient Hospital Stay: Payer: BC Managed Care – PPO | Admitting: Internal Medicine

## 2020-02-28 ENCOUNTER — Inpatient Hospital Stay: Payer: BC Managed Care – PPO

## 2020-02-28 DIAGNOSIS — R5383 Other fatigue: Secondary | ICD-10-CM | POA: Diagnosis not present

## 2020-02-28 DIAGNOSIS — D500 Iron deficiency anemia secondary to blood loss (chronic): Secondary | ICD-10-CM

## 2020-02-28 DIAGNOSIS — D509 Iron deficiency anemia, unspecified: Secondary | ICD-10-CM | POA: Diagnosis not present

## 2020-02-28 DIAGNOSIS — M791 Myalgia, unspecified site: Secondary | ICD-10-CM | POA: Diagnosis not present

## 2020-02-28 LAB — CBC WITH DIFFERENTIAL/PLATELET
Abs Immature Granulocytes: 0.04 10*3/uL (ref 0.00–0.07)
Basophils Absolute: 0 10*3/uL (ref 0.0–0.1)
Basophils Relative: 0 %
Eosinophils Absolute: 0.4 10*3/uL (ref 0.0–0.5)
Eosinophils Relative: 4 %
HCT: 42.4 % (ref 36.0–46.0)
Hemoglobin: 13.5 g/dL (ref 12.0–15.0)
Immature Granulocytes: 0 %
Lymphocytes Relative: 33 %
Lymphs Abs: 3 10*3/uL (ref 0.7–4.0)
MCH: 24.2 pg — ABNORMAL LOW (ref 26.0–34.0)
MCHC: 31.8 g/dL (ref 30.0–36.0)
MCV: 76.1 fL — ABNORMAL LOW (ref 80.0–100.0)
Monocytes Absolute: 0.6 10*3/uL (ref 0.1–1.0)
Monocytes Relative: 7 %
Neutro Abs: 5 10*3/uL (ref 1.7–7.7)
Neutrophils Relative %: 56 %
Platelets: 234 10*3/uL (ref 150–400)
RBC: 5.57 MIL/uL — ABNORMAL HIGH (ref 3.87–5.11)
RDW: 15.1 % (ref 11.5–15.5)
WBC: 9 10*3/uL (ref 4.0–10.5)
nRBC: 0 % (ref 0.0–0.2)

## 2020-02-28 LAB — BASIC METABOLIC PANEL
Anion gap: 10 (ref 5–15)
BUN: 11 mg/dL (ref 6–20)
CO2: 25 mmol/L (ref 22–32)
Calcium: 9.4 mg/dL (ref 8.9–10.3)
Chloride: 103 mmol/L (ref 98–111)
Creatinine, Ser: 0.86 mg/dL (ref 0.44–1.00)
GFR calc Af Amer: >60 mL/min (ref >60)
GFR calc non Af Amer: >60 mL/min (ref >60)
Glucose, Bld: 186 mg/dL — ABNORMAL HIGH (ref 70–99)
Potassium: 4.4 mmol/L (ref 3.5–5.1)
Sodium: 138 mmol/L (ref 135–145)

## 2020-02-28 LAB — FERRITIN: Ferritin: 14 ng/mL (ref 11–307)

## 2020-02-28 LAB — IRON AND TIBC
Iron: 31 ug/dL (ref 28–170)
Saturation Ratios: 7 % — ABNORMAL LOW (ref 10.4–31.8)
TIBC: 427 ug/dL (ref 250–450)
UIBC: 396 ug/dL
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
## 2020-02-28 NOTE — Assessment & Plan Note (Addendum)
#   Iron deficiency anemia-status post IV iron-hemoglobin improved-hemoglobin 13.8.  Symptoms improved post IV iron infusion.  Today hemoglobin is 13.  Following the infusion today.  #History of menorrhagia-overall stable.  Follow-up with GYN  #Ongoing fatigue/myalgias- reocmmend vit D.   # DISPOSITION: will inform pt/mcyhart.  # HOLD venofer  # 4 months- MD;  Cbc/bmp;possible IV venofer- Dr.B.   Addendum: Iron studies show saturation of 7%-ferritin 14; patient will be informed-regarding possible need for IV iron.

## 2020-02-28 NOTE — Patient Instructions (Signed)
Take vit D-3:  5000 IU/day

## 2020-03-01 ENCOUNTER — Other Ambulatory Visit: Payer: Self-pay | Admitting: Family Medicine

## 2020-03-01 ENCOUNTER — Telehealth: Payer: Self-pay | Admitting: Internal Medicine

## 2020-03-01 NOTE — Telephone Encounter (Signed)
Requested Prescriptions  Pending Prescriptions Disp Refills  . pantoprazole (PROTONIX) 40 MG tablet [Pharmacy Med Name: PANTOPRAZOLE 40MG  TABLETS] 180 tablet 2    Sig: TAKE 1 TABLET(40 MG) BY MOUTH TWICE DAILY     Gastroenterology: Proton Pump Inhibitors Passed - 03/01/2020 10:47 AM      Passed - Valid encounter within last 12 months    Recent Outpatient Visits          1 month ago 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, without long-term current use of insulin [facility])   [facility], [place], [place]   5 months ago 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, without long-term current use of insulin [facility])   [place], [place], DO   6 months ago Vaginal dryness   [place], [place]   6 months ago Right ear pain   [place], [place]   6 months ago Exposure to COVID-19 virus   [facility], [place], [place]

## 2020-03-01 NOTE — Telephone Encounter (Signed)
MyChart message:  hi Ms. Border-your iron levels are slightly on the lower side but not terrible enough that I would recommend iron infusion at this time; especially the hemoglobin is holding up with at 13.  I would recommend trial of vitamin D as recommended.  If symptoms not improved-then I would recommend iron infusion.  Please call us if any questions or concerns. Thank you Dr. Jacinto Reap

## 2020-03-04 ENCOUNTER — Other Ambulatory Visit: Payer: Self-pay | Admitting: Family Medicine

## 2020-03-04 NOTE — Telephone Encounter (Signed)
Requested Prescriptions  Pending Prescriptions Disp Refills  . lisinopril (ZESTRIL) 5 MG tablet [Pharmacy Med Name: LISINOPRIL 5MG  TABLETS] 90 tablet 1    Sig: TAKE 1 TABLET(5 MG) BY MOUTH DAILY     Cardiovascular:  ACE Inhibitors Passed - 03/04/2020  3:28 AM      Passed - Cr in normal range and within 180 days    Creatinine  Date Value Ref Range Status  04/09/2014 0.89 0.60 - 1.30 mg/dL Final   Creatinine, Ser  Date Value Ref Range Status  02/28/2020 0.86 0.44 - 1.00 mg/dL Final         Passed - K in normal range and within 180 days    Potassium  Date Value Ref Range Status  02/28/2020 4.4 3.5 - 5.1 mmol/L Final  04/09/2014 4.2 3.5 - 5.1 mmol/L Final         Passed - Patient is not pregnant      Passed - Last BP in normal range    BP Readings from Last 1 Encounters:  02/28/20 128/84         Passed - Valid encounter within last 6 months    Recent Outpatient Visits          1 month ago 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, without long-term current use of insulin [facility])   [place], [place], PA-C   5 months ago 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, without long-term current use of insulin ([place])   [place], [place], DO   6 months ago Vaginal dryness   [place], [place]   6 months ago Right ear pain   [facility] [place], [place]   6 months ago Exposure to COVID-19 virus   [facility], [place], [place]

## 2020-03-16 ENCOUNTER — Encounter: Payer: Self-pay | Admitting: Family Medicine

## 2020-03-18 ENCOUNTER — Telehealth: Payer: Self-pay | Admitting: Family Medicine

## 2020-03-18 NOTE — Telephone Encounter (Signed)
Requested medication (s) are due for refill today: yes ° °Requested medication (s) are on the active medication list: yes ° °Last refill:  01/29/2020 ° °Future visit scheduled: no ° °Notes to clinic: result note for lab from 01/29/20 reports dosage to change once patient completed previous Rx.. Rachel Lane recommended to increase dose to  metformin 500mg BID and patient reported she is taking 1000mg QD. Please clarify order. ° ° °  °Requested Prescriptions  °Pending Prescriptions Disp Refills  ° metFORMIN (GLUCOPHAGE) 500 MG tablet 90 tablet 1  °  Sig: TAKE 1 TABLET(500 MG) BY MOUTH DAILY WITH BREAKFAST  °   ° Endocrinology:  Diabetes - Biguanides Failed - 03/18/2020  3:10 PM  °  °  Failed - HBA1C is between 0 and 7.9 and within 180 days  °  HB A1C (BAYER DCA - WAIVED)  °Date Value Ref Range Status  °09/20/2019 7.8 (H) <7.0 % Final  °  Comment:  °                                        Diabetic Adult            <7.0 °                                      Healthy Adult        4.3 - 5.7 °                                                          (DCCT/NGSP) °American Diabetes Association's Summary of Glycemic Recommendations °for Adults with Diabetes: Hemoglobin A1c <7.0%. More stringent °glycemic goals (A1c <6.0%) may further reduce complications at the °cost of increased risk of hypoglycemia. °  ° °Hgb A1c MFr Bld  °Date Value Ref Range Status  °01/29/2020 9.0 (H) 4.8 - 5.6 % Final  °  Comment:  °           Prediabetes: 5.7 - 6.4 °         Diabetes: >6.4 °         Glycemic control for adults with diabetes: <7.0 °  °  °  °  °  Passed - Cr in normal range and within 360 days  °  Creatinine  °Date Value Ref Range Status  °04/09/2014 0.89 0.60 - 1.30 mg/dL Final  ° °Creatinine, Ser  °Date Value Ref Range Status  °02/28/2020 0.86 0.44 - 1.00 mg/dL Final  °  °  °  °  Passed - eGFR in normal range and within 360 days  °  EGFR (African American)  °Date Value Ref Range Status  °04/09/2014 >60  Final  ° °GFR calc Af Amer  °Date  Value Ref Range Status  °02/28/2020 >60 >60 mL/min Final  ° °EGFR (Non-African Amer.)  °Date Value Ref Range Status  °04/09/2014 >60  Final  °  Comment:  °  eGFR values <60mL/min/1.73 m2 may be an indication of chronic °kidney disease (CKD). °Calculated eGFR is useful in patients with stable renal function. °The eGFR calculation will not be reliable in acutely ill patients °when serum creatinine   is changing rapidly. It is not useful in  patients on dialysis. The eGFR calculation may not be applicable to patients at the low and high extremes of body sizes, pregnant women, and vegetarians.    GFR calc non Af Amer  Date Value Ref Range Status  02/28/2020 >60 >60 mL/min Final          Passed - Valid encounter within last 6 months    Recent Outpatient Visits           1 month ago 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, without long-term current use of insulin [facility])   [facility], [place], [place]   6 months ago 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, without long-term current use of insulin [facility])   [place], [place], DO   6 months ago Vaginal dryness   [place], [place]   7 months ago Right ear pain   [place], [place]   7 months ago Exposure to COVID-19 virus   [facility], [place], [place]

## 2020-03-18 NOTE — Telephone Encounter (Signed)
Routing to provider for clarification. See [facility] nurse message below.

## 2020-03-18 NOTE — Telephone Encounter (Signed)
Medication Refill - Medication: metFORMIN (GLUCOPHAGE) 500 MG tablet   Preferred Pharmacy (with phone number or street name):  [facility] [id number], [place] Phone:  [phone]  Fax:  [phone]       Agent: Please be advised that RX refills may take up to 3 business days. We ask that you follow-up with your pharmacy.

## 2020-03-19 MED ORDER — METFORMIN HCL 500 MG PO TABS: ORAL_TABLET | ORAL | 1 refills | Status: DC

## 2020-03-19 NOTE — Telephone Encounter (Signed)
Patient notified of Dr. Durenda Age message. Patient stated that the insurance rejected the refill for metforming stating that is too soon for a new refill. Pt states as she was taking it twice a day, she  run out of medication to soon. Stated she only have until tomorrow so she scheduled an OV for tomorrow morning. FYI

## 2020-03-19 NOTE — Telephone Encounter (Signed)
Please let her know that with the bad reaction she's having to the metformin and the weight loss, I want her to go back to the lower dose and come see me so we can see what we can do to get her feeling better without the stomach aches and diarrhea.

## 2020-03-19 NOTE — Telephone Encounter (Signed)
Pt called in for assistance. Pt states that Rachael changed her Metformin Rx to twice a day instead of once. Pt would like to have Rx updated/corrected and sent in to pharmacy below   Pharmacy: [facility], [place]  Phone:  [phone] Fax:  [phone]

## 2020-03-20 ENCOUNTER — Other Ambulatory Visit: Payer: Self-pay

## 2020-03-20 ENCOUNTER — Ambulatory Visit (INDEPENDENT_AMBULATORY_CARE_PROVIDER_SITE_OTHER): Payer: BC Managed Care – PPO | Admitting: Family Medicine

## 2020-03-20 ENCOUNTER — Encounter: Payer: Self-pay | Admitting: Family Medicine

## 2020-03-20 VITALS — BP 127/81 | HR 80 | Temp 97.9°F | Ht 69.29 in | Wt 246.0 lb

## 2020-03-20 DIAGNOSIS — N181 Chronic kidney disease, stage 1: Secondary | ICD-10-CM | POA: Diagnosis not present

## 2020-03-20 DIAGNOSIS — I2510 Atherosclerotic heart disease of native coronary artery without angina pectoris: Secondary | ICD-10-CM | POA: Diagnosis not present

## 2020-03-20 DIAGNOSIS — E1122 Type 2 diabetes mellitus with diabetic chronic kidney disease: Secondary | ICD-10-CM | POA: Diagnosis not present

## 2020-03-20 DIAGNOSIS — D500 Iron deficiency anemia secondary to blood loss (chronic): Secondary | ICD-10-CM

## 2020-03-20 LAB — BAYER DCA HB A1C WAIVED: HB A1C (BAYER DCA - WAIVED): 6.8 % (ref <7.0)

## 2020-03-20 MED ORDER — NITROGLYCERIN 0.4 MG SL SUBL: 0.4000 mg | SUBLINGUAL_TABLET | SUBLINGUAL | 12 refills | Status: DC | PRN

## 2020-03-20 MED ORDER — FERROUS SULFATE 325 (65 FE) MG PO TABS: 325.0000 mg | ORAL_TABLET | Freq: Three times a day (TID) | ORAL | 6 refills | Status: DC

## 2020-03-20 MED ORDER — METFORMIN HCL 500 MG PO TABS: 500.0000 mg | ORAL_TABLET | Freq: Two times a day (BID) | ORAL | 1 refills | Status: DC

## 2020-03-20 MED ORDER — EMPAGLIFLOZIN 25 MG PO TABS: 25.0000 mg | ORAL_TABLET | Freq: Every day | ORAL | 3 refills | Status: DC

## 2020-03-20 MED ORDER — TRAZODONE HCL 100 MG PO TABS: ORAL_TABLET | ORAL | 6 refills | Status: DC

## 2020-03-20 NOTE — Progress Notes (Signed)
BP 127/81 (BP Location: Left Arm, Patient Position: Sitting, Cuff Size: Normal)   Pulse 80   Temp 97.9 F (36.6 C) (Oral)   Ht 5' 9.29" (1.76 m)   Wt 246 lb (111.6 kg)   LMP 03/06/2020 (Exact Date)   SpO2 100%   BMI 36.02 kg/m    Subjective:    Patient ID: [name], female    DOB: [birth year], 44 y.o.   MRN: [id number]  HPI: [name] is a 44 y.o. female  Chief Complaint  Patient presents with  . Diabetes    refill on metformin  . Heart Problem    refill on nitroglycerin  . iron    saturation of iron is low 7% with exhaustion, could this be related to b12? referral?   DIABETES- has been exercising and eating right. Has been doing well. Feeling great. Emotionally happy! Planning on getting engaged soon! Was planning on takign the 2 of the metformin but has been having horrible diarrhea since then and has not been able to tolerate it.  Hypoglycemic episodes:no Polydipsia/polyuria: no Visual disturbance: no Chest pain: no Paresthesias: no Glucose Monitoring: yes Taking Insulin?: no Blood Pressure Monitoring: not checking Retinal Examination: Not up to Date Foot Exam: Up to Date Diabetic Education: Completed Pneumovax: Up to Date Influenza: Up to Date Aspirin: yes   ANEMIA- was not happy with her last appointment with hematology. Would like to get a 2nd opinion Anemia status: uncontrolled Etiology of anemia: Iron def Compliance with treatment: excellent compliance Iron supplementation side effects: yes Severity of anemia: severe Fatigue: yes Decreased exercise tolerance: yes  Dyspnea on exertion: yes Palpitations: yes Bleeding: no Pica: no   Relevant past medical, surgical, family and social history reviewed and updated as indicated. Interim medical history since our last visit reviewed. Allergies and medications reviewed and updated.  Review of Systems  Constitutional: Negative.   Respiratory: Negative.   Cardiovascular: Negative.     Gastrointestinal: Positive for abdominal pain and diarrhea. Negative for abdominal distention, anal bleeding, blood in stool, constipation, nausea, rectal pain and vomiting.  Musculoskeletal: Negative.   Psychiatric/Behavioral: Negative.     Per HPI unless specifically indicated above     Objective:    BP 127/81 (BP Location: Left Arm, Patient Position: Sitting, Cuff Size: Normal)   Pulse 80   Temp 97.9 F (36.6 C) (Oral)   Ht 5' 9.29" (1.76 m)   Wt 246 lb (111.6 kg)   LMP 03/06/2020 (Exact Date)   SpO2 100%   BMI 36.02 kg/m   Wt Readings from Last 3 Encounters:  03/20/20 246 lb (111.6 kg)  02/28/20 254 lb (115.2 kg)  01/29/20 266 lb (120.7 kg)    Physical Exam Vitals and nursing note reviewed.  Constitutional:      General: She is not in acute distress.    Appearance: Normal appearance. She is not ill-appearing, toxic-appearing or diaphoretic.  HENT:     Head: Normocephalic and atraumatic.     Right Ear: External ear normal.     Left Ear: External ear normal.     Nose: Nose normal.     Mouth/Throat:     Mouth: Mucous membranes are moist.     Pharynx: Oropharynx is clear.  Eyes:     General: No scleral icterus.       Right eye: No discharge.        Left eye: No discharge.     Extraocular Movements: Extraocular movements intact.  Conjunctiva/sclera: Conjunctivae normal.     Pupils: Pupils are equal, round, and reactive to light.  Cardiovascular:     Rate and Rhythm: Normal rate and regular rhythm.     Pulses: Normal pulses.     Heart sounds: Normal heart sounds. No murmur. No friction rub. No gallop.   Pulmonary:     Effort: Pulmonary effort is normal. No respiratory distress.     Breath sounds: Normal breath sounds. No stridor. No wheezing, rhonchi or rales.  Chest:     Chest wall: No tenderness.  Musculoskeletal:        General: Normal range of motion.     Cervical back: Normal range of motion and neck supple.  Skin:    General: Skin is warm and dry.      Capillary Refill: Capillary refill takes less than 2 seconds.     Coloration: Skin is not jaundiced or pale.     Findings: No bruising, erythema, lesion or rash.  Neurological:     General: No focal deficit present.     Mental Status: She is alert and oriented to person, place, and time. Mental status is at baseline.  Psychiatric:        Mood and Affect: Mood normal.        Behavior: Behavior normal.        Thought Content: Thought content normal.        Judgment: Judgment normal.     Results for orders placed or performed in visit on 03/20/20  Bayer DCA Hb A1c Waived  Result Value Ref Range   HB A1C (BAYER DCA - WAIVED) 6.8 <7.0 %  Comprehensive metabolic panel  Result Value Ref Range   Glucose 106 (H) 65 - 99 mg/dL   BUN 13 6 - 24 mg/dL   Creatinine, Ser 0.85 0.57 - 1.00 mg/dL   GFR calc non Af Amer 85 >59 mL/min/1.73   GFR calc Af Amer 98 >59 mL/min/1.73   BUN/Creatinine Ratio 15 9 - 23   Sodium 139 134 - 144 mmol/L   Potassium 4.1 3.5 - 5.2 mmol/L   Chloride 103 96 - 106 mmol/L   CO2 23 20 - 29 mmol/L   Calcium 9.8 8.7 - 10.2 mg/dL   Total Protein 7.2 6.0 - 8.5 g/dL   Albumin 4.7 3.8 - 4.8 g/dL   Globulin, Total 2.5 1.5 - 4.5 g/dL   Albumin/Globulin Ratio 1.9 1.2 - 2.2   Bilirubin Total 0.4 0.0 - 1.2 mg/dL   Alkaline Phosphatase 91 48 - 121 IU/L   AST 22 0 - 40 IU/L   ALT 29 0 - 32 IU/L  Iron and TIBC  Result Value Ref Range   Total Iron Binding Capacity 375 250 - 450 ug/dL   UIBC 340 131 - 425 ug/dL   Iron 35 27 - 159 ug/dL   Iron Saturation 9 (LL) 15 - 55 %  CBC with Differential/Platelet  Result Value Ref Range   WBC 6.7 3.4 - 10.8 x10E3/uL   RBC 5.62 (H) 3.77 - 5.28 x10E6/uL   Hemoglobin 13.2 11.1 - 15.9 g/dL   Hematocrit 43.5 34.0 - 46.6 %   MCV 77 (L) 79 - 97 fL   MCH 23.5 (L) 26.6 - 33.0 pg   MCHC 30.3 (L) 31.5 - 35.7 g/dL   RDW 15.3 11.7 - 15.4 %   Platelets 193 150 - 450 x10E3/uL   Neutrophils 56 Not Estab. %   Lymphs 33 Not Estab. %  Monocytes 7 Not Estab. %   Eos 3 Not Estab. %   Basos 1 Not Estab. %   Neutrophils Absolute 3.8 1.4 - 7.0 x10E3/uL   Lymphocytes Absolute 2.2 0.7 - 3.1 x10E3/uL   Monocytes Absolute 0.5 0.1 - 0.9 x10E3/uL   EOS (ABSOLUTE) 0.2 0.0 - 0.4 x10E3/uL   Basophils Absolute 0.1 0.0 - 0.2 x10E3/uL   Immature Granulocytes 0 Not Estab. %   Immature Grans (Abs) 0.0 0.0 - 0.1 x10E3/uL  Ferritin  Result Value Ref Range   Ferritin 16 15 - 150 ng/mL  B12  Result Value Ref Range   Vitamin B-12 >2000 (H) 232 - 1245 pg/mL  Lipid Panel w/o Chol/HDL Ratio  Result Value Ref Range   Cholesterol, Total 116 100 - 199 mg/dL   Triglycerides 111 0 - 149 mg/dL   HDL 29 (L) >39 mg/dL   VLDL Cholesterol Cal 21 5 - 40 mg/dL   LDL Chol Calc (NIH) 66 0 - 99 mg/dL      Assessment & Plan:   Problem List Items Addressed This Visit      Cardiovascular and Mediastinum   CAD (coronary artery disease)    Will keep BP, cholesterol and sugars under good control. Continue to monitor. Await results.       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 Will cut back to 1 pill a day and add jardiance. Recheck 3 months. Call with any concerns.       Relevant Medications   empagliflozin (JARDIANCE) 25 MG TABS tablet   metFORMIN (GLUCOPHAGE) 500 MG tablet   Other Relevant Orders   Bayer DCA Hb A1c Waived (Completed)   Comprehensive metabolic panel (Completed)   Lipid Panel w/o Chol/HDL Ratio (Completed)     Other   Iron deficiency anemia due to chronic blood loss    Rechecking labs today. Await results. Treat as needed. Would like 2nd opinion from hematology. Referral generated today. Continue to monitor.       Relevant Medications   cyanocobalamin 1000 MCG tablet   ferrous sulfate (FERROUSUL)  325 (65 FE) MG tablet   Other Relevant Orders   Comprehensive metabolic panel (Completed)   Iron and TIBC (Completed)   CBC with Differential/Platelet (Completed)   Ferritin (Completed)   B12 (Completed)   Ambulatory referral to Hematology       Follow up plan: Return in about 3 months (around 06/20/2020).

## 2020-03-21 LAB — CBC WITH DIFFERENTIAL/PLATELET
Basophils Absolute: 0.1 10*3/uL (ref 0.0–0.2)
Basos: 1 %
EOS (ABSOLUTE): 0.2 10*3/uL (ref 0.0–0.4)
Eos: 3 %
Hematocrit: 43.5 % (ref 34.0–46.6)
Hemoglobin: 13.2 g/dL (ref 11.1–15.9)
Immature Grans (Abs): 0 10*3/uL (ref 0.0–0.1)
Immature Granulocytes: 0 %
Lymphocytes Absolute: 2.2 10*3/uL (ref 0.7–3.1)
Lymphs: 33 %
MCH: 23.5 pg — ABNORMAL LOW (ref 26.6–33.0)
MCHC: 30.3 g/dL — ABNORMAL LOW (ref 31.5–35.7)
MCV: 77 fL — ABNORMAL LOW (ref 79–97)
Monocytes Absolute: 0.5 10*3/uL (ref 0.1–0.9)
Monocytes: 7 %
Neutrophils Absolute: 3.8 10*3/uL (ref 1.4–7.0)
Neutrophils: 56 %
Platelets: 193 10*3/uL (ref 150–450)
RBC: 5.62 x10E6/uL — ABNORMAL HIGH (ref 3.77–5.28)
RDW: 15.3 % (ref 11.7–15.4)
WBC: 6.7 10*3/uL (ref 3.4–10.8)

## 2020-03-21 LAB — COMPREHENSIVE METABOLIC PANEL
ALT: 29 IU/L (ref 0–32)
AST: 22 IU/L (ref 0–40)
Albumin/Globulin Ratio: 1.9 (ref 1.2–2.2)
Albumin: 4.7 g/dL (ref 3.8–4.8)
Alkaline Phosphatase: 91 IU/L (ref 48–121)
BUN/Creatinine Ratio: 15 (ref 9–23)
BUN: 13 mg/dL (ref 6–24)
Bilirubin Total: 0.4 mg/dL (ref 0.0–1.2)
CO2: 23 mmol/L (ref 20–29)
Calcium: 9.8 mg/dL (ref 8.7–10.2)
Chloride: 103 mmol/L (ref 96–106)
Creatinine, Ser: 0.85 mg/dL (ref 0.57–1.00)
GFR calc Af Amer: 98 mL/min/{1.73_m2} (ref >59)
GFR calc non Af Amer: 85 mL/min/{1.73_m2} (ref >59)
Globulin, Total: 2.5 g/dL (ref 1.5–4.5)
Glucose: 106 mg/dL — ABNORMAL HIGH (ref 65–99)
Potassium: 4.1 mmol/L (ref 3.5–5.2)
Sodium: 139 mmol/L (ref 134–144)
Total Protein: 7.2 g/dL (ref 6.0–8.5)

## 2020-03-21 LAB — IRON AND TIBC
Iron Saturation: 9 % — CL (ref 15–55)
Iron: 35 ug/dL (ref 27–159)
Total Iron Binding Capacity: 375 ug/dL (ref 250–450)
UIBC: 340 ug/dL (ref 131–425)

## 2020-03-21 LAB — LIPID PANEL W/O CHOL/HDL RATIO
Cholesterol, Total: 116 mg/dL (ref 100–199)
HDL: 29 mg/dL — ABNORMAL LOW (ref >39)
LDL Chol Calc (NIH): 66 mg/dL (ref 0–99)
Triglycerides: 111 mg/dL (ref 0–149)
VLDL Cholesterol Cal: 21 mg/dL (ref 5–40)

## 2020-03-21 LAB — VITAMIN B12: Vitamin B-12: >2000 pg/mL — ABNORMAL HIGH (ref 232–1245)

## 2020-03-21 LAB — FERRITIN: Ferritin: 16 ng/mL (ref 15–150)

## 2020-03-22 ENCOUNTER — Encounter: Payer: Self-pay | Admitting: Family Medicine

## 2020-03-22 MED ORDER — METFORMIN HCL 500 MG PO TABS: 500.0000 mg | ORAL_TABLET | Freq: Every day | ORAL | 1 refills | Status: DC

## 2020-03-22 NOTE — Assessment & Plan Note (Signed)
Will keep BP, cholesterol and sugars under good control. Continue to monitor. Await results.

## 2020-03-22 NOTE — Assessment & Plan Note (Signed)
Not tolerating her metformin well. Will cut back to 1 pill a day and add jardiance. Recheck 3 months. Call with any concerns.

## 2020-03-22 NOTE — Assessment & Plan Note (Signed)
Rechecking labs today. Await results. Treat as needed. Would like 2nd opinion from hematology. Referral generated today. Continue to monitor.

## 2020-04-28 ENCOUNTER — Encounter: Payer: Self-pay | Admitting: Family Medicine

## 2020-04-29 ENCOUNTER — Other Ambulatory Visit: Payer: Self-pay

## 2020-04-29 ENCOUNTER — Ambulatory Visit (INDEPENDENT_AMBULATORY_CARE_PROVIDER_SITE_OTHER): Payer: BC Managed Care – PPO

## 2020-04-29 DIAGNOSIS — Z23 Encounter for immunization: Secondary | ICD-10-CM

## 2020-05-15 ENCOUNTER — Other Ambulatory Visit: Payer: Self-pay | Admitting: Family Medicine

## 2020-05-15 NOTE — Telephone Encounter (Signed)
Called patient and she stated that pharmacy was working on automated refills and she got her medication now.

## 2020-05-15 NOTE — Telephone Encounter (Signed)
Requested  medications are  due for refill today yes  Requested medications are on the active medication list yes  Last refill 6/27  Last visit Can not find one within 12 months that addresses this med   Future visit scheduled 8/11  Notes to clinic Failed protocol of valid visit within 6 months

## 2020-05-15 NOTE — Telephone Encounter (Signed)
6 months sent in in June. Should not be due

## 2020-05-27 ENCOUNTER — Encounter: Payer: Self-pay | Admitting: Family Medicine

## 2020-05-27 ENCOUNTER — Other Ambulatory Visit: Payer: Self-pay

## 2020-05-27 ENCOUNTER — Ambulatory Visit: Payer: BC Managed Care – PPO | Admitting: Family Medicine

## 2020-05-27 VITALS — BP 113/74 | HR 87 | Temp 98.5°F | Wt 232.2 lb

## 2020-05-27 DIAGNOSIS — I2510 Atherosclerotic heart disease of native coronary artery without angina pectoris: Secondary | ICD-10-CM

## 2020-05-27 DIAGNOSIS — Z01818 Encounter for other preprocedural examination: Secondary | ICD-10-CM | POA: Diagnosis not present

## 2020-05-27 MED ORDER — TRAZODONE HCL 150 MG PO TABS: ORAL_TABLET | ORAL | 6 refills | Status: DC
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
## 2020-05-27 NOTE — Progress Notes (Signed)
NSR at 78bpm, no ST segment changes

## 2020-05-27 NOTE — Assessment & Plan Note (Signed)
Has not seen her cardiologist in years. Scheduled for hysterectomy. Had to use her nitro about 2 weeks ago. Will get her back into cardiology for cardiac clearance prior to her surgery. Await their input.

## 2020-05-28 LAB — COMPREHENSIVE METABOLIC PANEL
ALT: 19 IU/L (ref 0–32)
AST: 17 IU/L (ref 0–40)
Albumin/Globulin Ratio: 1.8 (ref 1.2–2.2)
Albumin: 4.6 g/dL (ref 3.8–4.8)
Alkaline Phosphatase: 77 IU/L (ref 48–121)
BUN/Creatinine Ratio: 18 (ref 9–23)
BUN: 16 mg/dL (ref 6–24)
Bilirubin Total: 0.2 mg/dL (ref 0.0–1.2)
CO2: 20 mmol/L (ref 20–29)
Calcium: 9.7 mg/dL (ref 8.7–10.2)
Chloride: 104 mmol/L (ref 96–106)
Creatinine, Ser: 0.88 mg/dL (ref 0.57–1.00)
GFR calc Af Amer: 94 mL/min/{1.73_m2} (ref >59)
GFR calc non Af Amer: 81 mL/min/{1.73_m2} (ref >59)
Globulin, Total: 2.6 g/dL (ref 1.5–4.5)
Glucose: 106 mg/dL — ABNORMAL HIGH (ref 65–99)
Potassium: 4.5 mmol/L (ref 3.5–5.2)
Sodium: 138 mmol/L (ref 134–144)
Total Protein: 7.2 g/dL (ref 6.0–8.5)

## 2020-05-28 LAB — CBC WITH DIFFERENTIAL/PLATELET
Basophils Absolute: 0 10*3/uL (ref 0.0–0.2)
Basos: 0 %
EOS (ABSOLUTE): 0.1 10*3/uL (ref 0.0–0.4)
Eos: 2 %
Hematocrit: 45.5 % (ref 34.0–46.6)
Hemoglobin: 13.8 g/dL (ref 11.1–15.9)
Immature Grans (Abs): 0 10*3/uL (ref 0.0–0.1)
Immature Granulocytes: 0 %
Lymphocytes Absolute: 2.7 10*3/uL (ref 0.7–3.1)
Lymphs: 28 %
MCH: 22.6 pg — ABNORMAL LOW (ref 26.6–33.0)
MCHC: 30.3 g/dL — ABNORMAL LOW (ref 31.5–35.7)
MCV: 75 fL — ABNORMAL LOW (ref 79–97)
Monocytes Absolute: 0.7 10*3/uL (ref 0.1–0.9)
Monocytes: 8 %
Neutrophils Absolute: 5.8 10*3/uL (ref 1.4–7.0)
Neutrophils: 62 %
Platelets: 261 10*3/uL (ref 150–450)
RBC: 6.11 x10E6/uL — ABNORMAL HIGH (ref 3.77–5.28)
RDW: 15.6 % — ABNORMAL HIGH (ref 11.7–15.4)
WBC: 9.5 10*3/uL (ref 3.4–10.8)

## 2020-05-29 ENCOUNTER — Other Ambulatory Visit: Payer: Self-pay | Admitting: Student

## 2020-05-29 DIAGNOSIS — I25119 Atherosclerotic heart disease of native coronary artery with unspecified angina pectoris: Secondary | ICD-10-CM

## 2020-05-29 DIAGNOSIS — Z01818 Encounter for other preprocedural examination: Secondary | ICD-10-CM

## 2020-05-29 NOTE — Progress Notes (Signed)
Patient notified of results  by Christena Flake, everything looks good. We'll see what cardiology says, but you're cleared from my perspective and I've sent a note to Dr. Leonides Schanz.

## 2020-06-03 ENCOUNTER — Other Ambulatory Visit: Payer: Self-pay | Admitting: Family Medicine

## 2020-06-03 NOTE — Telephone Encounter (Signed)
Requested medication (s) are due for refill today: yes  Requested medication (s) are on the active medication list: yes  Last refill:  04/26/2020  Future visit scheduled: yes  Notes to clinic:  this refill cannot be delegated    Requested Prescriptions  Pending Prescriptions Disp Refills   cyclobenzaprine (FLEXERIL) 10 MG tablet [Pharmacy Med Name: CYCLOBENZAPRINE 10MG  TABLETS] 60 tablet 2    Sig: TAKE 1 TABLET(10 MG) BY MOUTH TWICE DAILY AS NEEDED      Not Delegated - Analgesics:  Muscle Relaxants Failed - 06/03/2020 11:34 AM      Failed - This refill cannot be delegated      Passed - Valid encounter within last 6 months    Recent Outpatient Visits           1 week ago Preoperative clearance   [place], Megan P, DO   2 months ago 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, without long-term current use of insulin ([place])   [place], Megan P, DO   4 months ago 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, without long-term current use of insulin ([place])   [place], [place], [place]   8 months ago 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, without long-term current use of insulin [facility])   [place], [place], DO   9 months ago Vaginal dryness   [place], Lilia Argue, [place]       Future Appointments             In 1 month Wynetta Emery, Barb Merino, DO [employer], [facility]

## 2020-06-03 NOTE — Telephone Encounter (Signed)
Patient last seen 03/20/20 and 05/27/20

## 2020-06-08 ENCOUNTER — Other Ambulatory Visit: Payer: Self-pay | Admitting: Family Medicine

## 2020-06-08 MED ORDER — LISINOPRIL 5 MG PO TABS: ORAL_TABLET | ORAL | 1 refills | Status: DC

## 2020-06-08 NOTE — Telephone Encounter (Signed)
RX REFILL lisinopril (ZESTRIL) 5 MG tablet  PHARMACY [id number] - [address], [place] AT [facility] [address] Phone:  [phone]  Fax:  [phone]

## 2020-07-03 ENCOUNTER — Ambulatory Visit: Payer: BC Managed Care – PPO | Admitting: Internal Medicine

## 2020-07-03 ENCOUNTER — Ambulatory Visit: Payer: BC Managed Care – PPO

## 2020-07-03 ENCOUNTER — Other Ambulatory Visit: Payer: BC Managed Care – PPO

## 2020-07-09 ENCOUNTER — Other Ambulatory Visit: Payer: Self-pay | Admitting: Family Medicine

## 2020-07-20 ENCOUNTER — Other Ambulatory Visit: Payer: Self-pay | Admitting: Obstetrics & Gynecology

## 2020-07-22 ENCOUNTER — Other Ambulatory Visit: Payer: Self-pay | Admitting: Obstetrics & Gynecology
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
## 2020-07-26 ENCOUNTER — Other Ambulatory Visit: Payer: Self-pay | Admitting: Family Medicine

## 2020-07-27 ENCOUNTER — Other Ambulatory Visit: Payer: Self-pay

## 2020-07-27 ENCOUNTER — Encounter
Admission: RE | Admit: 2020-07-27 | Discharge: 2020-07-27 | Disposition: A | Discharge status: Home / Self Care | Payer: BC Managed Care – PPO | Source: Ambulatory Visit | Attending: Obstetrics & Gynecology | Admitting: Obstetrics & Gynecology

## 2020-07-27 HISTORY — DX: Unspecified asthma, uncomplicated: J45.909
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
## 2020-07-27 NOTE — Telephone Encounter (Signed)
Requested medication (s) are due for refill today: Yes  Requested medication (s) are on the active medication list: yes  Last refill:  06/07/20  Future visit scheduled: yes  Notes to clinic:  yes   Requested Prescriptions  Pending Prescriptions Disp Refills   cyclobenzaprine (FLEXERIL) 10 MG tablet [Pharmacy Med Name: CYCLOBENZAPRINE 10MG  TABLETS] 60 tablet 0    Sig: TAKE 1 TABLET(10 MG) BY MOUTH TWICE DAILY AS NEEDED      Not Delegated - Analgesics:  Muscle Relaxants Failed - 07/26/2020  7:26 PM      Failed - This refill cannot be delegated      Passed - Valid encounter within last 6 months    Recent Outpatient Visits           2 months ago Preoperative clearance   [place], Megan P, DO   4 months ago 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, without long-term current use of insulin ([place])   [place], Megan P, DO   6 months ago 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, without long-term current use of insulin [facility])   [facility], [place], [place]   10 months ago 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, without long-term current use of insulin [facility])   [place], [place], DO   11 months ago Vaginal dryness   [place], Lilia Arg[place], [place]       Future Appointments             In 3 days Wynetta Emery, Barb Merino, DO [employer], [facility]

## 2020-07-27 NOTE — Patient Instructions (Signed)
Your procedure is scheduled on: August 03, 2020 monday Report to Day Surgery on the 2nd floor of the [facility]. To find out your arrival time, please call [phone] between 1PM - 3PM on: Friday July 31, 2020  REMEMBER: Instructions that are not followed completely may result in serious medical risk, up to and including death; or upon the discretion of your surgeon and anesthesiologist your surgery may need to be rescheduled.  Do not eat food after midnight the night before surgery.  No gum chewing, lozengers or hard candies.  You may however, drink CLEAR liquids up to 2 hours before you are scheduled to arrive for your surgery. Do not drink anything within 2 hours of your scheduled arrival time.  Clear liquids include: - water     Do NOT drink anything that is not on this list.  Type 1 and Type 2 diabetics should only drink water.  YOUR DOCTOR HAS ORDER YOU A  Gatorade G2 Drinking this carbohydrate drink up to two hours before surgery helps to reduce insulin resistance and improve patient outcomes. Please complete drinking 2 hours prior to scheduled arrival time.  TAKE THESE MEDICATIONS THE MORNING OF SURGERY WITH A SIP OF WATER: CETIRIZINE  BUSPIRONE PANTOPRAZOLE (take one the night before and one on the morning of surgery - helps to prevent nausea after surgery.)  Use inhalers on the day of surgery   Stop Metformin 2 days prior to surgery. LAST DOSE Friday  July 31, 2020.  One week prior to surgery: Stop Anti-inflammatories (NSAIDS) such as Advil, Aleve, Ibuprofen, Motrin, Naproxen, Naprosyn and  ASPIRIN OR Aspirin based products such as Excedrin, Goodys Powder, BC Powder. Stop ANY OVER THE COUNTER supplements until after surgery. (You may continue taking Tylenol, Vitamin D, Vitamin B, and multivitamin.)  No Alcohol for 24 hours before or after surgery.  No Smoking including e-cigarettes for 24 hours prior to surgery.  No chewable tobacco products for at  least 6 hours prior to surgery.  No nicotine patches on the day of surgery.  Do not use any "recreational" drugs for at least a week prior to your surgery.  Please be advised that the combination of cocaine and anesthesia may have negative outcomes, up to and including death. If you test positive for cocaine, your surgery will be cancelled.  On the morning of surgery brush your teeth with toothpaste and water, you may rinse your mouth with mouthwash if you wish. Do not swallow any toothpaste or mouthwash.  Do not wear jewelry, make-up, hairpins, clips or nail polish.  Do not wear lotions, powders, or perfumes.   Do not shave 48 hours prior to surgery.   Contact lenses, hearing aids and dentures may not be worn into surgery.  Do not bring valuables to the hospital. [facility] is not responsible for any missing/lost belongings or valuables.   Use CHG Soap as directed on instruction sheet.  Notify your doctor if there is any change in your medical condition (cold, fever, infection).  Wear comfortable clothing (specific to your surgery type) to the hospital.  Plan for stool softeners for home use; pain medications have a tendency to cause constipation. You can also help prevent constipation by eating foods high in fiber such as fruits and vegetables and drinking plenty of fluids as your diet allows.  After surgery, you can help prevent lung complications by doing breathing exercises.  Take deep breaths and cough every 1-2 hours. Your doctor may order a device called an  Incentive Spirometer to help you take deep breaths. When coughing or sneezing, hold a pillow firmly against your incision with both hands. This is called "splinting." Doing this helps protect your incision. It also decreases belly discomfort.  If you are being discharged the day of surgery, you will not be allowed to drive home. You will need a responsible adult (18 years or older) to drive you home and stay with you  that night.   If you are taking public transportation, you will need to have a responsible adult (18 years or older) with you. Please confirm with your physician that it is acceptable to use public transportation.   Please call the [facility] at [phone] if you have any questions about these instructions.  Visitation Policy:  Patients undergoing a surgery or procedure may have one family member or support person with them as long as that person is not COVID-19 positive or experiencing its symptoms.  That person may remain in the waiting area during the procedure.  Inpatient Visitation Update:   In an effort to ensure the safety of our team members and our patients, we are implementing a change to our visitation policy:  Effective Monday, Aug. 9, at 7 a.m., inpatients will be allowed one support person.  o The support person may change daily.  o The support person must pass our screening, gel in and out, and wear a mask at all times, including in the patient's room.  o Patients must also wear a mask when staff or their support person are in the room.  o Masking is required regardless of vaccination status.  Systemwide, no visitors 17 or younger.

## 2020-07-29 ENCOUNTER — Telehealth: Payer: Self-pay

## 2020-07-29 NOTE — Telephone Encounter (Signed)
Routing to provider to advise. Patient is overdue for diabetes follow up. Has hysterectomy scheduled for Monday 08/03/20.

## 2020-07-29 NOTE — Telephone Encounter (Signed)
Pt stated she was unsure if she needed to come her apt scheduled on 07/30/2020 at 4:00pm due to her  having surgery on Monday and labs have been checked at her other doctor.Please advise.

## 2020-07-30 ENCOUNTER — Ambulatory Visit: Payer: BC Managed Care – PPO | Admitting: Family Medicine

## 2020-07-30 ENCOUNTER — Other Ambulatory Visit
Admission: RE | Admit: 2020-07-30 | Discharge: 2020-07-30 | Disposition: A | Discharge status: Home / Self Care | Payer: BC Managed Care – PPO | Source: Ambulatory Visit | Attending: Obstetrics & Gynecology | Admitting: Obstetrics & Gynecology

## 2020-07-30 DIAGNOSIS — Z01812 Encounter for preprocedural laboratory examination: Secondary | ICD-10-CM | POA: Insufficient documentation

## 2020-07-30 DIAGNOSIS — Z20822 Contact with and (suspected) exposure to covid-19: Secondary | ICD-10-CM | POA: Diagnosis not present

## 2020-07-30 LAB — TYPE AND SCREEN
ABO/RH(D): A POS
Antibody Screen: NEGATIVE

## 2020-07-30 LAB — SARS CORONAVIRUS 2 (TAT 6-24 HRS): SARS Coronavirus 2: NEGATIVE

## 2020-07-30 NOTE — Telephone Encounter (Signed)
Ok to keep appt on 11/4

## 2020-07-31 NOTE — Telephone Encounter (Signed)
Called pt advised per Dr Wynetta Emery ok to keep appt on 11/4. Pt verbalized understanding

## 2020-08-03 ENCOUNTER — Encounter: Payer: Self-pay | Admitting: Obstetrics & Gynecology

## 2020-08-03 ENCOUNTER — Observation Stay
Admission: RE | Admit: 2020-08-03 | Discharge: 2020-08-03 | Disposition: A | Discharge status: Home / Self Care | Payer: BC Managed Care – PPO | Attending: Obstetrics & Gynecology | Admitting: Obstetrics & Gynecology

## 2020-08-03 ENCOUNTER — Observation Stay: Payer: BC Managed Care – PPO | Admitting: Certified Registered"

## 2020-08-03 ENCOUNTER — Encounter
Admission: RE | Disposition: A | Discharge status: Home / Self Care | Payer: Self-pay | Source: Home / Self Care | Attending: Obstetrics & Gynecology

## 2020-08-03 ENCOUNTER — Observation Stay: Payer: BC Managed Care – PPO | Admitting: Urgent Care

## 2020-08-03 ENCOUNTER — Other Ambulatory Visit: Payer: Self-pay

## 2020-08-03 DIAGNOSIS — Z79899 Other long term (current) drug therapy: Secondary | ICD-10-CM | POA: Insufficient documentation

## 2020-08-03 DIAGNOSIS — R102 Pelvic and perineal pain: Secondary | ICD-10-CM | POA: Insufficient documentation

## 2020-08-03 DIAGNOSIS — D259 Leiomyoma of uterus, unspecified: Secondary | ICD-10-CM | POA: Insufficient documentation

## 2020-08-03 DIAGNOSIS — N189 Chronic kidney disease, unspecified: Secondary | ICD-10-CM | POA: Insufficient documentation

## 2020-08-03 DIAGNOSIS — I1310 Hypertensive heart and chronic kidney disease without heart failure, with stage 1 through stage 4 chronic kidney disease, or unspecified chronic kidney disease: Secondary | ICD-10-CM | POA: Insufficient documentation

## 2020-08-03 DIAGNOSIS — F17210 Nicotine dependence, cigarettes, uncomplicated: Secondary | ICD-10-CM | POA: Insufficient documentation

## 2020-08-03 DIAGNOSIS — N921 Excessive and frequent menstruation with irregular cycle: Secondary | ICD-10-CM | POA: Diagnosis not present

## 2020-08-03 DIAGNOSIS — Z9889 Other specified postprocedural states: Secondary | ICD-10-CM

## 2020-08-03 HISTORY — PX: LAPAROSCOPIC HYSTERECTOMY: SHX1926

## 2020-08-03 HISTORY — PX: LAPAROSCOPIC BILATERAL SALPINGECTOMY: SHX5889

## 2020-08-03 HISTORY — PX: ABDOMINAL HYSTERECTOMY: SHX81

## 2020-08-03 HISTORY — PX: CYSTOSCOPY: SHX5120

## 2020-08-03 HISTORY — PX: TOTAL ABDOMINAL HYSTERECTOMY: SHX209

## 2020-08-03 LAB — GLUCOSE, CAPILLARY
Glucose-Capillary: 113 mg/dL — ABNORMAL HIGH (ref 70–99)
Glucose-Capillary: 129 mg/dL — ABNORMAL HIGH (ref 70–99)

## 2020-08-03 LAB — ABO/RH: ABO/RH(D): A POS

## 2020-08-03 LAB — POCT PREGNANCY, URINE: Preg Test, Ur: NEGATIVE

## 2020-08-03 SURGERY — HYSTERECTOMY, TOTAL, LAPAROSCOPIC
Anesthesia: General

## 2020-08-03 MED ORDER — LIDOCAINE HCL (PF) 2 % IJ SOLN
INTRAMUSCULAR | Status: DC | PRN
  Administered 2020-08-03 (×2): 2 mg/kg/h via INTRADERMAL

## 2020-08-03 MED ORDER — MIDAZOLAM HCL 2 MG/2ML IJ SOLN
INTRAMUSCULAR | Status: AC
  Filled 2020-08-03: qty 2

## 2020-08-03 MED ORDER — LIDOCAINE HCL (CARDIAC) PF 100 MG/5ML IV SOSY
PREFILLED_SYRINGE | INTRAVENOUS | Status: DC | PRN
  Administered 2020-08-03: 80 mg via INTRAVENOUS

## 2020-08-03 MED ORDER — FENTANYL CITRATE (PF) 100 MCG/2ML IJ SOLN
INTRAMUSCULAR | Status: DC | PRN
  Administered 2020-08-03: 100 ug via INTRAVENOUS
  Administered 2020-08-03: 50 ug via INTRAVENOUS
  Administered 2020-08-03 (×2): 100 ug via INTRAVENOUS

## 2020-08-03 MED ORDER — DEXAMETHASONE SODIUM PHOSPHATE 10 MG/ML IJ SOLN
INTRAMUSCULAR | Status: AC
  Filled 2020-08-03: qty 1

## 2020-08-03 MED ORDER — IPRATROPIUM-ALBUTEROL 0.5-2.5 (3) MG/3ML IN SOLN
RESPIRATORY_TRACT | Status: AC
  Filled 2020-08-03: qty 3

## 2020-08-03 MED ORDER — ENOXAPARIN SODIUM 40 MG/0.4ML ~~LOC~~ SOLN: 40.0000 mg | SUBCUTANEOUS | 0 refills | Status: DC

## 2020-08-03 MED ORDER — CEFAZOLIN SODIUM-DEXTROSE 2-4 GM/100ML-% IV SOLN
INTRAVENOUS | Status: AC
  Filled 2020-08-03: qty 100

## 2020-08-03 MED ORDER — FENTANYL CITRATE (PF) 100 MCG/2ML IJ SOLN
INTRAMUSCULAR | Status: AC
  Filled 2020-08-03: qty 2

## 2020-08-03 MED ORDER — ROCURONIUM BROMIDE 100 MG/10ML IV SOLN
INTRAVENOUS | Status: DC | PRN
  Administered 2020-08-03: 80 mg via INTRAVENOUS

## 2020-08-03 MED ORDER — ACETAMINOPHEN 500 MG PO TABS: 1000.0000 mg | ORAL_TABLET | Freq: Four times a day (QID) | ORAL | 2 refills | Status: DC

## 2020-08-03 MED ORDER — ONDANSETRON HCL 4 MG/2ML IJ SOLN
INTRAMUSCULAR | Status: AC
  Filled 2020-08-03: qty 2

## 2020-08-03 MED ORDER — PROMETHAZINE HCL 25 MG/ML IJ SOLN: 6.2500 mg | INTRAMUSCULAR | Status: DC | PRN

## 2020-08-03 MED ORDER — ROCURONIUM BROMIDE 10 MG/ML (PF) SYRINGE
PREFILLED_SYRINGE | INTRAVENOUS | Status: AC
  Filled 2020-08-03: qty 10

## 2020-08-03 MED ORDER — ENSURE PRE-SURGERY PO LIQD
296.0000 mL | Freq: Once | ORAL | Status: DC
  Filled 2020-08-03: qty 296

## 2020-08-03 MED ORDER — POVIDONE-IODINE 10 % EX SWAB: 2.0000 "application " | Freq: Once | CUTANEOUS | Status: DC

## 2020-08-03 MED ORDER — METOPROLOL TARTRATE 5 MG/5ML IV SOLN
INTRAVENOUS | Status: AC
  Filled 2020-08-03: qty 5

## 2020-08-03 MED ORDER — ENOXAPARIN SODIUM 40 MG/0.4ML ~~LOC~~ SOLN
SUBCUTANEOUS | Status: AC
  Administered 2020-08-03: 40 mg via SUBCUTANEOUS
  Filled 2020-08-03: qty 0.4

## 2020-08-03 MED ORDER — CHLORHEXIDINE GLUCONATE 0.12 % MT SOLN
OROMUCOSAL | Status: AC
  Administered 2020-08-03: 15 mL via OROMUCOSAL
  Filled 2020-08-03: qty 15

## 2020-08-03 MED ORDER — CHLORHEXIDINE GLUCONATE 0.12 % MT SOLN: 15.0000 mL | Freq: Once | OROMUCOSAL | Status: AC

## 2020-08-03 MED ORDER — DEXAMETHASONE SODIUM PHOSPHATE 10 MG/ML IJ SOLN: 4.0000 mg | INTRAMUSCULAR | Status: AC

## 2020-08-03 MED ORDER — OXYCODONE HCL 5 MG PO TABS: 5.0000 mg | ORAL_TABLET | Freq: Once | ORAL | Status: AC | PRN

## 2020-08-03 MED ORDER — IBUPROFEN 800 MG PO TABS: 800.0000 mg | ORAL_TABLET | Freq: Four times a day (QID) | ORAL | 0 refills | Status: DC

## 2020-08-03 MED ORDER — ACETAMINOPHEN 500 MG PO TABS
ORAL_TABLET | ORAL | Status: AC
  Administered 2020-08-03: 1000 mg via ORAL
  Filled 2020-08-03: qty 2

## 2020-08-03 MED ORDER — DEXAMETHASONE SODIUM PHOSPHATE 10 MG/ML IJ SOLN
INTRAMUSCULAR | Status: DC | PRN
  Administered 2020-08-03: 5 mg via INTRAVENOUS

## 2020-08-03 MED ORDER — OXYCODONE HCL 5 MG PO TABS
ORAL_TABLET | ORAL | Status: AC
  Administered 2020-08-03: 5 mg via ORAL
  Filled 2020-08-03: qty 1

## 2020-08-03 MED ORDER — SILVER NITRATE-POT NITRATE 75-25 % EX MISC
CUTANEOUS | Status: DC | PRN
  Administered 2020-08-03: 1

## 2020-08-03 MED ORDER — OXYCODONE HCL 5 MG PO TABS: 5.0000 mg | ORAL_TABLET | ORAL | 0 refills | Status: DC | PRN

## 2020-08-03 MED ORDER — DEXMEDETOMIDINE (PRECEDEX) IN NS 20 MCG/5ML (4 MCG/ML) IV SYRINGE
PREFILLED_SYRINGE | INTRAVENOUS | Status: DC | PRN
  Administered 2020-08-03: 20 ug via INTRAVENOUS

## 2020-08-03 MED ORDER — KETOROLAC TROMETHAMINE 15 MG/ML IJ SOLN
INTRAMUSCULAR | Status: AC
  Administered 2020-08-03: 15 mg via INTRAVENOUS
  Filled 2020-08-03: qty 1

## 2020-08-03 MED ORDER — METOPROLOL TARTRATE 5 MG/5ML IV SOLN
INTRAVENOUS | Status: DC | PRN
  Administered 2020-08-03: 5 mg via INTRAVENOUS

## 2020-08-03 MED ORDER — KETOROLAC TROMETHAMINE 15 MG/ML IJ SOLN: 15.0000 mg | INTRAMUSCULAR | Status: AC

## 2020-08-03 MED ORDER — LIDOCAINE HCL (PF) 2 % IJ SOLN
INTRAMUSCULAR | Status: AC
  Filled 2020-08-03: qty 5

## 2020-08-03 MED ORDER — FENTANYL CITRATE (PF) 100 MCG/2ML IJ SOLN: 25.0000 ug | INTRAMUSCULAR | Status: DC | PRN

## 2020-08-03 MED ORDER — GABAPENTIN 300 MG PO CAPS
ORAL_CAPSULE | ORAL | Status: AC
  Administered 2020-08-03: 600 mg via ORAL
  Filled 2020-08-03: qty 2

## 2020-08-03 MED ORDER — SCOPOLAMINE 1 MG/3DAYS TD PT72: 1.0000 | MEDICATED_PATCH | TRANSDERMAL | Status: DC

## 2020-08-03 MED ORDER — MIDAZOLAM HCL 2 MG/2ML IJ SOLN
INTRAMUSCULAR | Status: DC | PRN
  Administered 2020-08-03: 2 mg via INTRAVENOUS

## 2020-08-03 MED ORDER — PROPOFOL 10 MG/ML IV BOLUS
INTRAVENOUS | Status: DC | PRN
  Administered 2020-08-03: 150 mg via INTRAVENOUS

## 2020-08-03 MED ORDER — OXYCODONE HCL 5 MG/5ML PO SOLN: 5.0000 mg | Freq: Once | ORAL | Status: AC | PRN

## 2020-08-03 MED ORDER — PHENYLEPHRINE HCL (PRESSORS) 10 MG/ML IV SOLN
INTRAVENOUS | Status: DC | PRN
  Administered 2020-08-03: 200 ug via INTRAVENOUS

## 2020-08-03 MED ORDER — PROPOFOL 10 MG/ML IV BOLUS
INTRAVENOUS | Status: AC
  Filled 2020-08-03: qty 20

## 2020-08-03 MED ORDER — ONDANSETRON HCL 4 MG/2ML IJ SOLN
INTRAMUSCULAR | Status: DC | PRN
  Administered 2020-08-03: 4 mg via INTRAVENOUS

## 2020-08-03 MED ORDER — ORAL CARE MOUTH RINSE: 15.0000 mL | Freq: Once | OROMUCOSAL | Status: AC

## 2020-08-03 MED ORDER — ACETAMINOPHEN 500 MG PO TABS: 1000.0000 mg | ORAL_TABLET | ORAL | Status: AC

## 2020-08-03 MED ORDER — DEXAMETHASONE SODIUM PHOSPHATE 10 MG/ML IJ SOLN
INTRAMUSCULAR | Status: AC
  Administered 2020-08-03: 4 mg via INTRAVENOUS
  Filled 2020-08-03: qty 1

## 2020-08-03 MED ORDER — DEXMEDETOMIDINE (PRECEDEX) IN NS 20 MCG/5ML (4 MCG/ML) IV SYRINGE
PREFILLED_SYRINGE | INTRAVENOUS | Status: AC
  Filled 2020-08-03: qty 5

## 2020-08-03 MED ORDER — SODIUM CHLORIDE 0.9 % IV SOLN: INTRAVENOUS | Status: DC

## 2020-08-03 MED ORDER — FENTANYL CITRATE (PF) 250 MCG/5ML IJ SOLN
INTRAMUSCULAR | Status: AC
  Filled 2020-08-03: qty 5

## 2020-08-03 MED ORDER — SCOPOLAMINE 1 MG/3DAYS TD PT72
MEDICATED_PATCH | TRANSDERMAL | Status: AC
  Administered 2020-08-03: 1.5 mg via TRANSDERMAL
  Filled 2020-08-03: qty 1

## 2020-08-03 MED ORDER — ENOXAPARIN SODIUM 40 MG/0.4ML ~~LOC~~ SOLN: 40.0000 mg | SUBCUTANEOUS | Status: AC

## 2020-08-03 MED ORDER — CEFAZOLIN SODIUM-DEXTROSE 2-4 GM/100ML-% IV SOLN
2.0000 g | INTRAVENOUS | Status: AC
  Administered 2020-08-03: 2 g via INTRAVENOUS

## 2020-08-03 MED ORDER — SEVOFLURANE IN SOLN
RESPIRATORY_TRACT | Status: AC
  Filled 2020-08-03: qty 500

## 2020-08-03 MED ORDER — GABAPENTIN 300 MG PO CAPS: 600.0000 mg | ORAL_CAPSULE | ORAL | Status: AC

## 2020-08-03 SURGICAL SUPPLY — 73 items
ADH SKN CLS APL DERMABOND .7 (GAUZE/BANDAGES/DRESSINGS) ×3
APL PRP STRL LF DISP 70% ISPRP (MISCELLANEOUS) ×6
BACTOSHIELD CHG 4% 4OZ (MISCELLANEOUS) ×2
BAG DRN RND TRDRP ANRFLXCHMBR (UROLOGICAL SUPPLIES)
BAG SPEC RTRVL LRG 6X4 10 (ENDOMECHANICALS)
BAG URINE DRAIN 2000ML AR STRL (UROLOGICAL SUPPLIES) IMPLANT
BINDER ABDOMINAL 12 ML 46-62 (SOFTGOODS) ×5 IMPLANT
BLADE SURG SZ11 CARB STEEL (BLADE) ×5 IMPLANT
CANISTER SUCT 1200ML W/VALVE (MISCELLANEOUS) IMPLANT
CATH FOLEY 2WAY  5CC 16FR (CATHETERS) ×2
CATH FOLEY 2WAY 5CC 16FR (CATHETERS) ×3
CATH URTH 16FR FL 2W BLN LF (CATHETERS) ×3 IMPLANT
CHLORAPREP W/TINT 26 (MISCELLANEOUS) ×10 IMPLANT
COUNTER NEEDLE 20/40 LG (NEEDLE) ×5 IMPLANT
COVER WAND RF STERILE (DRAPES) ×5 IMPLANT
DEFOGGER SCOPE WARMER CLEARIFY (MISCELLANEOUS) ×5 IMPLANT
DERMABOND ADVANCED (GAUZE/BANDAGES/DRESSINGS) ×2
DERMABOND ADVANCED .7 DNX12 (GAUZE/BANDAGES/DRESSINGS) ×3 IMPLANT
DEVICE SUTURE ENDOST 10MM (ENDOMECHANICALS) IMPLANT
DRAPE 3/4 80X56 (DRAPES) ×5 IMPLANT
DRAPE LEGGINS SURG 28X43 STRL (DRAPES) ×5 IMPLANT
DRAPE UNDER BUTTOCK W/FLU (DRAPES) ×10 IMPLANT
ELECT REM PT RETURN 9FT ADLT (ELECTROSURGICAL) ×5
ELECTRODE REM PT RTRN 9FT ADLT (ELECTROSURGICAL) ×3 IMPLANT
GAUZE SPONGE 4X4 16PLY XRAY LF (GAUZE/BANDAGES/DRESSINGS) ×5 IMPLANT
GLOVE PI ORTHOPRO 6.5 (GLOVE) ×4
GLOVE PI ORTHOPRO STRL 6.5 (GLOVE) ×6 IMPLANT
GLOVE SURG SYN 6.5 ES PF (GLOVE) ×15 IMPLANT
GOWN STRL REUS W/ TWL LRG LVL3 (GOWN DISPOSABLE) ×9 IMPLANT
GOWN STRL REUS W/ TWL XL LVL3 (GOWN DISPOSABLE) ×12 IMPLANT
GOWN STRL REUS W/TWL LRG LVL3 (GOWN DISPOSABLE) ×15
GOWN STRL REUS W/TWL XL LVL3 (GOWN DISPOSABLE) ×20
GRASPER SUT TROCAR 14GX15 (MISCELLANEOUS) IMPLANT
HANDLE YANKAUER SUCT BULB TIP (MISCELLANEOUS) IMPLANT
IRRIGATION STRYKERFLOW (MISCELLANEOUS) IMPLANT
IRRIGATOR STRYKERFLOW (MISCELLANEOUS)
IV LACTATED RINGERS 1000ML (IV SOLUTION) ×5 IMPLANT
KIT PINK PAD W/HEAD ARE REST (MISCELLANEOUS) ×5
KIT PINK PAD W/HEAD ARM REST (MISCELLANEOUS) ×3 IMPLANT
KIT TURNOVER CYSTO (KITS) ×5 IMPLANT
L-HOOK LAP DISP 36CM (ELECTROSURGICAL) ×5
LABEL OR SOLS (LABEL) IMPLANT
LHOOK LAP DISP 36CM (ELECTROSURGICAL) ×3 IMPLANT
LIGASURE VESSEL 5MM BLUNT TIP (ELECTROSURGICAL) ×5 IMPLANT
MANIPULATOR UTERINE 4.5 ZUMI (MISCELLANEOUS) IMPLANT
MANIPULATOR VCARE LG CRV RETR (MISCELLANEOUS) IMPLANT
MANIPULATOR VCARE SML CRV RETR (MISCELLANEOUS) IMPLANT
MANIPULATOR VCARE STD CRV RETR (MISCELLANEOUS) ×5 IMPLANT
NS IRRIG 500ML POUR BTL (IV SOLUTION) ×5 IMPLANT
PACK LAP CHOLECYSTECTOMY (MISCELLANEOUS) ×5 IMPLANT
PAD OB MATERNITY 4.3X12.25 (PERSONAL CARE ITEMS) ×5 IMPLANT
PAD PREP 24X41 OB/GYN DISP (PERSONAL CARE ITEMS) ×5 IMPLANT
PENCIL ELECTRO HAND CTR (MISCELLANEOUS) ×5 IMPLANT
POUCH SPECIMEN RETRIEVAL 10MM (ENDOMECHANICALS) IMPLANT
SCRUB CHG 4% DYNA-HEX 4OZ (MISCELLANEOUS) ×3 IMPLANT
SET CYSTO W/LG BORE CLAMP LF (SET/KITS/TRAYS/PACK) IMPLANT
SET TUBE SMOKE EVAC HIGH FLOW (TUBING) ×5 IMPLANT
SLEEVE ENDOPATH XCEL 5M (ENDOMECHANICALS) ×10 IMPLANT
SURGILUBE 2OZ TUBE FLIPTOP (MISCELLANEOUS) ×5 IMPLANT
SUT ENDO VLOC 180-0-8IN (SUTURE) IMPLANT
SUT MNCRL 4-0 (SUTURE) ×5
SUT MNCRL 4-0 27XMFL (SUTURE) ×3
SUT MNCRL AB 4-0 PS2 18 (SUTURE) IMPLANT
SUT VIC AB 0 CT1 36 (SUTURE) ×10 IMPLANT
SUT VIC AB 3-0 SH 27 (SUTURE) ×5
SUT VIC AB 3-0 SH 27X BRD (SUTURE) ×3 IMPLANT
SUT VICRYL 0 AB UR-6 (SUTURE) IMPLANT
SUTURE MNCRL 4-0 27XMF (SUTURE) ×3 IMPLANT
SYR 50ML LL SCALE MARK (SYRINGE) IMPLANT
TROCAR ENDO BLADELESS 11MM (ENDOMECHANICALS) IMPLANT
TROCAR XCEL NON-BLD 5MMX100MML (ENDOMECHANICALS) ×5 IMPLANT
TUBING ART PRESS 48 MALE/FEM (TUBING) IMPLANT
TUBING EVAC SMOKE HEATED PNEUM (TUBING) ×5 IMPLANT

## 2020-08-03 NOTE — Discharge Instructions (Signed)
Discharge instructions:  Call office if you have any of the following: fever >101 F, chills, shortness of breath, excessive vaginal bleeding, incision drainage or problems, leg pain or redness, or any other concerns.   Activity: Do not lift > 20 lbs for 8 weeks.  No intercourse or tampons for 8 weeks.  No driving until you are certain you can slam on the brakes, and of course never while taking narcotics.   You may feel some pain in your upper right abdomen/rib and right shoulder.  This is from the gas in the abdomen for surgery. This will subside over time, please be patient!  Take 800mg  Ibuprofen and 1000mg  Tylenol together, around the clock, every 6 hours for at least the first 3-5 days.  After this you can take as needed.  This will help decrease inflammation and promote healing.  The narcotics you'll take just as needed, as they just trick your brain into thinking its not in pain.    WEAR THE ABDOMINAL BINDER!!!  Make sure it covers your incisions. Take it off to shower, but try to wear it religiously.  After about a week, you can wear it as needed.    Lovenox is injected into the skin once daily. It is to help prevent a blood clot from forming until you are mostly mobile.  I've ordered 7 days of treatment.   Please don't limit yourself in terms of routine activity.  You will be able to do most things, although they may take longer to do or be a little painful.  You can do it!  Don't be a hero, but don't be a wimp either!    AMBULATORY SURGERY  DISCHARGE INSTRUCTIONS   1) The drugs that you were given will stay in your system until tomorrow so for the next 24 hours you should not:  A) Drive an automobile B) Make any legal decisions C) Drink any alcoholic beverage   2) You may resume regular meals tomorrow.  Today it is better to start with liquids and gradually work up to solid foods.  You may eat anything you prefer, but it is better to start with liquids, then soup and  crackers, and gradually work up to solid foods.   3) Please notify your doctor immediately if you have any unusual bleeding, trouble breathing, redness and pain at the surgery site, drainage, fever, or pain not relieved by medication.    4) Additional Instructions:        Please contact your physician with any problems or Same Day Surgery at [phone], Monday through Friday 6 am to 4 pm, or [place] at [facility] number at [phone].

## 2020-08-03 NOTE — Interval H&P Note (Signed)
History and Physical Interval Note:  08/03/2020 7:21 AM  [name]  has presented today for surgery, with the diagnosis of menometrorrhagia, fibroid uterus, and pelvic pain.  The various methods of treatment have been discussed with the patient and family. After consideration of risks, benefits and other options for treatment, the patient has consented to  Procedure(s): HYSTERECTOMY TOTAL LAPAROSCOPIC (N/A) LAPAROSCOPIC BILATERAL SALPINGECTOMY (Bilateral) as a surgical intervention.  The patient's history has been reviewed, patient examined, no change in status, stable for surgery.  I have reviewed the patient's chart and labs.  Questions were answered to the patient's satisfaction.     [place]

## 2020-08-03 NOTE — Anesthesia Procedure Notes (Signed)
Procedure Name: Intubation Performed by: Gaynelle Cage, CRNA Pre-anesthesia Checklist: Patient identified, Emergency Drugs available, Suction available and Patient being monitored Patient Re-evaluated:Patient Re-evaluated prior to induction Oxygen Delivery Method: Circle system utilized Preoxygenation: Pre-oxygenation with 100% oxygen Induction Type: IV induction Ventilation: Mask ventilation without difficulty and Oral airway inserted - appropriate to patient size Laryngoscope Size: McGraph and 3 Grade View: Grade I Tube type: Oral Tube size: 8.0 mm Number of attempts: 1 Airway Equipment and Method: Stylet and Oral airway Placement Confirmation: ETT inserted through vocal cords under direct vision,  positive ETCO2 and breath sounds checked- equal and bilateral Secured at: 22 cm Tube secured with: Tape Dental Injury: Teeth and Oropharynx as per pre-operative assessment

## 2020-08-03 NOTE — Op Note (Signed)
Total Laparoscopic Hysterectomy Operative Note Procedure Date: 08/03/2020  Patient:  Unique Sillas Border  44 y.o. female  PRE-OPERATIVE DIAGNOSIS:  menometrorrhagia, fibroid uterus, pelvic pain  POST-OPERATIVE DIAGNOSIS:  menometrorrhagia, fibroid uterus, pelvic pain  PROCEDURE:  Procedure(s): HYSTERECTOMY TOTAL LAPAROSCOPIC (N/A) LAPAROSCOPIC BILATERAL SALPINGECTOMY (Bilateral) CYSTOSCOPY  SURGEON:  Surgeon(s) and Role:    * Otniel Hoe, Honor Loh, MD - Primary    * Schermerhorn, Gwen Her, MD - Assisting  ANESTHESIA:  General via ET  I/O  Total I/O In: 9563 [I.V.:1101; IV Piggyback:100] Out: 300 [Urine:200; Blood:100]  FINDINGS:   Pelvic exam:  Normal external vulva, cervix with evidence of prior laceration (posterior), enlarged mobile uterus. Laparoscopy: Enlarged fibroid uterus, normal left ovary, right ovary with 3cm hemorrhagic cyst, normal fallopian tubes bilaterally.  Normal upper abdomen. Normal appendix. Hemosiderin deposits on the left uterosacral ligament, adhesions of left ovary to the side wall, allen-masters windows in medial peritoneum bilaterally. Unable to see right ureter transperitoneally. Cystoscopy: normal bladder walls, bilateral ureteral jets.   SPECIMEN: Uterus, Cervix, and bilateral fallopian tubes, right ovarian cyst  COMPLICATIONS: none apparent  DISPOSITION: vital signs stable to PACU  Indication for Surgery: 44 y.o. G0P0000 who presented with history of menometrorrhagia, fibroid uterus, and chronic dysmenorrhea.  She had trialed hormonal therapy without satisfactory improvement, and requested definitive surgery.    Risks of surgery were discussed with the patient including but not limited to: bleeding which may require transfusion or reoperation; infection which may require antibiotics; injury to bowel, bladder, ureters or other surrounding organs; unsatisfactory resolution to her pain syndrome, need for additional procedures including laparotomy, blood  clot, incisional problems and other postoperative/anesthesia complications. Written informed consent was obtained.      PROCEDURE IN DETAIL:  The patient had 5000u Heparin Sub-q and sequential compression devices applied to her lower extremities while in the preoperative area.  She was then taken to the operating room. IV antibiotics were given. General anesthesia was administered via endotracheal route.  She was placed in the dorsal lithotomy position, and was prepped and draped in a sterile manner. A surgical time-out was performed.  A Foley catheter was inserted into her bladder and attached to constant drainage and a V-Care uterine manipulator was then advanced into the uterus and a good fit around the cervix was noted. The gloves were changed, and attention was turned to the abdomen where an umbilical incision was made with the scalpel.  A 106mm trochar was inserted in the umbilical incision using a visiport method.Opening pressure was 84mmHg, and the abdomen was insufflated to 67mmHg carbon dioxide gas and adequate pneumoperitoneum was obtained. A survey of the patient's pelvis and abdomen revealed the findings as mentioned above. Two 73mm ports were inserted in the lower left and right quadrants under visualization.    The bilateral fallopian tubes were separated from the mesosalpinx using the Ligasure. The bilateral round ligaments were transected and anterior broad ligament divided and brought across the uterus to separate the vesicouterine peritoneum and create a bladder flap. The bladder was pushed away from the uterus. The bilateral uterine arteries were skeletonized, ligated and transected. The bilateral uterosacral and cardinal ligaments were ligated and transected. A colpotomy was made around the V-Care cervical cup and the uterus, cervix, and bilateral tubes were removed through the vagina. The vaginal cuff was closed vaginally using 0-Vicryl in a running locking stitch. This was tested  for integrity using the surgeon's finger. After a change of gloves, the pneumoperitoneum was recreated and surgical site inspected,  and found to be hemostatic. No intraoperative injury to surrounding organs was noted. The abdomen was desufflated and all instruments were then removed.   The attention was turned to the bladder.  A cystoscope was inserted into the urethra after the foley was removed.  The bladder was inflated with 200cc of normal saline, and inspection of the bladder wall was performed which showed no defects or unusual findings.  Bilateral ureteral jets were observed.  The cystoscope was removed.  All skin incisions were closed with 4-0 monocryl and covered with surgical glue. The patient tolerated the procedures well.  There was noted to be a trickle of blood from the vagina at the conclusion of the case.  The cuff was intact and hemostatic.  The bleeding was coming from a small laceration of the right hymenal tissue, which was repaired with 3-0 vicryl in a figure of eight.  All instruments, needles, and sponge counts were correct x 2. The patient was taken to the recovery room in stable condition.   ---- Larey Days, MD Attending Obstetrician and [facility]
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
## 2020-08-03 NOTE — Transfer of Care (Signed)
Immediate Anesthesia Transfer of Care Note  Patient: [name]  Procedure(s) Performed: HYSTERECTOMY TOTAL LAPAROSCOPIC (N/A ) LAPAROSCOPIC BILATERAL SALPINGECTOMY (Bilateral ) CYSTOSCOPY  Patient Location: PACU  Anesthesia Type:General  Level of Consciousness: awake  Airway & Oxygen Therapy: Patient Spontanous Breathing and Patient connected to face mask oxygen  Post-op Assessment: Report given to RN and Post -op Vital signs reviewed and stable  Post vital signs: Reviewed and stable  Last Vitals:  Vitals Value Taken Time  BP 104/45 08/03/20 1006  Temp 36.6 C 08/03/20 1006  Pulse 82 08/03/20 1009  Resp 16 08/03/20 1009  SpO2 99 % 08/03/20 1009  Vitals shown include unvalidated device data.  Last Pain:  Vitals:   08/03/20 0729  TempSrc: Tympanic  PainSc: 0-No pain      Patients Stated Pain Goal: 0 (50/01/64 2903)  Complications: No complications documented.

## 2020-08-04 ENCOUNTER — Encounter: Payer: Self-pay | Admitting: Obstetrics & Gynecology

## 2020-08-04 NOTE — Anesthesia Postprocedure Evaluation (Signed)
Anesthesia Post Note  Patient: Charmian S Border  Procedure(s) Performed: HYSTERECTOMY TOTAL LAPAROSCOPIC (N/A ) LAPAROSCOPIC BILATERAL SALPINGECTOMY (Bilateral ) CYSTOSCOPY  Patient location during evaluation: PACU Anesthesia Type: General Level of consciousness: awake and alert Pain management: pain level controlled Vital Signs Assessment: post-procedure vital signs reviewed and stable Respiratory status: spontaneous breathing, nonlabored ventilation and respiratory function stable Cardiovascular status: blood pressure returned to baseline and stable Postop Assessment: no apparent nausea or vomiting Anesthetic complications: no   No complications documented.   Last Vitals:  Vitals:   08/03/20 1050 08/03/20 1102  BP:  128/66  Pulse:  75  Resp:  16  Temp: (!) 36.4 C (!) 36.1 C  SpO2:  98%    Last Pain:  Vitals:   08/03/20 1102  TempSrc: Tympanic  PainSc: [place]

## 2020-08-05 LAB — SURGICAL PATHOLOGY

## 2020-08-20 ENCOUNTER — Ambulatory Visit: Payer: BC Managed Care – PPO | Admitting: Family Medicine

## 2020-08-20 ENCOUNTER — Other Ambulatory Visit: Payer: Self-pay

## 2020-08-20 ENCOUNTER — Encounter: Payer: Self-pay | Admitting: Family Medicine

## 2020-08-20 VITALS — BP 117/78 | HR 83 | Temp 98.2°F | Ht 72.0 in | Wt 235.0 lb

## 2020-08-20 DIAGNOSIS — D500 Iron deficiency anemia secondary to blood loss (chronic): Secondary | ICD-10-CM | POA: Diagnosis not present

## 2020-08-20 DIAGNOSIS — N181 Chronic kidney disease, stage 1: Secondary | ICD-10-CM | POA: Diagnosis not present

## 2020-08-20 DIAGNOSIS — Z23 Encounter for immunization: Secondary | ICD-10-CM

## 2020-08-20 DIAGNOSIS — E1122 Type 2 diabetes mellitus with diabetic chronic kidney disease: Secondary | ICD-10-CM

## 2020-08-20 LAB — BAYER DCA HB A1C WAIVED: HB A1C (BAYER DCA - WAIVED): 6.2 % (ref <7.0)

## 2020-08-20 MED ORDER — ALBUTEROL SULFATE HFA 108 (90 BASE) MCG/ACT IN AERS: 2.0000 | INHALATION_SPRAY | Freq: Four times a day (QID) | RESPIRATORY_TRACT | 6 refills | Status: DC | PRN

## 2020-08-20 NOTE — Progress Notes (Signed)
BP 117/78   Pulse 83   Temp 98.2 F (36.8 C) (Oral)   Ht 6' (1.829 m)   Wt 235 lb (106.6 kg)   SpO2 100%   BMI 31.87 kg/m    Subjective:    Patient ID: [name], female    DOB: [birth year], 44 y.o.   MRN: [id number]  HPI: [name] is a 44 y.o. female  Chief Complaint  Patient presents with  . Diabetes  . Flu Vaccine    has questions on when she can get the booster and flu    Came through her hysterectomy well. Feeling well. Still pushing a little too hard. No bleeding. Saw hematology last week and things looked good.   DIABETES- has been really working on diet and exercise. Has lost a significant amount of weight. Feeling better.  Hypoglycemic episodes:no Polydipsia/polyuria: no Visual disturbance: no Chest pain: no Paresthesias: no Glucose Monitoring: yes  Accucheck frequency: occasionally Taking Insulin?: no Blood Pressure Monitoring: not checking Retinal Examination: Not up to Date Foot Exam: Up to Date Diabetic Education: Completed Pneumovax: Up to Date Influenza: Up to Date  Relevant past medical, surgical, family and social history reviewed and updated as indicated. Interim medical history since our last visit reviewed. Allergies and medications reviewed and updated.  Review of Systems  Per HPI unless specifically indicated above     Objective:    BP 117/78   Pulse 83   Temp 98.2 F (36.8 C) (Oral)   Ht 6' (1.829 m)   Wt 235 lb (106.6 kg)   SpO2 100%   BMI 31.87 kg/m   Wt Readings from Last 3 Encounters:  08/20/20 235 lb (106.6 kg)  08/03/20 246 lb (111.6 kg)  07/27/20 240 lb (108.9 kg)    Physical Exam Vitals and nursing note reviewed.  Constitutional:      General: She is not in acute distress.    Appearance: Normal appearance. She is not ill-appearing, toxic-appearing or diaphoretic.  HENT:     Head: Normocephalic and atraumatic.     Right Ear: External ear normal.     Left Ear: External ear normal.       Nose: Nose normal.     Mouth/Throat:     Mouth: Mucous membranes are moist.     Pharynx: Oropharynx is clear.  Eyes:     General: No scleral icterus.       Right eye: No discharge.        Left eye: No discharge.     Extraocular Movements: Extraocular movements intact.     Conjunctiva/sclera: Conjunctivae normal.     Pupils: Pupils are equal, round, and reactive to light.  Cardiovascular:     Rate and Rhythm: Normal rate and regular rhythm.     Pulses: Normal pulses.     Heart sounds: Normal heart sounds. No murmur heard.  No friction rub. No gallop.   Pulmonary:     Effort: Pulmonary effort is normal. No respiratory distress.     Breath sounds: Normal breath sounds. No stridor. No wheezing, rhonchi or rales.  Chest:     Chest wall: No tenderness.  Musculoskeletal:        General: Normal range of motion.     Cervical back: Normal range of motion and neck supple.  Skin:    General: Skin is warm and dry.     Capillary Refill: Capillary refill takes less than 2 seconds.     Coloration: Skin is not jaundiced  or pale.     Findings: No bruising, erythema, lesion or rash.  Neurological:     General: No focal deficit present.     Mental Status: She is alert and oriented to person, place, and time. Mental status is at baseline.  Psychiatric:        Mood and Affect: Mood normal.        Behavior: Behavior normal.        Thought Content: Thought content normal.        Judgment: Judgment normal.     Results for orders placed or performed during the hospital encounter of 08/03/20  Glucose, capillary  Result Value Ref Range   Glucose-Capillary 113 (H) 70 - 99 mg/dL  Glucose, capillary  Result Value Ref Range   Glucose-Capillary 129 (H) 70 - 99 mg/dL  Pregnancy, urine POC  Result Value Ref Range   Preg Test, Ur NEGATIVE NEGATIVE  ABO/Rh  Result Value Ref Range   ABO/RH(D)      A POS Performed at [facility], [address], [place], [place] [address]   Surgical  pathology  Result Value Ref Range   SURGICAL PATHOLOGY      SURGICAL PATHOLOGY CASE: [id number] PATIENT: Evlyn BORDER Surgical Pathology Report     Specimen Submitted: A. Uterus with cervix and bilateral tubes B. Right ovarian cyst  Clinical History: Menometrorrhagia, pelvic pain.      DIAGNOSIS: A. UTERUS WITH CERVIX AND BILATERAL FALLOPIAN TUBES; TOTAL HYSTERECTOMY WITH BILATERAL SALPINGECTOMY: - CERVIX:      - BENIGN ENDOCERVICAL POLYP.      - NEGATIVE FOR DYSPLASIA AND MALIGNANCY. - ENDOMETRIUM:      - BENIGN ENDOMETRIAL POLYP.      - NEGATIVE FOR ATYPIA / EIN AND MALIGNANCY. - MYOMETRIUM:      - LEIOMYOMATA. - BILATERAL FALLOPIAN TUBES:      - NO SIGNIFICANT PATHOLOGIC ALTERATION.  B. OVARIAN CYST, RIGHT; OVARIAN CYSTECTOMY: - HEMORRHAGIC CORPUS LUTEUM CYST. - NEGATIVE FOR MALIGNANCY.   GROSS DESCRIPTION: A. Labeled: Uterus with cervix and bilateral tubes Received: Formalin Weight: 353 grams Dimensions:      Fundus -10.5 x 10.5 x 7 cm      Cervix -4.7 x 4 x 3.1 cm Serosa: Markedly eroded and  ragged especially toward the junction of the cervix Cervix: The ectocervical mucosa is pink-red and smooth and there is also a protruding pink-red endocervical polyp that measures 1.2 x 1.1 x 0.3 cm in greatest dimension noted. Endocervix: Pale-tan and soft Endometrial cavity:      Dimensions - 6 x 2.5 x 0.4 cm      Thickness -0.1 cm      Other findings -within the endometrial cavity is a pink-red pedunculated polyp 1.7 x 0.6 x 0.2 cm which appears to be confined to the lining. Myometrium:     Thickness -up to 3.7 cm     Other findings -within the myometrium are 2 pale-tan rubbery intramural nodules ranging from 1.2 x 1 x 0.7 cm up to 6.5 x 5.5 x 8 cm in greatest dimensions, each of which have a whorled to nodular pattern. The largest nodule is also seen corresponding to the subserosal bulge. Portions of the larger nodule are markedly softened and  hemorrhagic. Adnexa: The ovaries are not received.      One fallopian tube-weighs 1 g           Measurements -2.7 cm i n length x 0.5 cm in diameter  Other findings -a cylindrically shaped pink-red soft tissue possibly a portion of fallopian tube, without fimbria. On sectioning a lumen is noted.      Other fallopian tube-weighs 4 g            Measurements -8 cm in length x 0.5- 0.7 cm in diameter           Other findings -a portion of unremarkable fallopian tube with fimbria. Other comments: None  Block summary: 1-endocervical polyp 2-3- cervix 4- endomyometrium with endometrial polyp 5-7-endomyometrium 8-9-sections of myometrial nodules 10-entirely serially sectioned first portion of possible fallopian tube 11-12-entire longitudinally sectioned fimbriated end of second fallopian tube and representative cross-sections  B. Labeled: Right ovarian cyst Received: Formalin Tissue fragment(s): 2 Size: 3.7 x 2.3 x 0.7 cm Description: 2 fragments of pink-red fibromembranous tissue which is slightly hemorrhagic Representative sections submitted in 1 cassette.   Final Diagno sis performed by Betsy Pries, MD.   Electronically signed 08/05/2020 4:27:53PM The electronic signature indicates that the named Attending Pathologist has evaluated the specimen Technical component performed at [facility], [address] [place], [place] [address] Lab: [phone] Dir: Rush Farmer, MD, MMM  Professional component performed at [facility], [facility], [place], [place], [place] [address] Lab: [phone] Dir: Dellia Nims. 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 Call with any concerns. Continue to monitor.       Relevant Orders    [facility] [place] Hb A1c Waived   Ambulatory referral to Ophthalmology     Other   Iron deficiency anemia due to chronic blood loss    S/p hysterectomy. Feeling well. No more bleeding. Will get her labs checked today. Await results.       Relevant Orders   CBC with Differential/Platelet    Other Visit Diagnoses    Needs flu shot       Relevant Orders   Flu Vaccine QUAD 6+ mos PF IM (Fluarix Quad PF)   Need for immunization against influenza       Relevant Orders   Flu Vaccine QUAD 36+ mos IM (Completed)       Follow up plan: Return in about 3 months (around 11/20/2020).

## 2020-08-20 NOTE — Assessment & Plan Note (Signed)
S/p hysterectomy. Feeling well. No more bleeding. Will get her labs checked today. Await results.

## 2020-08-20 NOTE — Assessment & Plan Note (Signed)
Doing well with A1c of 6.2- we will stop jardiance due to yeast infections and recheck 3 months. Call with any concerns. Continue to monitor.

## 2020-08-21 LAB — CBC WITH DIFFERENTIAL/PLATELET
Basophils Absolute: 0.1 10*3/uL (ref 0.0–0.2)
Basos: 1 %
EOS (ABSOLUTE): 0.1 10*3/uL (ref 0.0–0.4)
Eos: 2 %
Hematocrit: 44 % (ref 34.0–46.6)
Hemoglobin: 13.9 g/dL (ref 11.1–15.9)
Immature Grans (Abs): 0 10*3/uL (ref 0.0–0.1)
Immature Granulocytes: 0 %
Lymphocytes Absolute: 2.5 10*3/uL (ref 0.7–3.1)
Lymphs: 33 %
MCH: 23.6 pg — ABNORMAL LOW (ref 26.6–33.0)
MCHC: 31.6 g/dL (ref 31.5–35.7)
MCV: 75 fL — ABNORMAL LOW (ref 79–97)
Monocytes Absolute: 0.7 10*3/uL (ref 0.1–0.9)
Monocytes: 9 %
Neutrophils Absolute: 4.2 10*3/uL (ref 1.4–7.0)
Neutrophils: 55 %
Platelets: 266 10*3/uL (ref 150–450)
RBC: 5.9 x10E6/uL — ABNORMAL HIGH (ref 3.77–5.28)
RDW: 18.1 % — ABNORMAL HIGH (ref 11.7–15.4)
WBC: 7.6 10*3/uL (ref 3.4–10.8)

## 2020-08-24 LAB — HM DIABETES EYE EXAM

## 2020-09-05 ENCOUNTER — Other Ambulatory Visit: Payer: Self-pay | Admitting: Family Medicine

## 2020-09-05 ENCOUNTER — Encounter: Payer: Self-pay | Admitting: Family Medicine

## 2020-09-11 ENCOUNTER — Other Ambulatory Visit: Payer: Self-pay | Admitting: Family Medicine

## 2020-09-11 NOTE — Telephone Encounter (Signed)
Requested medication (s) are due for refill today: no  Requested medication (s) are on the active medication list: yes   Last refill: 07/27/2020  Future visit scheduled:  yes   This refill cannot be delegated    Requested Prescriptions  Pending Prescriptions Disp Refills   cyclobenzaprine (FLEXERIL) 10 MG tablet [Pharmacy Med Name: CYCLOBENZAPRINE 10MG  TABLETS] 60 tablet 0    Sig: TAKE 1 TABLET(10 MG) BY MOUTH TWICE DAILY AS NEEDED      Not Delegated - Analgesics:  Muscle Relaxants Failed - 09/11/2020 12:38 PM      Failed - This refill cannot be delegated      Passed - Valid encounter within last 6 months    Recent Outpatient Visits           3 weeks ago 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, without long-term current use of insulin ([place])   [place], Megan P, DO   3 months ago Preoperative clearance   [facility] [place], Megan P, DO   5 months ago 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, without long-term current use of insulin ([place])   [place], Megan P, DO   7 months ago 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, without long-term current use of insulin ([place])   [place], [place], [place]   11 months ago 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, without long-term current use of insulin ([place])   [place], [place], DO       Future Appointments             In 2 months [name], Megan P, DO [facility], [facility]             Signed Prescriptions Disp Refills   cetirizine (ZYRTEC) 10 MG tablet 90 tablet 0    Sig: TAKE 1 TABLET(10 MG) BY MOUTH DAILY      Ear, Nose, and Throat:  Antihistamines Passed - 09/11/2020 12:38 PM      Passed - Valid encounter within last 12 months    Recent Outpatient Visits           3 weeks ago  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, without long-term current use of insulin ([place])   [place], Megan P, DO   3 months ago Preoperative clearance   [facility] [place], Megan P, DO   5 months ago 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, without long-term current use of insulin ([place])   [place], Megan P, DO   7 months ago 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, without long-term current use of insulin [facility])   [place], [place], [place]   11 months ago 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, without long-term current use of insulin ([place])   [place], [place], DO       Future Appointments             In 2 months [name], Barb Merino, DO [facility], [facility]

## 2020-09-14 NOTE — Telephone Encounter (Signed)
Routing to provider  

## 2020-10-07 ENCOUNTER — Emergency Department
Admission: EM | Admit: 2020-10-07 | Discharge: 2020-10-07 | Disposition: A | Discharge status: Home / Self Care | Payer: BC Managed Care – PPO | Attending: Emergency Medicine | Admitting: Emergency Medicine

## 2020-10-07 ENCOUNTER — Other Ambulatory Visit: Payer: Self-pay

## 2020-10-07 ENCOUNTER — Encounter: Payer: Self-pay | Admitting: Emergency Medicine

## 2020-10-07 DIAGNOSIS — Z79899 Other long term (current) drug therapy: Secondary | ICD-10-CM | POA: Diagnosis not present

## 2020-10-07 DIAGNOSIS — F17210 Nicotine dependence, cigarettes, uncomplicated: Secondary | ICD-10-CM | POA: Diagnosis not present

## 2020-10-07 DIAGNOSIS — X500XXA Overexertion from strenuous movement or load, initial encounter: Secondary | ICD-10-CM | POA: Insufficient documentation

## 2020-10-07 DIAGNOSIS — J45909 Unspecified asthma, uncomplicated: Secondary | ICD-10-CM | POA: Diagnosis not present

## 2020-10-07 DIAGNOSIS — S4992XA Unspecified injury of left shoulder and upper arm, initial encounter: Secondary | ICD-10-CM | POA: Diagnosis present

## 2020-10-07 DIAGNOSIS — I129 Hypertensive chronic kidney disease with stage 1 through stage 4 chronic kidney disease, or unspecified chronic kidney disease: Secondary | ICD-10-CM | POA: Insufficient documentation

## 2020-10-07 DIAGNOSIS — I2510 Atherosclerotic heart disease of native coronary artery without angina pectoris: Secondary | ICD-10-CM | POA: Diagnosis not present

## 2020-10-07 DIAGNOSIS — Z7984 Long term (current) use of oral hypoglycemic drugs: Secondary | ICD-10-CM | POA: Diagnosis not present

## 2020-10-07 DIAGNOSIS — E1122 Type 2 diabetes mellitus with diabetic chronic kidney disease: Secondary | ICD-10-CM | POA: Insufficient documentation

## 2020-10-07 DIAGNOSIS — S46912A Strain of unspecified muscle, fascia and tendon at shoulder and upper arm level, left arm, initial encounter: Secondary | ICD-10-CM | POA: Insufficient documentation

## 2020-10-07 DIAGNOSIS — N189 Chronic kidney disease, unspecified: Secondary | ICD-10-CM | POA: Diagnosis not present

## 2020-10-07 MED ORDER — KETOROLAC TROMETHAMINE 30 MG/ML IJ SOLN
30.0000 mg | Freq: Once | INTRAMUSCULAR | Status: AC
  Administered 2020-10-07: 11:00:00 30 mg via INTRAMUSCULAR
  Filled 2020-10-07: qty 1

## 2020-10-07 MED ORDER — HYDROCODONE-ACETAMINOPHEN 5-325 MG PO TABS: 1.0000 | ORAL_TABLET | Freq: Four times a day (QID) | ORAL | 0 refills | Status: DC | PRN

## 2020-10-07 MED ORDER — NAPROXEN 500 MG PO TABS: 500.0000 mg | ORAL_TABLET | Freq: Two times a day (BID) | ORAL | 0 refills | Status: DC

## 2020-10-07 NOTE — ED Triage Notes (Signed)
Pt in via POV, sent over from [facility] due to left arm pain, states pain starts in shoulder with radiation down through hand, worse with certain movements.  Reports moving furniture yesterday, pain resulting from that.  Vitals WDL, NAD noted at this time.

## 2020-10-07 NOTE — Discharge Instructions (Signed)
Follow-up with your primary care provider if any continued problems or concerns.  You may use ice or heat to your muscles as needed for discomfort.  The Toradol shot that you were given in the emergency department is an anti-inflammatory pain medication does not cause drowsiness.  A prescription for naproxen was sent to your pharmacy.  This medication is twice a day with food for inflammation and pain.  A separate prescription for pain was sent if you are not able to get comfortable at night or for periods when it is increased pain.  Wear sling for the next 2 days for support and protection.  If any worsening of your symptoms return to the emergency department.
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
## 2020-10-13 ENCOUNTER — Ambulatory Visit: Payer: BC Managed Care – PPO | Admitting: Orthopaedic Surgery

## 2020-10-29 ENCOUNTER — Telehealth (INDEPENDENT_AMBULATORY_CARE_PROVIDER_SITE_OTHER): Payer: Self-pay | Admitting: Family Medicine

## 2020-10-29 ENCOUNTER — Encounter: Payer: Self-pay | Admitting: Family Medicine

## 2020-10-29 ENCOUNTER — Other Ambulatory Visit: Payer: Self-pay

## 2020-10-29 DIAGNOSIS — Z20822 Contact with and (suspected) exposure to covid-19: Secondary | ICD-10-CM

## 2020-10-29 MED ORDER — HYDROCOD POLST-CPM POLST ER 10-8 MG/5ML PO SUER: 5.0000 mL | Freq: Two times a day (BID) | ORAL | 0 refills | Status: DC | PRN

## 2020-10-29 MED ORDER — BENZONATATE 200 MG PO CAPS: 200.0000 mg | ORAL_CAPSULE | Freq: Two times a day (BID) | ORAL | 0 refills | Status: DC | PRN

## 2020-10-29 MED ORDER — PREDNISONE 50 MG PO TABS: 50.0000 mg | ORAL_TABLET | Freq: Every day | ORAL | 0 refills | Status: DC

## 2020-10-29 NOTE — Progress Notes (Signed)
There were no vitals taken for this visit.   Subjective:    Patient ID: [name], female    DOB: January 13, 1976, 45 y.o.   MRN: [id number]  HPI: [name] is a 45 y.o. female  Chief Complaint  Patient presents with  . Covid Exposure    Pt states since yesterday she has been having diarrhea, severe headaches, and body sometimes is really cold or really hot. Has not been able to check temperature, patient has a slight cough and nasal congestion.    UPPER RESPIRATORY TRACT INFECTION Duration: last night Worst symptom: fatigue, congestion Fever: unknown, chills and sweats Cough: yes Shortness of breath: yes Wheezing: no Chest pain: no Chest tightness: yes Chest congestion: yes Nasal congestion: yes Runny nose: yes Post nasal drip: yes Sneezing: yes Sore throat: no Swollen glands: no Sinus pressure: yes Headache: yes Face pain: no Toothache: no Ear pain: no  Ear pressure: no  Eyes red/itching:no Eye drainage/crusting: yes  Vomiting: no  Diarrhea: yes Rash: no Fatigue: yes Sick contacts: yes Strep contacts: no  Context: better Recurrent sinusitis: no Relief with OTC cold/cough medications: no  Treatments attempted: ginger, pedialyte   Relevant past medical, surgical, family and social history reviewed and updated as indicated. Interim medical history since our last visit reviewed. Allergies and medications reviewed and updated.  Review of Systems  Constitutional: Positive for chills, diaphoresis and fatigue. Negative for activity change, appetite change, fever and unexpected weight change.  HENT: Positive for congestion, rhinorrhea, sinus pressure and sinus pain. Negative for dental problem, drooling, ear discharge, ear pain, facial swelling, hearing loss, mouth sores, nosebleeds, postnasal drip, sneezing, sore throat, tinnitus, trouble swallowing and voice change.   Respiratory: Positive for cough and shortness of breath. Negative for  apnea, choking, chest tightness, wheezing and stridor.   Cardiovascular: Negative.   Gastrointestinal: Negative.   Psychiatric/Behavioral: Negative.     Per HPI unless specifically indicated above     Objective:    There were no vitals taken for this visit.  Wt Readings from Last 3 Encounters:  10/07/20 250 lb (113.4 kg)  08/20/20 235 lb (106.6 kg)  08/03/20 246 lb (111.6 kg)    Physical Exam Vitals and nursing note reviewed.  Constitutional:      General: She is not in acute distress.    Appearance: Normal appearance. She is not ill-appearing, toxic-appearing or diaphoretic.  HENT:     Head: Normocephalic and atraumatic.     Right Ear: External ear normal.     Left Ear: External ear normal.     Nose: Nose normal.     Mouth/Throat:     Mouth: Mucous membranes are moist.     Pharynx: Oropharynx is clear.  Eyes:     General: No scleral icterus.       Right eye: No discharge.        Left eye: No discharge.     Conjunctiva/sclera: Conjunctivae normal.     Pupils: Pupils are equal, round, and reactive to light.  Pulmonary:     Effort: Pulmonary effort is normal. No respiratory distress.     Comments: Speaking in full sentences Musculoskeletal:        General: Normal range of motion.     Cervical back: Normal range of motion.  Skin:    Coloration: Skin is not jaundiced or pale.     Findings: No bruising, erythema, lesion or rash.  Neurological:     Mental Status: She is alert and  oriented to person, place, and time. Mental status is at baseline.  Psychiatric:        Mood and Affect: Mood normal.        Behavior: Behavior normal.        Thought Content: Thought content normal.        Judgment: Judgment normal.     Results for orders placed or performed in visit on 08/26/20  HM DIABETES EYE EXAM  Result Value Ref Range   HM Diabetic Eye Exam No Retinopathy No Retinopathy      Assessment & Plan:   Problem List Items Addressed This Visit   None   Visit  Diagnoses    Exposure to COVID-19 virus    -  Primary   Will get swabbed. Self-quarantine until results are back. Will treat symptoms with prednisone, tussionex and tessalon perles. Call with concerns.        Follow up plan: Return if symptoms worsen or fail to improve.   . This visit was completed via MyChart due to the restrictions of the COVID-19 pandemic. All issues as above were discussed and addressed. Physical exam was done as above through visual confirmation on MyChart. If it was felt that the patient should be evaluated in the office, they were directed there. The patient verbally consented to this visit. . Location of the patient: home . Location of the provider: home . Those involved with this call:  . Provider: Park Liter, DO . CMA: Louanna Raw, [place] . Front Desk/Registration: Jill Side  . Time spent on call: 15 minutes with patient face to face via video conference. More than 50% of this time was spent in counseling and coordination of care. 23 minutes total spent in review of patient's record and preparation of their chart.

## 2020-10-31 LAB — NOVEL CORONAVIRUS, NAA: SARS-CoV-2, NAA: NOT DETECTED

## 2020-10-31 LAB — SARS-COV-2, NAA 2 DAY TAT

## 2020-11-18 ENCOUNTER — Other Ambulatory Visit: Payer: Self-pay | Admitting: Family Medicine

## 2020-11-22 ENCOUNTER — Other Ambulatory Visit: Payer: Self-pay | Admitting: Family Medicine

## 2020-11-26 ENCOUNTER — Encounter: Payer: Self-pay | Admitting: Family Medicine

## 2020-11-26 ENCOUNTER — Other Ambulatory Visit: Payer: Self-pay

## 2020-11-26 ENCOUNTER — Ambulatory Visit: Payer: BC Managed Care – PPO | Admitting: Family Medicine

## 2020-11-26 VITALS — BP 127/85 | HR 108 | Temp 98.3°F | Wt 255.6 lb

## 2020-11-26 DIAGNOSIS — D500 Iron deficiency anemia secondary to blood loss (chronic): Secondary | ICD-10-CM

## 2020-11-26 DIAGNOSIS — F3170 Bipolar disorder, currently in remission, most recent episode unspecified: Secondary | ICD-10-CM

## 2020-11-26 DIAGNOSIS — R5382 Chronic fatigue, unspecified: Secondary | ICD-10-CM | POA: Diagnosis not present

## 2020-11-26 DIAGNOSIS — I129 Hypertensive chronic kidney disease with stage 1 through stage 4 chronic kidney disease, or unspecified chronic kidney disease: Secondary | ICD-10-CM

## 2020-11-26 DIAGNOSIS — E1122 Type 2 diabetes mellitus with diabetic chronic kidney disease: Secondary | ICD-10-CM

## 2020-11-26 DIAGNOSIS — N181 Chronic kidney disease, stage 1: Secondary | ICD-10-CM

## 2020-11-26 DIAGNOSIS — R0683 Snoring: Secondary | ICD-10-CM

## 2020-11-26 LAB — BAYER DCA HB A1C WAIVED: HB A1C (BAYER DCA - WAIVED): 7.3 % — ABNORMAL HIGH (ref <7.0)

## 2020-11-26 MED ORDER — LISINOPRIL 5 MG PO TABS
ORAL_TABLET | ORAL | 1 refills | Status: DC
Start: 2020-11-26 — End: 2021-05-23

## 2020-11-26 MED ORDER — CYCLOBENZAPRINE HCL 10 MG PO TABS: 10.0000 mg | ORAL_TABLET | Freq: Every day | ORAL | 6 refills | Status: DC

## 2020-11-26 MED ORDER — TRAZODONE HCL 150 MG PO TABS: 150.0000 mg | ORAL_TABLET | Freq: Every day | ORAL | 1 refills | Status: DC

## 2020-11-26 MED ORDER — METFORMIN HCL 500 MG PO TABS: 500.0000 mg | ORAL_TABLET | Freq: Two times a day (BID) | ORAL | 1 refills | Status: DC

## 2020-11-26 NOTE — Progress Notes (Signed)
BP 127/85   Pulse (!) 108   Temp 98.3 F (36.8 C)   Wt 255 lb 9.6 oz (115.9 kg)   SpO2 97%   BMI 34.67 kg/m    Subjective:    Patient ID: [name], female    DOB: Jul 05, 1976, 45 y.o.   MRN: [id number]  HPI: [name] is a 45 y.o. female  Chief Complaint  Patient presents with  . Diabetes  . iron deficiency anemia  . Muscle Pain    Patient needs a refill for cyclobenzaprine    DIABETES Hypoglycemic episodes:no Polydipsia/polyuria: no Visual disturbance: no Chest pain: no Paresthesias: no Glucose Monitoring: no  Accucheck frequency: Not Checking Taking Insulin?: no Blood Pressure Monitoring: not checking Retinal Examination: Up to Date Foot Exam: Up to Date Diabetic Education: Completed Pneumovax: Up to Date Influenza: Up to Date Aspirin: no  HYPERTENSION Hypertension status: controlled  Satisfied with current treatment? yes Duration of hypertension: chronic BP monitoring frequency:  not checking BP medication side effects:  no Medication compliance: excellent compliance Aspirin: no Recurrent headaches: no Visual changes: no Palpitations: no Dyspnea: no Chest pain: no Lower extremity edema: no Dizzy/lightheaded: no  ANEMIA Anemia status: uncontrolled Etiology of anemia: iron def Duration of anemia treatment: chronic Compliance with treatment: excellent compliance Iron supplementation side effects: no Severity of anemia: moderate Fatigue: yes Decreased exercise tolerance: yes  Dyspnea on exertion: yes Palpitations: yes Bleeding: no Pica: no   Has been really tired and has not been sure what's been going on. She has a lot of stuff going on, considering adopting a son, she is not sure if this is due to her mood or if something else is going on.   ???SLEEP APNEA Sleep apnea status: unsure Duration: chronic Satisfied with current treatment?:  no CPAP use:  N/A Wakes feeling refreshed:  no Daytime  hypersomnolence:  yes Fatigue:  yes Insomnia:  no Good sleep hygiene:  yes Difficulty falling asleep:  no Difficulty staying asleep:  no Snoring bothers bed partner:  yes Observed apnea by bed partner: no Obesity:  yes Hypertension: yes  Pulmonary hypertension:  no Coronary artery disease:  yes   Relevant past medical, surgical, family and social history reviewed and updated as indicated. Interim medical history since our last visit reviewed. Allergies and medications reviewed and updated.  Review of Systems  Constitutional: Negative.   Respiratory: Negative.   Cardiovascular: Negative.   Gastrointestinal: Negative.   Musculoskeletal: Negative.   Skin: Negative.   Neurological: Negative.   Psychiatric/Behavioral: Positive for dysphoric mood. Negative for agitation, behavioral problems, confusion, decreased concentration, hallucinations, self-injury, sleep disturbance and suicidal ideas. The patient is nervous/anxious. The patient is not hyperactive.     Per HPI unless specifically indicated above     Objective:    BP 127/85   Pulse (!) 108   Temp 98.3 F (36.8 C)   Wt 255 lb 9.6 oz (115.9 kg)   SpO2 97%   BMI 34.67 kg/m   Wt Readings from Last 3 Encounters:  11/26/20 255 lb 9.6 oz (115.9 kg)  10/07/20 250 lb (113.4 kg)  08/20/20 235 lb (106.6 kg)    Physical Exam Vitals and nursing note reviewed.  Constitutional:      General: She is not in acute distress.    Appearance: Normal appearance. She is not ill-appearing, toxic-appearing or diaphoretic.  HENT:     Head: Normocephalic and atraumatic.     Right Ear: External ear normal.  Left Ear: External ear normal.     Nose: Nose normal.     Mouth/Throat:     Mouth: Mucous membranes are moist.     Pharynx: Oropharynx is clear.  Eyes:     General: No scleral icterus.       Right eye: No discharge.        Left eye: No discharge.     Extraocular Movements: Extraocular movements intact.     Conjunctiva/sclera:  Conjunctivae normal.     Pupils: Pupils are equal, round, and reactive to light.  Cardiovascular:     Rate and Rhythm: Normal rate and regular rhythm.     Pulses: Normal pulses.     Heart sounds: Normal heart sounds. No murmur heard. No friction rub. No gallop.   Pulmonary:     Effort: Pulmonary effort is normal. No respiratory distress.     Breath sounds: Normal breath sounds. No stridor. No wheezing, rhonchi or rales.  Chest:     Chest wall: No tenderness.  Musculoskeletal:        General: Normal range of motion.     Cervical back: Normal range of motion and neck supple.  Skin:    General: Skin is warm and dry.     Capillary Refill: Capillary refill takes less than 2 seconds.     Coloration: Skin is not jaundiced or pale.     Findings: No bruising, erythema, lesion or rash.  Neurological:     General: No focal deficit present.     Mental Status: She is alert and oriented to person, place, and time. Mental status is at baseline.  Psychiatric:        Mood and Affect: Mood is depressed.        Behavior: Behavior normal.        Thought Content: Thought content normal.        Judgment: Judgment normal.     Results for orders placed or performed in visit on 11/26/20  Bayer DCA Hb A1c Waived  Result Value Ref Range   HB A1C (BAYER DCA - WAIVED) 7.3 (H) <7.0 %  CBC with Differential/Platelet  Result Value Ref Range   WBC 9.7 3.4 - 10.8 x10E3/uL   RBC 5.44 (H) 3.77 - 5.28 x10E6/uL   Hemoglobin 14.9 11.1 - 15.9 g/dL   Hematocrit 44.0 34.0 - 46.6 %   MCV 81 79 - 97 fL   MCH 27.4 26.6 - 33.0 pg   MCHC 33.9 31.5 - 35.7 g/dL   RDW 16.6 (H) 11.7 - 15.4 %   Platelets 190 150 - 450 x10E3/uL   Neutrophils 55 Not Estab. %   Lymphs 36 Not Estab. %   Monocytes 7 Not Estab. %   Eos 2 Not Estab. %   Basos 0 Not Estab. %   Neutrophils Absolute 5.3 1.4 - 7.0 x10E3/uL   Lymphocytes Absolute 3.5 (H) 0.7 - 3.1 x10E3/uL   Monocytes Absolute 0.7 0.1 - 0.9 x10E3/uL   EOS (ABSOLUTE) 0.2 0.0 -  0.4 x10E3/uL   Basophils Absolute 0.0 0.0 - 0.2 x10E3/uL   Immature Granulocytes 0 Not Estab. %   Immature Grans (Abs) 0.0 0.0 - 0.1 x10E3/uL  Comprehensive metabolic panel  Result Value Ref Range   Glucose 171 (H) 65 - 99 mg/dL   BUN 14 6 - 24 mg/dL   Creatinine, Ser 0.80 0.57 - 1.00 mg/dL   GFR calc non Af Amer 91 >59 mL/min/1.73   GFR calc Af Amer 104 >59 mL/min/1.73  BUN/Creatinine Ratio 18 9 - 23   Sodium 140 134 - 144 mmol/L   Potassium 4.4 3.5 - 5.2 mmol/L   Chloride 104 96 - 106 mmol/L   CO2 17 (L) 20 - 29 mmol/L   Calcium 10.0 8.7 - 10.2 mg/dL   Total Protein 7.5 6.0 - 8.5 g/dL   Albumin 4.9 (H) 3.8 - 4.8 g/dL   Globulin, Total 2.6 1.5 - 4.5 g/dL   Albumin/Globulin Ratio 1.9 1.2 - 2.2   Bilirubin Total <0.2 0.0 - 1.2 mg/dL   Alkaline Phosphatase 85 44 - 121 IU/L   AST 24 0 - 40 IU/L   ALT 33 (H) 0 - 32 IU/L  Lipid Panel w/o Chol/HDL Ratio  Result Value Ref Range   Cholesterol, Total 155 100 - 199 mg/dL   Triglycerides 426 (H) 0 - 149 mg/dL   HDL 29 (L) >39 mg/dL   VLDL Cholesterol Cal 65 (H) 5 - 40 mg/dL   LDL Chol Calc (NIH) 61 0 - 99 mg/dL  Iron and TIBC  Result Value Ref Range   Total Iron Binding Capacity 303 250 - 450 ug/dL   UIBC 250 131 - 425 ug/dL   Iron 53 27 - 159 ug/dL   Iron Saturation 17 15 - 55 %  Ferritin  Result Value Ref Range   Ferritin 126 15 - 150 ng/mL  TSH  Result Value Ref Range   TSH 1.460 0.450 - 4.500 uIU/mL  VITAMIN D 25 Hydroxy (Vit-D Deficiency, Fractures)  Result Value Ref Range   Vit D, 25-Hydroxy 15.3 (L) 30.0 - 100.0 ng/mL      Assessment & Plan:   Problem List Items Addressed This Visit      Endocrine   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 Call with any concerns.       Relevant Medications   lisinopril (ZESTRIL) 5 MG tablet   metFORMIN (GLUCOPHAGE) 500 MG tablet   Other  Relevant Orders   Bayer DCA Hb A1c Waived (Completed)   Comprehensive metabolic panel (Completed)   Lipid Panel w/o Chol/HDL Ratio (Completed)     Genitourinary   Benign hypertensive renal disease    Under good control on current regimen. Continue current regimen. Continue to monitor. Call with any concerns. Refills given. Labs drawn today.        Relevant Orders   Comprehensive metabolic panel (Completed)     Other   Bipolar disorder ([place])    Unclear if her fatigue is due to her mood or due to something organic. Continue to follow with psychiatry. Call with any concerns. Continue to monitor.       Iron deficiency anemia due to chronic blood loss    Will recheck labs today. Await results. Call with any concerns. Continue to monitor.       Relevant Orders   CBC with Differential/Platelet (Completed)   Comprehensive metabolic panel (Completed)   Iron and TIBC (Completed)   Ferritin (Completed)    Other Visit Diagnoses    Chronic fatigue       Unsure if due to her mood or if there is something organic going on. Will get her set up for sleep study to look for OSA. Await results. Treat as needed.    Relevant Orders   TSH (Completed)   VITAMIN D 25 Hydroxy (Vit-D Deficiency,  Fractures) (Completed)   Ambulatory referral to Sleep Studies   Snoring       Will get her set up for sleep study to look for OSA. Await results. Treat as needed.    Relevant Orders   Ambulatory referral to Sleep Studies       Follow up plan: Return in about 4 weeks (around 12/24/2020).

## 2020-11-27 LAB — COMPREHENSIVE METABOLIC PANEL
ALT: 33 IU/L — ABNORMAL HIGH (ref 0–32)
AST: 24 IU/L (ref 0–40)
Albumin/Globulin Ratio: 1.9 (ref 1.2–2.2)
Albumin: 4.9 g/dL — ABNORMAL HIGH (ref 3.8–4.8)
Alkaline Phosphatase: 85 IU/L (ref 44–121)
BUN/Creatinine Ratio: 18 (ref 9–23)
BUN: 14 mg/dL (ref 6–24)
Bilirubin Total: <0.2 mg/dL (ref 0.0–1.2)
CO2: 17 mmol/L — ABNORMAL LOW (ref 20–29)
Calcium: 10 mg/dL (ref 8.7–10.2)
Chloride: 104 mmol/L (ref 96–106)
Creatinine, Ser: 0.8 mg/dL (ref 0.57–1.00)
GFR calc Af Amer: 104 mL/min/{1.73_m2} (ref >59)
GFR calc non Af Amer: 91 mL/min/{1.73_m2} (ref >59)
Globulin, Total: 2.6 g/dL (ref 1.5–4.5)
Glucose: 171 mg/dL — ABNORMAL HIGH (ref 65–99)
Potassium: 4.4 mmol/L (ref 3.5–5.2)
Sodium: 140 mmol/L (ref 134–144)
Total Protein: 7.5 g/dL (ref 6.0–8.5)

## 2020-11-27 LAB — CBC WITH DIFFERENTIAL/PLATELET
Basophils Absolute: 0 10*3/uL (ref 0.0–0.2)
Basos: 0 %
EOS (ABSOLUTE): 0.2 10*3/uL (ref 0.0–0.4)
Eos: 2 %
Hematocrit: 44 % (ref 34.0–46.6)
Hemoglobin: 14.9 g/dL (ref 11.1–15.9)
Immature Grans (Abs): 0 10*3/uL (ref 0.0–0.1)
Immature Granulocytes: 0 %
Lymphocytes Absolute: 3.5 10*3/uL — ABNORMAL HIGH (ref 0.7–3.1)
Lymphs: 36 %
MCH: 27.4 pg (ref 26.6–33.0)
MCHC: 33.9 g/dL (ref 31.5–35.7)
MCV: 81 fL (ref 79–97)
Monocytes Absolute: 0.7 10*3/uL (ref 0.1–0.9)
Monocytes: 7 %
Neutrophils Absolute: 5.3 10*3/uL (ref 1.4–7.0)
Neutrophils: 55 %
Platelets: 190 10*3/uL (ref 150–450)
RBC: 5.44 x10E6/uL — ABNORMAL HIGH (ref 3.77–5.28)
RDW: 16.6 % — ABNORMAL HIGH (ref 11.7–15.4)
WBC: 9.7 10*3/uL (ref 3.4–10.8)

## 2020-11-27 LAB — IRON AND TIBC
Iron Saturation: 17 % (ref 15–55)
Iron: 53 ug/dL (ref 27–159)
Total Iron Binding Capacity: 303 ug/dL (ref 250–450)
UIBC: 250 ug/dL (ref 131–425)

## 2020-11-27 LAB — LIPID PANEL W/O CHOL/HDL RATIO
Cholesterol, Total: 155 mg/dL (ref 100–199)
HDL: 29 mg/dL — ABNORMAL LOW (ref >39)
LDL Chol Calc (NIH): 61 mg/dL (ref 0–99)
Triglycerides: 426 mg/dL — ABNORMAL HIGH (ref 0–149)
VLDL Cholesterol Cal: 65 mg/dL — ABNORMAL HIGH (ref 5–40)

## 2020-11-27 LAB — FERRITIN: Ferritin: 126 ng/mL (ref 15–150)

## 2020-11-28 ENCOUNTER — Other Ambulatory Visit: Payer: Self-pay | Admitting: Family Medicine

## 2020-11-28 LAB — TSH: TSH: 1.46 u[IU]/mL (ref 0.450–4.500)

## 2020-11-28 LAB — VITAMIN D 25 HYDROXY (VIT D DEFICIENCY, FRACTURES): Vit D, 25-Hydroxy: 15.3 ng/mL — ABNORMAL LOW (ref 30.0–100.0)

## 2020-11-28 MED ORDER — VITAMIN D (ERGOCALCIFEROL) 1.25 MG (50000 UNIT) PO CAPS: 50000.0000 [IU] | ORAL_CAPSULE | ORAL | 0 refills | Status: DC

## 2020-11-29 ENCOUNTER — Encounter: Payer: Self-pay | Admitting: Family Medicine

## 2020-11-29 NOTE — Assessment & Plan Note (Signed)
Unclear if her fatigue is due to her mood or due to something organic. Continue to follow with psychiatry. Call with any concerns. Continue to monitor.

## 2020-11-29 NOTE — Assessment & Plan Note (Signed)
Will recheck labs today. Await results. Call with any concerns. Continue to monitor.

## 2020-11-29 NOTE — Assessment & Plan Note (Signed)
Not doing well with A1c of 7.3 up from 6.2- will increase her metformin to BID and recheck 3 months. Call with any concerns.

## 2020-11-29 NOTE — Assessment & Plan Note (Signed)
Under good control on current regimen. Continue current regimen. Continue to monitor. Call with any concerns. Refills given. Labs drawn today.   

## 2020-12-28 ENCOUNTER — Encounter: Payer: Self-pay | Admitting: Family Medicine

## 2020-12-31 ENCOUNTER — Other Ambulatory Visit: Payer: Self-pay | Admitting: Family Medicine

## 2021-01-15 ENCOUNTER — Encounter: Payer: Self-pay | Admitting: Family Medicine

## 2021-01-15 ENCOUNTER — Other Ambulatory Visit: Payer: Self-pay

## 2021-01-15 ENCOUNTER — Ambulatory Visit: Payer: BC Managed Care – PPO | Admitting: Family Medicine

## 2021-01-15 VITALS — BP 156/92 | HR 106 | Temp 98.1°F | Wt 264.0 lb

## 2021-01-15 DIAGNOSIS — J209 Acute bronchitis, unspecified: Secondary | ICD-10-CM

## 2021-01-15 DIAGNOSIS — J440 Chronic obstructive pulmonary disease with acute lower respiratory infection: Secondary | ICD-10-CM | POA: Diagnosis not present

## 2021-01-15 DIAGNOSIS — R059 Cough, unspecified: Secondary | ICD-10-CM

## 2021-01-15 MED ORDER — TRIAMCINOLONE ACETONIDE 40 MG/ML IJ SUSP
40.0000 mg | Freq: Once | INTRAMUSCULAR | Status: AC
  Administered 2021-01-15: 40 mg via INTRAMUSCULAR

## 2021-01-15 MED ORDER — ANORO ELLIPTA 62.5-25 MCG/INH IN AEPB: 1.0000 | INHALATION_SPRAY | Freq: Every day | RESPIRATORY_TRACT | 3 refills | Status: DC

## 2021-01-15 MED ORDER — PREDNISONE 50 MG PO TABS: 50.0000 mg | ORAL_TABLET | Freq: Every day | ORAL | 0 refills | Status: DC

## 2021-01-15 MED ORDER — DOXYCYCLINE HYCLATE 100 MG PO TABS: 100.0000 mg | ORAL_TABLET | Freq: Two times a day (BID) | ORAL | 0 refills | Status: DC

## 2021-01-15 MED ORDER — MONTELUKAST SODIUM 10 MG PO TABS: 10.0000 mg | ORAL_TABLET | Freq: Every day | ORAL | 3 refills | Status: DC

## 2021-01-15 MED ORDER — HYDROCOD POLST-CPM POLST ER 10-8 MG/5ML PO SUER: 5.0000 mL | Freq: Two times a day (BID) | ORAL | 0 refills | Status: DC | PRN

## 2021-01-15 NOTE — Progress Notes (Signed)
Mild obstruction

## 2021-01-15 NOTE — Addendum Note (Signed)
Addended by: Valerie Roys on: 01/15/2021 04:52 PM   Modules accepted: Orders

## 2021-01-15 NOTE — Progress Notes (Signed)
BP (!) 156/92   Pulse (!) 106   Temp 98.1 F (36.7 C)   Wt 264 lb (119.7 kg)   SpO2 96%   BMI 35.80 kg/m    Subjective:    Patient ID: [name], female    DOB: 05-May-1976, 45 y.o.   MRN: [id number]  HPI: [name] is a 45 y.o. female  Chief Complaint  Patient presents with  . Cough    Patient states she has been having cough for a few weeks. Has been having fever as well.   . Eye Drainage    Patient states her left eye has some crust, eye has been red.   . Ear Fullness   UPPER RESPIRATORY TRACT INFECTION Duration: 5+ weeks Worst symptom: cough Fever: yes (subjective) Cough: yes Shortness of breath: yes Wheezing: yes Chest pain: yes, with cough Chest tightness: yes Chest congestion: yes Nasal congestion: yes Runny nose: yes Post nasal drip: yes Sneezing: no Sore throat: yes Swollen glands: no Sinus pressure: yes Headache: yes Face pain: no Toothache: no Ear pain: no  Ear pressure: yes bilateral Eyes red/itching:yes Eye drainage/crusting: yes  Vomiting: no Rash: no Fatigue: yes Sick contacts: yes Strep contacts: no  Context: worse Recurrent sinusitis: no Relief with OTC cold/cough medications: no  Treatments attempted: cold/sinus, mucinex, anti-histamine and pseudoephedrine  Relevant past medical, surgical, family and social history reviewed and updated as indicated. Interim medical history since our last visit reviewed. Allergies and medications reviewed and updated.  Review of Systems  Constitutional: Positive for chills, diaphoresis and fatigue. Negative for activity change, appetite change, fever and unexpected weight change.  HENT: Positive for congestion, postnasal drip and rhinorrhea. Negative for dental problem, drooling, ear discharge, ear pain, facial swelling, hearing loss, mouth sores, nosebleeds, sinus pressure, sinus pain, sneezing, sore throat, tinnitus, trouble swallowing and voice change.   Respiratory:  Positive for cough, chest tightness and shortness of breath. Negative for apnea, choking, wheezing and stridor.   Cardiovascular: Negative.   Gastrointestinal: Negative.   Musculoskeletal: Negative.   Psychiatric/Behavioral: Negative.     Per HPI unless specifically indicated above     Objective:    BP (!) 156/92   Pulse (!) 106   Temp 98.1 F (36.7 C)   Wt 264 lb (119.7 kg)   SpO2 96%   BMI 35.80 kg/m   Wt Readings from Last 3 Encounters:  01/15/21 264 lb (119.7 kg)  11/26/20 255 lb 9.6 oz (115.9 kg)  10/07/20 250 lb (113.4 kg)    Physical Exam Vitals and nursing note reviewed.  Constitutional:      General: She is not in acute distress.    Appearance: Normal appearance. She is obese. She is not ill-appearing, toxic-appearing or diaphoretic.  HENT:     Head: Normocephalic and atraumatic.     Right Ear: Tympanic membrane, ear canal and external ear normal.     Left Ear: Tympanic membrane, ear canal and external ear normal.     Nose: Nose normal.     Mouth/Throat:     Mouth: Mucous membranes are moist.     Pharynx: Oropharynx is clear.  Eyes:     General: No scleral icterus.       Right eye: No discharge.        Left eye: No discharge.     Extraocular Movements: Extraocular movements intact.     Conjunctiva/sclera: Conjunctivae normal.     Pupils: Pupils are equal, round, and reactive to light.  Cardiovascular:  Rate and Rhythm: Normal rate and regular rhythm.     Pulses: Normal pulses.     Heart sounds: Normal heart sounds. No murmur heard. No friction rub. No gallop.   Pulmonary:     Effort: Pulmonary effort is normal. No respiratory distress.     Breath sounds: Normal breath sounds. No stridor. No wheezing, rhonchi or rales.  Chest:     Chest wall: No tenderness.  Musculoskeletal:        General: Normal range of motion.     Cervical back: Normal range of motion and neck supple.  Skin:    General: Skin is warm and dry.     Capillary Refill: Capillary  refill takes less than 2 seconds.     Coloration: Skin is not jaundiced or pale.     Findings: No bruising, erythema, lesion or rash.  Neurological:     General: No focal deficit present.     Mental Status: She is alert and oriented to person, place, and time. Mental status is at baseline.  Psychiatric:        Mood and Affect: Mood normal.        Behavior: Behavior normal.        Thought Content: Thought content normal.        Judgment: Judgment normal.     Results for orders placed or performed in visit on 11/26/20  Bayer DCA Hb A1c Waived  Result Value Ref Range   HB A1C (BAYER DCA - WAIVED) 7.3 (H) <7.0 %  CBC with Differential/Platelet  Result Value Ref Range   WBC 9.7 3.4 - 10.8 x10E3/uL   RBC 5.44 (H) 3.77 - 5.28 x10E6/uL   Hemoglobin 14.9 11.1 - 15.9 g/dL   Hematocrit 44.0 34.0 - 46.6 %   MCV 81 79 - 97 fL   MCH 27.4 26.6 - 33.0 pg   MCHC 33.9 31.5 - 35.7 g/dL   RDW 16.6 (H) 11.7 - 15.4 %   Platelets 190 150 - 450 x10E3/uL   Neutrophils 55 Not Estab. %   Lymphs 36 Not Estab. %   Monocytes 7 Not Estab. %   Eos 2 Not Estab. %   Basos 0 Not Estab. %   Neutrophils Absolute 5.3 1.4 - 7.0 x10E3/uL   Lymphocytes Absolute 3.5 (H) 0.7 - 3.1 x10E3/uL   Monocytes Absolute 0.7 0.1 - 0.9 x10E3/uL   EOS (ABSOLUTE) 0.2 0.0 - 0.4 x10E3/uL   Basophils Absolute 0.0 0.0 - 0.2 x10E3/uL   Immature Granulocytes 0 Not Estab. %   Immature Grans (Abs) 0.0 0.0 - 0.1 x10E3/uL  Comprehensive metabolic panel  Result Value Ref Range   Glucose 171 (H) 65 - 99 mg/dL   BUN 14 6 - 24 mg/dL   Creatinine, Ser 0.80 0.57 - 1.00 mg/dL   GFR calc non Af Amer 91 >59 mL/min/1.73   GFR calc Af Amer 104 >59 mL/min/1.73   BUN/Creatinine Ratio 18 9 - 23   Sodium 140 134 - 144 mmol/L   Potassium 4.4 3.5 - 5.2 mmol/L   Chloride 104 96 - 106 mmol/L   CO2 17 (L) 20 - 29 mmol/L   Calcium 10.0 8.7 - 10.2 mg/dL   Total Protein 7.5 6.0 - 8.5 g/dL   Albumin 4.9 (H) 3.8 - 4.8 g/dL   Globulin, Total 2.6 1.5 -  4.5 g/dL   Albumin/Globulin Ratio 1.9 1.2 - 2.2   Bilirubin Total <0.2 0.0 - 1.2 mg/dL   Alkaline Phosphatase 85 44 - 121  IU/L   AST 24 0 - 40 IU/L   ALT 33 (H) 0 - 32 IU/L  Lipid Panel w/o Chol/HDL Ratio  Result Value Ref Range   Cholesterol, Total 155 100 - 199 mg/dL   Triglycerides 426 (H) 0 - 149 mg/dL   HDL 29 (L) >39 mg/dL   VLDL Cholesterol Cal 65 (H) 5 - 40 mg/dL   LDL Chol Calc (NIH) 61 0 - 99 mg/dL  Iron and TIBC  Result Value Ref Range   Total Iron Binding Capacity 303 250 - 450 ug/dL   UIBC 250 131 - 425 ug/dL   Iron 53 27 - 159 ug/dL   Iron Saturation 17 15 - 55 %  Ferritin  Result Value Ref Range   Ferritin 126 15 - 150 ng/mL  TSH  Result Value Ref Range   TSH 1.460 0.450 - 4.500 uIU/mL  VITAMIN D 25 Hydroxy (Vit-D Deficiency, Fractures)  Result Value Ref Range   Vit D, 25-Hydroxy 15.3 (L) 30.0 - 100.0 ng/mL      Assessment & Plan:   Problem List Items Addressed This Visit   None   Visit Diagnoses    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 Start anoro and singulair after prednisone. Recheck 2 weeks.    Relevant Medications   umeclidinium-vilanterol (ANORO ELLIPTA) 62.5-25 MCG/INH AEPB   triamcinolone acetonide (KENALOG-40) injection 40 mg (Start on 01/15/2021  4:15 PM)   predniSONE (DELTASONE) 50 MG tablet   montelukast (SINGULAIR) 10 MG tablet   Cough       Relevant Medications   triamcinolone acetonide (KENALOG-40) injection 40 mg (Start on 01/15/2021  4:15 PM)   Other Relevant Orders   Spirometry with graph (Completed)       Follow up plan: Return in about 2 weeks (around 01/29/2021).

## 2021-02-02 ENCOUNTER — Ambulatory Visit: Payer: BC Managed Care – PPO | Admitting: Family Medicine

## 2021-02-02 ENCOUNTER — Other Ambulatory Visit: Payer: Self-pay

## 2021-02-02 ENCOUNTER — Encounter: Payer: Self-pay | Admitting: Family Medicine

## 2021-02-02 VITALS — BP 113/75 | HR 96 | Temp 98.0°F | Wt 258.8 lb

## 2021-02-02 DIAGNOSIS — J42 Unspecified chronic bronchitis: Secondary | ICD-10-CM

## 2021-02-02 DIAGNOSIS — J449 Chronic obstructive pulmonary disease, unspecified: Secondary | ICD-10-CM | POA: Insufficient documentation

## 2021-02-02 MED ORDER — TRELEGY ELLIPTA 100-62.5-25 MCG/INH IN AEPB: 1.0000 | INHALATION_SPRAY | Freq: Every day | RESPIRATORY_TRACT | 11 refills | Status: DC

## 2021-02-02 MED ORDER — NAPROXEN 500 MG PO TABS: 500.0000 mg | ORAL_TABLET | Freq: Two times a day (BID) | ORAL | 3 refills | Status: DC

## 2021-02-02 MED ORDER — CYCLOBENZAPRINE HCL 10 MG PO TABS: 10.0000 mg | ORAL_TABLET | Freq: Three times a day (TID) | ORAL | 6 refills | Status: DC | PRN

## 2021-02-02 NOTE — Assessment & Plan Note (Signed)
[name] is worse today than previously, although she is feeling better. We will change her from anoro to trelegy and recheck 1 month. Call with any concerns.

## 2021-02-02 NOTE — Progress Notes (Signed)
BP 113/75   Pulse 96   Temp 98 F (36.7 C)   Wt 258 lb 12.8 oz (117.4 kg)   SpO2 97%   BMI 35.10 kg/m    Subjective:    Patient ID: [name], female    DOB: 02/22/1976, 45 y.o.   MRN: [id number]  HPI: [name] is a 45 y.o. female  Chief Complaint  Patient presents with  . Cough    Follow up    [name] notes that she is feeling much better. She is not as tired. She has been breathing a lot better. She has not hada any fevers or chills. She notes that she has been having some coughing when she's laying down at night as well as some occasional wheezing and tightness. She is very happy with how she is feeling and notes that she feels like the anoro has made a big difference. She is otherwise feeling well with no other concerns or complaints at this time.    Relevant past medical, surgical, family and social history reviewed and updated as indicated. Interim medical history since our last visit reviewed. Allergies and medications reviewed and updated.  Review of Systems  Constitutional: Negative.   HENT: Negative.   Respiratory: Positive for cough, chest tightness, shortness of breath and wheezing. Negative for apnea, choking and stridor.   Cardiovascular: Negative.   Gastrointestinal: Negative.   Psychiatric/Behavioral: Negative.     Per HPI unless specifically indicated above     Objective:    BP 113/75   Pulse 96   Temp 98 F (36.7 C)   Wt 258 lb 12.8 oz (117.4 kg)   SpO2 97%   BMI 35.10 kg/m   Wt Readings from Last 3 Encounters:  02/02/21 258 lb 12.8 oz (117.4 kg)  01/15/21 264 lb (119.7 kg)  11/26/20 255 lb 9.6 oz (115.9 kg)    Physical Exam Vitals and nursing note reviewed.  Constitutional:      General: She is not in acute distress.    Appearance: Normal appearance. She is not ill-appearing, toxic-appearing or diaphoretic.  HENT:     Head: Normocephalic and atraumatic.     Right Ear: External ear normal.     Left  Ear: External ear normal.     Nose: Nose normal.     Mouth/Throat:     Mouth: Mucous membranes are moist.     Pharynx: Oropharynx is clear.  Eyes:     General: No scleral icterus.       Right eye: No discharge.        Left eye: No discharge.     Extraocular Movements: Extraocular movements intact.     Conjunctiva/sclera: Conjunctivae normal.     Pupils: Pupils are equal, round, and reactive to light.  Cardiovascular:     Rate and Rhythm: Normal rate and regular rhythm.     Pulses: Normal pulses.     Heart sounds: Normal heart sounds. No murmur heard. No friction rub. No gallop.   Pulmonary:     Effort: Pulmonary effort is normal. No respiratory distress.     Breath sounds: No stridor. Wheezing present. No rhonchi or rales.  Chest:     Chest wall: No tenderness.  Musculoskeletal:        General: Normal range of motion.     Cervical back: Normal range of motion and neck supple.  Skin:    General: Skin is warm and dry.     Capillary Refill: Capillary refill  takes less than 2 seconds.     Coloration: Skin is not jaundiced or pale.     Findings: No bruising, erythema, lesion or rash.  Neurological:     General: No focal deficit present.     Mental Status: She is alert and oriented to person, place, and time. Mental status is at baseline.  Psychiatric:        Mood and Affect: Mood normal.        Behavior: Behavior normal.        Thought Content: Thought content normal.        Judgment: Judgment normal.     Results for orders placed or performed in visit on 11/26/20  Bayer DCA Hb A1c Waived  Result Value Ref Range   HB A1C (BAYER DCA - WAIVED) 7.3 (H) <7.0 %  CBC with Differential/Platelet  Result Value Ref Range   WBC 9.7 3.4 - 10.8 x10E3/uL   RBC 5.44 (H) 3.77 - 5.28 x10E6/uL   Hemoglobin 14.9 11.1 - 15.9 g/dL   Hematocrit 44.0 34.0 - 46.6 %   MCV 81 79 - 97 fL   MCH 27.4 26.6 - 33.0 pg   MCHC 33.9 31.5 - 35.7 g/dL   RDW 16.6 (H) 11.7 - 15.4 %   Platelets 190 150 -  450 x10E3/uL   Neutrophils 55 Not Estab. %   Lymphs 36 Not Estab. %   Monocytes 7 Not Estab. %   Eos 2 Not Estab. %   Basos 0 Not Estab. %   Neutrophils Absolute 5.3 1.4 - 7.0 x10E3/uL   Lymphocytes Absolute 3.5 (H) 0.7 - 3.1 x10E3/uL   Monocytes Absolute 0.7 0.1 - 0.9 x10E3/uL   EOS (ABSOLUTE) 0.2 0.0 - 0.4 x10E3/uL   Basophils Absolute 0.0 0.0 - 0.2 x10E3/uL   Immature Granulocytes 0 Not Estab. %   Immature Grans (Abs) 0.0 0.0 - 0.1 x10E3/uL  Comprehensive metabolic panel  Result Value Ref Range   Glucose 171 (H) 65 - 99 mg/dL   BUN 14 6 - 24 mg/dL   Creatinine, Ser 0.80 0.57 - 1.00 mg/dL   GFR calc non Af Amer 91 >59 mL/min/1.73   GFR calc Af Amer 104 >59 mL/min/1.73   BUN/Creatinine Ratio 18 9 - 23   Sodium 140 134 - 144 mmol/L   Potassium 4.4 3.5 - 5.2 mmol/L   Chloride 104 96 - 106 mmol/L   CO2 17 (L) 20 - 29 mmol/L   Calcium 10.0 8.7 - 10.2 mg/dL   Total Protein 7.5 6.0 - 8.5 g/dL   Albumin 4.9 (H) 3.8 - 4.8 g/dL   Globulin, Total 2.6 1.5 - 4.5 g/dL   Albumin/Globulin Ratio 1.9 1.2 - 2.2   Bilirubin Total <0.2 0.0 - 1.2 mg/dL   Alkaline Phosphatase 85 44 - 121 IU/L   AST 24 0 - 40 IU/L   ALT 33 (H) 0 - 32 IU/L  Lipid Panel w/o Chol/HDL Ratio  Result Value Ref Range   Cholesterol, Total 155 100 - 199 mg/dL   Triglycerides 426 (H) 0 - 149 mg/dL   HDL 29 (L) >39 mg/dL   VLDL Cholesterol Cal 65 (H) 5 - 40 mg/dL   LDL Chol Calc (NIH) 61 0 - 99 mg/dL  Iron and TIBC  Result Value Ref Range   Total Iron Binding Capacity 303 250 - 450 ug/dL   UIBC 250 131 - 425 ug/dL   Iron 53 27 - 159 ug/dL   Iron Saturation 17 15 - 55 %  Ferritin  Result Value Ref Range   Ferritin 126 15 - 150 ng/mL  TSH  Result Value Ref Range   TSH 1.460 0.450 - 4.500 uIU/mL  VITAMIN D 25 Hydroxy (Vit-D Deficiency, Fractures)  Result Value Ref Range   Vit D, 25-Hydroxy 15.3 (L) 30.0 - 100.0 ng/mL      Assessment & Plan:   Problem List Items Addressed This Visit      Respiratory   COPD  (chronic obstructive pulmonary disease) ([place]) - Primary    [name] is worse today than previously, although she is feeling better. We will change her from anoro to trelegy and recheck 1 month. Call with any concerns.       Relevant Medications   Fluticasone-Umeclidin-Vilant (TRELEGY ELLIPTA) 100-62.5-25 MCG/INH AEPB   Other Relevant Orders   Spirometry with graph (Completed)       Follow up plan: Return in about 4 weeks (around 03/02/2021).

## 2021-02-15 ENCOUNTER — Other Ambulatory Visit: Payer: Self-pay | Admitting: Family Medicine

## 2021-02-15 NOTE — Telephone Encounter (Signed)
Approved per protocol.  Requested Prescriptions  Pending Prescriptions Disp Refills  . pantoprazole (PROTONIX) 40 MG tablet [Pharmacy Med Name: PANTOPRAZOLE 40MG  TABLETS] 180 tablet 0    Sig: TAKE 1 TABLET(40 MG) BY MOUTH TWICE DAILY     Gastroenterology: Proton Pump Inhibitors Passed - 02/15/2021 10:04 AM      Passed - Valid encounter within last 12 months    Recent Outpatient Visits          1 week ago Chronic bronchitis, unspecified chronic bronchitis type [facility])   [place], Megan P, DO   1 month ago Acute bronchitis with COPD ([place])   [place], Alderson P, DO   2 months ago 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, without long-term current use of insulin ([place])   [place], Megan P, DO   3 months ago Exposure to COVID-19 virus   [employer], Megan P, DO   5 months ago 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, without long-term current use of insulin ([place])   [place], [place], DO      Future Appointments            In 1 week Wynetta Emery, Barb Merino, DO [employer]

## 2021-02-17 ENCOUNTER — Other Ambulatory Visit: Payer: Self-pay | Admitting: Family Medicine

## 2021-02-17 NOTE — Telephone Encounter (Signed)
Requested medication (s) are due for refill today: yes  Requested medication (s) are on the active medication list: yes  Last refill:  11/28/2020  Future visit scheduled: yes  Notes to clinic: this refill cannot be delegated    Requested Prescriptions  Pending Prescriptions Disp Refills   Vitamin D, Ergocalciferol, (DRISDOL) 1.25 MG (50000 UNIT) CAPS capsule [Pharmacy Med Name: VITAMIN D2 50,000IU (ERGO) CAP RX] 12 capsule 0    Sig: TAKE 1 CAPSULE BY MOUTH EVERY 7 DAYS      Endocrinology:  Vitamins - Vitamin D Supplementation Failed - 02/17/2021 10:34 AM      Failed - 50,000 IU strengths are not delegated      Failed - Phosphate in normal range and within 360 days    No results found for: PHOS        Failed - Vitamin D in normal range and within 360 days    Vit D, 25-Hydroxy  Date Value Ref Range Status  11/26/2020 15.3 (L) 30.0 - 100.0 ng/mL Final    Comment:    Vitamin D deficiency has been defined by the Institute of Medicine and an Endocrine Society practice guideline as a level of serum 25-OH vitamin D less than 20 ng/mL (1,2). The Endocrine Society went on to further define vitamin D insufficiency as a level between 21 and 29 ng/mL (2). 1. IOM (Institute of Medicine). 2010. Dietary reference    intakes for calcium and D. [place]: The    [employer]. 2. Holick MF, Binkley [place], Bischoff-Ferrari HA, et al.    Evaluation, treatment, and prevention of vitamin D    deficiency: an Endocrine Society clinical practice    guideline. JCEM. 2011 Jul; 96(7):1911-30.           Passed - Ca in normal range and within 360 days    Calcium  Date Value Ref Range Status  11/26/2020 10.0 8.7 - 10.2 mg/dL Final   Calcium, Total  Date Value Ref Range Status  04/09/2014 8.8 8.5 - 10.1 mg/dL Final          Passed - Valid encounter within last 12 months    Recent Outpatient Visits           2 weeks ago Chronic bronchitis, unspecified chronic bronchitis type [facility])    [place], Megan P, DO   1 month ago Acute bronchitis with COPD ([place])   [place], [place]embina P, DO   2 months ago 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, without long-term current use of insulin ([place])   [place], Megan P, DO   3 months ago Exposure to COVID-19 virus   [employer], Megan P, DO   6 months ago 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, without long-term current use of insulin ([place])   [place], [place], DO       Future Appointments             In 1 week Wynetta Emery, Barb Merino, DO [employer] [facility]

## 2021-02-17 NOTE — Telephone Encounter (Signed)
Scheduled 5/12 

## 2021-02-25 ENCOUNTER — Ambulatory Visit: Payer: BC Managed Care – PPO | Admitting: Family Medicine

## 2021-04-11 ENCOUNTER — Other Ambulatory Visit: Payer: Self-pay | Admitting: Family Medicine

## 2021-04-11 NOTE — Telephone Encounter (Signed)
Requested Prescriptions  Pending Prescriptions Disp Refills  . cetirizine (ZYRTEC) 10 MG tablet [Pharmacy Med Name: CETIRIZINE 10MG  TABLETS] 90 tablet 0    Sig: TAKE 1 TABLET(10 MG) BY MOUTH DAILY     Ear, Nose, and Throat:  Antihistamines Passed - 04/11/2021  7:53 AM      Passed - Valid encounter within last 12 months    Recent Outpatient Visits          2 months ago Chronic bronchitis, unspecified chronic bronchitis type [facility])   [place], Megan P, DO   2 months ago Acute bronchitis with COPD ([place])   [place], [place] P, DO   4 months ago 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, without long-term current use of insulin ([place])   [place], Megan P, DO   5 months ago Exposure to COVID-19 virus   [employer], Megan P, DO   7 months ago 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, without long-term current use of insulin ([place])   [place], [place], DO

## 2021-05-06 ENCOUNTER — Other Ambulatory Visit: Payer: Self-pay | Admitting: Family Medicine

## 2021-05-17 ENCOUNTER — Other Ambulatory Visit: Payer: Self-pay | Admitting: Family Medicine

## 2021-05-23 ENCOUNTER — Other Ambulatory Visit: Payer: Self-pay | Admitting: Family Medicine

## 2021-05-23 NOTE — Telephone Encounter (Signed)
Requested Prescriptions  Pending Prescriptions Disp Refills  . metFORMIN (GLUCOPHAGE) 500 MG tablet [Pharmacy Med Name: METFORMIN $RemoveBeforeD'500MG'VCiWgLizsStVEO$  TABLETS] 60 tablet 0    Sig: TAKE 1 TABLET(500 MG) BY MOUTH TWICE DAILY WITH A MEAL     Endocrinology:  Diabetes - Biguanides Passed - 05/23/2021  3:23 PM      Passed - Cr in normal range and within 360 days    Creatinine  Date Value Ref Range Status  04/09/2014 0.89 0.60 - 1.30 mg/dL Final   Creatinine, Ser  Date Value Ref Range Status  11/26/2020 0.80 0.57 - 1.00 mg/dL Final         Passed - HBA1C is between 0 and 7.9 and within 180 days    HB A1C (BAYER DCA - WAIVED)  Date Value Ref Range Status  11/26/2020 7.3 (H) <7.0 % Final    Comment:                                          Diabetic Adult            <7.0                                       Healthy Adult        4.3 - 5.7                                                           (DCCT/NGSP) American Diabetes Association's Summary of Glycemic Recommendations for Adults with Diabetes: Hemoglobin A1c <7.0%. More stringent glycemic goals (A1c <6.0%) may further reduce complications at the cost of increased risk of hypoglycemia.          Passed - eGFR in normal range and within 360 days    EGFR (African American)  Date Value Ref Range Status  04/09/2014 >60  Final   GFR calc Af Amer  Date Value Ref Range Status  11/26/2020 104 >59 mL/min/1.73 Final    Comment:    **In accordance with recommendations from the NKF-ASN Task force,**   [facility] is in the process of updating its eGFR calculation to the   2021 CKD-EPI creatinine equation that estimates kidney function   without a race variable.    EGFR (Non-African Amer.)  Date Value Ref Range Status  04/09/2014 >60  Final    Comment:    eGFR values <8mL/min/1.73 m2 may be an indication of chronic kidney disease (CKD). Calculated eGFR is useful in patients with stable renal function. The eGFR calculation will not be reliable in  acutely ill patients when serum creatinine is changing rapidly. It is not useful in  patients on dialysis. The eGFR calculation may not be applicable to patients at the low and high extremes of body sizes, pregnant women, and vegetarians.    GFR calc non Af Amer  Date Value Ref Range Status  11/26/2020 91 >59 mL/min/1.73 Final         Passed - Valid encounter within last 6 months    Recent Outpatient Visits          3 months ago Chronic bronchitis, unspecified chronic  bronchitis type [facility])   [place], Megan P, DO   4 months ago Acute bronchitis with COPD [facility])   [place], [place] P, DO   5 months ago 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, without long-term current use of insulin ([place])   [place], Megan P, DO   6 months ago Exposure to COVID-19 virus   [employer], Megan P, DO   9 months ago 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, without long-term current use of insulin ([place])   [place], [place], DO

## 2021-05-23 NOTE — Telephone Encounter (Signed)
Requested Prescriptions  Pending Prescriptions Disp Refills  . lisinopril (ZESTRIL) 5 MG tablet [Pharmacy Med Name: LISINOPRIL '5MG'$  TABLETS] 90 tablet 0    Sig: TAKE 1 TABLET(5 MG) BY MOUTH DAILY     Cardiovascular:  ACE Inhibitors Passed - 05/23/2021 10:02 AM      Passed - Cr in normal range and within 180 days    Creatinine  Date Value Ref Range Status  04/09/2014 0.89 0.60 - 1.30 mg/dL Final   Creatinine, Ser  Date Value Ref Range Status  11/26/2020 0.80 0.57 - 1.00 mg/dL Final         Passed - K in normal range and within 180 days    Potassium  Date Value Ref Range Status  11/26/2020 4.4 3.5 - 5.2 mmol/L Final  04/09/2014 4.2 3.5 - 5.1 mmol/L Final         Passed - Patient is not pregnant      Passed - Last BP in normal range    BP Readings from Last 1 Encounters:  02/02/21 113/75         Passed - Valid encounter within last 6 months    Recent Outpatient Visits          3 months ago Chronic bronchitis, unspecified chronic bronchitis type ([place])   [place], Megan P, DO   4 months ago Acute bronchitis with COPD ([place])   [place], [place] P, DO   5 months ago 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, without long-term current use of insulin ([place])   [place], Megan P, DO   6 months ago Exposure to COVID-19 virus   [employer], Megan P, DO   9 months ago 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, without long-term current use of insulin [facility])   [place], [place], DO             '

## 2021-05-24 ENCOUNTER — Other Ambulatory Visit: Payer: Self-pay | Admitting: Family Medicine

## 2021-05-30 ENCOUNTER — Other Ambulatory Visit: Payer: Self-pay | Admitting: Family Medicine

## 2021-05-30 NOTE — Telephone Encounter (Signed)
Requested Prescriptions  Pending Prescriptions Disp Refills  . naproxen (NAPROSYN) 500 MG tablet [Pharmacy Med Name: NAPROXEN '500MG'$  TABLETS] 60 tablet 3    Sig: TAKE 1 TABLET(500 MG) BY MOUTH TWICE DAILY WITH A MEAL     Analgesics:  NSAIDS Passed - 05/30/2021 10:28 AM      Passed - Cr in normal range and within 360 days    Creatinine  Date Value Ref Range Status  04/09/2014 0.89 0.60 - 1.30 mg/dL Final   Creatinine, Ser  Date Value Ref Range Status  11/26/2020 0.80 0.57 - 1.00 mg/dL Final         Passed - HGB in normal range and within 360 days    Hemoglobin  Date Value Ref Range Status  11/26/2020 14.9 11.1 - 15.9 g/dL Final         Passed - Patient is not pregnant      Passed - Valid encounter within last 12 months    Recent Outpatient Visits          3 months ago Chronic bronchitis, unspecified chronic bronchitis type ([place])   [place], Megan P, DO   4 months ago Acute bronchitis with COPD ([place])   [place], [place] P, DO   6 months ago 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, without long-term current use of insulin ([place])   [place], Megan P, DO   7 months ago Exposure to COVID-19 virus   [employer], Megan P, DO   9 months ago 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, without long-term current use of insulin ([place])   [place], Megan P, DO             . cetirizine (ZYRTEC) 10 MG tablet [Pharmacy Med Name: CETIRIZINE '10MG'$  TABLETS] 90 tablet 0    Sig: TAKE 1 TABLET(10 MG) BY MOUTH DAILY     Ear, Nose, and Throat:  Antihistamines Passed - 05/30/2021 10:28 AM      Passed - Valid encounter within last 12 months    Recent Outpatient Visits          3 months ago Chronic bronchitis, unspecified chronic bronchitis type ([place])   [place], Megan P, DO   4 months ago Acute  bronchitis with COPD ([place])   [place], Megan P, DO   6 months ago 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, without long-term current use of insulin ([place])   [place], Megan P, DO   7 months ago Exposure to COVID-19 virus   [employer], Megan P, DO   9 months ago 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, without long-term current use of insulin ([place])   [place], [place], DO

## 2021-06-28 ENCOUNTER — Other Ambulatory Visit: Payer: Self-pay | Admitting: Family Medicine

## 2021-06-29 NOTE — Telephone Encounter (Signed)
Requested medications are due for refill today.  yes  Requested medications are on the active medications list.  yes  Last refill. 05/23/2021  Future visit scheduled.   no  Notes to clinic.  Pt already given courtesy refill.

## 2021-07-03 ENCOUNTER — Emergency Department: Payer: BC Managed Care – PPO

## 2021-07-03 ENCOUNTER — Encounter: Payer: Self-pay | Admitting: Internal Medicine

## 2021-07-03 ENCOUNTER — Other Ambulatory Visit: Payer: Self-pay

## 2021-07-03 ENCOUNTER — Emergency Department
Admission: EM | Admit: 2021-07-03 | Discharge: 2021-07-03 | Disposition: A | Discharge status: Home / Self Care | Payer: BC Managed Care – PPO | Attending: Emergency Medicine | Admitting: Emergency Medicine

## 2021-07-03 DIAGNOSIS — E1129 Type 2 diabetes mellitus with other diabetic kidney complication: Secondary | ICD-10-CM | POA: Diagnosis not present

## 2021-07-03 DIAGNOSIS — N189 Chronic kidney disease, unspecified: Secondary | ICD-10-CM | POA: Insufficient documentation

## 2021-07-03 DIAGNOSIS — Z7984 Long term (current) use of oral hypoglycemic drugs: Secondary | ICD-10-CM | POA: Insufficient documentation

## 2021-07-03 DIAGNOSIS — Z79899 Other long term (current) drug therapy: Secondary | ICD-10-CM | POA: Diagnosis not present

## 2021-07-03 DIAGNOSIS — J45909 Unspecified asthma, uncomplicated: Secondary | ICD-10-CM | POA: Diagnosis not present

## 2021-07-03 DIAGNOSIS — I129 Hypertensive chronic kidney disease with stage 1 through stage 4 chronic kidney disease, or unspecified chronic kidney disease: Secondary | ICD-10-CM | POA: Diagnosis not present

## 2021-07-03 DIAGNOSIS — I2510 Atherosclerotic heart disease of native coronary artery without angina pectoris: Secondary | ICD-10-CM | POA: Insufficient documentation

## 2021-07-03 DIAGNOSIS — E1122 Type 2 diabetes mellitus with diabetic chronic kidney disease: Secondary | ICD-10-CM | POA: Diagnosis not present

## 2021-07-03 DIAGNOSIS — F17210 Nicotine dependence, cigarettes, uncomplicated: Secondary | ICD-10-CM | POA: Diagnosis not present

## 2021-07-03 DIAGNOSIS — L03031 Cellulitis of right toe: Secondary | ICD-10-CM | POA: Insufficient documentation

## 2021-07-03 DIAGNOSIS — M7989 Other specified soft tissue disorders: Secondary | ICD-10-CM | POA: Diagnosis present

## 2021-07-03 MED ORDER — SULFAMETHOXAZOLE-TRIMETHOPRIM 800-160 MG PO TABS: 1.0000 | ORAL_TABLET | Freq: Two times a day (BID) | ORAL | 0 refills | Status: AC

## 2021-07-03 MED ORDER — SULFAMETHOXAZOLE-TRIMETHOPRIM 800-160 MG PO TABS
1.0000 | ORAL_TABLET | Freq: Once | ORAL | Status: AC
  Administered 2021-07-03: 1 via ORAL
  Filled 2021-07-03: qty 1
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
## 2021-07-03 NOTE — ED Triage Notes (Signed)
Patient reports right 5th toes is bruised and swollen, unsure of injury.

## 2021-07-03 NOTE — Discharge Instructions (Addendum)
Take the Bactrim antibiotic twice daily for the next 1 week to treat the possibility of cellulitis/skin infection of that toe.  Keep close eye on this toe to make sure it is not spreading redness coming up your foot, pus coming from beneath her nail or other worsening features.  If so, return to the ED.  Finish all 14 pills of the antibiotic, even if your toes looking better.

## 2021-07-13 ENCOUNTER — Other Ambulatory Visit: Payer: BC Managed Care – PPO

## 2021-07-13 DIAGNOSIS — R059 Cough, unspecified: Secondary | ICD-10-CM

## 2021-07-15 LAB — SARS-COV-2, NAA 2 DAY TAT

## 2021-07-15 LAB — NOVEL CORONAVIRUS, NAA: SARS-CoV-2, NAA: NOT DETECTED

## 2021-07-18 ENCOUNTER — Other Ambulatory Visit: Payer: Self-pay | Admitting: Family Medicine

## 2021-08-22 ENCOUNTER — Other Ambulatory Visit: Payer: Self-pay | Admitting: Family Medicine

## 2021-08-22 NOTE — Telephone Encounter (Signed)
Refill due but unable to reach pt to make appt. Called pt and VM is full. Last RF :05/23/21 #60 and is due. Previous refill was a courtesy refill.  Requested Prescriptions  Pending Prescriptions Disp Refills   metFORMIN (GLUCOPHAGE) 500 MG tablet [Pharmacy Med Name: METFORMIN 500MG TABLETS] 180 tablet     Sig: TAKE 1 TABLET(500 MG) BY MOUTH TWICE DAILY WITH A MEAL     Endocrinology:  Diabetes - Biguanides Failed - 08/22/2021 10:06 AM      Failed - HBA1C is between 0 and 7.9 and within 180 days    HB A1C (BAYER DCA - WAIVED)  Date Value Ref Range Status  11/26/2020 7.3 (H) <7.0 % Final    Comment:                                          Diabetic Adult            <7.0                                       Healthy Adult        4.3 - 5.7                                                           (DCCT/NGSP) American Diabetes Association's Summary of Glycemic Recommendations for Adults with Diabetes: Hemoglobin A1c <7.0%. More stringent glycemic goals (A1c <6.0%) may further reduce complications at the cost of increased risk of hypoglycemia.           Failed - Valid encounter within last 6 months    Recent Outpatient Visits           6 months ago Chronic bronchitis, unspecified chronic bronchitis type ([place])   [place], Megan P, DO   7 months ago Acute bronchitis with COPD ([place])   [place], [place] P, DO   8 months ago 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, without long-term current use of insulin ([place])   [place], Megan P, DO   9 months ago Exposure to COVID-19 virus   [employer], Megan P, DO   1 year ago 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, without long-term current use of insulin ([place])   [place], Megan P, DO              Passed - Cr in normal range and within 360 days    Creatinine  Date  Value Ref Range Status  04/09/2014 0.89 0.60 - 1.30 mg/dL Final   Creatinine, Ser  Date Value Ref Range Status  11/26/2020 0.80 0.57 - 1.00 mg/dL Final          Passed - eGFR in normal range and within 360 days    EGFR (African American)  Date Value Ref Range Status  04/09/2014 >60  Final   GFR calc Af Amer  Date Value Ref Range Status  11/26/2020 104 >59 mL/min/1.73 Final    Comment:    **In accordance with recommendations  from the NKF-ASN Task force,**   [facility] is in the process of updating its eGFR calculation to the   2021 CKD-EPI creatinine equation that estimates kidney function   without a race variable.    EGFR (Non-African Amer.)  Date Value Ref Range Status  04/09/2014 >60  Final    Comment:    eGFR values <52m/min/1.73 m2 may be an indication of chronic kidney disease (CKD). Calculated eGFR is useful in patients with stable renal function. The eGFR calculation will not be reliable in acutely ill patients when serum creatinine is changing rapidly. It is not useful in  patients on dialysis. The eGFR calculation may not be applicable to patients at the low and high extremes of body sizes, pregnant women, and vegetarians.    GFR calc non Af Amer  Date Value Ref Range Status  11/26/2020 91 >59 mL/min/1.73 Final

## 2021-08-23 ENCOUNTER — Other Ambulatory Visit: Payer: Self-pay | Admitting: Family Medicine

## 2021-08-23 NOTE — Telephone Encounter (Signed)
Pt has appt 09/02/21

## 2021-08-27 ENCOUNTER — Other Ambulatory Visit: Payer: Self-pay | Admitting: Family Medicine

## 2021-08-27 NOTE — Telephone Encounter (Signed)
Requested Prescriptions  Pending Prescriptions Disp Refills  . montelukast (SINGULAIR) 10 MG tablet [Pharmacy Med Name: MONTELUKAST 10MG  TABLETS] 30 tablet 0    Sig: TAKE 1 TABLET(10 MG) BY MOUTH AT BEDTIME     Pulmonology:  Leukotriene Inhibitors Passed - 08/27/2021 10:17 AM      Passed - Valid encounter within last 12 months    Recent Outpatient Visits          6 months ago Chronic bronchitis, unspecified chronic bronchitis type ([place])   [facility], Megan P, DO   7 months ago Acute bronchitis with COPD ([place])   [place], [place] P, DO   9 months ago 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, without long-term current use of insulin ([place])   [place], Megan P, DO   10 months ago Exposure to COVID-19 virus   [employer], Megan P, DO   1 year ago 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, without long-term current use of insulin ([place])   [place], [place], DO      Future Appointments            In 6 days Wynetta Emery, Barb Merino, DO [employer] [facility], [facility]

## 2021-08-28 NOTE — Telephone Encounter (Signed)
Requested medication (s) are due for refill today: Yes  Requested medication (s) are on the active medication list: Yes  Last refill:  08/27/21  Future visit scheduled: Yes  Notes to clinic:  Unable to refill per protocol, courtesy given, requesting 90 day refill    Requested Prescriptions  Pending Prescriptions Disp Refills   metFORMIN (GLUCOPHAGE) 500 MG tablet [Pharmacy Med Name: METFORMIN $RemoveBeforeD'500MG'[id number]$  TABLETS] 180 tablet     Sig: TAKE 1 TABLET(500 MG) BY MOUTH TWICE DAILY WITH A MEAL     Endocrinology:  Diabetes - Biguanides Failed - 08/27/2021  9:45 PM      Failed - HBA1C is between 0 and 7.9 and within 180 days    HB A1C (BAYER DCA - WAIVED)  Date Value Ref Range Status  11/26/2020 7.3 (H) <7.0 % Final    Comment:                                          Diabetic Adult            <7.0                                       Healthy Adult        4.3 - 5.7                                                           (DCCT/NGSP) American Diabetes Association's Summary of Glycemic Recommendations for Adults with Diabetes: Hemoglobin A1c <7.0%. More stringent glycemic goals (A1c <6.0%) may further reduce complications at the cost of increased risk of hypoglycemia.           Failed - Valid encounter within last 6 months    Recent Outpatient Visits           6 months ago Chronic bronchitis, unspecified chronic bronchitis type ([place])   [place], Megan P, DO   7 months ago Acute bronchitis with COPD ([place])   [place], [place] P, DO   9 months ago 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, without long-term current use of insulin ([place])   [place], Megan P, DO   10 months ago Exposure to COVID-19 virus   [employer], Megan P, DO   1 year ago 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, without long-term current use of insulin ([place])    [place], [place], DO       Future Appointments             In 5 days [name], Megan P, DO [place]            Passed - Cr in normal range and within 360 days    Creatinine  Date Value Ref Range Status  04/09/2014 0.89 0.60 - 1.30 mg/dL Final   Creatinine, Ser  Date Value Ref Range Status  11/26/2020 0.80 0.57 - 1.00 mg/dL Final          Passed - eGFR in normal range and  within 360 days    EGFR (African American)  Date Value Ref Range Status  04/09/2014 >60  Final   GFR calc Af Amer  Date Value Ref Range Status  11/26/2020 104 >59 mL/min/1.73 Final    Comment:    **In accordance with recommendations from the NKF-ASN Task force,**   [facility] is in the process of updating its eGFR calculation to the   2021 CKD-EPI creatinine equation that estimates kidney function   without a race variable.    EGFR (Non-African Amer.)  Date Value Ref Range Status  04/09/2014 >60  Final    Comment:    eGFR values <28mL/min/1.73 m2 may be an indication of chronic kidney disease (CKD). Calculated eGFR is useful in patients with stable renal function. The eGFR calculation will not be reliable in acutely ill patients when serum creatinine is changing rapidly. It is not useful in  patients on dialysis. The eGFR calculation may not be applicable to patients at the low and high extremes of body sizes, pregnant women, and vegetarians.    GFR calc non Af Amer  Date Value Ref Range Status  11/26/2020 91 >59 mL/min/1.73 Final           montelukast (SINGULAIR) 10 MG tablet [Pharmacy Med Name: MONTELUKAST $RemoveBeforeDEI'10MG'[id number]$  TABLETS] 90 tablet     Sig: TAKE 1 TABLET(10 MG) BY MOUTH AT BEDTIME     Pulmonology:  Leukotriene Inhibitors Passed - 08/27/2021  9:45 PM      Passed - Valid encounter within last 12 months    Recent Outpatient Visits           6 months ago Chronic bronchitis, unspecified chronic bronchitis type ([place])   [facility], Megan P, DO   7 months ago Acute bronchitis with COPD ([place])   [place], Megan P, DO   9 months ago 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, without long-term current use of insulin ([place])   [place], Megan P, DO   10 months ago Exposure to COVID-19 virus   [employer], Megan P, DO   1 year ago 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, without long-term current use of insulin ([place])   [place], [place], DO       Future Appointments             In 5 days Wynetta Emery, Barb Merino, DO [facility]

## 2021-09-02 ENCOUNTER — Encounter: Payer: Self-pay | Admitting: Family Medicine

## 2021-09-02 ENCOUNTER — Other Ambulatory Visit: Payer: Self-pay

## 2021-09-02 ENCOUNTER — Ambulatory Visit: Payer: BC Managed Care – PPO | Admitting: Family Medicine

## 2021-09-02 VITALS — BP 127/82 | HR 98 | Temp 98.1°F | Wt 250.4 lb

## 2021-09-02 DIAGNOSIS — F3170 Bipolar disorder, currently in remission, most recent episode unspecified: Secondary | ICD-10-CM

## 2021-09-02 DIAGNOSIS — K259 Gastric ulcer, unspecified as acute or chronic, without hemorrhage or perforation: Secondary | ICD-10-CM

## 2021-09-02 DIAGNOSIS — E1122 Type 2 diabetes mellitus with diabetic chronic kidney disease: Secondary | ICD-10-CM | POA: Diagnosis not present

## 2021-09-02 DIAGNOSIS — T39395A Adverse effect of other nonsteroidal anti-inflammatory drugs [NSAID], initial encounter: Secondary | ICD-10-CM

## 2021-09-02 DIAGNOSIS — N181 Chronic kidney disease, stage 1: Secondary | ICD-10-CM | POA: Diagnosis not present

## 2021-09-02 DIAGNOSIS — D500 Iron deficiency anemia secondary to blood loss (chronic): Secondary | ICD-10-CM

## 2021-09-02 DIAGNOSIS — I129 Hypertensive chronic kidney disease with stage 1 through stage 4 chronic kidney disease, or unspecified chronic kidney disease: Secondary | ICD-10-CM | POA: Diagnosis not present

## 2021-09-02 LAB — MICROALBUMIN, URINE WAIVED
Creatinine, Urine Waived: 50 mg/dL (ref 10–300)
Microalb, Ur Waived: 30 mg/L — ABNORMAL HIGH (ref 0–19)

## 2021-09-02 LAB — BAYER DCA HB A1C WAIVED: HB A1C (BAYER DCA - WAIVED): 6.6 % — ABNORMAL HIGH (ref 4.8–5.6)

## 2021-09-02 MED ORDER — MONTELUKAST SODIUM 10 MG PO TABS: ORAL_TABLET | ORAL | 1 refills | Status: DC

## 2021-09-02 MED ORDER — METFORMIN HCL 500 MG PO TABS: ORAL_TABLET | ORAL | 1 refills | Status: DC

## 2021-09-02 MED ORDER — ALBUTEROL SULFATE HFA 108 (90 BASE) MCG/ACT IN AERS: 2.0000 | INHALATION_SPRAY | Freq: Four times a day (QID) | RESPIRATORY_TRACT | 6 refills | Status: DC | PRN

## 2021-09-02 MED ORDER — NAPROXEN 500 MG PO TABS: ORAL_TABLET | ORAL | 1 refills | Status: DC

## 2021-09-02 MED ORDER — CYCLOBENZAPRINE HCL 10 MG PO TABS: 10.0000 mg | ORAL_TABLET | Freq: Three times a day (TID) | ORAL | 6 refills | Status: DC | PRN

## 2021-09-02 MED ORDER — CETIRIZINE HCL 10 MG PO TABS: ORAL_TABLET | ORAL | 3 refills | Status: DC

## 2021-09-02 MED ORDER — LISINOPRIL 5 MG PO TABS: 5.0000 mg | ORAL_TABLET | Freq: Every day | ORAL | 1 refills | Status: DC

## 2021-09-02 MED ORDER — PANTOPRAZOLE SODIUM 40 MG PO TBEC: 40.0000 mg | DELAYED_RELEASE_TABLET | Freq: Two times a day (BID) | ORAL | 1 refills | Status: DC

## 2021-09-02 MED ORDER — TRAZODONE HCL 150 MG PO TABS: 150.0000 mg | ORAL_TABLET | Freq: Every day | ORAL | 1 refills | Status: DC

## 2021-09-02 NOTE — Assessment & Plan Note (Signed)
Doing great with a1c of 6.6. Continue current regimen. Continue to monitor. Call with any concerns.

## 2021-09-02 NOTE — Assessment & Plan Note (Signed)
Rechecking labs checked today. Await results. Treat as needed.

## 2021-09-02 NOTE — Assessment & Plan Note (Signed)
Under good control on current regimen. Continue current regimen. Continue to monitor. Call with any concerns. Refills given. Labs drawn today.   

## 2021-09-02 NOTE — Assessment & Plan Note (Signed)
Not doing great. Doing well with her psychiatrist. Continue to follow with them. Call with any concerns.

## 2021-09-02 NOTE — Progress Notes (Signed)
BP 127/82   Pulse 98   Temp 98.1 F (36.7 C)   Wt 250 lb 6.4 oz (113.6 kg)   LMP 07/13/2020 (Approximate)   SpO2 98%   BMI 33.96 kg/m    Subjective:    Patient ID: [name], female    DOB: 1976/04/13, 45 y.o.   MRN: [id number]  HPI: [name] is a 45 y.o. female  Chief Complaint  Patient presents with   Diabetes   DIABETES Hypoglycemic episodes:no Polydipsia/polyuria: no Visual disturbance: no Chest pain: no Paresthesias: no Glucose Monitoring: no  Accucheck frequency: Not Checking Taking Insulin?: no Blood Pressure Monitoring: not checking Retinal Examination: Up to Date Foot Exam: Up to Date Diabetic Education: Completed Pneumovax: Up to Date Influenza: Up to Date Aspirin: no  HYPERTENSION / HYPERLIPIDEMIA Satisfied with current treatment? yes Duration of hypertension: chronic BP monitoring frequency: not checking BP medication side effects: no Past BP meds: lisinopril Duration of hyperlipidemia: chronic Cholesterol medication side effects: not on anything Cholesterol supplements: none Past cholesterol medications: none Medication compliance: excellent compliance Aspirin: no Recent stressors: yes Recurrent headaches: no Visual changes: no Palpitations: no Dyspnea: no Chest pain: no Lower extremity edema: no Dizzy/lightheaded: no  Relevant past medical, surgical, family and social history reviewed and updated as indicated. Interim medical history since our last visit reviewed. Allergies and medications reviewed and updated.  Review of Systems  Constitutional: Negative.   Respiratory: Negative.    Cardiovascular: Negative.   Gastrointestinal: Negative.   Genitourinary: Negative.   Musculoskeletal: Negative.   Neurological: Negative.   Psychiatric/Behavioral:  Positive for dysphoric mood. Negative for agitation, behavioral problems, confusion, decreased concentration, hallucinations, self-injury, sleep  disturbance and suicidal ideas. The patient is nervous/anxious. The patient is not hyperactive.    Per HPI unless specifically indicated above     Objective:    BP 127/82   Pulse 98   Temp 98.1 F (36.7 C)   Wt 250 lb 6.4 oz (113.6 kg)   LMP 07/13/2020 (Approximate)   SpO2 98%   BMI 33.96 kg/m   Wt Readings from Last 3 Encounters:  09/02/21 250 lb 6.4 oz (113.6 kg)  07/03/21 254 lb (115.2 kg)  02/02/21 258 lb 12.8 oz (117.4 kg)    Physical Exam Vitals and nursing note reviewed.  Constitutional:      General: She is not in acute distress.    Appearance: Normal appearance. She is not ill-appearing, toxic-appearing or diaphoretic.  HENT:     Head: Normocephalic and atraumatic.     Right Ear: External ear normal.     Left Ear: External ear normal.     Nose: Nose normal.     Mouth/Throat:     Mouth: Mucous membranes are moist.     Pharynx: Oropharynx is clear.  Eyes:     General: No scleral icterus.       Right eye: No discharge.        Left eye: No discharge.     Extraocular Movements: Extraocular movements intact.     Conjunctiva/sclera: Conjunctivae normal.     Pupils: Pupils are equal, round, and reactive to light.  Cardiovascular:     Rate and Rhythm: Normal rate and regular rhythm.     Pulses: Normal pulses.     Heart sounds: Normal heart sounds. No murmur heard.   No friction rub. No gallop.  Pulmonary:     Effort: Pulmonary effort is normal. No respiratory distress.     Breath sounds: Normal breath  sounds. No stridor. No wheezing, rhonchi or rales.  Chest:     Chest wall: No tenderness.  Musculoskeletal:        General: Normal range of motion.     Cervical back: Normal range of motion and neck supple.  Skin:    General: Skin is warm and dry.     Capillary Refill: Capillary refill takes less than 2 seconds.     Coloration: Skin is not jaundiced or pale.     Findings: No bruising, erythema, lesion or rash.  Neurological:     General: No focal deficit  present.     Mental Status: She is alert and oriented to person, place, and time. Mental status is at baseline.  Psychiatric:        Mood and Affect: Mood normal.        Behavior: Behavior normal.        Thought Content: Thought content normal.        Judgment: Judgment normal.    Results for orders placed or performed in visit on 09/02/21  Bayer DCA Hb A1c Waived  Result Value Ref Range   HB A1C (BAYER DCA - WAIVED) 6.6 (H) 4.8 - 5.6 %  Microalbumin, Urine Waived  Result Value Ref Range   Microalb, Ur Waived 30 (H) 0 - 19 mg/L   Creatinine, Urine Waived 50 10 - 300 mg/dL   Microalb/Creat Ratio 30-300 (H) <30 mg/g      Assessment & Plan:   Problem List Items Addressed This Visit       Digestive   NSAID-induced gastric ulcer    Under good control on current regimen. Continue current regimen. Continue to monitor. Call with any concerns. Refills given. Labs drawn today.        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 Continue current regimen. Continue to monitor. Call with any concerns.       Relevant Medications   lisinopril (ZESTRIL) 5 MG tablet   metFORMIN (GLUCOPHAGE) 500 MG tablet   Other Relevant Orders   Comprehensive metabolic panel   CBC with Differential/Platelet   Bayer DCA Hb A1c Waived (Completed)   Lipid Panel w/o Chol/HDL Ratio     Genitourinary   Chronic kidney disease    Rechecking labs checked today. Await results. Treat as needed.       Relevant Orders   Comprehensive metabolic panel   CBC with Differential/Platelet   Microalbumin, Urine Waived (Completed)   Benign hypertensive renal disease    Under good control on current regimen. Continue current regimen. Continue to monitor. Call with any concerns. Refills given. Labs drawn today.       Relevant Orders   Comprehensive  metabolic panel   CBC with Differential/Platelet   Microalbumin, Urine Waived (Completed)     Other   Bipolar disorder ([place])    Not doing great. Doing well with her psychiatrist. Continue to follow with them. Call with any concerns.       Iron deficiency anemia due to chronic blood loss    Rechecking labs checked today. Await results. Treat as needed.       Relevant Orders   Comprehensive metabolic panel   CBC with Differential/Platelet     Follow up plan: Return in about 6 months (around 03/02/2022), or physcial.

## 2021-09-03 LAB — COMPREHENSIVE METABOLIC PANEL
ALT: 33 IU/L — ABNORMAL HIGH (ref 0–32)
AST: 24 IU/L (ref 0–40)
Albumin/Globulin Ratio: 2.3 — ABNORMAL HIGH (ref 1.2–2.2)
Albumin: 5.4 g/dL — ABNORMAL HIGH (ref 3.8–4.8)
Alkaline Phosphatase: 84 IU/L (ref 44–121)
BUN/Creatinine Ratio: 22 (ref 9–23)
BUN: 18 mg/dL (ref 6–24)
Bilirubin Total: 0.2 mg/dL (ref 0.0–1.2)
CO2: 17 mmol/L — ABNORMAL LOW (ref 20–29)
Calcium: 10.8 mg/dL — ABNORMAL HIGH (ref 8.7–10.2)
Chloride: 103 mmol/L (ref 96–106)
Creatinine, Ser: 0.81 mg/dL (ref 0.57–1.00)
Globulin, Total: 2.3 g/dL (ref 1.5–4.5)
Glucose: 150 mg/dL — ABNORMAL HIGH (ref 70–99)
Potassium: 4.6 mmol/L (ref 3.5–5.2)
Sodium: 141 mmol/L (ref 134–144)
Total Protein: 7.7 g/dL (ref 6.0–8.5)
eGFR: 92 mL/min/{1.73_m2} (ref >59)

## 2021-09-03 LAB — CBC WITH DIFFERENTIAL/PLATELET
Basophils Absolute: 0.1 10*3/uL (ref 0.0–0.2)
Basos: 1 %
EOS (ABSOLUTE): 0.2 10*3/uL (ref 0.0–0.4)
Eos: 2 %
Hematocrit: 47.9 % — ABNORMAL HIGH (ref 34.0–46.6)
Hemoglobin: 16.2 g/dL — ABNORMAL HIGH (ref 11.1–15.9)
Immature Grans (Abs): 0 10*3/uL (ref 0.0–0.1)
Immature Granulocytes: 0 %
Lymphocytes Absolute: 4.3 10*3/uL — ABNORMAL HIGH (ref 0.7–3.1)
Lymphs: 41 %
MCH: 28.1 pg (ref 26.6–33.0)
MCHC: 33.8 g/dL (ref 31.5–35.7)
MCV: 83 fL (ref 79–97)
Monocytes Absolute: 0.7 10*3/uL (ref 0.1–0.9)
Monocytes: 6 %
Neutrophils Absolute: 5.2 10*3/uL (ref 1.4–7.0)
Neutrophils: 50 %
Platelets: 217 10*3/uL (ref 150–450)
RBC: 5.76 x10E6/uL — ABNORMAL HIGH (ref 3.77–5.28)
RDW: 13.1 % (ref 11.7–15.4)
WBC: 10.4 10*3/uL (ref 3.4–10.8)

## 2021-09-03 LAB — LIPID PANEL W/O CHOL/HDL RATIO
Cholesterol, Total: 157 mg/dL (ref 100–199)
HDL: 30 mg/dL — ABNORMAL LOW (ref >39)
LDL Chol Calc (NIH): 74 mg/dL (ref 0–99)
Triglycerides: 331 mg/dL — ABNORMAL HIGH (ref 0–149)
VLDL Cholesterol Cal: 53 mg/dL — ABNORMAL HIGH (ref 5–40)

## 2021-09-24 ENCOUNTER — Ambulatory Visit: Payer: BC Managed Care – PPO | Admitting: Family Medicine

## 2021-09-24 ENCOUNTER — Encounter: Payer: Self-pay | Admitting: Family Medicine

## 2021-09-24 ENCOUNTER — Other Ambulatory Visit: Payer: Self-pay

## 2021-09-24 VITALS — BP 138/81 | HR 99 | Temp 98.3°F | Wt 253.2 lb

## 2021-09-24 DIAGNOSIS — H01002 Unspecified blepharitis right lower eyelid: Secondary | ICD-10-CM

## 2021-09-24 DIAGNOSIS — F3175 Bipolar disorder, in partial remission, most recent episode depressed: Secondary | ICD-10-CM | POA: Diagnosis not present

## 2021-09-24 DIAGNOSIS — N898 Other specified noninflammatory disorders of vagina: Secondary | ICD-10-CM | POA: Diagnosis not present

## 2021-09-24 LAB — URINALYSIS, ROUTINE W REFLEX MICROSCOPIC
Bilirubin, UA: NEGATIVE
Ketones, UA: NEGATIVE
Leukocytes,UA: NEGATIVE
Nitrite, UA: NEGATIVE
Protein,UA: NEGATIVE
RBC, UA: NEGATIVE
Specific Gravity, UA: 1.015 (ref 1.005–1.030)
Urobilinogen, Ur: 0.2 mg/dL (ref 0.2–1.0)
pH, UA: 7 (ref 5.0–7.5)

## 2021-09-24 LAB — WET PREP FOR TRICH, YEAST, CLUE
Clue Cell Exam: NEGATIVE
Trichomonas Exam: NEGATIVE
Yeast Exam: POSITIVE — AB

## 2021-09-24 MED ORDER — FLUCONAZOLE 150 MG PO TABS: 150.0000 mg | ORAL_TABLET | Freq: Once | ORAL | 1 refills | Status: AC

## 2021-09-24 MED ORDER — SULFAMETHOXAZOLE-TRIMETHOPRIM 800-160 MG PO TABS: 1.0000 | ORAL_TABLET | Freq: Two times a day (BID) | ORAL | 0 refills | Status: DC

## 2021-09-24 MED ORDER — METOPROLOL SUCCINATE ER 25 MG PO TB24: 12.5000 mg | ORAL_TABLET | Freq: Every day | ORAL | 3 refills | Status: DC

## 2021-09-24 NOTE — Progress Notes (Signed)
BP 138/81   Pulse 99   Temp 98.3 F (36.8 C)   Wt 253 lb 3.2 oz (114.9 kg)   LMP 07/13/2020 (Approximate)   SpO2 97%   BMI 34.34 kg/m    Subjective:    Patient ID: [name], female    DOB: 1976/05/22, 45 y.o.   MRN: [id number]  HPI: [name] is a 45 y.o. female  Chief Complaint  Patient presents with   Eye Drainage    Patient states her right eye has been red all week, it started having discharge this morning. Patient also noticed swelling on her face.    Urinary Tract Infection    Patient states about a week ago she started with burning with urination and vaginal itching. Has tried otc creams but seems to be getting worse.    EYE IRRITATION Duration:  days Involved eye:  right Onset: sudden Severity: moderate  Quality: itchy and red Foreign body sensation:no Visual impairment: no Eye redness: yes Discharge: yes Crusting or matting of eyelids: no Swelling: no Photophobia: no Itching: no Tearing: yes Headache: no Floaters: no URI symptoms: no Contact lens use: no Close contacts with similar problems: no Eye trauma: no Status: worse  VAGINAL DISCHARGE Duration: days Discharge description: white  Pruritus: yes Dysuria: yes Malodorous: yes Urinary frequency: yes Fevers: no Abdominal pain: yes  Sexual activity: monogamous History of sexually transmitted diseases: no Recent antibiotic use: no Context: worse  Treatments attempted: none  Relevant past medical, surgical, family and social history reviewed and updated as indicated. Interim medical history since our last visit reviewed. Allergies and medications reviewed and updated.  Review of Systems  Constitutional: Negative.   HENT:  Positive for facial swelling. Negative for congestion, dental problem, drooling, ear discharge, ear pain, hearing loss, mouth sores, nosebleeds, postnasal drip, rhinorrhea, sinus pressure, sinus pain, sneezing, sore throat, tinnitus,  trouble swallowing and voice change.   Eyes:  Positive for redness. Negative for photophobia, pain, discharge, itching and visual disturbance.  Respiratory: Negative.    Cardiovascular: Negative.   Gastrointestinal: Negative.   Musculoskeletal: Negative.   Skin: Negative.   Psychiatric/Behavioral:  Positive for agitation and dysphoric mood. Negative for behavioral problems, confusion, decreased concentration, hallucinations, self-injury, sleep disturbance and suicidal ideas. The patient is nervous/anxious. The patient is not hyperactive.    Per HPI unless specifically indicated above     Objective:    BP 138/81   Pulse 99   Temp 98.3 F (36.8 C)   Wt 253 lb 3.2 oz (114.9 kg)   LMP 07/13/2020 (Approximate)   SpO2 97%   BMI 34.34 kg/m   Wt Readings from Last 3 Encounters:  09/24/21 253 lb 3.2 oz (114.9 kg)  09/02/21 250 lb 6.4 oz (113.6 kg)  07/03/21 254 lb (115.2 kg)    Physical Exam Vitals and nursing note reviewed.  Constitutional:      General: She is not in acute distress.    Appearance: Normal appearance. She is not ill-appearing, toxic-appearing or diaphoretic.  HENT:     Head: Normocephalic and atraumatic.     Right Ear: External ear normal.     Left Ear: External ear normal.     Nose: Nose normal.     Mouth/Throat:     Mouth: Mucous membranes are moist.     Pharynx: Oropharynx is clear.  Eyes:     General: No scleral icterus.       Right eye: No discharge.  Left eye: No discharge.     Extraocular Movements: Extraocular movements intact.     Conjunctiva/sclera: Conjunctivae normal.     Pupils: Pupils are equal, round, and reactive to light.     Comments: Swelling of R lower lid and redness  Cardiovascular:     Rate and Rhythm: Normal rate and regular rhythm.     Pulses: Normal pulses.     Heart sounds: Normal heart sounds. No murmur heard.   No friction rub. No gallop.  Pulmonary:     Effort: Pulmonary effort is normal. No respiratory distress.      Breath sounds: Normal breath sounds. No stridor. No wheezing, rhonchi or rales.  Chest:     Chest wall: No tenderness.  Musculoskeletal:        General: Normal range of motion.     Cervical back: Normal range of motion and neck supple.  Skin:    General: Skin is warm and dry.     Capillary Refill: Capillary refill takes less than 2 seconds.     Coloration: Skin is not jaundiced or pale.     Findings: No bruising, erythema, lesion or rash.  Neurological:     General: No focal deficit present.     Mental Status: She is alert and oriented to person, place, and time. Mental status is at baseline.  Psychiatric:        Mood and Affect: Mood normal.        Behavior: Behavior normal.        Thought Content: Thought content normal.        Judgment: Judgment normal.    Results for orders placed or performed in visit on 09/24/21  WET PREP FOR [place], YEAST, CLUE   Specimen: Sterile Swab   Sterile Swab  Result Value Ref Range   Trichomonas Exam Negative Negative   Yeast Exam Positive (A) Negative   Clue Cell Exam Negative Negative  Urinalysis, Routine w reflex microscopic  Result Value Ref Range   Specific Gravity, UA 1.015 1.005 - 1.030   pH, UA 7.0 5.0 - 7.5   Color, UA Yellow Yellow   Appearance Ur Clear Clear   Leukocytes,UA Negative Negative   Protein,UA Negative Negative/Trace   Glucose, UA 3+ (A) Negative   Ketones, UA Negative Negative   RBC, UA Negative Negative   Bilirubin, UA Negative Negative   Urobilinogen, Ur 0.2 0.2 - 1.0 mg/dL   Nitrite, UA Negative Negative      Assessment & Plan:   Problem List Items Addressed This Visit       Other   Bipolar disorder ([place])    Not doing well. Has been working closely with her psychiatrist. Offered time off work to get her under better control- she declines at this time. Empathetic listening today. Will start metoprolol to try to help control physical symptoms of anxiety. Recheck 1 month. Call with any concerns. Continue to  monitor closely.       Other Visit Diagnoses     Blepharitis of right lower eyelid, unspecified type    -  Primary   Will treat with bactrim. Call if not getting better or getting worse.    Vaginal discharge       + yeast infection. Will treat with dliflucan. Call with any concerns.    Relevant Orders   WET PREP FOR [place], YEAST, CLUE (Completed)   Urinalysis, Routine w reflex microscopic        Follow up plan: Return in about  4 weeks (around 10/22/2021).

## 2021-09-26 NOTE — Assessment & Plan Note (Signed)
Not doing well. Has been working closely with her psychiatrist. Offered time off work to get her under better control- she declines at this time. Empathetic listening today. Will start metoprolol to try to help control physical symptoms of anxiety. Recheck 1 month. Call with any concerns. Continue to monitor closely.

## 2021-10-20 ENCOUNTER — Encounter: Payer: Self-pay | Admitting: Family Medicine

## 2021-10-22 ENCOUNTER — Encounter: Payer: Self-pay | Admitting: Family Medicine

## 2021-10-22 ENCOUNTER — Ambulatory Visit: Payer: BC Managed Care – PPO | Admitting: Family Medicine

## 2021-10-22 ENCOUNTER — Other Ambulatory Visit: Payer: Self-pay

## 2021-10-22 VITALS — BP 127/84 | HR 91 | Temp 97.6°F | Wt 255.4 lb

## 2021-10-22 DIAGNOSIS — N898 Other specified noninflammatory disorders of vagina: Secondary | ICD-10-CM | POA: Diagnosis not present

## 2021-10-22 DIAGNOSIS — F3175 Bipolar disorder, in partial remission, most recent episode depressed: Secondary | ICD-10-CM | POA: Diagnosis not present

## 2021-10-22 DIAGNOSIS — N760 Acute vaginitis: Secondary | ICD-10-CM

## 2021-10-22 DIAGNOSIS — B9689 Other specified bacterial agents as the cause of diseases classified elsewhere: Secondary | ICD-10-CM

## 2021-10-22 DIAGNOSIS — R300 Dysuria: Secondary | ICD-10-CM

## 2021-10-22 LAB — URINALYSIS, ROUTINE W REFLEX MICROSCOPIC
Bilirubin, UA: NEGATIVE
Glucose, UA: NEGATIVE
Ketones, UA: NEGATIVE
Leukocytes,UA: NEGATIVE
Nitrite, UA: NEGATIVE
Protein,UA: NEGATIVE
RBC, UA: NEGATIVE
Specific Gravity, UA: 1.025 (ref 1.005–1.030)
Urobilinogen, Ur: 0.2 mg/dL (ref 0.2–1.0)
pH, UA: 6 (ref 5.0–7.5)

## 2021-10-22 LAB — WET PREP FOR TRICH, YEAST, CLUE
Clue Cell Exam: POSITIVE — AB
Trichomonas Exam: NEGATIVE
Yeast Exam: NEGATIVE

## 2021-10-22 MED ORDER — METRONIDAZOLE 500 MG PO TABS: 500.0000 mg | ORAL_TABLET | Freq: Two times a day (BID) | ORAL | 0 refills | Status: DC

## 2021-10-22 NOTE — Progress Notes (Signed)
BP 127/84    Pulse 91    Temp 97.6 F (36.4 C)    Wt 255 lb 6.4 oz (115.8 kg)    LMP 07/13/2020 (Approximate)    SpO2 98%    BMI 34.64 kg/m    Subjective:    Patient ID: [name], female    DOB: December 29, 1975, 46 y.o.   MRN: [id number]  HPI: [name] is a 46 y.o. female  Chief Complaint  Patient presents with   Mood     Patient here to follow up Bipolar Disorder    Dysuria    Patient states she is having burning with urination and itching   Mood is doing better. Feeling more like a person. Doing well on her medicine. Continuing to follow with psychiatry.  URINARY SYMPTOMS Duration: Couple of days Dysuria: no Urinary frequency: yes Urgency: yes Small volume voids: yes Symptom severity: mild Urinary incontinence: yes Foul odor: yes Hematuria: no Abdominal pain: yes Back pain: no Suprapubic pain/pressure: no Flank pain: no Fever:  no Vomiting: no Relief with cranberry juice: no Relief with pyridium: no Status: worse Previous urinary tract infection: yes Recurrent urinary tract infection: no Sexual activity: monogomous History of sexually transmitted disease: no Vaginal discharge: yes Treatments attempted: increasing fluids   Relevant past medical, surgical, family and social history reviewed and updated as indicated. Interim medical history since our last visit reviewed. Allergies and medications reviewed and updated.  Review of Systems  Constitutional: Negative.   Respiratory: Negative.    Cardiovascular: Negative.   Gastrointestinal: Negative.   Genitourinary:  Positive for vaginal discharge. Negative for decreased urine volume, difficulty urinating, dyspareunia, dysuria, enuresis, flank pain, frequency, genital sores, hematuria, menstrual problem, pelvic pain, urgency, vaginal bleeding and vaginal pain.  Musculoskeletal: Negative.   Neurological: Negative.   Psychiatric/Behavioral: Negative.     Per HPI unless specifically  indicated above     Objective:    BP 127/84    Pulse 91    Temp 97.6 F (36.4 C)    Wt 255 lb 6.4 oz (115.8 kg)    LMP 07/13/2020 (Approximate)    SpO2 98%    BMI 34.64 kg/m   Wt Readings from Last 3 Encounters:  10/22/21 255 lb 6.4 oz (115.8 kg)  09/24/21 253 lb 3.2 oz (114.9 kg)  09/02/21 250 lb 6.4 oz (113.6 kg)    Physical Exam Vitals and nursing note reviewed.  Constitutional:      General: She is not in acute distress.    Appearance: Normal appearance. She is not ill-appearing, toxic-appearing or diaphoretic.  HENT:     Head: Normocephalic and atraumatic.     Right Ear: External ear normal.     Left Ear: External ear normal.     Nose: Nose normal.     Mouth/Throat:     Mouth: Mucous membranes are moist.     Pharynx: Oropharynx is clear.  Eyes:     General: No scleral icterus.       Right eye: No discharge.        Left eye: No discharge.     Extraocular Movements: Extraocular movements intact.     Conjunctiva/sclera: Conjunctivae normal.     Pupils: Pupils are equal, round, and reactive to light.  Cardiovascular:     Rate and Rhythm: Normal rate and regular rhythm.     Pulses: Normal pulses.     Heart sounds: Normal heart sounds. No murmur heard.   No friction rub. No gallop.  Pulmonary:     Effort: Pulmonary effort is normal. No respiratory distress.     Breath sounds: Normal breath sounds. No stridor. No wheezing, rhonchi or rales.  Chest:     Chest wall: No tenderness.  Musculoskeletal:        General: Normal range of motion.     Cervical back: Normal range of motion and neck supple.  Skin:    General: Skin is warm and dry.     Capillary Refill: Capillary refill takes less than 2 seconds.     Coloration: Skin is not jaundiced or pale.     Findings: No bruising, erythema, lesion or rash.  Neurological:     General: No focal deficit present.     Mental Status: She is alert and oriented to person, place, and time. Mental status is at baseline.  Psychiatric:         Mood and Affect: Mood normal.        Behavior: Behavior normal.        Thought Content: Thought content normal.        Judgment: Judgment normal.    Results for orders placed or performed in visit on 09/24/21  WET PREP FOR [place], YEAST, CLUE   Specimen: Sterile Swab   Sterile Swab  Result Value Ref Range   Trichomonas Exam Negative Negative   Yeast Exam Positive (A) Negative   Clue Cell Exam Negative Negative  Urinalysis, Routine w reflex microscopic  Result Value Ref Range   Specific Gravity, UA 1.015 1.005 - 1.030   pH, UA 7.0 5.0 - 7.5   Color, UA Yellow Yellow   Appearance Ur Clear Clear   Leukocytes,UA Negative Negative   Protein,UA Negative Negative/Trace   Glucose, UA 3+ (A) Negative   Ketones, UA Negative Negative   RBC, UA Negative Negative   Bilirubin, UA Negative Negative   Urobilinogen, Ur 0.2 0.2 - 1.0 mg/dL   Nitrite, UA Negative Negative      Assessment & Plan:   Problem List Items Addressed This Visit       Other   Bipolar disorder ([place])    Doing well with meds. Continue to follow with psychiatry. Call with any concerns.       Other Visit Diagnoses     BV (bacterial vaginosis)    -  Primary   Will treat with flagyl. Call if not getting better or getting worse.    Relevant Medications   metroNIDAZOLE (FLAGYL) 500 MG tablet   Vaginal discharge       +BV   Dysuria       +BV   Relevant Orders   Urinalysis, Routine w reflex microscopic   WET PREP FOR [place]ICH, YEAST, CLUE        Follow up plan: Return May for physical.

## 2021-10-22 NOTE — Assessment & Plan Note (Signed)
Doing well with meds. Continue to follow with psychiatry. Call with any concerns.

## 2021-10-31 ENCOUNTER — Other Ambulatory Visit: Payer: Self-pay

## 2021-10-31 ENCOUNTER — Emergency Department: Payer: BC Managed Care – PPO

## 2021-10-31 DIAGNOSIS — J45909 Unspecified asthma, uncomplicated: Secondary | ICD-10-CM | POA: Insufficient documentation

## 2021-10-31 DIAGNOSIS — I129 Hypertensive chronic kidney disease with stage 1 through stage 4 chronic kidney disease, or unspecified chronic kidney disease: Secondary | ICD-10-CM | POA: Diagnosis not present

## 2021-10-31 DIAGNOSIS — E1122 Type 2 diabetes mellitus with diabetic chronic kidney disease: Secondary | ICD-10-CM | POA: Insufficient documentation

## 2021-10-31 DIAGNOSIS — R1032 Left lower quadrant pain: Secondary | ICD-10-CM | POA: Diagnosis present

## 2021-10-31 DIAGNOSIS — Z79899 Other long term (current) drug therapy: Secondary | ICD-10-CM | POA: Diagnosis not present

## 2021-10-31 DIAGNOSIS — N189 Chronic kidney disease, unspecified: Secondary | ICD-10-CM | POA: Insufficient documentation

## 2021-10-31 DIAGNOSIS — Z7984 Long term (current) use of oral hypoglycemic drugs: Secondary | ICD-10-CM | POA: Insufficient documentation

## 2021-10-31 MED ORDER — FENTANYL CITRATE PF 50 MCG/ML IJ SOSY
50.0000 ug | PREFILLED_SYRINGE | Freq: Once | INTRAMUSCULAR | Status: AC
  Administered 2021-10-31: 50 ug via INTRAVENOUS
  Filled 2021-10-31: qty 1

## 2021-10-31 NOTE — ED Triage Notes (Signed)
Pt arrives with c/o left side flank pain that radiates into the ABD that started about an hour ago. Pt endorses nausea and vomiting. Pt denies fevers.

## 2021-11-01 ENCOUNTER — Emergency Department
Admission: EM | Admit: 2021-11-01 | Discharge: 2021-11-01 | Disposition: A | Discharge status: Home / Self Care | Payer: BC Managed Care – PPO | Attending: Emergency Medicine | Admitting: Emergency Medicine

## 2021-11-01 DIAGNOSIS — R1032 Left lower quadrant pain: Secondary | ICD-10-CM

## 2021-11-01 LAB — COMPREHENSIVE METABOLIC PANEL
ALT: 37 U/L (ref 0–44)
AST: 31 U/L (ref 15–41)
Albumin: 4.3 g/dL (ref 3.5–5.0)
Alkaline Phosphatase: 67 U/L (ref 38–126)
Anion gap: 9 (ref 5–15)
BUN: 15 mg/dL (ref 6–20)
CO2: 22 mmol/L (ref 22–32)
Calcium: 9.2 mg/dL (ref 8.9–10.3)
Chloride: 101 mmol/L (ref 98–111)
Creatinine, Ser: 0.76 mg/dL (ref 0.44–1.00)
GFR, Estimated: >60 mL/min (ref >60)
Glucose, Bld: 284 mg/dL — ABNORMAL HIGH (ref 70–99)
Potassium: 3.8 mmol/L (ref 3.5–5.1)
Sodium: 132 mmol/L — ABNORMAL LOW (ref 135–145)
Total Bilirubin: 0.7 mg/dL (ref 0.3–1.2)
Total Protein: 7 g/dL (ref 6.5–8.1)

## 2021-11-01 LAB — URINALYSIS, ROUTINE W REFLEX MICROSCOPIC
Bilirubin Urine: NEGATIVE
Glucose, UA: NEGATIVE mg/dL
Hgb urine dipstick: NEGATIVE
Leukocytes,Ua: NEGATIVE
Nitrite: NEGATIVE
Protein, ur: NEGATIVE mg/dL
Specific Gravity, Urine: >1.03 — ABNORMAL HIGH (ref 1.005–1.030)
pH: 5.5 (ref 5.0–8.0)

## 2021-11-01 LAB — CBC
HCT: 43.7 % (ref 36.0–46.0)
Hemoglobin: 14.7 g/dL (ref 12.0–15.0)
MCH: 27.7 pg (ref 26.0–34.0)
MCHC: 33.6 g/dL (ref 30.0–36.0)
MCV: 82.5 fL (ref 80.0–100.0)
Platelets: 189 10*3/uL (ref 150–400)
RBC: 5.3 MIL/uL — ABNORMAL HIGH (ref 3.87–5.11)
RDW: 13.7 % (ref 11.5–15.5)
WBC: 9.8 10*3/uL (ref 4.0–10.5)
nRBC: 0 % (ref 0.0–0.2)

## 2021-11-01 LAB — POC URINE PREG, ED: Preg Test, Ur: NEGATIVE

## 2021-11-01 MED ORDER — IBUPROFEN 800 MG PO TABS: 800.0000 mg | ORAL_TABLET | Freq: Three times a day (TID) | ORAL | 0 refills | Status: DC | PRN

## 2021-11-01 MED ORDER — KETOROLAC TROMETHAMINE 30 MG/ML IJ SOLN
30.0000 mg | Freq: Once | INTRAMUSCULAR | Status: AC
  Administered 2021-11-01: 30 mg via INTRAVENOUS
  Filled 2021-11-01: qty 1

## 2021-11-01 MED ORDER — MORPHINE SULFATE (PF) 4 MG/ML IV SOLN
4.0000 mg | Freq: Once | INTRAVENOUS | Status: AC
  Administered 2021-11-01: 4 mg via INTRAVENOUS
  Filled 2021-11-01: qty 1

## 2021-11-01 MED ORDER — ONDANSETRON HCL 4 MG/2ML IJ SOLN
4.0000 mg | Freq: Once | INTRAMUSCULAR | Status: AC
  Administered 2021-11-01: 4 mg via INTRAVENOUS
  Filled 2021-11-01: qty 2

## 2021-11-01 MED ORDER — ONDANSETRON 4 MG PO TBDP: 4.0000 mg | ORAL_TABLET | Freq: Four times a day (QID) | ORAL | 0 refills | Status: DC | PRN

## 2021-11-01 MED ORDER — HYDROCODONE-ACETAMINOPHEN 5-325 MG PO TABS: 1.0000 | ORAL_TABLET | ORAL | 0 refills | Status: DC | PRN

## 2021-11-01 NOTE — Discharge Instructions (Signed)
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
## 2021-11-21 ENCOUNTER — Other Ambulatory Visit: Payer: Self-pay | Admitting: Family Medicine

## 2021-11-22 NOTE — Telephone Encounter (Signed)
Refilled 09/02/2021 #180 with 1 refill. Should last until 5/23. Requested Prescriptions  Pending Prescriptions Disp Refills   pantoprazole (PROTONIX) 40 MG tablet [Pharmacy Med Name: PANTOPRAZOLE 40MG  TABLETS] 180 tablet 1    Sig: TAKE 1 TABLET(40 MG) BY MOUTH TWICE DAILY     Gastroenterology: Proton Pump Inhibitors Passed - 11/21/2021  9:29 PM      Passed - Valid encounter within last 12 months    Recent Outpatient Visits          1 month ago BV (bacterial vaginosis)   [place], Megan P, DO   1 month ago Blepharitis of right lower eyelid, unspecified type   [facility], Megan P, DO   2 months ago 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, without long-term current use of insulin ([place])   [place], Megan P, DO   9 months ago Chronic bronchitis, unspecified chronic bronchitis type ([place])   [place], Megan P, DO   10 months ago Acute bronchitis with COPD [facility])   [place], Barb Merino, DO      Future Appointments            In 3 months [name], Barb Merino, DO [facility], [facility]

## 2021-12-18 ENCOUNTER — Other Ambulatory Visit: Payer: Self-pay | Admitting: Family Medicine

## 2021-12-20 NOTE — Telephone Encounter (Signed)
Montelukast refilled 09/02/2021 #90 1 refill - enough to until 02/2022. ?Cetirizine refilled 09/02/2021 #90 3 refills - 1 year supply. ?Requested Prescriptions  ?Pending Prescriptions Disp Refills  ?? montelukast (SINGULAIR) 10 MG tablet [Pharmacy Med Name: MONTELUKAST '10MG'$  TABLETS] 30 tablet   ?  Sig: TAKE 1 TABLET(10 MG) BY MOUTH AT BEDTIME  ?  ? Pulmonology:  Leukotriene Inhibitors Passed - 12/18/2021  8:14 AM  ?  ?  Passed - Valid encounter within last 12 months  ?  Recent Outpatient Visits   ?      ? 1 month ago BV (bacterial vaginosis)  ? [place] P, DO  ? 2 months ago Blepharitis of right lower eyelid, unspecified type  ? [place], Megan P, DO  ? 3 months ago 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, without long-term current use of insulin ([place])  ? [place], [place] P, DO  ? 10 months ago Chronic bronchitis, unspecified chronic bronchitis type ([place])  ? [place], Megan P, DO  ? 11 months ago Acute bronchitis with COPD ([place])  ? [place], [place] P, DO  ?  ?  ?Future Appointments   ?        ? Tomorrow Wynetta Emery, Barb Merino, DO [facility]  ? In 2 months Wynetta Emery, Megan P, DO [facility]  ?  ? ?  ?  ?  ?? cetirizine (ZYRTEC) 10 MG tablet [Pharmacy Med Name: CETIRIZINE '10MG'$  TABLETS] 90 tablet 3  ?  Sig: TAKE 1 TABLET(10 MG) BY MOUTH DAILY  ?  ? Ear, Nose, and Throat:  Antihistamines 2 Passed - 12/18/2021  8:14 AM  ?  ?  Passed - Cr in normal range and within 360 days  ?  Creatinine  ?Date Value Ref Range Status  ?04/09/2014 0.89 0.60 - 1.30 mg/dL Final  ? ?Creatinine, Ser  ?Date Value Ref Range Status  ?10/31/2021 0.76 0.44 - 1.00 mg/dL Final  ?   ?  ?  Passed - Valid encounter within last 12 months  ?  Recent Outpatient Visits   ?      ? 1 month ago BV (bacterial vaginosis)  ? [place] P, DO  ? 2 months  ago Blepharitis of right lower eyelid, unspecified type  ? [place], Megan P, DO  ? 3 months ago 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, without long-term current use of insulin ([place])  ? [place], [place] P, DO  ? 10 months ago Chronic bronchitis, unspecified chronic bronchitis type ([place])  ? [place], Megan P, DO  ? 11 months ago Acute bronchitis with COPD ([place])  ? [place], [place] P, DO  ?  ?  ?Future Appointments   ?        ? Tomorrow Wynetta Emery, Barb Merino, DO [facility]  ? In 2 months Wynetta Emery, Barb Merino, DO [facility]  ?  ? ?  ?  ?  ? ?

## 2021-12-21 ENCOUNTER — Encounter: Payer: Self-pay | Admitting: Family Medicine

## 2021-12-21 ENCOUNTER — Ambulatory Visit: Payer: BC Managed Care – PPO | Admitting: Family Medicine

## 2021-12-21 ENCOUNTER — Other Ambulatory Visit: Payer: Self-pay

## 2021-12-21 VITALS — BP 144/82 | Temp 98.3°F | Wt 257.4 lb

## 2021-12-21 DIAGNOSIS — K529 Noninfective gastroenteritis and colitis, unspecified: Secondary | ICD-10-CM | POA: Diagnosis not present

## 2021-12-21 DIAGNOSIS — H01119 Allergic dermatitis of unspecified eye, unspecified eyelid: Secondary | ICD-10-CM

## 2021-12-21 MED ORDER — HYDROCORTISONE 0.5 % EX OINT: 1.0000 "application " | TOPICAL_OINTMENT | Freq: Every day | CUTANEOUS | 0 refills | Status: DC

## 2021-12-21 MED ORDER — ONDANSETRON HCL 4 MG PO TABS: 4.0000 mg | ORAL_TABLET | Freq: Three times a day (TID) | ORAL | 0 refills | Status: DC | PRN

## 2021-12-21 NOTE — Progress Notes (Signed)
? ?BP (!) 144/82   Temp 98.3 ?F (36.8 ?C)   Wt 257 lb 6.4 oz (116.8 kg)   LMP 07/13/2020 (Approximate)   SpO2 98%   BMI 34.91 kg/m?   ? ?Subjective:  ? ? Patient ID: [name], female    DOB: 1976/06/07, 46 y.o.   MRN: [id number] ? ?HPI: ?[name] is a 46 y.o. female ? ?Chief Complaint  ?Patient presents with  ? eye concern  ?  Patient states her left eye was red,itchy, burning and swollen. Left was worse then right eye.   ? Abdominal Pain  ?  Patient states she started having abdominal pain today and felt very hot but no fever.   ? ?RASH ?Duration:  couple of days  ?Location: eye lids  ?Itching: yes ?Burning: yes ?Redness: yes ?Oozing: no ?Scaling: no ?Blisters: no ?Painful: no ?Fevers: no ?Change in detergents/soaps/personal care products: no ?Recent illness: no ?Recent travel:no ?History of same: no ?Context: worse ?Alleviating factors: nothing ?Treatments attempted:nothing ?Shortness of breath: no  ?Throat/tongue swelling: no ?Myalgias/arthralgias: no ? ?Stomach has been gurgling and she feels like she's flushing. 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 ? ?Relevant past medical, surgical, family and social history reviewed and updated as indicated. Interim medical history since our last visit reviewed. ?Allergies and medications reviewed and updated. ? ?Review of Systems  ?Constitutional: Negative.   ?Eyes: Negative.   ?Respiratory: Negative.    ?Cardiovascular: Negative.   ?Gastrointestinal:  Positive for abdominal pain and nausea. Negative for abdominal distention, anal bleeding, blood in stool, constipation, diarrhea, rectal pain and vomiting.  ?Musculoskeletal: Negative.   ?Skin:  Positive for rash. Negative for color change, pallor and wound.  ?Psychiatric/Behavioral: Negative.    ? ?Per HPI unless specifically indicated above ? ?   ?Objective:  ?  ?BP (!) 144/82   Temp 98.3 ?F (36.8 ?C)   Wt 257 lb 6.4 oz (116.8 kg)   LMP 07/13/2020 (Approximate)   SpO2 98%   BMI  34.91 kg/m?   ?Wt Readings from Last 3 Encounters:  ?12/21/21 257 lb 6.4 oz (116.8 kg)  ?10/31/21 255 lb (115.7 kg)  ?10/22/21 255 lb 6.4 oz (115.8 kg)  ?  ?Physical Exam ?Vitals and nursing note reviewed.  ?Constitutional:   ?   General: She is not in acute distress. ?   Appearance: Normal appearance. She is not ill-appearing, toxic-appearing or diaphoretic.  ?HENT:  ?   Head: Normocephalic and atraumatic.  ?   Right Ear: External ear normal.  ?   Left Ear: External ear normal.  ?   Nose: Nose normal.  ?   Mouth/Throat:  ?   Mouth: Mucous membranes are moist.  ?   Pharynx: Oropharynx is clear.  ?Eyes:  ?   General: No scleral icterus.    ?   Right eye: No discharge.     ?   Left eye: No discharge.  ?   Extraocular Movements: Extraocular movements intact.  ?   Conjunctiva/sclera: Conjunctivae normal.  ?   Pupils: Pupils are equal, round, and reactive to light.  ?Cardiovascular:  ?   Rate and Rhythm: Normal rate and regular rhythm.  ?   Pulses: Normal pulses.  ?   Heart sounds: Normal heart sounds. No murmur heard. ?  No friction rub. No gallop.  ?Pulmonary:  ?   Effort: Pulmonary effort is normal. No respiratory distress.  ?   Breath sounds: Normal breath sounds. No stridor. No wheezing, rhonchi or rales.  ?  Chest:  ?   Chest wall: No tenderness.  ?Musculoskeletal:     ?   General: Normal range of motion.  ?   Cervical back: Normal range of motion and neck supple.  ?Skin: ?   General: Skin is warm and dry.  ?   Capillary Refill: Capillary refill takes less than 2 seconds.  ?   Coloration: Skin is not jaundiced or pale.  ?   Findings: Erythema (eyelids bilaterally) present. No bruising, lesion or rash.  ?Neurological:  ?   General: No focal deficit present.  ?   Mental Status: She is alert and oriented to person, place, and time. Mental status is at baseline.  ?Psychiatric:     ?   Mood and Affect: Mood normal.     ?   Behavior: Behavior normal.     ?   Thought Content: Thought content normal.     ?   Judgment:  Judgment normal.  ? ? ?Results for orders placed or performed during the hospital encounter of 11/01/21  ?Comprehensive metabolic panel  ?Result Value Ref Range  ? Sodium 132 (L) 135 - 145 mmol/L  ? Potassium 3.8 3.5 - 5.1 mmol/L  ? Chloride 101 98 - 111 mmol/L  ? CO2 22 22 - 32 mmol/L  ? Glucose, Bld 284 (H) 70 - 99 mg/dL  ? BUN 15 6 - 20 mg/dL  ? Creatinine, Ser 0.76 0.44 - 1.00 mg/dL  ? Calcium 9.2 8.9 - 10.3 mg/dL  ? Total Protein 7.0 6.5 - 8.1 g/dL  ? Albumin 4.3 3.5 - 5.0 g/dL  ? AST 31 15 - 41 U/L  ? ALT 37 0 - 44 U/L  ? Alkaline Phosphatase 67 38 - 126 U/L  ? Total Bilirubin 0.7 0.3 - 1.2 mg/dL  ? GFR, Estimated >60 >60 mL/min  ? Anion gap 9 5 - 15  ?CBC  ?Result Value Ref Range  ? WBC 9.8 4.0 - 10.5 K/uL  ? RBC 5.30 (H) 3.87 - 5.11 MIL/uL  ? Hemoglobin 14.7 12.0 - 15.0 g/dL  ? HCT 43.7 36.0 - 46.0 %  ? MCV 82.5 80.0 - 100.0 fL  ? MCH 27.7 26.0 - 34.0 pg  ? MCHC 33.6 30.0 - 36.0 g/dL  ? RDW 13.7 11.5 - 15.5 %  ? Platelets 189 150 - 400 K/uL  ? nRBC 0.0 0.0 - 0.2 %  ?Urinalysis, Routine w reflex microscopic Urine, Clean Catch  ?Result Value Ref Range  ? Color, Urine YELLOW YELLOW  ? APPearance CLEAR CLEAR  ? Specific Gravity, Urine >1.030 (H) 1.005 - 1.030  ? pH 5.5 5.0 - 8.0  ? Glucose, UA NEGATIVE NEGATIVE mg/dL  ? Hgb urine dipstick NEGATIVE NEGATIVE  ? Bilirubin Urine NEGATIVE NEGATIVE  ? Ketones, ur TRACE (A) NEGATIVE mg/dL  ? Protein, ur NEGATIVE NEGATIVE mg/dL  ? Nitrite NEGATIVE NEGATIVE  ? Leukocytes,Ua NEGATIVE NEGATIVE  ?POC Urine Pregnancy, ED  (not at [facility])  ?Result Value Ref Range  ? Preg Test, Ur Negative Negative  ? ?   ?Assessment & Plan:  ? ?Problem List Items Addressed This Visit   ?None ?Visit Diagnoses   ? ? Eyelid dermatitis, allergic/contact    -  Primary  ? Will treat with hydrocortisone daily. Call with any concerns. Continue allergy meds.   ? Gastroenteritis      ? Fluids. Rest. Zofran PRN. BRAT Diet. Call if not getting better or getting worse.   ? ?  ?  ? ?Follow up  plan: ?Return if  symptoms worsen or fail to improve. ? ? ? ? ? ?

## 2022-01-16 ENCOUNTER — Other Ambulatory Visit: Payer: Self-pay | Admitting: Family Medicine

## 2022-01-18 NOTE — Telephone Encounter (Signed)
Requested Prescriptions  ?Pending Prescriptions Disp Refills  ?? metoprolol succinate (TOPROL-XL) 25 MG 24 hr tablet [Pharmacy Med Name: METOPROLOL ER SUCCINATE '25MG'$  TABS] 30 tablet 3  ?  Sig: TAKE 1/2 TO 1 TABLET(12.5 TO 25 MG) BY MOUTH DAILY  ?  ? Cardiovascular:  Beta Blockers Failed - 01/16/2022  5:21 PM  ?  ?  Failed - Last BP in normal range  ?  BP Readings from Last 1 Encounters:  ?12/21/21 (!) 144/82  ?   ?  ?  Passed - Last Heart Rate in normal range  ?  Pulse Readings from Last 1 Encounters:  ?11/01/21 84  ?   ?  ?  Passed - Valid encounter within last 6 months  ?  Recent Outpatient Visits   ?      ? 4 weeks ago Eyelid dermatitis, allergic/contact  ? [place], [place] P, DO  ? 2 months ago BV (bacterial vaginosis)  ? [place] P, DO  ? 3 months ago Blepharitis of right lower eyelid, unspecified type  ? [place], Megan P, DO  ? 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 [place], Megan P, DO  ? 11 months ago Chronic bronchitis, unspecified chronic bronchitis type ([place])  ? [place], [place] P, DO  ?  ?  ?Future Appointments   ?        ? In 1 month [name], Barb Merino, DO [facility]  ?  ? ?  ?  ?  ? ? ?

## 2022-01-26 ENCOUNTER — Other Ambulatory Visit: Payer: Self-pay | Admitting: Family Medicine

## 2022-01-27 NOTE — Telephone Encounter (Signed)
Requested medication (s) are due for refill today: yes ? ?Requested medication (s) are on the active medication list: yes ? ?Last refill:  02/02/21 #1/11 ? ?Future visit scheduled: yes ? ?Notes to clinic:  Unable to refill per protocol, medication not assigned to the refill protocol. ? ? ?  ?Requested Prescriptions  ?Pending Prescriptions Disp Refills  ? TRELEGY ELLIPTA 100-62.5-25 MCG/ACT AEPB [Pharmacy Med Name: TRELEGY ELLIPTA 100-62.5MCG INH 30P] 60 each   ?  Sig: INHALE 1 PUFF INTO THE LUNGS DAILY  ?  ? There is no refill protocol information for this order  ?  ? ?

## 2022-03-04 ENCOUNTER — Encounter: Payer: Self-pay | Admitting: Family Medicine

## 2022-03-04 ENCOUNTER — Ambulatory Visit (INDEPENDENT_AMBULATORY_CARE_PROVIDER_SITE_OTHER): Payer: BC Managed Care – PPO | Admitting: Family Medicine

## 2022-03-04 ENCOUNTER — Other Ambulatory Visit (HOSPITAL_COMMUNITY)
Admission: RE | Admit: 2022-03-04 | Discharge: 2022-03-04 | Disposition: A | Discharge status: Home / Self Care | Payer: BC Managed Care – PPO | Source: Ambulatory Visit | Attending: Family Medicine | Admitting: Family Medicine

## 2022-03-04 VITALS — BP 137/85 | HR 89 | Temp 98.3°F | Wt 253.0 lb

## 2022-03-04 DIAGNOSIS — E1122 Type 2 diabetes mellitus with diabetic chronic kidney disease: Secondary | ICD-10-CM

## 2022-03-04 DIAGNOSIS — B3731 Acute candidiasis of vulva and vagina: Secondary | ICD-10-CM

## 2022-03-04 DIAGNOSIS — Z124 Encounter for screening for malignant neoplasm of cervix: Secondary | ICD-10-CM

## 2022-03-04 DIAGNOSIS — R300 Dysuria: Secondary | ICD-10-CM

## 2022-03-04 DIAGNOSIS — I129 Hypertensive chronic kidney disease with stage 1 through stage 4 chronic kidney disease, or unspecified chronic kidney disease: Secondary | ICD-10-CM | POA: Diagnosis not present

## 2022-03-04 DIAGNOSIS — N898 Other specified noninflammatory disorders of vagina: Secondary | ICD-10-CM

## 2022-03-04 DIAGNOSIS — Z1231 Encounter for screening mammogram for malignant neoplasm of breast: Secondary | ICD-10-CM

## 2022-03-04 DIAGNOSIS — D500 Iron deficiency anemia secondary to blood loss (chronic): Secondary | ICD-10-CM

## 2022-03-04 DIAGNOSIS — J42 Unspecified chronic bronchitis: Secondary | ICD-10-CM

## 2022-03-04 DIAGNOSIS — F3175 Bipolar disorder, in partial remission, most recent episode depressed: Secondary | ICD-10-CM

## 2022-03-04 DIAGNOSIS — N181 Chronic kidney disease, stage 1: Secondary | ICD-10-CM

## 2022-03-04 DIAGNOSIS — Z0000 Encounter for general adult medical examination without abnormal findings: Secondary | ICD-10-CM

## 2022-03-04 LAB — MICROALBUMIN, URINE WAIVED
Creatinine, Urine Waived: 50 mg/dL (ref 10–300)
Microalb, Ur Waived: 10 mg/L (ref 0–19)

## 2022-03-04 LAB — URINALYSIS, ROUTINE W REFLEX MICROSCOPIC
Bilirubin, UA: NEGATIVE
Ketones, UA: NEGATIVE
Leukocytes,UA: NEGATIVE
Nitrite, UA: NEGATIVE
Protein,UA: NEGATIVE
RBC, UA: NEGATIVE
Specific Gravity, UA: 1.02 (ref 1.005–1.030)
Urobilinogen, Ur: 0.2 mg/dL (ref 0.2–1.0)
pH, UA: 6 (ref 5.0–7.5)

## 2022-03-04 LAB — WET PREP FOR TRICH, YEAST, CLUE
Clue Cell Exam: NEGATIVE
Trichomonas Exam: NEGATIVE
Yeast Exam: POSITIVE — AB

## 2022-03-04 LAB — BAYER DCA HB A1C WAIVED: HB A1C (BAYER DCA - WAIVED): 9.5 % — ABNORMAL HIGH (ref 4.8–5.6)

## 2022-03-04 MED ORDER — FLUCONAZOLE 150 MG PO TABS: 150.0000 mg | ORAL_TABLET | Freq: Every day | ORAL | 2 refills | Status: DC

## 2022-03-04 MED ORDER — NAPROXEN 500 MG PO TABS: ORAL_TABLET | ORAL | 1 refills | Status: DC

## 2022-03-04 MED ORDER — MONTELUKAST SODIUM 10 MG PO TABS
ORAL_TABLET | ORAL | 1 refills | Status: DC
Start: 2022-03-04 — End: 2022-09-16

## 2022-03-04 MED ORDER — TRAZODONE HCL 150 MG PO TABS: 150.0000 mg | ORAL_TABLET | Freq: Every day | ORAL | 1 refills | Status: DC

## 2022-03-04 MED ORDER — METOPROLOL SUCCINATE ER 25 MG PO TB24: ORAL_TABLET | ORAL | 1 refills | Status: DC

## 2022-03-04 MED ORDER — PANTOPRAZOLE SODIUM 40 MG PO TBEC: 40.0000 mg | DELAYED_RELEASE_TABLET | Freq: Two times a day (BID) | ORAL | 1 refills | Status: DC

## 2022-03-04 MED ORDER — ALBUTEROL SULFATE HFA 108 (90 BASE) MCG/ACT IN AERS: 2.0000 | INHALATION_SPRAY | Freq: Four times a day (QID) | RESPIRATORY_TRACT | 6 refills | Status: DC | PRN

## 2022-03-04 MED ORDER — LISINOPRIL 5 MG PO TABS
5.0000 mg | ORAL_TABLET | Freq: Every day | ORAL | 1 refills | Status: DC
Start: 2022-03-04 — End: 2022-09-16

## 2022-03-04 MED ORDER — OZEMPIC (0.25 OR 0.5 MG/DOSE) 2 MG/3ML ~~LOC~~ SOPN: PEN_INJECTOR | SUBCUTANEOUS | 2 refills | Status: DC

## 2022-03-04 MED ORDER — METFORMIN HCL 500 MG PO TABS: ORAL_TABLET | ORAL | 1 refills | Status: DC
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
## 2022-03-04 NOTE — Patient Instructions (Signed)
Please call to schedule your mammogram and/or bone density: ?[facility]  ?Address: [address], [place], [place] [address] ?Phone: [phone]  ?

## 2022-03-05 LAB — COMPREHENSIVE METABOLIC PANEL
ALT: 30 IU/L (ref 0–32)
AST: 16 IU/L (ref 0–40)
Albumin/Globulin Ratio: 2.2 (ref 1.2–2.2)
Albumin: 4.7 g/dL (ref 3.8–4.8)
Alkaline Phosphatase: 90 IU/L (ref 44–121)
BUN/Creatinine Ratio: 15 (ref 9–23)
BUN: 13 mg/dL (ref 6–24)
Bilirubin Total: 0.2 mg/dL (ref 0.0–1.2)
CO2: 20 mmol/L (ref 20–29)
Calcium: 9.9 mg/dL (ref 8.7–10.2)
Chloride: 98 mmol/L (ref 96–106)
Creatinine, Ser: 0.87 mg/dL (ref 0.57–1.00)
Globulin, Total: 2.1 g/dL (ref 1.5–4.5)
Glucose: 335 mg/dL — ABNORMAL HIGH (ref 70–99)
Potassium: 4.3 mmol/L (ref 3.5–5.2)
Sodium: 135 mmol/L (ref 134–144)
Total Protein: 6.8 g/dL (ref 6.0–8.5)
eGFR: 84 mL/min/{1.73_m2} (ref >59)

## 2022-03-05 LAB — CBC WITH DIFFERENTIAL/PLATELET
Basophils Absolute: 0.1 10*3/uL (ref 0.0–0.2)
Basos: 1 %
EOS (ABSOLUTE): 0.1 10*3/uL (ref 0.0–0.4)
Eos: 1 %
Hematocrit: 43.7 % (ref 34.0–46.6)
Hemoglobin: 14.4 g/dL (ref 11.1–15.9)
Immature Grans (Abs): 0.1 10*3/uL (ref 0.0–0.1)
Immature Granulocytes: 1 %
Lymphocytes Absolute: 3.3 10*3/uL — ABNORMAL HIGH (ref 0.7–3.1)
Lymphs: 35 %
MCH: 27.4 pg (ref 26.6–33.0)
MCHC: 33 g/dL (ref 31.5–35.7)
MCV: 83 fL (ref 79–97)
Monocytes Absolute: 0.8 10*3/uL (ref 0.1–0.9)
Monocytes: 8 %
Neutrophils Absolute: 5 10*3/uL (ref 1.4–7.0)
Neutrophils: 54 %
Platelets: 193 10*3/uL (ref 150–450)
RBC: 5.25 x10E6/uL (ref 3.77–5.28)
RDW: 13.3 % (ref 11.7–15.4)
WBC: 9.3 10*3/uL (ref 3.4–10.8)

## 2022-03-05 LAB — LIPID PANEL W/O CHOL/HDL RATIO
Cholesterol, Total: 149 mg/dL (ref 100–199)
HDL: 24 mg/dL — ABNORMAL LOW (ref >39)
LDL Chol Calc (NIH): 54 mg/dL (ref 0–99)
Triglycerides: 477 mg/dL — ABNORMAL HIGH (ref 0–149)
VLDL Cholesterol Cal: 71 mg/dL — ABNORMAL HIGH (ref 5–40)

## 2022-03-05 LAB — TSH: TSH: 1.4 u[IU]/mL (ref 0.450–4.500)

## 2022-03-08 LAB — CYTOLOGY - PAP
Comment: NEGATIVE
Diagnosis: NEGATIVE
High risk HPV: NEGATIVE

## 2022-03-10 NOTE — Assessment & Plan Note (Signed)
Not doing great with A1c of 9.5. We will start ozempic and recheck 3 months. Call with any concerns. Continue to monitor.

## 2022-03-10 NOTE — Assessment & Plan Note (Signed)
Under good control on current regimen. Continue current regimen. Continue to monitor. Call with any concerns. Refills given. Labs drawn today.   

## 2022-03-10 NOTE — Assessment & Plan Note (Signed)
Rechecking labs today. Await results. Treat as needed.  °

## 2022-03-10 NOTE — Assessment & Plan Note (Signed)
Continue to follow with psych. Call with any concerns. Continue to monitor.

## 2022-04-04 ENCOUNTER — Other Ambulatory Visit: Payer: Self-pay | Admitting: Family Medicine

## 2022-04-05 NOTE — Telephone Encounter (Signed)
Early request via Interface. LRF 03/04/22  #90  1 refill. Receipt confirmed 03/04/22  At 4:58 .rcpro

## 2022-05-12 ENCOUNTER — Other Ambulatory Visit: Payer: Self-pay | Admitting: Family Medicine

## 2022-05-12 NOTE — Telephone Encounter (Signed)
rx was sent to pharmacy on 03/04/22 #180/1. Requested Prescriptions  Pending Prescriptions Disp Refills  . pantoprazole (PROTONIX) 40 MG tablet [Pharmacy Med Name: PANTOPRAZOLE '40MG'$  TABLETS] 180 tablet 1    Sig: TAKE 1 TABLET(40 MG) BY MOUTH TWICE DAILY     Gastroenterology: Proton Pump Inhibitors Passed - 05/12/2022  8:22 AM      Passed - Valid encounter within last 12 months    Recent Outpatient Visits          2 months ago Routine general medical examination at a health care facility   [facility], [place] P, DO   4 months ago Eyelid dermatitis, allergic/contact   [facility], Megan P, DO   6 months ago BV (bacterial vaginosis)   [facility], Megan P, DO   7 months ago Blepharitis of right lower eyelid, unspecified type   [facility], Megan P, DO   8 months ago 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, [place], DO      Future Appointments            In 3 weeks Wynetta Emery, Barb Merino, DO [place], [facility]

## 2022-06-06 ENCOUNTER — Ambulatory Visit: Payer: BC Managed Care – PPO | Admitting: Family Medicine

## 2022-06-06 ENCOUNTER — Encounter: Payer: Self-pay | Admitting: Family Medicine

## 2022-06-06 VITALS — BP 119/72 | HR 82 | Temp 98.2°F | Wt 248.0 lb

## 2022-06-06 DIAGNOSIS — E1122 Type 2 diabetes mellitus with diabetic chronic kidney disease: Secondary | ICD-10-CM

## 2022-06-06 DIAGNOSIS — N181 Chronic kidney disease, stage 1: Secondary | ICD-10-CM

## 2022-06-06 DIAGNOSIS — Z23 Encounter for immunization: Secondary | ICD-10-CM | POA: Diagnosis not present

## 2022-06-06 LAB — BAYER DCA HB A1C WAIVED: HB A1C (BAYER DCA - WAIVED): 7 % — ABNORMAL HIGH (ref 4.8–5.6)

## 2022-06-06 MED ORDER — OZEMPIC (0.25 OR 0.5 MG/DOSE) 2 MG/3ML ~~LOC~~ SOPN: 0.5000 mg | PEN_INJECTOR | SUBCUTANEOUS | 1 refills | Status: DC

## 2022-06-06 MED ORDER — NITROGLYCERIN 0.4 MG SL SUBL: 0.4000 mg | SUBLINGUAL_TABLET | SUBLINGUAL | 12 refills | Status: AC | PRN

## 2022-06-06 NOTE — Progress Notes (Signed)
BP 119/72   Pulse 82   Temp 98.2 F (36.8 C)   Wt 248 lb (112.5 kg)   LMP 07/13/2020 (Approximate)   SpO2 98%   BMI 33.63 kg/m    Subjective:    Patient ID: [name], female    DOB: June 13, 1976, 46 y.o.   MRN: [id number]  HPI: [name] is a 46 y.o. female  Chief Complaint  Patient presents with   Diabetes   DIABETES Hypoglycemic episodes:no Polydipsia/polyuria: no Visual disturbance: no Chest pain: no Paresthesias: no Glucose Monitoring: no  Accucheck frequency: Not Checking Taking Insulin?: no Blood Pressure Monitoring: not checking Retinal Examination: Not up to Date Foot Exam: Up to Date Diabetic Education: Completed Pneumovax: Up to Date Influenza:  given today Aspirin: no   Relevant past medical, surgical, family and social history reviewed and updated as indicated. Interim medical history since our last visit reviewed. Allergies and medications reviewed and updated.  Review of Systems  Constitutional: Negative.   Respiratory: Negative.    Cardiovascular: Negative.   Gastrointestinal: Negative.   Musculoskeletal: Negative.   Neurological: Negative.   Psychiatric/Behavioral: Negative.      Per HPI unless specifically indicated above     Objective:    BP 119/72   Pulse 82   Temp 98.2 F (36.8 C)   Wt 248 lb (112.5 kg)   LMP 07/13/2020 (Approximate)   SpO2 98%   BMI 33.63 kg/m   Wt Readings from Last 3 Encounters:  06/06/22 248 lb (112.5 kg)  03/04/22 253 lb (114.8 kg)  12/21/21 257 lb 6.4 oz (116.8 kg)    Physical Exam Vitals and nursing note reviewed.  Constitutional:      General: She is not in acute distress.    Appearance: Normal appearance. She is obese. She is not ill-appearing, toxic-appearing or diaphoretic.  HENT:     Head: Normocephalic and atraumatic.     Right Ear: External ear normal.     Left Ear: External ear normal.     Nose: Nose normal.     Mouth/Throat:     Mouth: Mucous  membranes are moist.     Pharynx: Oropharynx is clear.  Eyes:     General: No scleral icterus.       Right eye: No discharge.        Left eye: No discharge.     Extraocular Movements: Extraocular movements intact.     Conjunctiva/sclera: Conjunctivae normal.     Pupils: Pupils are equal, round, and reactive to light.  Cardiovascular:     Rate and Rhythm: Normal rate and regular rhythm.     Pulses: Normal pulses.     Heart sounds: Normal heart sounds. No murmur heard.    No friction rub. No gallop.  Pulmonary:     Effort: Pulmonary effort is normal. No respiratory distress.     Breath sounds: Normal breath sounds. No stridor. No wheezing, rhonchi or rales.  Chest:     Chest wall: No tenderness.  Musculoskeletal:        General: Normal range of motion.     Cervical back: Normal range of motion and neck supple.  Skin:    General: Skin is warm and dry.     Capillary Refill: Capillary refill takes less than 2 seconds.     Coloration: Skin is not jaundiced or pale.     Findings: No bruising, erythema, lesion or rash.  Neurological:     General: No focal deficit present.  Mental Status: She is alert and oriented to person, place, and time. Mental status is at baseline.  Psychiatric:        Mood and Affect: Mood normal.        Behavior: Behavior normal.        Thought Content: Thought content normal.        Judgment: Judgment normal.     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 Will continue current regimen and recheck 3 months. Call with any concerns.       Relevant Medications   Semaglutide,0.25 or 0.'5MG'$ /DOS, (OZEMPIC, 0.25 OR 0.5 MG/DOSE,) 2 MG/3ML SOPN    Other Relevant Orders   Bayer DCA Hb A1c Waived (Completed)     Follow up plan: Return in about 3 months (around 09/06/2022).

## 2022-06-06 NOTE — Assessment & Plan Note (Signed)
Doing great with A1c of 7.0. Will continue current regimen and recheck 3 months. Call with any concerns.

## 2022-06-06 NOTE — Addendum Note (Signed)
Addended by: Louanna Raw on: 06/06/2022 04:59 PM   Modules accepted: Orders

## 2022-07-22 ENCOUNTER — Encounter: Payer: Self-pay | Admitting: Family Medicine

## 2022-07-22 ENCOUNTER — Other Ambulatory Visit: Payer: Self-pay | Admitting: Family Medicine

## 2022-08-14 ENCOUNTER — Other Ambulatory Visit: Payer: Self-pay | Admitting: Family Medicine

## 2022-08-15 NOTE — Telephone Encounter (Signed)
Requested by interface surescripts. Medication discontinued 03/04/22. Requested Prescriptions  Signed Prescriptions Disp Refills   cetirizine (ZYRTEC) 10 MG tablet 90 tablet 0    Sig: TAKE 1 TABLET(10 MG) BY MOUTH DAILY     Ear, Nose, and Throat:  Antihistamines 2 Passed - 08/14/2022  9:58 PM      Passed - Cr in normal range and within 360 days    Creatinine  Date Value Ref Range Status  04/09/2014 0.89 0.60 - 1.30 mg/dL Final   Creatinine, Ser  Date Value Ref Range Status  03/04/2022 0.87 0.57 - 1.00 mg/dL Final         Passed - Valid encounter within last 12 months    Recent Outpatient Visits          2 months ago 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, without long-term current use of insulin ([name])   [place], Megan P, DO   5 months ago Routine general medical examination at a health care facility   [facility], [place] P, DO   7 months ago Eyelid dermatitis, allergic/contact   [facility] [place], Megan P, DO   9 months ago BV (bacterial vaginosis)   [facility], Megan P, DO   10 months ago Blepharitis of right lower eyelid, unspecified type   [facility] [place], Barb Merino, DO      Future Appointments            In 3 weeks Wynetta Emery, Barb Merino, DO [place], [facility]           Refused Prescriptions Disp Refills  . metroNIDAZOLE (FLAGYL) 500 MG tablet [Pharmacy Med Name: METRONIDAZOLE '500MG'$  TABLETS] 14 tablet 0    Sig: TAKE 1 TABLET(500 MG) BY MOUTH TWICE DAILY     Off-Protocol Failed - 08/14/2022  9:58 PM      Failed - Medication not assigned to a protocol, review manually.      Passed - Valid encounter within last 12 months    Recent Outpatient Visits          2 months ago 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, without long-term current use of insulin ([name])   [place], Megan P,  DO   5 months ago Routine general medical examination at a health care facility   [facility], [place] P, DO   7 months ago Eyelid dermatitis, allergic/contact   [facility] [place], Megan P, DO   9 months ago BV (bacterial vaginosis)   [facility], Megan P, DO   10 months ago Blepharitis of right lower eyelid, unspecified type   [place], Barb Merino, DO      Future Appointments            In 3 weeks Wynetta Emery, Barb Merino, DO [employer], [facility]

## 2022-08-15 NOTE — Telephone Encounter (Signed)
Requested Prescriptions  Pending Prescriptions Disp Refills  . metroNIDAZOLE (FLAGYL) 500 MG tablet [Pharmacy Med Name: METRONIDAZOLE '500MG'$  TABLETS] 14 tablet 0    Sig: TAKE 1 TABLET(500 MG) BY MOUTH TWICE DAILY     Off-Protocol Failed - 08/14/2022  9:58 PM      Failed - Medication not assigned to a protocol, review manually.      Passed - Valid encounter within last 12 months    Recent Outpatient Visits          2 months ago 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, without long-term current use of insulin ([place])   [place], Megan P, DO   5 months ago Routine general medical examination at a health care facility   [facility], [place] P, DO   7 months ago Eyelid dermatitis, allergic/contact   [facility] [place], Megan P, DO   9 months ago BV (bacterial vaginosis)   [facility], Megan P, DO   10 months ago Blepharitis of right lower eyelid, unspecified type   [place], Barb Merino, DO      Future Appointments            In 3 weeks Wynetta Emery, Barb Merino, DO [place], [facility]           . cetirizine (ZYRTEC) 10 MG tablet [Pharmacy Med Name: CETIRIZINE '10MG'$  TABLETS] 90 tablet 0    Sig: TAKE 1 TABLET(10 MG) BY MOUTH DAILY     Ear, Nose, and Throat:  Antihistamines 2 Passed - 08/14/2022  9:58 PM      Passed - Cr in normal range and within 360 days    Creatinine  Date Value Ref Range Status  04/09/2014 0.89 0.60 - 1.30 mg/dL Final   Creatinine, Ser  Date Value Ref Range Status  03/04/2022 0.87 0.57 - 1.00 mg/dL Final         Passed - Valid encounter within last 12 months    Recent Outpatient Visits          2 months ago 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, without long-term current use of insulin ([place])   [place], Megan P, DO   5 months ago Routine general medical examination at a  health care facility   [facility], [place] P, DO   7 months ago Eyelid dermatitis, allergic/contact   [place], Megan P, DO   9 months ago BV (bacterial vaginosis)   [facility], Megan P, DO   10 months ago Blepharitis of right lower eyelid, unspecified type   [place], Barb Merino, DO      Future Appointments            In 3 weeks Wynetta Emery, Barb Merino, DO [employer], [facility]

## 2022-08-30 ENCOUNTER — Encounter: Payer: Self-pay | Admitting: Family Medicine

## 2022-08-30 NOTE — Telephone Encounter (Signed)
Pt scheduled virtually at 10 am tomorrow

## 2022-08-31 ENCOUNTER — Encounter: Payer: Self-pay | Admitting: Family Medicine

## 2022-08-31 ENCOUNTER — Ambulatory Visit: Payer: BC Managed Care – PPO | Admitting: Family Medicine

## 2022-08-31 VITALS — BP 114/74 | HR 91 | Temp 98.3°F | Wt 244.1 lb

## 2022-08-31 DIAGNOSIS — R051 Acute cough: Secondary | ICD-10-CM

## 2022-08-31 DIAGNOSIS — J069 Acute upper respiratory infection, unspecified: Secondary | ICD-10-CM

## 2022-08-31 MED ORDER — HYDROCOD POLI-CHLORPHE POLI ER 10-8 MG/5ML PO SUER: 5.0000 mL | Freq: Two times a day (BID) | ORAL | 0 refills | Status: DC | PRN

## 2022-08-31 MED ORDER — BENZONATATE 200 MG PO CAPS: 200.0000 mg | ORAL_CAPSULE | Freq: Two times a day (BID) | ORAL | 0 refills | Status: DC | PRN

## 2022-08-31 MED ORDER — TRIAMCINOLONE ACETONIDE 40 MG/ML IJ SUSP
40.0000 mg | Freq: Once | INTRAMUSCULAR | Status: AC
  Administered 2022-08-31: 40 mg via INTRAMUSCULAR

## 2022-08-31 MED ORDER — PREDNISONE 10 MG PO TABS: ORAL_TABLET | ORAL | 0 refills | Status: DC

## 2022-08-31 NOTE — Progress Notes (Signed)
BP 114/74   Pulse 91   Temp 98.3 F (36.8 C) (Oral)   Wt 244 lb 1.6 oz (110.7 kg)   LMP 07/13/2020 (Approximate)   SpO2 97%   BMI 33.11 kg/m    Subjective:    Patient ID: [name], female    DOB: [birth year], 46 y.o.   MRN: [id number]  HPI: [name] is a 46 y.o. female  Chief Complaint  Patient presents with   Cough    Patient says she became symptomatic on Saturday. Patient says she has tried NyQuil and says when she took the medication it caused her feet to do crazy things and she stopped taking the medication.    Fever   Generalized Body Aches   Headache   Chills   Anorexia   UPPER RESPIRATORY TRACT INFECTION Duration: 4-5 days Worst symptom: cough and fatigue Fever: yes Cough: yes Shortness of breath: yes Wheezing: yes Chest pain: no Chest tightness: yes Chest congestion: yes Nasal congestion: yes Runny nose: yes Post nasal drip: yes Sneezing: no Sore throat: yes Swollen glands: no Sinus pressure: yes Headache: yes Face pain: yes Toothache: no Ear pain: no  Ear pressure: yes, bilateral  Eyes red/itching:no Eye drainage/crusting: no  Vomiting: no Rash: no Fatigue: yes Sick contacts: yes Strep contacts: no  Context: stable Recurrent sinusitis: no Relief with OTC cold/cough medications: no  Treatments attempted: nyquil    Relevant past medical, surgical, family and social history reviewed and updated as indicated. Interim medical history since our last visit reviewed. Allergies and medications reviewed and updated.  Review of Systems  Per HPI unless specifically indicated above     Objective:    BP 114/74   Pulse 91   Temp 98.3 F (36.8 C) (Oral)   Wt 244 lb 1.6 oz (110.7 kg)   LMP 07/13/2020 (Approximate)   SpO2 97%   BMI 33.11 kg/m   Wt Readings from Last 3 Encounters:  08/31/22 244 lb 1.6 oz (110.7 kg)  06/06/22 248 lb (112.5 kg)  03/04/22 253 lb (114.8 kg)    Physical Exam Vitals and  nursing note reviewed.  Constitutional:      General: She is not in acute distress.    Appearance: Normal appearance. She is obese. She is not ill-appearing, toxic-appearing or diaphoretic.  HENT:     Head: Normocephalic and atraumatic.     Right Ear: Tympanic membrane, ear canal and external ear normal.     Left Ear: Tympanic membrane, ear canal and external ear normal.     Nose: Congestion and rhinorrhea present.     Mouth/Throat:     Mouth: Mucous membranes are moist.     Pharynx: Oropharynx is clear. No oropharyngeal exudate or posterior oropharyngeal erythema.  Eyes:     General: No scleral icterus.       Right eye: No discharge.        Left eye: No discharge.     Extraocular Movements: Extraocular movements intact.     Conjunctiva/sclera: Conjunctivae normal.     Pupils: Pupils are equal, round, and reactive to light.  Cardiovascular:     Rate and Rhythm: Normal rate and regular rhythm.     Pulses: Normal pulses.     Heart sounds: Normal heart sounds. No murmur heard.    No friction rub. No gallop.  Pulmonary:     Effort: Pulmonary effort is normal. No respiratory distress.     Breath sounds: No stridor. Wheezing present. No rhonchi or  rales.  Chest:     Chest wall: No tenderness.  Musculoskeletal:        General: Normal range of motion.     Cervical back: Normal range of motion and neck supple.  Skin:    General: Skin is warm and dry.     Capillary Refill: Capillary refill takes less than 2 seconds.     Coloration: Skin is not jaundiced or pale.     Findings: No bruising, erythema, lesion or rash.  Neurological:     General: No focal deficit present.     Mental Status: She is alert and oriented to person, place, and time. Mental status is at baseline.  Psychiatric:        Mood and Affect: Mood normal.        Behavior: Behavior normal.        Thought Content: Thought content normal.        Judgment: Judgment normal.     Results for orders placed or performed in  visit on 06/06/22  Bayer DCA Hb A1c Waived  Result Value Ref Range   HB A1C (BAYER DCA - WAIVED) 7.0 (H) 4.8 - 5.6 %      Assessment & Plan:   Problem List Items Addressed This Visit   None Visit Diagnoses     Upper respiratory tract infection, unspecified type    -  Primary   Will treat with prednisone, tussionex and tessalon. Call with any concerns or if getting worse. Continue to monitor.   Relevant Medications   triamcinolone acetonide (KENALOG-40) injection 40 mg (Completed) (Start on 08/31/2022 10:45 AM)   Acute cough       Flu and strep negative. Await COVID. Call with any concerns.   Relevant Orders   Veritor Flu A/B Waived   Novel Coronavirus, NAA ([facility])   Rapid Strep screen([facility])        Follow up plan: Return in about 2 weeks (around 09/14/2022).

## 2022-09-01 LAB — NOVEL CORONAVIRUS, NAA: SARS-CoV-2, NAA: NOT DETECTED

## 2022-09-02 ENCOUNTER — Other Ambulatory Visit: Payer: Self-pay | Admitting: Family Medicine

## 2022-09-02 NOTE — Telephone Encounter (Signed)
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

## 2022-09-03 LAB — VERITOR FLU A/B WAIVED
Influenza A: NEGATIVE
Influenza B: NEGATIVE

## 2022-09-03 LAB — CULTURE, GROUP A STREP

## 2022-09-03 LAB — RAPID STREP SCREEN (MED CTR MEBANE ONLY): Strep Gp A Ag, IA W/Reflex: NEGATIVE

## 2022-09-04 ENCOUNTER — Other Ambulatory Visit: Payer: Self-pay | Admitting: Family Medicine

## 2022-09-04 MED ORDER — AMOXICILLIN 500 MG PO CAPS: 500.0000 mg | ORAL_CAPSULE | Freq: Two times a day (BID) | ORAL | 0 refills | Status: AC

## 2022-09-06 ENCOUNTER — Ambulatory Visit: Payer: BC Managed Care – PPO | Admitting: Family Medicine

## 2022-09-09 ENCOUNTER — Other Ambulatory Visit: Payer: Self-pay | Admitting: Family Medicine

## 2022-09-09 NOTE — Telephone Encounter (Signed)
Requested medication (s) are due for refill today -expired Rx  Requested medication (s) are on the active medication list -yes  Future visit scheduled -yes  Last refill: 08/23/21 #[id number]  Notes to clinic: non delegated Rx   Requested Prescriptions  Pending Prescriptions Disp Refills   cyclobenzaprine (FLEXERIL) 10 MG tablet [Pharmacy Med Name: CYCLOBENZAPRINE '10MG'$  TABLETS] 60 tablet 6    Sig: TAKE 1 TABLET(10 MG) BY MOUTH THREE TIMES DAILY AS NEEDED FOR MUSCLE SPASMS     Not Delegated - Analgesics:  Muscle Relaxants Failed - 09/09/2022  8:00 AM      Failed - This refill cannot be delegated      Passed - Valid encounter within last 6 months    Recent Outpatient Visits           1 week ago Upper respiratory tract infection, unspecified type   [facility], Megan P, DO   3 months ago 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, without long-term current use of insulin ([place])   [place], Megan P, DO   6 months ago Routine general medical examination at a health care facility   [facility], [place] P, DO   8 months ago Eyelid dermatitis, allergic/contact   [facility] [place] [facility], Megan P, DO   10 months ago BV (bacterial vaginosis)   [place], [place], DO       Future Appointments             In 1 week [name], Megan P, DO [place], [place]               Requested Prescriptions  Pending Prescriptions Disp Refills   cyclobenzaprine (FLEXERIL) 10 MG tablet [Pharmacy Med Name: CYCLOBENZAPRINE '10MG'$  TABLETS] 60 tablet 6    Sig: TAKE 1 TABLET(10 MG) BY MOUTH THREE TIMES DAILY AS NEEDED FOR MUSCLE SPASMS     Not Delegated - Analgesics:  Muscle Relaxants Failed - 09/09/2022  8:00 AM      Failed - This refill cannot be delegated      Passed - Valid encounter within last 6 months    Recent Outpatient Visits           1 week ago Upper  respiratory tract infection, unspecified type   [facility] [place], Megan P, DO   3 months ago 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, without long-term current use of insulin ([place])   [place], Megan P, DO   6 months ago Routine general medical examination at a health care facility   [facility], [place] P, DO   8 months ago Eyelid dermatitis, allergic/contact   [facility] [place], Megan P, DO   10 months ago BV (bacterial vaginosis)   [place], Barb Merino, DO       Future Appointments             In 1 week Wynetta Emery, Barb Merino, DO [facility], [facility]

## 2022-09-15 ENCOUNTER — Ambulatory Visit: Payer: BC Managed Care – PPO | Admitting: Family Medicine

## 2022-09-16 ENCOUNTER — Ambulatory Visit: Payer: BC Managed Care – PPO | Admitting: Family Medicine

## 2022-09-16 ENCOUNTER — Encounter: Payer: Self-pay | Admitting: Family Medicine

## 2022-09-16 ENCOUNTER — Ambulatory Visit: Payer: Self-pay

## 2022-09-16 VITALS — BP 139/80 | HR 92 | Temp 98.2°F | Wt 244.7 lb

## 2022-09-16 DIAGNOSIS — I129 Hypertensive chronic kidney disease with stage 1 through stage 4 chronic kidney disease, or unspecified chronic kidney disease: Secondary | ICD-10-CM | POA: Diagnosis not present

## 2022-09-16 DIAGNOSIS — B3731 Acute candidiasis of vulva and vagina: Secondary | ICD-10-CM

## 2022-09-16 DIAGNOSIS — L719 Rosacea, unspecified: Secondary | ICD-10-CM | POA: Diagnosis not present

## 2022-09-16 DIAGNOSIS — E1122 Type 2 diabetes mellitus with diabetic chronic kidney disease: Secondary | ICD-10-CM

## 2022-09-16 DIAGNOSIS — D500 Iron deficiency anemia secondary to blood loss (chronic): Secondary | ICD-10-CM | POA: Diagnosis not present

## 2022-09-16 DIAGNOSIS — N181 Chronic kidney disease, stage 1: Secondary | ICD-10-CM

## 2022-09-16 LAB — BAYER DCA HB A1C WAIVED: HB A1C (BAYER DCA - WAIVED): 8.7 % — ABNORMAL HIGH (ref 4.8–5.6)

## 2022-09-16 MED ORDER — TIRZEPATIDE 2.5 MG/0.5ML ~~LOC~~ SOAJ: 2.5000 mg | SUBCUTANEOUS | 0 refills | Status: DC

## 2022-09-16 MED ORDER — PANTOPRAZOLE SODIUM 40 MG PO TBEC: 40.0000 mg | DELAYED_RELEASE_TABLET | Freq: Two times a day (BID) | ORAL | 1 refills | Status: DC

## 2022-09-16 MED ORDER — LISINOPRIL 5 MG PO TABS: 5.0000 mg | ORAL_TABLET | Freq: Every day | ORAL | 1 refills | Status: DC

## 2022-09-16 MED ORDER — NAPROXEN 500 MG PO TABS: ORAL_TABLET | ORAL | 1 refills | Status: DC

## 2022-09-16 MED ORDER — FLUCONAZOLE 150 MG PO TABS: 150.0000 mg | ORAL_TABLET | Freq: Every day | ORAL | 0 refills | Status: DC

## 2022-09-16 MED ORDER — MONTELUKAST SODIUM 10 MG PO TABS: ORAL_TABLET | ORAL | 1 refills | Status: DC

## 2022-09-16 MED ORDER — ALBUTEROL SULFATE HFA 108 (90 BASE) MCG/ACT IN AERS: 2.0000 | INHALATION_SPRAY | Freq: Four times a day (QID) | RESPIRATORY_TRACT | 6 refills | Status: DC | PRN

## 2022-09-16 MED ORDER — METOPROLOL SUCCINATE ER 25 MG PO TB24: ORAL_TABLET | ORAL | 1 refills | Status: DC

## 2022-09-16 MED ORDER — METFORMIN HCL 500 MG PO TABS: ORAL_TABLET | ORAL | 1 refills | Status: DC

## 2022-09-16 MED ORDER — LIDOCAINE VISCOUS HCL 2 % MT SOLN: 15.0000 mL | OROMUCOSAL | 1 refills | Status: DC | PRN

## 2022-09-16 NOTE — Progress Notes (Signed)
BP 139/80   Pulse 92   Temp 98.2 F (36.8 C) (Oral)   Wt 244 lb 11.2 oz (111 kg)   LMP 07/13/2020 (Approximate)   SpO2 97%   BMI 33.19 kg/m    Subjective:    Patient ID: [name], female    DOB: May 16, 1976, 46 y.o.   MRN: [id number]  HPI: [name] is a 46 y.o. female  Chief Complaint  Patient presents with   Hypertension   Diabetes   Anemia   DIABETES Hypoglycemic episodes:no Polydipsia/polyuria: no Visual disturbance: no Chest pain: no Paresthesias: no Glucose Monitoring: no  Accucheck frequency: Not Checking Taking Insulin?: no Blood Pressure Monitoring: not checking Retinal Examination: Not up to Date Foot Exam: Up to Date Diabetic Education: Not Completed Pneumovax: Up to Date Influenza: Up to Date Aspirin: no  HYPERTENSION  Hypertension status: controlled  Satisfied with current treatment? no Duration of hypertension: chronic BP monitoring frequency:  not checking BP medication side effects:  no Medication compliance: excellent compliance Previous BP meds:lisinopril, metoprolol Aspirin: no Recurrent headaches: no Visual changes: no Palpitations: no Dyspnea: no Chest pain: no Lower extremity edema: no Dizzy/lightheaded: no  ANEMIA Anemia status: stable Etiology of anemia: iron def Duration of anemia treatment: chronic  Compliance with treatment: excellent compliance Iron supplementation side effects: no Severity of anemia: severe Fatigue: yes Decreased exercise tolerance: no  Dyspnea on exertion: no Palpitations: no Bleeding: no Pica: no   Relevant past medical, surgical, family and social history reviewed and updated as indicated. Interim medical history since our last visit reviewed. Allergies and medications reviewed and updated.  Review of Systems  Constitutional: Negative.   HENT: Negative.    Respiratory: Negative.    Cardiovascular: Negative.   Gastrointestinal: Negative.    Musculoskeletal: Negative.   Psychiatric/Behavioral: Negative.      Per HPI unless specifically indicated above     Objective:    BP 139/80   Pulse 92   Temp 98.2 F (36.8 C) (Oral)   Wt 244 lb 11.2 oz (111 kg)   LMP 07/13/2020 (Approximate)   SpO2 97%   BMI 33.19 kg/m   Wt Readings from Last 3 Encounters:  09/16/22 244 lb 11.2 oz (111 kg)  08/31/22 244 lb 1.6 oz (110.7 kg)  06/06/22 248 lb (112.5 kg)    Physical Exam Vitals and nursing note reviewed.  Constitutional:      General: She is not in acute distress.    Appearance: Normal appearance. She is not ill-appearing, toxic-appearing or diaphoretic.  HENT:     Head: Normocephalic and atraumatic.     Right Ear: External ear normal.     Left Ear: External ear normal.     Nose: Nose normal.     Mouth/Throat:     Mouth: Mucous membranes are moist.     Pharynx: Oropharynx is clear.  Eyes:     General: No scleral icterus.       Right eye: No discharge.        Left eye: No discharge.     Extraocular Movements: Extraocular movements intact.     Conjunctiva/sclera: Conjunctivae normal.     Pupils: Pupils are equal, round, and reactive to light.  Cardiovascular:     Rate and Rhythm: Normal rate and regular rhythm.     Pulses: Normal pulses.     Heart sounds: Normal heart sounds. No murmur heard.    No friction rub. No gallop.  Pulmonary:     Effort: Pulmonary  effort is normal. No respiratory distress.     Breath sounds: Normal breath sounds. No stridor. No wheezing, rhonchi or rales.  Chest:     Chest wall: No tenderness.  Musculoskeletal:        General: Normal range of motion.     Cervical back: Normal range of motion and neck supple.  Skin:    General: Skin is warm and dry.     Capillary Refill: Capillary refill takes less than 2 seconds.     Coloration: Skin is not jaundiced or pale.     Findings: No bruising, erythema, lesion or rash.  Neurological:     General: No focal deficit present.     Mental  Status: She is alert and oriented to person, place, and time. Mental status is at baseline.  Psychiatric:        Mood and Affect: Mood normal.        Behavior: Behavior normal.        Thought Content: Thought content normal.        Judgment: Judgment normal.     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 Not able to tolerate ozempic. Will try mounjaro. Recheck 1 month.       Relevant Medications   lisinopril (ZESTRIL) 5 MG tablet   metFORMIN (GLUCOPHAGE) 500 MG tablet   tirzepatide (MOUNJARO) 2.5 MG/0.5ML Pen   Other Relevant Orders   Bayer DCA Hb A1c Waived (Completed)   Comprehensive metabolic panel   Lipid Panel w/o Chol/HDL Ratio     Musculoskeletal and Integument   Rosacea    Offered metrogel- she declined at this time. Will let us know if she changes her mind        Genitourinary   Benign hypertensive renal disease - Primary    Under good control on current regimen. Continue current regimen. Continue to monitor. Call with any concerns. Refills given. Labs drawn today.        Relevant Orders   Comprehensive metabolic panel     Other   Iron deficiency anemia due to chronic blood loss    Rechecking labs today. Await results. Treat as needed.       Relevant Orders   CBC with Differential/Platelet   Comprehensive metabolic panel   Other Visit Diagnoses     Vaginal yeast infection       Diflucan called in.   Relevant Medications   fluconazole (DIFLUCAN) 150 MG tablet        Follow up plan: Return in about 3 months (around 12/16/2022).

## 2022-09-16 NOTE — Telephone Encounter (Signed)
  Chief Complaint: Needs clarification on Rx Symptoms:  Frequency:  Pertinent Negatives: Patient denies  Disposition: '[]'$ ED /'[]'$ Urgent Care (no appt availability in office) / '[]'$ Appointment(In office/virtual)/ '[]'$  [place] [facility]/ '[]'$ Home Care/ '[]'$ Refused Recommended Disposition /'[]'$ [place] [facility]/ '[x]'$  Follow-up with PCP Additional Notes:      Summary: Rx clarification   Caitlyn with [facility] requests clarification of the Rx for lidocaine (XYLOCAINE) 2 % solution because there is no frequency listed. [phone]

## 2022-09-16 NOTE — Assessment & Plan Note (Signed)
Not under good control with A1c of 8.7. Not able to tolerate ozempic. Will try mounjaro. Recheck 1 month.

## 2022-09-16 NOTE — Assessment & Plan Note (Signed)
Offered metrogel- she declined at this time. Will let us know if she changes her mind

## 2022-09-16 NOTE — Assessment & Plan Note (Signed)
Under good control on current regimen. Continue current regimen. Continue to monitor. Call with any concerns. Refills given. Labs drawn today.   

## 2022-09-16 NOTE — Assessment & Plan Note (Signed)
Rechecking labs today. Await results. Treat as needed.  °

## 2022-09-17 LAB — COMPREHENSIVE METABOLIC PANEL
ALT: 33 IU/L — ABNORMAL HIGH (ref 0–32)
AST: 22 IU/L (ref 0–40)
Albumin/Globulin Ratio: 1.7 (ref 1.2–2.2)
Albumin: 4.1 g/dL (ref 3.9–4.9)
Alkaline Phosphatase: 74 IU/L (ref 44–121)
BUN/Creatinine Ratio: 16 (ref 9–23)
BUN: 12 mg/dL (ref 6–24)
Bilirubin Total: 0.2 mg/dL (ref 0.0–1.2)
CO2: 18 mmol/L — ABNORMAL LOW (ref 20–29)
Calcium: 9.4 mg/dL (ref 8.7–10.2)
Chloride: 98 mmol/L (ref 96–106)
Creatinine, Ser: 0.74 mg/dL (ref 0.57–1.00)
Globulin, Total: 2.4 g/dL (ref 1.5–4.5)
Glucose: 259 mg/dL — ABNORMAL HIGH (ref 70–99)
Potassium: 4.2 mmol/L (ref 3.5–5.2)
Sodium: 136 mmol/L (ref 134–144)
Total Protein: 6.5 g/dL (ref 6.0–8.5)
eGFR: 102 mL/min/{1.73_m2} (ref >59)

## 2022-09-17 LAB — CBC WITH DIFFERENTIAL/PLATELET
Basophils Absolute: 0.1 10*3/uL (ref 0.0–0.2)
Basos: 0 %
EOS (ABSOLUTE): 0.1 10*3/uL (ref 0.0–0.4)
Eos: 1 %
Hematocrit: 44.2 % (ref 34.0–46.6)
Hemoglobin: 14.4 g/dL (ref 11.1–15.9)
Immature Grans (Abs): 0 10*3/uL (ref 0.0–0.1)
Immature Granulocytes: 0 %
Lymphocytes Absolute: 3.7 10*3/uL — ABNORMAL HIGH (ref 0.7–3.1)
Lymphs: 32 %
MCH: 27.7 pg (ref 26.6–33.0)
MCHC: 32.6 g/dL (ref 31.5–35.7)
MCV: 85 fL (ref 79–97)
Monocytes Absolute: 0.8 10*3/uL (ref 0.1–0.9)
Monocytes: 7 %
Neutrophils Absolute: 7 10*3/uL (ref 1.4–7.0)
Neutrophils: 60 %
Platelets: 186 10*3/uL (ref 150–450)
RBC: 5.2 x10E6/uL (ref 3.77–5.28)
RDW: 13.4 % (ref 11.7–15.4)
WBC: 11.8 10*3/uL — ABNORMAL HIGH (ref 3.4–10.8)

## 2022-09-17 LAB — LIPID PANEL W/O CHOL/HDL RATIO
Cholesterol, Total: 169 mg/dL (ref 100–199)
HDL: 24 mg/dL — ABNORMAL LOW (ref >39)
LDL Chol Calc (NIH): 56 mg/dL (ref 0–99)
Triglycerides: 599 mg/dL (ref 0–149)
VLDL Cholesterol Cal: 89 mg/dL — ABNORMAL HIGH (ref 5–40)

## 2022-09-23 MED ORDER — LIDOCAINE VISCOUS HCL 2 % MT SOLN: 15.0000 mL | OROMUCOSAL | 1 refills | Status: DC | PRN

## 2022-09-23 NOTE — Addendum Note (Signed)
Addended by: Valerie Roys on: 09/23/2022 12:11 PM   Modules accepted: Orders

## 2022-09-28 ENCOUNTER — Encounter: Payer: Self-pay | Admitting: Family Medicine

## 2022-10-07 ENCOUNTER — Other Ambulatory Visit: Payer: Self-pay | Admitting: Family Medicine

## 2022-10-07 ENCOUNTER — Other Ambulatory Visit: Payer: Self-pay

## 2022-10-07 ENCOUNTER — Encounter: Payer: Self-pay | Admitting: Internal Medicine

## 2022-10-07 MED ORDER — TIRZEPATIDE 5 MG/0.5ML ~~LOC~~ SOAJ
5.0000 mg | SUBCUTANEOUS | 1 refills | Status: DC
  Filled 2022-10-07: qty 2, 28d supply, fill #0
  Filled 2022-10-14: qty 6, 84d supply, fill #0

## 2022-10-13 ENCOUNTER — Other Ambulatory Visit: Payer: Self-pay | Admitting: Family Medicine

## 2022-10-14 ENCOUNTER — Other Ambulatory Visit: Payer: Self-pay

## 2022-10-14 NOTE — Telephone Encounter (Signed)
[facility] called and spoke to [place], [facility] about the refill(s) metoprolol requested. Advised it was sent on 09/16/22 #90/1 refill(s). She says they have it on file. Will refuse this request.   Requested Prescriptions  Pending Prescriptions Disp Refills   metoprolol succinate (TOPROL-XL) 25 MG 24 hr tablet [Pharmacy Med Name: METOPROLOL ER SUCCINATE '25MG'$  TABS] 90 tablet 1    Sig: TAKE 1/2 TO 1 TABLET(12.5 TO 25 MG) BY MOUTH DAILY     Cardiovascular:  Beta Blockers Passed - 10/13/2022  9:06 AM      Passed - Last BP in normal range    BP Readings from Last 1 Encounters:  09/16/22 139/80         Passed - Last Heart Rate in normal range    Pulse Readings from Last 1 Encounters:  09/16/22 92         Passed - Valid encounter within last 6 months    Recent Outpatient Visits           4 weeks ago Benign hypertensive renal disease   [facility] [place], Megan P, DO   1 month ago Upper respiratory tract infection, unspecified type   [facility], Megan P, DO   4 months ago 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

## 2022-10-18 ENCOUNTER — Encounter: Payer: Self-pay | Admitting: Family Medicine

## 2022-10-18 MED ORDER — TIRZEPATIDE 5 MG/0.5ML ~~LOC~~ SOAJ: 5.0000 mg | SUBCUTANEOUS | 1 refills | Status: DC

## 2022-11-06 ENCOUNTER — Other Ambulatory Visit: Payer: Self-pay | Admitting: Family Medicine

## 2022-11-07 NOTE — Telephone Encounter (Signed)
Requested medication (s) are due for refill today: yes, routing for review  Requested medication (s) are on the active medication list: yes  Last refill:  09/16/22  Future visit scheduled:yes  Notes to clinic:  Unable to refill per protocol, Rx expired. Medication was refilled for a short supply, should patient continue to take? Routing for review.     Requested Prescriptions  Pending Prescriptions Disp Refills   fluconazole (DIFLUCAN) 150 MG tablet [Pharmacy Med Name: FLUCONAZOLE '150MG'$  TABLETS] 2 tablet 0    Sig: TAKE 1 TABLET(150 MG) BY MOUTH DAILY. MAY REPEAT AFTER 24 HOURS IF NOT BETTER     Off-Protocol Failed - 11/06/2022 11:17 AM      Failed - Medication not assigned to a protocol, review manually.      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

## 2022-11-18 ENCOUNTER — Other Ambulatory Visit: Payer: Self-pay | Admitting: Family Medicine

## 2022-11-18 NOTE — Telephone Encounter (Signed)
Requested Prescriptions  Pending Prescriptions Disp Refills   cetirizine (ZYRTEC) 10 MG tablet [Pharmacy Med Name: CETIRIZINE '10MG'$  TABLETS] 90 tablet 3    Sig: TAKE 1 TABLET(10 MG) BY MOUTH DAILY     Ear, Nose, and Throat:  Antihistamines 2 Passed - 11/18/2022  6:46 AM      Passed - Cr in normal range and within 360 days    Creatinine  Date Value Ref Range Status  04/09/2014 0.89 0.60 - 1.30 mg/dL Final   Creatinine, Ser  Date Value Ref Range Status  09/16/2022 0.74 0.57 - 1.00 mg/dL Final         Passed - Valid encounter within last 12 months    Recent Outpatient Visits           2 months ago Benign hypertensive renal disease   [place], Megan P, DO   2 months ago Upper respiratory tract infection, unspecified type   [facility], [place] P, DO   5 months ago 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

## 2022-12-08 ENCOUNTER — Ambulatory Visit: Payer: BC Managed Care – PPO | Admitting: Family Medicine

## 2022-12-08 ENCOUNTER — Encounter: Payer: Self-pay | Admitting: Family Medicine

## 2022-12-08 VITALS — BP 131/83 | HR 98 | Temp 98.6°F | Wt 248.1 lb

## 2022-12-08 DIAGNOSIS — Z20828 Contact with and (suspected) exposure to other viral communicable diseases: Secondary | ICD-10-CM | POA: Diagnosis not present

## 2022-12-08 DIAGNOSIS — Z20822 Contact with and (suspected) exposure to covid-19: Secondary | ICD-10-CM

## 2022-12-08 DIAGNOSIS — R252 Cramp and spasm: Secondary | ICD-10-CM

## 2022-12-08 DIAGNOSIS — J069 Acute upper respiratory infection, unspecified: Secondary | ICD-10-CM | POA: Diagnosis not present

## 2022-12-08 DIAGNOSIS — R509 Fever, unspecified: Secondary | ICD-10-CM | POA: Diagnosis not present

## 2022-12-08 LAB — VERITOR FLU A/B WAIVED
Influenza A: NEGATIVE
Influenza B: NEGATIVE

## 2022-12-08 MED ORDER — PREDNISONE 10 MG PO TABS: ORAL_TABLET | ORAL | 0 refills | Status: DC

## 2022-12-08 NOTE — Assessment & Plan Note (Signed)
Acute, stable. Rapid flu negative in office today. Recommend avoidance of exposures from work and appropriate precautions around sick exposures.

## 2022-12-08 NOTE — Progress Notes (Signed)
BP 131/83   Pulse 98   Temp 98.6 F (37 C) (Oral)   Wt 248 lb 1.6 oz (112.5 kg)   LMP 07/13/2020 (Approximate)   SpO2 96%   BMI 33.65 kg/m    Subjective:    Patient ID: [name], female    DOB: [birth year], 47 y.o.   MRN: [id number]  HPI: [name] is a 47 y.o. 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 Patient says she first became symptoms about a week ago. Patient declines taking any medication over the counter. Patient says last had a fever this morning went down after going back to sleep.    Sore Throat   UPPER RESPIRATORY TRACT INFECTION Duration: 3 days Worst symptom: fatigue and coughing Fever: yes Cough: yes Shortness of breath: yes Wheezing: yes Chest pain: no Chest tightness: yes Chest congestion: yes Nasal congestion: yes Runny nose: yes Post nasal drip: yes Sneezing: yes Sore throat: yes Swollen glands: yes Sinus pressure: yes Headache: yes Face pain: yes Toothache: no Ear pain: no  Ear pressure: no  Eyes red/itching:no Eye drainage/crusting: no  Vomiting: no Rash: no Fatigue: yes Sick contacts: yes Strep contacts: yes  Context: worse Recurrent sinusitis: no Relief with OTC cold/cough medications: no  Treatments attempted: none   Relevant past medical, surgical, family and social history reviewed and updated as indicated. Interim medical history since our last visit reviewed. Allergies and medications reviewed and updated.  Review of Systems  Constitutional: Negative.   HENT:  Positive for congestion, postnasal drip, rhinorrhea and sore throat. Negative for dental problem, drooling, ear discharge, ear pain, facial swelling, hearing loss, mouth sores, nosebleeds, sinus pressure, sinus pain, sneezing, tinnitus, trouble swallowing and voice change.   Respiratory:  Positive for cough, shortness of  breath and wheezing. Negative for apnea, choking, chest tightness and stridor.   Cardiovascular: Negative.   Gastrointestinal: Negative.   Musculoskeletal: Negative.   Psychiatric/Behavioral: Negative.      Per HPI unless specifically indicated above     Objective:    BP 131/83   Pulse 98   Temp 98.6 F (37 C) (Oral)   Wt 248 lb 1.6 oz (112.5 kg)   LMP 07/13/2020 (Approximate)   SpO2 96%   BMI 33.65 kg/m   Wt Readings from Last 3 Encounters:  12/08/22 248 lb 1.6 oz (112.5 kg)  09/16/22 244 lb 11.2 oz (111 kg)  08/31/22 244 lb 1.6 oz (110.7 kg)    Physical Exam Vitals and nursing note reviewed.  Constitutional:      General: She is not in acute distress.    Appearance: Normal appearance. She is well-developed. She is obese. She is ill-appearing. She is not toxic-appearing or diaphoretic.  HENT:     Head: Normocephalic and atraumatic.     Right Ear: Tympanic membrane, ear canal and external ear normal. No drainage, swelling or tenderness. No middle ear effusion. Tympanic membrane is not erythematous.     Left Ear: Tympanic membrane, ear canal and external ear normal. No drainage, swelling or tenderness.  No middle ear effusion. Tympanic membrane is not erythematous.     Nose: Rhinorrhea present. No congestion.     Mouth/Throat:     Mouth: Mucous membranes are moist. No oral lesions.     Pharynx: Oropharynx is clear. No pharyngeal swelling, oropharyngeal exudate, posterior oropharyngeal erythema or  uvula swelling.  Eyes:     General: No scleral icterus.       Right eye: No discharge.        Left eye: No discharge.     Extraocular Movements: Extraocular movements intact.     Conjunctiva/sclera: Conjunctivae normal.     Pupils: Pupils are equal, round, and reactive to light.  Cardiovascular:     Rate and Rhythm: Normal rate and regular rhythm.     Pulses: Normal pulses.     Heart sounds: Normal heart sounds. No murmur heard.    No friction rub. No gallop.  Pulmonary:      Effort: Pulmonary effort is normal. No respiratory distress.     Breath sounds: Normal breath sounds. No stridor. No wheezing, rhonchi or rales.  Chest:     Chest wall: No tenderness.  Musculoskeletal:        General: Normal range of motion.     Cervical back: Normal range of motion and neck supple.  Skin:    General: Skin is warm and dry.     Capillary Refill: Capillary refill takes less than 2 seconds.     Coloration: Skin is not jaundiced or pale.     Findings: No bruising, erythema, lesion or rash.  Neurological:     General: No focal deficit present.     Mental Status: She is alert and oriented to person, place, and time. Mental status is at baseline.  Psychiatric:        Mood and Affect: Mood normal.        Behavior: Behavior normal.        Thought Content: Thought content normal.        Judgment: Judgment normal.     Results for orders placed or performed in visit on 12/08/22  COVID-19, Flu A+B and RSV   Specimen: Nasopharyngeal(NP) swabs in vial transport medium  Result Value Ref Range   SARS-CoV-2, NAA Not Detected Not Detected   Influenza A, NAA Not Detected Not Detected   Influenza B, NAA Not Detected Not Detected   RSV, NAA Not Detected Not Detected   Test Information: Comment   Rapid Strep Screen ([facility] ONLY)   Specimen: Other   Other  Result Value Ref Range   Strep Gp A Ag, IA W/Reflex Negative Negative  Culture, Group A Strep   Other  Result Value Ref Range   Strep A Culture WILL FOLLOW   Basic metabolic panel  Result Value Ref Range   Glucose 292 (H) 70 - 99 mg/dL   BUN 17 6 - 24 mg/dL   Creatinine, Ser 0.79 0.57 - 1.00 mg/dL   eGFR 94 >59 mL/min/1.73   BUN/Creatinine Ratio 22 9 - 23   Sodium 137 134 - 144 mmol/L   Potassium 4.4 3.5 - 5.2 mmol/L   Chloride 101 96 - 106 mmol/L   CO2 19 (L) 20 - 29 mmol/L   Calcium 9.6 8.7 - 10.2 mg/dL  Magnesium  Result Value Ref Range   Magnesium 2.0 1.6 - 2.3 mg/dL  Phosphorus  Result Value Ref Range    Phosphorus 3.1 3.0 - 4.3 mg/dL  Veritor Flu A/B Waived  Result Value Ref Range   Influenza A Negative Negative   Influenza B Negative Negative      Assessment & Plan:   Problem List Items Addressed This Visit       Respiratory   Upper respiratory tract infection - Primary    Flu and strep  negative. Await COVID and RSV. Will treat with prednisone taper. Follow up as scheduled. Call with any concerns.       Relevant Medications   predniSONE (DELTASONE) 10 MG tablet   Other Relevant Orders   Culture, Group A Strep (Completed)     Other   Muscle cramps    Will check labs to look for possible cause. Encouraged hydration. Await results. Treat as needed.       Relevant Orders   Basic metabolic panel (Completed)   Magnesium (Completed)   Phosphorus (Completed)   RESOLVED: Fever    Acute, stable. Obtained strep, COVID, flu, and RSV in office. Strep negative. Start Prednisone '60mg'$  taper for 6 days. Return in 1 week.       Relevant Orders   COVID-19, Flu A+B and RSV (Completed)   Rapid Strep Screen ([facility] ONLY) (Completed)   RESOLVED: Exposure to COVID-19 virus    Acute, stable. COVID done today, awaiting results. Recommend avoidance of exposures from work and appropriate precautions around sick exposures.        RESOLVED: Exposure to influenza    Acute, stable. Rapid flu negative in office today. Recommend avoidance of exposures from work and appropriate precautions around sick exposures.        Relevant Orders   Veritor Flu A/B Waived (Completed)     Follow up plan: Return in 1 week (on 12/15/2022), or if symptoms worsen or fail to improve.

## 2022-12-08 NOTE — Progress Notes (Signed)
BP 131/83   Pulse 98   Temp 98.6 F (37 C) (Oral)   Wt 248 lb 1.6 oz (112.5 kg)   LMP 07/13/2020 (Approximate)   SpO2 96%   BMI 33.65 kg/m    Subjective:    Patient ID: [name], female    DOB: 1976-02-09, 47 y.o.   MRN: [id number]  HPI: [name] is a 47 y.o. 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 Patient says she first became symptoms about a week ago. Patient declines taking any medication over the counter. Patient says last had a fever this morning went down after going back to sleep.    Sore Throat   UPPER RESPIRATORY TRACT INFECTION Started 3 days ago  Worst symptom: Fatigue and coughing Fever: yes; 101 F Cough: yes; productive with green/dark brown phlegm.  Shortness of breath: no Wheezing: yes Chest pain: no Chest tightness: yes Chest congestion: yes Nasal congestion: yes Runny nose: yes Post nasal drip: yes; in the morning Sneezing: yes Sore throat: yes Swollen glands: yes Sinus pressure: yes Headache: yes Face pain: yes Toothache: no Ear pain:  No   Ear pressure: no Eyes red/itching:no Eye drainage/crusting: no  Vomiting: no Rash: no Fatigue: yes Muscle pain/aches: yes Sick contacts:  Students have been sick; positive for flu, strep, and Covid Strep contacts: yes  Context: worse Recurrent sinusitis: no Relief with OTC cold/cough medications:  Has not tried anything.    Treatments attempted: Lots of water.   Muscle Cramps Patient notices her right hand will start to cramp up when she has it in use for a long extended period of time.    Relevant past medical, surgical, family and social history reviewed and updated as indicated. Interim medical history since our last visit reviewed. Allergies and medications reviewed and updated.  Review of Systems  Constitutional:  Positive for fatigue and fever.  Negative for chills.  HENT:  Positive for congestion, postnasal drip, rhinorrhea, sinus pressure, sinus pain, sneezing and sore throat.   Eyes:  Positive for discharge. Negative for pain and itching.  Respiratory:  Positive for cough, chest tightness and wheezing. Negative for apnea and shortness of breath.   Musculoskeletal:  Positive for myalgias.    Per HPI unless specifically indicated above     Objective:    BP 131/83   Pulse 98   Temp 98.6 F (37 C) (Oral)   Wt 248 lb 1.6 oz (112.5 kg)   LMP 07/13/2020 (Approximate)   SpO2 96%   BMI 33.65 kg/m   Wt Readings from Last 3 Encounters:  12/08/22 248 lb 1.6 oz (112.5 kg)  09/16/22 244 lb 11.2 oz (111 kg)  08/31/22 244 lb 1.6 oz (110.7 kg)    Physical Exam Vitals and nursing note reviewed.  Constitutional:      General: She is not in acute distress.    Appearance: Normal appearance. She is not ill-appearing, toxic-appearing or diaphoretic.  HENT:     Head: Normocephalic and atraumatic.     Right Ear: Tympanic membrane, ear canal and external ear normal. There is no impacted cerumen.     Left Ear: Tympanic membrane, ear canal and external ear normal. There is no impacted cerumen.     Nose: Congestion and rhinorrhea present.     Mouth/Throat:     Mouth: Mucous membranes are moist.  Pharynx: Pharyngeal swelling and posterior oropharyngeal erythema present. No oropharyngeal exudate.  Eyes:     General: No scleral icterus.       Right eye: No discharge.        Left eye: No discharge.     Extraocular Movements: Extraocular movements intact.     Conjunctiva/sclera: Conjunctivae normal.     Pupils: Pupils are equal, round, and reactive to light.  Cardiovascular:     Rate and Rhythm: Normal rate and regular rhythm.     Pulses: Normal pulses.     Heart sounds: Normal heart sounds. No murmur heard.    No friction rub. No gallop.  Pulmonary:     Effort: Pulmonary effort is normal. No respiratory distress.     Breath sounds:  No stridor. Wheezing present. No rhonchi or rales.     Comments: Inspiratory wheezing Chest:     Chest wall: No tenderness.  Musculoskeletal:        General: Normal range of motion.     Cervical back: Normal range of motion and neck supple. No rigidity. No muscular tenderness.  Lymphadenopathy:     Cervical: Cervical adenopathy present.  Skin:    General: Skin is warm and dry.     Capillary Refill: Capillary refill takes less than 2 seconds.     Coloration: Skin is not jaundiced or pale.     Findings: No bruising, erythema, lesion or rash.  Neurological:     General: No focal deficit present.     Mental Status: She is alert and oriented to person, place, and time. Mental status is at baseline.     Cranial Nerves: No cranial nerve deficit.     Sensory: No sensory deficit.     Motor: No weakness.     Coordination: Coordination normal.     Gait: Gait normal.     Deep Tendon Reflexes: Reflexes normal.  Psychiatric:        Mood and Affect: Mood normal.        Behavior: Behavior normal.        Thought Content: Thought content normal.        Judgment: Judgment normal.     Results for orders placed or performed in visit on 12/08/22  Rapid Strep Screen ([facility] ONLY)   Specimen: Other   Other  Result Value Ref Range   Strep Gp A Ag, IA W/Reflex Negative Negative  Culture, Group A Strep   Other  Result Value Ref Range   Strep A Culture WILL FOLLOW   Veritor Flu A/B Waived  Result Value Ref Range   Influenza A Negative Negative   Influenza B Negative Negative      Assessment & Plan:   Problem List Items Addressed This Visit       Respiratory   Upper respiratory tract infection - Primary    Acute, stable. Obtained strep, COVID, flu, and RSV in office. Strep negative. Start Prednisone '60mg'$  taper for 6 days. Offered albuterol nebulizer in office, patient declined and agreed to use of home albuterol nebulizer.       Relevant Medications   predniSONE (DELTASONE) 10 MG  tablet   Other Relevant Orders   Culture, Group A Strep (Completed)     Other   Fever    Acute, stable. Obtained strep, COVID, flu, and RSV in office. Strep negative. Start Prednisone '60mg'$  taper for 6 days. Return in 1 week.       Relevant Orders   COVID-19, Flu  A+B and RSV   Rapid Strep Screen ([facility] ONLY) (Completed)   Exposure to COVID-19 virus    Acute, stable. COVID done today, awaiting results. Recommend avoidance of exposures from work and appropriate precautions around sick exposures.        Exposure to influenza    Acute, stable. Rapid flu negative in office today. Recommend avoidance of exposures from work and appropriate precautions around sick exposures.        Relevant Orders   Veritor Flu A/B Waived (Completed)   Muscle cramps    Acute, stable. Mg, Phosphorus, and BMP done in office today. Recommend supplement as needed for prevention.       Relevant Orders   Basic metabolic panel   Magnesium   Phosphorus     Follow up plan: Return in 1 week (on 12/15/2022), or if symptoms worsen or fail to improve.

## 2022-12-08 NOTE — Assessment & Plan Note (Addendum)
Will check labs to look for possible cause. Encouraged hydration. Await results. Treat as needed.

## 2022-12-08 NOTE — Assessment & Plan Note (Signed)
Acute, stable. Obtained strep, COVID, flu, and RSV in office. Strep negative. Start Prednisone 35m taper for 6 days. Return in 1 week.

## 2022-12-08 NOTE — Assessment & Plan Note (Addendum)
Flu and strep negative. Await COVID and RSV. Will treat with prednisone taper. Follow up as scheduled. Call with any concerns.

## 2022-12-08 NOTE — Assessment & Plan Note (Signed)
Acute, stable. COVID done today, awaiting results. Recommend avoidance of exposures from work and appropriate precautions around sick exposures.

## 2022-12-10 ENCOUNTER — Encounter: Payer: Self-pay | Admitting: Family Medicine

## 2022-12-10 LAB — BASIC METABOLIC PANEL
BUN/Creatinine Ratio: 22 (ref 9–23)
BUN: 17 mg/dL (ref 6–24)
CO2: 19 mmol/L — ABNORMAL LOW (ref 20–29)
Calcium: 9.6 mg/dL (ref 8.7–10.2)
Chloride: 101 mmol/L (ref 96–106)
Creatinine, Ser: 0.79 mg/dL (ref 0.57–1.00)
Glucose: 292 mg/dL — ABNORMAL HIGH (ref 70–99)
Potassium: 4.4 mmol/L (ref 3.5–5.2)
Sodium: 137 mmol/L (ref 134–144)
eGFR: 94 mL/min/{1.73_m2} (ref >59)

## 2022-12-10 LAB — COVID-19, FLU A+B AND RSV
Influenza A, NAA: NOT DETECTED
Influenza B, NAA: NOT DETECTED
RSV, NAA: NOT DETECTED
SARS-CoV-2, NAA: NOT DETECTED

## 2022-12-10 LAB — PHOSPHORUS: Phosphorus: 3.1 mg/dL (ref 3.0–4.3)

## 2022-12-10 LAB — MAGNESIUM: Magnesium: 2 mg/dL (ref 1.6–2.3)

## 2022-12-12 LAB — CULTURE, GROUP A STREP: Strep A Culture: NEGATIVE

## 2022-12-12 LAB — RAPID STREP SCREEN (MED CTR MEBANE ONLY): Strep Gp A Ag, IA W/Reflex: NEGATIVE

## 2022-12-16 ENCOUNTER — Ambulatory Visit: Payer: BC Managed Care – PPO | Admitting: Family Medicine

## 2022-12-16 ENCOUNTER — Encounter: Payer: Self-pay | Admitting: Family Medicine

## 2022-12-16 VITALS — BP 125/82 | HR 89 | Temp 98.2°F | Wt 247.8 lb

## 2022-12-16 DIAGNOSIS — L309 Dermatitis, unspecified: Secondary | ICD-10-CM | POA: Diagnosis not present

## 2022-12-16 DIAGNOSIS — E1122 Type 2 diabetes mellitus with diabetic chronic kidney disease: Secondary | ICD-10-CM

## 2022-12-16 DIAGNOSIS — E781 Pure hyperglyceridemia: Secondary | ICD-10-CM | POA: Diagnosis not present

## 2022-12-16 DIAGNOSIS — N181 Chronic kidney disease, stage 1: Secondary | ICD-10-CM

## 2022-12-16 LAB — BAYER DCA HB A1C WAIVED: HB A1C (BAYER DCA - WAIVED): 8.5 % — ABNORMAL HIGH (ref 4.8–5.6)

## 2022-12-16 MED ORDER — RYBELSUS 7 MG PO TABS: 7.0000 mg | ORAL_TABLET | Freq: Every day | ORAL | 3 refills | Status: DC

## 2022-12-16 MED ORDER — RYBELSUS 3 MG PO TABS: 3.0000 mg | ORAL_TABLET | Freq: Every day | ORAL | 0 refills | Status: DC

## 2022-12-16 MED ORDER — TRIAMCINOLONE ACETONIDE 0.5 % EX OINT: 1.0000 | TOPICAL_OINTMENT | Freq: Two times a day (BID) | CUTANEOUS | 0 refills | Status: DC

## 2022-12-16 MED ORDER — GABAPENTIN 100 MG PO CAPS: 100.0000 mg | ORAL_CAPSULE | Freq: Every day | ORAL | 1 refills | Status: DC

## 2022-12-16 NOTE — Progress Notes (Signed)
BP 125/82   Pulse 89   Temp 98.2 F (36.8 C) (Oral)   Wt 247 lb 12.8 oz (112.4 kg)   LMP 07/13/2020 (Approximate)   SpO2 97%   BMI 33.61 kg/m    Subjective:    Patient ID: [name], female    DOB: 09/17/1976, 47 y.o.   MRN: [id number]  HPI: [name] is a 47 y.o. female  Chief Complaint  Patient presents with  . Diabetes    Patient declines having a recent Diabetic Eye Exam.    Had a headache today, doesn't usually get headaches. It's in the top of her head, whole brain is hurting. Has been sleeping and drinking enough water. Has not eaten today.   DIABETES Hypoglycemic episodes:{Blank single:19197::"yes","no"} Polydipsia/polyuria: {Blank single:19197::"yes","no"} Visual disturbance: {Blank single:19197::"yes","no"} Chest pain: {Blank single:19197::"yes","no"} Paresthesias: {Blank single:19197::"yes","no"} Glucose Monitoring: {Blank single:19197::"yes","no"}  Accucheck frequency: {Blank single:19197::"Not Checking","Daily","BID","TID"} Taking Insulin?: {Blank single:19197::"yes","no"} Blood Pressure Monitoring: {Blank single:19197::"not checking","rarely","daily","weekly","monthly","a few times a day","a few times a week","a few times a month"} Retinal Examination: {Blank single:19197::"Up to Date","Not up to Date"} Foot Exam: {Blank single:19197::"Up to Date","Not up to Date"} Diabetic Education: {Blank single:19197::"Completed","Not Completed"} Pneumovax: {Blank single:19197::"Up to Date","Not up to Date","unknown"} Influenza: {Blank single:19197::"Up to Date","Not up to Date","unknown"} Aspirin: {Blank single:19197::"yes","no"}   Relevant past medical, surgical, family and social history reviewed and updated as indicated. Interim medical history since our last visit reviewed. Allergies and medications reviewed and updated.  Review of Systems  Constitutional: Negative.   Respiratory: Negative.    Cardiovascular: Negative.    Musculoskeletal: Negative.   Skin: Negative.   Psychiatric/Behavioral: Negative.      Per HPI unless specifically indicated above     Objective:    BP 125/82   Pulse 89   Temp 98.2 F (36.8 C) (Oral)   Wt 247 lb 12.8 oz (112.4 kg)   LMP 07/13/2020 (Approximate)   SpO2 97%   BMI 33.61 kg/m   Wt Readings from Last 3 Encounters:  12/16/22 247 lb 12.8 oz (112.4 kg)  12/08/22 248 lb 1.6 oz (112.5 kg)  09/16/22 244 lb 11.2 oz (111 kg)    Physical Exam Vitals and nursing note reviewed.  Constitutional:      General: She is not in acute distress.    Appearance: Normal appearance. She is obese. She is not ill-appearing, toxic-appearing or diaphoretic.  HENT:     Head: Normocephalic and atraumatic.     Right Ear: External ear normal.     Left Ear: External ear normal.     Nose: Nose normal.     Mouth/Throat:     Mouth: Mucous membranes are moist.     Pharynx: Oropharynx is clear.  Eyes:     General: No scleral icterus.       Right eye: No discharge.        Left eye: No discharge.     Extraocular Movements: Extraocular movements intact.     Conjunctiva/sclera: Conjunctivae normal.     Pupils: Pupils are equal, round, and reactive to light.  Cardiovascular:     Rate and Rhythm: Normal rate and regular rhythm.     Pulses: Normal pulses.     Heart sounds: Normal heart sounds. No murmur heard.    No friction rub. No gallop.  Pulmonary:     Effort: Pulmonary effort is normal. No respiratory distress.     Breath sounds: Normal breath sounds. No stridor. No wheezing, rhonchi or rales.  Chest:  Chest wall: No tenderness.  Musculoskeletal:        General: Normal range of motion.     Cervical back: Normal range of motion and neck supple.  Skin:    General: Skin is warm and dry.     Capillary Refill: Capillary refill takes less than 2 seconds.     Coloration: Skin is not jaundiced or pale.     Findings: No bruising, erythema, lesion or rash.  Neurological:     General:  No focal deficit present.     Mental Status: She is alert and oriented to person, place, and time. Mental status is at baseline.  Psychiatric:        Mood and Affect: Mood normal.        Behavior: Behavior normal.        Thought Content: Thought content normal.        Judgment: Judgment normal.    Results for orders placed or performed in visit on 12/08/22  COVID-19, Flu A+B and RSV   Specimen: Nasopharyngeal(NP) swabs in vial transport medium  Result Value Ref Range   SARS-CoV-2, NAA Not Detected Not Detected   Influenza A, NAA Not Detected Not Detected   Influenza B, NAA Not Detected Not Detected   RSV, NAA Not Detected Not Detected   Test Information: Comment   Rapid Strep Screen ([facility] ONLY)   Specimen: Other   Other  Result Value Ref Range   Strep Gp A Ag, IA W/Reflex Negative Negative  Culture, Group A Strep   Other  Result Value Ref Range   Strep A Culture Negative   Basic metabolic panel  Result Value Ref Range   Glucose 292 (H) 70 - 99 mg/dL   BUN 17 6 - 24 mg/dL   Creatinine, Ser 0.79 0.57 - 1.00 mg/dL   eGFR 94 >59 mL/min/1.73   BUN/Creatinine Ratio 22 9 - 23   Sodium 137 134 - 144 mmol/L   Potassium 4.4 3.5 - 5.2 mmol/L   Chloride 101 96 - 106 mmol/L   CO2 19 (L) 20 - 29 mmol/L   Calcium 9.6 8.7 - 10.2 mg/dL  Magnesium  Result Value Ref Range   Magnesium 2.0 1.6 - 2.3 mg/dL  Phosphorus  Result Value Ref Range   Phosphorus 3.1 3.0 - 4.3 mg/dL  Veritor Flu A/B Waived  Result Value Ref Range   Influenza A Negative Negative   Influenza B Negative Negative      Assessment & Plan:   Problem List Items Addressed This Visit       Endocrine   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

## 2022-12-17 LAB — COMPREHENSIVE METABOLIC PANEL
ALT: 24 IU/L (ref 0–32)
AST: 14 IU/L (ref 0–40)
Albumin/Globulin Ratio: 2.3 — ABNORMAL HIGH (ref 1.2–2.2)
Albumin: 4.8 g/dL (ref 3.9–4.9)
Alkaline Phosphatase: 68 IU/L (ref 44–121)
BUN/Creatinine Ratio: 21 (ref 9–23)
BUN: 17 mg/dL (ref 6–24)
Bilirubin Total: 0.2 mg/dL (ref 0.0–1.2)
CO2: 21 mmol/L (ref 20–29)
Calcium: 9.9 mg/dL (ref 8.7–10.2)
Chloride: 99 mmol/L (ref 96–106)
Creatinine, Ser: 0.8 mg/dL (ref 0.57–1.00)
Globulin, Total: 2.1 g/dL (ref 1.5–4.5)
Glucose: 200 mg/dL — ABNORMAL HIGH (ref 70–99)
Potassium: 4.3 mmol/L (ref 3.5–5.2)
Sodium: 141 mmol/L (ref 134–144)
Total Protein: 6.9 g/dL (ref 6.0–8.5)
eGFR: 93 mL/min/{1.73_m2} (ref >59)

## 2022-12-17 LAB — LIPID PANEL W/O CHOL/HDL RATIO
Cholesterol, Total: 180 mg/dL (ref 100–199)
HDL: 40 mg/dL (ref >39)
LDL Chol Calc (NIH): 105 mg/dL — ABNORMAL HIGH (ref 0–99)
Triglycerides: 203 mg/dL — ABNORMAL HIGH (ref 0–149)
VLDL Cholesterol Cal: 35 mg/dL (ref 5–40)

## 2022-12-18 ENCOUNTER — Encounter: Payer: Self-pay | Admitting: Family Medicine

## 2022-12-18 DIAGNOSIS — E781 Pure hyperglyceridemia: Secondary | ICD-10-CM | POA: Insufficient documentation

## 2022-12-18 NOTE — Assessment & Plan Note (Signed)
Rechecking labs today. Await results. Call with any concerns.  

## 2022-12-18 NOTE — Assessment & Plan Note (Signed)
Unable to tolerate mounjaro. We will change to rybelsus. Recheck 3 months. Call with any concerns.

## 2023-01-21 ENCOUNTER — Other Ambulatory Visit: Payer: Self-pay | Admitting: Family Medicine

## 2023-01-23 NOTE — Telephone Encounter (Signed)
Requested medications are due for refill today.  unsure  Requested medications are on the active medications list.  yes  Last refill. 11/08/2022 #2 0 rf  Future visit scheduled.   yes  Notes to clinic.  No protocol assigned to medication.     Requested Prescriptions  Pending Prescriptions Disp Refills   fluconazole (DIFLUCAN) 150 MG tablet [Pharmacy Med Name: FLUCONAZOLE 150MG  TABLETS] 2 tablet 0    Sig: TAKE 1 TABLET(150 MG) BY MOUTH ONCE FOR 1 DOSE. MAY REPEAT DOSE AFTER 24 HOURS IF NOT BETTER     Off-Protocol Failed - 01/21/2023 10:34 AM      Failed - Medication not assigned to a protocol, review manually.      Passed - Valid encounter within last 12 months    Recent Outpatient Visits           1 month ago 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, without long-term current use of insulin (HCC)    [facility] [place], Megan P, DO   1 month ago Upper respiratory tract infection, unspecified type   [employer] [facility] [place], Megan P, DO   4 months ago Benign hypertensive renal disease   [place] [facility] [place], Megan P, DO   4 months ago Upper respiratory tract infection, unspecified type   [place] [facility], Megan P, DO   7 months ago 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, without long-term current use of insulin (HCC)   [place] [facility] [place], Oralia Rud, DO       Future Appointments             In 2 months Laural Benes, Oralia Rud, DO  [facility]

## 2023-01-28 ENCOUNTER — Other Ambulatory Visit: Payer: Self-pay | Admitting: Family Medicine

## 2023-01-30 NOTE — Telephone Encounter (Signed)
Requested medication (s) are due for refill today: yes  Requested medication (s) are on the active medication list: yes  Last refill:  01/27/22 #60/12  Future visit scheduled: yes  Notes to clinic:  Unable to refill per protocol, medication not assigned to the refill protocol.      Requested Prescriptions  Pending Prescriptions Disp Refills   TRELEGY ELLIPTA 100-62.5-25 MCG/ACT AEPB [Pharmacy Med Name: TRELEGY ELLIPTA 100-62. INH 30P] 60 each 12    Sig: INHALE 1 PUFF INTO THE LUNGS DAILY     Off-Protocol Failed - 01/28/2023  8:44 AM      Failed - Medication not assigned to a protocol, review manually.      Passed - Valid encounter within last 12 months    Recent Outpatient Visits           1 month ago 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, without long-term current use of insulin (HCC)   [place] [facility] [place], Megan P, DO   1 month ago Upper respiratory tract infection, unspecified type   [place] [place], Megan P, DO   4 months ago Benign hypertensive renal disease   [place] [facility] [place], Megan P, DO   5 months ago Upper respiratory tract infection, unspecified type   [place], Megan P, DO   7 months ago 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, without long-term current use of insulin (HCC)   [place] [facility] [place], Oralia Rud, DO       Future Appointments             In 1 month [name], Oralia Rud, DO [facility]

## 2023-03-12 ENCOUNTER — Other Ambulatory Visit: Payer: Self-pay | Admitting: Family Medicine

## 2023-03-13 ENCOUNTER — Other Ambulatory Visit: Payer: Self-pay | Admitting: Family Medicine

## 2023-03-14 NOTE — Telephone Encounter (Signed)
Requested medication (s) are due for refill today: routing for review  Requested medication (s) are on the active medication list: yes  Last refill:  01/24/23  Future visit scheduled: yes  Notes to clinic:   Medication not assigned to a protocol, review manuall      Requested Prescriptions  Pending Prescriptions Disp Refills   fluconazole (DIFLUCAN) 150 MG tablet [Pharmacy Med Name: FLUCONAZOLE 150MG  TABLETS] 2 tablet 0    Sig: TAKE 1 TABLET(150 MG) BY MOUTH 1 TIME FOR 1 DOSE. MAY REPEAT DOSE AFTER 24 HOURS IF NOT BETTER     Off-Protocol Failed - 03/12/2023  7:51 AM      Failed - Medication not assigned to a protocol, review manually.      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, without long-term current use of insulin (HCC)   [place] [facility] [place], Megan P, DO   3 months ago Upper respiratory tract infection, unspecified type   [place] [facility] [place], Megan P, DO   5 months ago Benign hypertensive renal disease   [place] [facility] [place], Megan P, DO   6 months ago Upper respiratory tract infection, unspecified type   [place] [facility] [place], Megan P, DO   9 months ago 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, without long-term current use of insulin (HCC)   [place] [facility] [place], Oralia Rud, DO       Future Appointments             In 1 week Laural Benes, Oralia Rud, DO [facility]

## 2023-03-14 NOTE — Telephone Encounter (Signed)
Requested Prescriptions  Pending Prescriptions Disp Refills   metFORMIN (GLUCOPHAGE) 500 MG tablet [Pharmacy Med Name: METFORMIN 500MG  TABLETS] 180 tablet 0    Sig: TAKE 1 TABLET(500 MG) BY MOUTH TWICE DAILY WITH A MEAL     Endocrinology:  Diabetes - Biguanides Failed - 03/13/2023  8:32 AM      Failed - HBA1C is between 0 and 7.9 and within 180 days    HB A1C (BAYER DCA - WAIVED)  Date Value Ref Range Status  12/16/2022 8.5 (H) 4.8 - 5.6 % Final    Comment:             Prediabetes: 5.7 - 6.4          Diabetes: >6.4          Glycemic control for adults with diabetes: <7.0          Failed - B12 Level in normal range and within 720 days    Vitamin B-12  Date Value Ref Range Status  03/20/2020 >2000 (H) 232 - 1245 pg/mL Final         Passed - Cr in normal range and within 360 days    Creatinine  Date Value Ref Range Status  04/09/2014 0.89 0.60 - 1.30 mg/dL Final   Creatinine, Ser  Date Value Ref Range Status  12/16/2022 0.80 0.57 - 1.00 mg/dL Final         Passed - eGFR in normal range and within 360 days    EGFR (African American)  Date Value Ref Range Status  04/09/2014 >60  Final   GFR calc Af Amer  Date Value Ref Range Status  11/26/2020 104 >59 mL/min/1.73 Final    Comment:    **In accordance with recommendations from the NKF-ASN Task force,**   [facility] is in the process of updating its eGFR calculation to the   2021 CKD-EPI creatinine equation that estimates kidney function   without a race variable.    EGFR (Non-African Amer.)  Date Value Ref Range Status  04/09/2014 >60  Final    Comment:    eGFR values <61mL/min/1.73 m2 may be an indication of chronic kidney disease (CKD). Calculated eGFR is useful in patients with stable renal function. The eGFR calculation will not be reliable in acutely ill patients when serum creatinine is changing rapidly. It is not useful in  patients on dialysis. The eGFR calculation may not be applicable to patients at the low  and high extremes of body sizes, pregnant women, and vegetarians.    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, without long-term current use of insulin (HCC)   [place] [facility] [place], Megan P, DO   3 months ago Upper respiratory tract infection, unspecified type   [place] [facility] [place], Megan P, DO   5 months ago Benign hypertensive renal disease   [place] [facility] [place], Megan P, DO   6 months ago Upper respiratory tract infection, unspecified type   [facility] [facility] [name], Megan P, DO   9 months ago 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, without long-term current use of insulin (HCC)   [place] [facility] [place], [place], DO       Future Appointments             In 1 week Laural Benes, Oralia Rud, DO [place] [facility]            Passed - CBC within normal limits and completed in the last 12 months    WBC  Date Value Ref Range Status  09/16/2022 11.8 (H) 3.4 - 10.8 x10E3/uL Final  10/31/2021 9.8 4.0 - 10.5 K/uL Final   RBC  Date Value Ref Range Status  09/16/2022 5.20 3.77 - 5.28 x10E6/uL Final  10/31/2021 5.30 (H) 3.87 - 5.11 MIL/uL Final   Hemoglobin  Date Value Ref Range Status  09/16/2022 14.4 11.1 - 15.9 g/dL Final   Hematocrit  Date Value Ref Range Status  09/16/2022 44.2 34.0 - 46.6 % Final   MCHC  Date Value Ref Range Status  09/16/2022 32.6 31.5 - 35.7 g/dL Final  16/07/9603 54.0 30.0 - 36.0 g/dL Final   [facility]  Date Value Ref  Range Status  09/16/2022 27.7 26.6 - 33.0 pg Final  10/31/2021 27.7 26.0 - 34.0 pg Final   MCV  Date Value Ref Range Status  09/16/2022 85 79 - 97 fL Final  04/09/2014 77 (L) 80 - 100 fL Final   No results found for: "PLTCOUNTKUC", "LABPLAT", "POCPLA" RDW  Date Value Ref Range Status  09/16/2022 13.4 11.7 - 15.4 % Final  04/09/2014 15.5 (H) 11.5 - 14.5 % Final          montelukast (SINGULAIR) 10 MG tablet [Pharmacy Med Name: MONTELUKAST 10MG  TABLETS] 90 tablet 0    Sig: TAKE 1 TABLET(10 MG) BY MOUTH AT BEDTIME     Pulmonology:  Leukotriene Inhibitors Passed - 03/13/2023  8:32 AM      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, without long-term current use of insulin (HCC)   [place] [facility] [place], Megan P, DO   3 months ago Upper respiratory tract infection, unspecified type   [facility] [place], Megan P, DO   5 months ago Benign hypertensive renal disease   [place] [facility], Megan P, DO   6 months ago Upper respiratory tract infection, unspecified type   [place] [facility] [place], Megan P, DO   9 months ago 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, without long-term current use of insulin (HCC)   [place], Oralia Rud, DO       Future Appointments             In 1 week Dorcas Carrow, DO [facility]

## 2023-03-27 ENCOUNTER — Ambulatory Visit: Payer: BC Managed Care – PPO | Admitting: Family Medicine

## 2023-04-06 ENCOUNTER — Ambulatory Visit: Payer: BC Managed Care – PPO | Admitting: Family Medicine

## 2023-04-06 VITALS — BP 132/79 | HR 89 | Temp 98.0°F | Wt 241.0 lb

## 2023-04-06 DIAGNOSIS — Z1231 Encounter for screening mammogram for malignant neoplasm of breast: Secondary | ICD-10-CM

## 2023-04-06 DIAGNOSIS — Z7985 Long-term (current) use of injectable non-insulin antidiabetic drugs: Secondary | ICD-10-CM

## 2023-04-06 DIAGNOSIS — E1122 Type 2 diabetes mellitus with diabetic chronic kidney disease: Secondary | ICD-10-CM

## 2023-04-06 DIAGNOSIS — N189 Chronic kidney disease, unspecified: Secondary | ICD-10-CM

## 2023-04-06 DIAGNOSIS — N760 Acute vaginitis: Secondary | ICD-10-CM | POA: Diagnosis not present

## 2023-04-06 DIAGNOSIS — R2232 Localized swelling, mass and lump, left upper limb: Secondary | ICD-10-CM | POA: Diagnosis not present

## 2023-04-06 DIAGNOSIS — B9689 Other specified bacterial agents as the cause of diseases classified elsewhere: Secondary | ICD-10-CM

## 2023-04-06 DIAGNOSIS — Z7984 Long term (current) use of oral hypoglycemic drugs: Secondary | ICD-10-CM

## 2023-04-06 DIAGNOSIS — N898 Other specified noninflammatory disorders of vagina: Secondary | ICD-10-CM

## 2023-04-06 LAB — MICROALBUMIN, URINE WAIVED
Creatinine, Urine Waived: 50 mg/dL (ref 10–300)
Microalb, Ur Waived: 30 mg/L — ABNORMAL HIGH (ref 0–19)

## 2023-04-06 LAB — BAYER DCA HB A1C WAIVED: HB A1C (BAYER DCA - WAIVED): 8.4 % — ABNORMAL HIGH (ref 4.8–5.6)

## 2023-04-06 LAB — WET PREP FOR TRICH, YEAST, CLUE
Clue Cell Exam: POSITIVE — AB
Trichomonas Exam: NEGATIVE
Yeast Exam: NEGATIVE

## 2023-04-06 MED ORDER — RYBELSUS 14 MG PO TABS: 14.0000 mg | ORAL_TABLET | Freq: Every day | ORAL | 1 refills | Status: DC

## 2023-04-06 MED ORDER — METRONIDAZOLE 500 MG PO TABS: 500.0000 mg | ORAL_TABLET | Freq: Two times a day (BID) | ORAL | 0 refills | Status: DC

## 2023-04-06 NOTE — Assessment & Plan Note (Signed)
Improved slightly with A1c of 8.4 down from 8.5. Will increase her rybelsus to 14mg  and recheck in 3 months. Call with any concerns.

## 2023-04-06 NOTE — Progress Notes (Signed)
BP 132/79   Pulse 89   Temp 98 F (36.7 C) (Oral)   Wt 241 lb (109.3 kg)   LMP 07/13/2020 (Approximate)   SpO2 97%   BMI 32.69 kg/m    Subjective:    Patient ID: [name], female    DOB: 08-29-1976, 47 y.o.   MRN: [id number]  HPI: [name] is a 46 y.o. female  Chief Complaint  Patient presents with   Diabetes    Patient declines having a Diabetic Eye Exam at today's visit.    Cyst   Vaginitis    Patient says she thinks she may have a yeast infection from the swimming pool. Patient would like to discussing with provider.    Vaginal Pain   Vaginal Itching   DIABETES Hypoglycemic episodes:no Polydipsia/polyuria: no Visual disturbance: no Chest pain: no Paresthesias: no Glucose Monitoring: no  Accucheck frequency: occasionally Taking Insulin?: no Blood Pressure Monitoring: not checking Retinal Examination: Not up to Date Foot Exam: Up to Date Diabetic Education: Completed Pneumovax: Not up to Date Influenza: Up to Date Aspirin: no  VAGINAL DISCHARGE Duration: couple of weeks Discharge description: white  Pruritus: yes Dysuria: no Malodorous: yes Urinary frequency: no Fevers: no Abdominal pain: yes  Sexual activity: monogamous History of sexually transmitted diseases: no Recent antibiotic use: no Context: recurrent BV  Treatments attempted: antifungal  LUMP Duration: couple of weeks Location: L axilla Onset: gradual Painful: no Discomfort: yes Status:  bigger Trauma: no Redness: no Bruising: no Recent infection: no Swollen lymph nodes: no Requesting removal: no History of cancer: no Family history of cancer: no History of the same: no  Relevant past medical, surgical, family and social history reviewed and updated as indicated. Interim medical history since our last visit reviewed. Allergies and medications reviewed and updated.  Review of Systems  Constitutional: Negative.   Respiratory: Negative.     Cardiovascular: Negative.   Gastrointestinal: Negative.   Genitourinary:  Positive for vaginal discharge. Negative for decreased urine volume, difficulty urinating, dyspareunia, dysuria, enuresis, flank pain, frequency, genital sores, hematuria, menstrual problem, pelvic pain, urgency, vaginal bleeding and vaginal pain.  Musculoskeletal: Negative.   Neurological: Negative.   Psychiatric/Behavioral: Negative.      Per HPI unless specifically indicated above     Objective:    BP 132/79   Pulse 89   Temp 98 F (36.7 C) (Oral)   Wt 241 lb (109.3 kg)   LMP 07/13/2020 (Approximate)   SpO2 97%   BMI 32.69 kg/m   Wt Readings from Last 3 Encounters:  04/06/23 241 lb (109.3 kg)  12/16/22 247 lb 12.8 oz (112.4 kg)  12/08/22 248 lb 1.6 oz (112.5 kg)    Physical Exam Vitals and nursing note reviewed.  Constitutional:      General: She is not in acute distress.    Appearance: Normal appearance. She is obese. She is not ill-appearing, toxic-appearing or diaphoretic.  HENT:     Head: Normocephalic and atraumatic.     Right Ear: External ear normal.     Left Ear: External ear normal.     Nose: Nose normal.     Mouth/Throat:     Mouth: Mucous membranes are moist.     Pharynx: Oropharynx is clear.  Eyes:     General: No scleral icterus.       Right eye: No discharge.        Left eye: No discharge.     Extraocular Movements: Extraocular movements intact.  Conjunctiva/sclera: Conjunctivae normal.     Pupils: Pupils are equal, round, and reactive to light.  Cardiovascular:     Rate and Rhythm: Normal rate and regular rhythm.     Pulses: Normal pulses.     Heart sounds: Normal heart sounds. No murmur heard.    No friction rub. No gallop.  Pulmonary:     Effort: Pulmonary effort is normal. No respiratory distress.     Breath sounds: Normal breath sounds. No stridor. No wheezing, rhonchi or rales.  Chest:     Chest wall: No tenderness.  Musculoskeletal:        General: Normal  range of motion.     Cervical back: Normal range of motion and neck supple.  Skin:    General: Skin is warm and dry.     Capillary Refill: Capillary refill takes less than 2 seconds.     Coloration: Skin is not jaundiced or pale.     Findings: No bruising, erythema, lesion or rash.  Neurological:     General: No focal deficit present.     Mental Status: She is alert and oriented to person, place, and time. Mental status is at baseline.  Psychiatric:        Mood and Affect: Mood normal.        Behavior: Behavior normal.        Thought Content: Thought content normal.        Judgment: Judgment normal.     Results for orders placed or performed in visit on 12/16/22  Bayer DCA Hb A1c Waived  Result Value Ref Range   HB A1C (BAYER DCA - WAIVED) 8.5 (H) 4.8 - 5.6 %  Lipid Panel w/o Chol/HDL Ratio  Result Value Ref Range   Cholesterol, Total 180 100 - 199 mg/dL   Triglycerides 782 (H) 0 - 149 mg/dL   HDL 40 >95 mg/dL   VLDL Cholesterol Cal 35 5 - 40 mg/dL   LDL Chol Calc (NIH) 621 (H) 0 - 99 mg/dL  Comprehensive metabolic panel  Result Value Ref Range   Glucose 200 (H) 70 - 99 mg/dL   BUN 17 6 - 24 mg/dL   Creatinine, Ser 3.08 0.57 - 1.00 mg/dL   eGFR 93 >65 HQ/ION/6.29   BUN/Creatinine Ratio 21 9 - 23   Sodium 141 134 - 144 mmol/L   Potassium 4.3 3.5 - 5.2 mmol/L   Chloride 99 96 - 106 mmol/L   CO2 21 20 - 29 mmol/L   Calcium 9.9 8.7 - 10.2 mg/dL   Total Protein 6.9 6.0 - 8.5 g/dL   Albumin 4.8 3.9 - 4.9 g/dL   Globulin, Total 2.1 1.5 - 4.5 g/dL   Albumin/Globulin Ratio 2.3 (H) 1.2 - 2.2   Bilirubin Total 0.2 0.0 - 1.2 mg/dL   Alkaline Phosphatase 68 44 - 121 IU/L   AST 14 0 - 40 IU/L   ALT 24 0 - 32 IU/L      Assessment & Plan:   Problem List Items Addressed This Visit       Endocrine   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 Will increase her rybelsus to 14mg  and recheck in 3  months. Call with any concerns.       Relevant Medications   Semaglutide (RYBELSUS) 14 MG TABS   Other Relevant Orders   Bayer DCA Hb A1c Waived   Microalbumin, Urine Waived  Other Visit Diagnoses     BV (bacterial vaginosis)       Will treat with flagyl. Call with any concerns.   Relevant Medications   metroNIDAZOLE (FLAGYL) 500 MG tablet   Vaginal discharge       + clue cells   Relevant Orders   WET PREP FOR TRICH, YEAST, CLUE   Lump of axilla, left       Will get [place]. Await results. Treat as needed.   Relevant Orders   [place] LIMITED ULTRASOUND INCLUDING AXILLA LEFT BREAST    MM 3D DIAGNOSTIC MAMMOGRAM BILATERAL BREAST   Encounter for screening mammogram for malignant neoplasm of breast       Mammo ordered today.   Relevant Orders   MM 3D DIAGNOSTIC MAMMOGRAM BILATERAL BREAST        Follow up plan: Return in about 3 months (around 07/07/2023).

## 2023-04-10 ENCOUNTER — Other Ambulatory Visit: Payer: Self-pay | Admitting: Family Medicine

## 2023-04-11 NOTE — Telephone Encounter (Signed)
Requested Prescriptions  Pending Prescriptions Disp Refills   fluconazole (DIFLUCAN) 150 MG tablet [Pharmacy Med Name: FLUCONAZOLE 150MG  TABLETS] 2 tablet 0    Sig: TAKE 1 TABLET(150 MG) BY MOUTH 1 TIME FOR 1 DOSE. MAY REPEAT DOSE AFTER 24 HOURS IF NOT BETTER     Off-Protocol Failed - 04/10/2023  8:35 AM      Failed - Medication not assigned to a protocol, review manually.      Passed - Valid encounter within last 12 months    Recent Outpatient Visits           5 days ago 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, without long-term current use of insulin (HCC)   [place] [facility] [place], Megan P, DO   3 months ago 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, without long-term current use of insulin (HCC)   [place] [facility] [place], Megan P, DO   4 months ago Upper respiratory tract infection, unspecified type   [place] [facility] [place], Megan P, DO   6 months ago Benign hypertensive renal disease   [place] [facility] [place], Megan P, DO   7 months ago Upper respiratory tract infection, unspecified type   [facility] [place], [place], DO       Future Appointments             In 3 months [name], Megan P, DO [facility]             naproxen (NAPROSYN) 500 MG tablet [Pharmacy Med Name: NAPROXEN 500MG  TABLETS] 180 tablet 0    Sig: TAKE 1 TABLET(500 MG) BY MOUTH TWICE DAILY WITH A MEAL     Analgesics:  NSAIDS Failed - 04/10/2023  8:35 AM      Failed - Manual Review: Labs are only required if the patient has taken medication for more than 8 weeks.      Passed - Cr in normal range and within 360 days    Creatinine  Date Value Ref Range Status  04/09/2014 0.89 0.60 - 1.30 mg/dL Final   Creatinine, Ser  Date Value Ref Range Status  12/16/2022 0.80 0.57 - 1.00 mg/dL Final         Passed  - HGB in normal range and within 360 days    Hemoglobin  Date Value Ref Range Status  09/16/2022 14.4 11.1 - 15.9 g/dL Final         Passed - PLT in normal range and within 360 days    Platelets  Date Value Ref Range Status  09/16/2022 186 150 - 450 x10E3/uL Final         Passed - HCT in normal range and within 360 days    Hematocrit  Date Value Ref Range Status  09/16/2022 44.2 34.0 - 46.6 % Final         Passed - eGFR is 30 or above and within 360 days    EGFR (African American)  Date Value Ref Range Status  04/09/2014 >60  Final   GFR calc Af Amer  Date Value Ref Range Status  11/26/2020 104 >59 mL/min/1.73 Final    Comment:    **In accordance with recommendations from the NKF-ASN Task force,**   [facility] is in the process of updating its eGFR calculation to the   2021 CKD-EPI creatinine equation that estimates kidney function   without  a race variable.    EGFR (Non-African Amer.)  Date Value Ref Range Status  04/09/2014 >60  Final    Comment:    eGFR values <74mL/min/1.73 m2 may be an indication of chronic kidney disease (CKD). Calculated eGFR is useful in patients with stable renal function. The eGFR calculation will not be reliable in acutely ill patients when serum creatinine is changing rapidly. It is not useful in  patients on dialysis. The eGFR calculation may not be applicable to patients at the low and high extremes of body sizes, pregnant women, and vegetarians.    GFR, Estimated  Date Value Ref Range Status  10/31/2021 >60 >60 mL/min Final    Comment:    (NOTE) Calculated using the CKD-EPI Creatinine Equation (2021)    eGFR  Date Value Ref Range Status  12/16/2022 93 >59 mL/min/1.73 Final         Passed - Patient is not pregnant      Passed - Valid encounter within last 12 months    Recent Outpatient Visits           5 days ago 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, without long-term current use of  insulin (HCC)   [facility] [place], Megan P, DO   3 months ago 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, without long-term current use of insulin (HCC)   [place] [facility] [place], Megan P, DO   4 months ago Upper respiratory tract infection, unspecified type   [place] [facility] [place], Megan P, DO   6 months ago Benign hypertensive renal disease   [place] [facility] [place], Megan P, DO   7 months ago Upper respiratory tract infection, unspecified type   [place] [facility] Dorcas Carrow, DO       Future Appointments             In 3 months Laural Benes, Oralia Rud, DO [place] [facility]

## 2023-04-11 NOTE — Telephone Encounter (Signed)
Requested medication (s) are due for refill today: routing for review  Requested medication (s) are on the active medication list: yes  Last refill:  03/14/23  Future visit scheduled: yes  Notes to clinic:  Medication not assigned to a protocol, review manually.      Requested Prescriptions  Pending Prescriptions Disp Refills   fluconazole (DIFLUCAN) 150 MG tablet [Pharmacy Med Name: FLUCONAZOLE 150MG  TABLETS] 2 tablet 0    Sig: TAKE 1 TABLET(150 MG) BY MOUTH 1 TIME FOR 1 DOSE. MAY REPEAT DOSE AFTER 24 HOURS IF NOT BETTER     Off-Protocol Failed - 04/10/2023  8:35 AM      Failed - Medication not assigned to a protocol, review manually.      Passed - Valid encounter within last 12 months    Recent Outpatient Visits           5 days ago 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, without long-term current use of insulin (HCC)   [place] [facility] [place], Megan P, DO   3 months ago 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, without long-term current use of insulin (HCC)   [place] [facility] [place], Megan P, DO   4 months ago Upper respiratory tract infection, unspecified type   [place] [facility] [place], Megan P, DO   6 months ago Benign hypertensive renal disease   [place] [facility] [place], Megan P, DO   7 months ago Upper respiratory tract infection, unspecified type   [place] [facility] [place], [place], DO       Future Appointments             In 3 months [name], Megan P, DO [place] [facility]            Signed Prescriptions Disp Refills   naproxen (NAPROSYN) 500 MG tablet 180 tablet 0    Sig: TAKE 1 TABLET(500 MG) BY MOUTH TWICE DAILY WITH A MEAL     Analgesics:  NSAIDS Failed - 04/10/2023  8:35 AM      Failed - Manual Review: Labs are only required if the patient has taken medication for  more than 8 weeks.      Passed - Cr in normal range and within 360 days    Creatinine  Date Value Ref Range Status  04/09/2014 0.89 0.60 - 1.30 mg/dL Final   Creatinine, Ser  Date Value Ref Range Status  12/16/2022 0.80 0.57 - 1.00 mg/dL Final         Passed - HGB in normal range and within 360 days    Hemoglobin  Date Value Ref Range Status  09/16/2022 14.4 11.1 - 15.9 g/dL Final         Passed - PLT in normal range and within 360 days    Platelets  Date Value Ref Range Status  09/16/2022 186 150 - 450 x10E3/uL Final         Passed - HCT in normal range and within 360 days    Hematocrit  Date Value Ref Range Status  09/16/2022 44.2 34.0 - 46.6 % Final         Passed - eGFR is 30 or above and within 360 days    EGFR (African American)  Date Value Ref Range Status  04/09/2014 >60  Final   GFR calc Af Denyse Dago  Date Value Ref Range Status  11/26/2020 104 >59 mL/min/1.73 Final    Comment:    **In accordance with recommendations from the NKF-ASN Task force,**   [facility] is in the process of updating its eGFR calculation to the   2021 CKD-EPI creatinine equation that estimates kidney function   without a race variable.    EGFR (Non-African Amer.)  Date Value Ref Range Status  04/09/2014 >60  Final    Comment:    eGFR values <58mL/min/1.73 m2 may be an indication of chronic kidney disease (CKD). Calculated eGFR is useful in patients with stable renal function. The eGFR calculation will not be reliable in acutely ill patients when serum creatinine is changing rapidly. It is not useful in  patients on dialysis. The eGFR calculation may not be applicable to patients at the low and high extremes of body sizes, pregnant women, and vegetarians.    GFR, Estimated  Date Value Ref Range Status  10/31/2021 >60 >60 mL/min Final    Comment:    (NOTE) Calculated using the CKD-EPI Creatinine Equation (2021)    eGFR  Date Value Ref Range Status  12/16/2022 93 >59  mL/min/1.73 Final         Passed - Patient is not pregnant      Passed - Valid encounter within last 12 months    Recent Outpatient Visits           5 days ago 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, without long-term current use of insulin (HCC)   [place] [facility] [place], Megan P, DO   3 months ago 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, without long-term current use of insulin (HCC)   [place] [facility] [place], Megan P, DO   4 months ago Upper respiratory tract infection, unspecified type   [place] [facility] [place], Megan P, DO   6 months ago Benign hypertensive renal disease   [place] [facility] [place], Megan P, DO   7 months ago Upper respiratory tract infection, unspecified type   [place] [facility] Dorcas Carrow, DO       Future Appointments             In 3 months Laural Benes, Oralia Rud, DO  [facility]

## 2023-04-12 ENCOUNTER — Other Ambulatory Visit: Payer: Self-pay | Admitting: Family Medicine

## 2023-04-13 NOTE — Telephone Encounter (Signed)
Requested Prescriptions  Pending Prescriptions Disp Refills   metoprolol succinate (TOPROL-XL) 25 MG 24 hr tablet [Pharmacy Med Name: METOPROLOL ER SUCCINATE 25MG  TABS] 90 tablet 1    Sig: TAKE 1/2 TO 1 TABLET(12.5 TO 25 MG) BY MOUTH DAILY     Cardiovascular:  Beta Blockers Passed - 04/12/2023  7:57 AM      Passed - Last BP in normal range    BP Readings from Last 1 Encounters:  04/06/23 132/79         Passed - Last Heart Rate in normal range    Pulse Readings from Last 1 Encounters:  04/06/23 89         Passed - Valid encounter within last 6 months    Recent Outpatient Visits           1 week ago 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, without long-term current use of insulin (HCC)   [place] [facility] [place], Megan P, DO   3 months ago 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, without long-term current use of insulin (HCC)   [place] [facility] [place], Megan P, DO   4 months ago Upper respiratory tract infection, unspecified type   [place] [facility] [place], Megan P, DO   6 months ago Benign hypertensive renal disease   [place] [facility] [place], Megan P, DO   7 months ago Upper respiratory tract infection, unspecified type   [place] [facility] Dorcas Carrow, DO       Future Appointments             In 2 months Laural Benes, Oralia Rud, DO [place] [facility] [place]

## 2023-04-18 ENCOUNTER — Encounter: Payer: Self-pay | Admitting: Family Medicine

## 2023-04-25 ENCOUNTER — Other Ambulatory Visit: Payer: BC Managed Care – PPO

## 2023-05-01 ENCOUNTER — Other Ambulatory Visit: Payer: Self-pay | Admitting: Family Medicine

## 2023-05-01 NOTE — Telephone Encounter (Signed)
Requested Prescriptions  Pending Prescriptions Disp Refills   pantoprazole (PROTONIX) 40 MG tablet [Pharmacy Med Name: PANTOPRAZOLE 40MG  TABLETS] 180 tablet 1    Sig: TAKE 1 TABLET(40 MG) BY MOUTH TWICE DAILY     Gastroenterology: Proton Pump Inhibitors Passed - 05/01/2023  8:03 AM      Passed - Valid encounter within last 12 months    Recent Outpatient Visits           3 weeks ago 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, without long-term current use of insulin (HCC)   [place] [facility] [place], Megan P, DO   4 months ago 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, without long-term current use of insulin (HCC)   [place] [facility] [place], Megan P, DO   4 months ago Upper respiratory tract infection, unspecified type   [place] [facility] [place], Megan P, DO   7 months ago Benign hypertensive renal disease   [place] [facility] [place], Megan P, DO   8 months ago Upper respiratory tract infection, unspecified type   [place] [facility] [place], [place], DO       Future Appointments             In 2 months [name], Megan P, DO [place] [facility]             lisinopril (ZESTRIL) 5 MG tablet [Pharmacy Med Name: LISINOPRIL 5MG  TABLETS] 90 tablet 1    Sig: TAKE 1 TABLET(5 MG) BY MOUTH DAILY     Cardiovascular:  ACE Inhibitors Passed - 05/01/2023  8:03 AM      Passed - Cr in normal range and within 180 days    Creatinine  Date Value Ref Range Status  04/09/2014 0.89 0.60 - 1.30 mg/dL Final   Creatinine, Ser  Date Value Ref Range Status  12/16/2022 0.80 0.57 - 1.00 mg/dL Final         Passed - K in normal range and within 180 days    Potassium  Date Value Ref Range Status  12/16/2022 4.3 3.5 - 5.2 mmol/L Final  04/09/2014 4.2 3.5 - 5.1 mmol/L Final         Passed - Patient is not pregnant       Passed - Last BP in normal range    BP Readings from Last 1 Encounters:  04/06/23 132/79         Passed - Valid encounter within last 6 months    Recent Outpatient Visits           3 weeks ago 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, without long-term current use of insulin (HCC)   [place] [facility] [place], Megan P, DO   4 months ago 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, without long-term current use of insulin (HCC)   [place] [facility] [place], Megan P, DO   4 months ago Upper respiratory tract infection, unspecified type   [place] [facility] [employer], Megan P, DO   7 months ago Benign hypertensive renal disease   [place] [facility] [place], Megan P, DO   8 months ago Upper respiratory tract infection, unspecified type   [place] [facility] Dorcas Carrow, DO       Future Appointments  In 2 months Laural Benes, Oralia Rud, DO [place], [facility]

## 2023-05-05 ENCOUNTER — Other Ambulatory Visit: Payer: Self-pay | Admitting: Physician Assistant

## 2023-05-05 DIAGNOSIS — R42 Dizziness and giddiness: Secondary | ICD-10-CM

## 2023-05-05 DIAGNOSIS — R2689 Other abnormalities of gait and mobility: Secondary | ICD-10-CM

## 2023-05-05 DIAGNOSIS — M542 Cervicalgia: Secondary | ICD-10-CM

## 2023-05-05 DIAGNOSIS — R200 Anesthesia of skin: Secondary | ICD-10-CM

## 2023-05-05 DIAGNOSIS — M5416 Radiculopathy, lumbar region: Secondary | ICD-10-CM

## 2023-05-08 ENCOUNTER — Other Ambulatory Visit: Payer: Self-pay | Admitting: Family Medicine

## 2023-05-09 NOTE — Telephone Encounter (Signed)
Requested Prescriptions  Refused Prescriptions Disp Refills   traZODone (DESYREL) 150 MG tablet [Pharmacy Med Name: TRAZODONE 150MG  (HUNDRED-FIFTY) TAB] 180 tablet 1    Sig: TAKE 1 TABLET BY MOUTH EVERY DAY AT BEDTIME     Psychiatry: Antidepressants - Serotonin Modulator Passed - 05/08/2023  9:06 AM      Passed - Valid encounter within last 6 months    Recent Outpatient Visits           1 month ago 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, without long-term current use of insulin (HCC)   [place] [facility] [place], Megan P, DO   4 months ago 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, without long-term current use of insulin (HCC)   [place] [facility] [place], Megan P, DO   5 months ago Upper respiratory tract infection, unspecified type   [place] [facility] [place], Megan P, DO   7 months ago Benign hypertensive renal disease   [place] [facility] [place], Megan P, DO   8 months ago Upper respiratory tract infection, unspecified type   [place] [facility] Dorcas Carrow, DO       Future Appointments             In 2 months Laural Benes, Oralia Rud, DO [place] [facility], [facility]

## 2023-05-18 ENCOUNTER — Encounter: Payer: Self-pay | Admitting: Internal Medicine

## 2023-05-18 ENCOUNTER — Encounter: Payer: Self-pay | Admitting: Physician Assistant

## 2023-05-19 ENCOUNTER — Other Ambulatory Visit: Payer: BC Managed Care – PPO

## 2023-05-19 ENCOUNTER — Encounter: Payer: Self-pay | Admitting: Physician Assistant

## 2023-05-22 ENCOUNTER — Ambulatory Visit
Admission: RE | Admit: 2023-05-22 | Discharge: 2023-05-22 | Disposition: A | Discharge status: Home / Self Care | Payer: BC Managed Care – PPO | Source: Ambulatory Visit | Attending: Physician Assistant | Admitting: Physician Assistant

## 2023-05-22 DIAGNOSIS — M5416 Radiculopathy, lumbar region: Secondary | ICD-10-CM

## 2023-05-22 DIAGNOSIS — R42 Dizziness and giddiness: Secondary | ICD-10-CM

## 2023-05-22 DIAGNOSIS — R200 Anesthesia of skin: Secondary | ICD-10-CM

## 2023-05-22 DIAGNOSIS — M542 Cervicalgia: Secondary | ICD-10-CM

## 2023-05-22 DIAGNOSIS — R202 Paresthesia of skin: Secondary | ICD-10-CM

## 2023-05-22 DIAGNOSIS — R2689 Other abnormalities of gait and mobility: Secondary | ICD-10-CM

## 2023-05-23 ENCOUNTER — Ambulatory Visit
Admission: RE | Admit: 2023-05-23 | Discharge: 2023-05-23 | Disposition: A | Discharge status: Home / Self Care | Payer: BC Managed Care – PPO | Source: Ambulatory Visit | Attending: Neurology | Admitting: Neurology

## 2023-05-23 ENCOUNTER — Other Ambulatory Visit: Payer: Self-pay | Admitting: Neurology

## 2023-05-23 DIAGNOSIS — R937 Abnormal findings on diagnostic imaging of other parts of musculoskeletal system: Secondary | ICD-10-CM

## 2023-05-23 MED ORDER — GADOBUTROL 1 MMOL/ML IV SOLN
10.0000 mL | Freq: Once | INTRAVENOUS | Status: AC | PRN
  Administered 2023-05-23: 10 mL via INTRAVENOUS

## 2023-05-27 ENCOUNTER — Other Ambulatory Visit: Payer: Self-pay | Admitting: Family Medicine
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
## 2023-05-30 ENCOUNTER — Ambulatory Visit: Payer: BC Managed Care – PPO | Admitting: Neurosurgery

## 2023-05-30 ENCOUNTER — Encounter: Payer: Self-pay | Admitting: Neurosurgery

## 2023-05-30 VITALS — BP 130/76 | Ht 72.0 in | Wt 239.6 lb

## 2023-05-30 DIAGNOSIS — D497 Neoplasm of unspecified behavior of endocrine glands and other parts of nervous system: Secondary | ICD-10-CM | POA: Diagnosis not present

## 2023-05-30 NOTE — Addendum Note (Signed)
Addended by: Sharlot Gowda on: 05/30/2023 11:10 AM   Modules accepted: Orders

## 2023-05-30 NOTE — Telephone Encounter (Signed)
Requested medications are due for refill today.  yes  Requested medications are on the active medications list.  yes  Last refill. 09/12/2022 #60 6 rf  Future visit scheduled.   yes  Notes to clinic.  Refill not delegated.    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, without long-term current use of insulin (HCC)   [facility] [place], Megan P, DO   5 months ago 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, without long-term current use of insulin (HCC)   [place] [facility] [place], Megan P, DO   5 months ago Upper respiratory tract infection, unspecified type   [place] [facility] [place], Megan P, DO   8 months ago Benign hypertensive renal disease   [place] [facility] [address] [place], Megan P, DO   9 months ago Upper respiratory tract infection, unspecified type   [place] [facility] Dorcas Carrow, DO       Future Appointments             In 1 month [name], Oralia Rud, DO [place] [facility]

## 2023-05-30 NOTE — Patient Instructions (Signed)
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   Pre-op appointment at [facility] Pre-admit Testing: we will call you with a date/time for this. If you are scheduled for an in person appointment, Pre-admit Testing is located on the first floor of the [facility] building, [address], [address]. Please bring all prescriptions in the original prescription bottles to your appointment, even if you have reviewed medications by phone with a pharmacy representative. During this appointment, they will advise you which medications you can take the morning of surgery, and which medications you will need to hold for surgery. Pre-op labs may be done at your pre-op appointment. You are not required to fast for these labs. Should you need to change your pre-op appointment, please call Pre-admit testing at [phone].     Diabetes medications:  Semaglutide (Rybelsus) oral: hold for 3 days prior to surgery Metformin: hold for 2 days prior to surgery    Surgical clearance: we will send a clearance form to Dr Laural Benes    Common restrictions after surgery: No bending, lifting, or twisting ("BLT"). Avoid lifting objects heavier than 10 pounds for the first 6 weeks after surgery. Where possible, avoid household activities that involve lifting, bending, reaching, pushing, or pulling such as laundry, vacuuming, grocery shopping, and childcare. Try to arrange for help from friends and family for these activities while you heal. Do not drive while taking prescription pain medication. Weeks 6 through 12 after surgery: avoid lifting more than 25 pounds.     How to contact us:  If you have any questions/concerns before or  after surgery, you can reach [place] at [phone], or you can send a mychart message. We can be reached by phone or mychart 8am-4pm, Monday-Friday.  *Please note: Calls after 4pm are forwarded to a third party answering service. Mychart messages are not routinely monitored during evenings, weekends, and holidays. Please call our office to contact the answering service for urgent concerns during non-business hours.     If you have FMLA/disability paperwork, please drop it off or fax it to [phone], attention Patty.   Appointments/FMLA & disability paperwork: Patty & Cristin  Nurse: Royston Cowper  Medical assistants: Laurann Montana, & Lyla Son Physician Assistants: Manning Charity & Drake Leach Surgeons: Venetia Night, MD & Ernestine Mcmurray, MD

## 2023-05-30 NOTE — Addendum Note (Signed)
Addended by: Venetia Night on: 05/30/2023 11:03 AM   Modules accepted: Orders

## 2023-05-31 ENCOUNTER — Encounter: Payer: Self-pay | Admitting: Family Medicine

## 2023-05-31 NOTE — Addendum Note (Signed)
Addended by: Ernie Hew on: 05/31/2023 10:40 AM   Modules accepted: Orders

## 2023-06-02 ENCOUNTER — Other Ambulatory Visit: Payer: Self-pay

## 2023-06-02 DIAGNOSIS — Z01818 Encounter for other preprocedural examination: Secondary | ICD-10-CM

## 2023-06-09 ENCOUNTER — Ambulatory Visit: Payer: BC Managed Care – PPO | Admitting: Family Medicine

## 2023-06-09 ENCOUNTER — Encounter: Payer: Self-pay | Admitting: Family Medicine

## 2023-06-09 VITALS — BP 133/85 | HR 85 | Ht 72.0 in | Wt 242.0 lb

## 2023-06-09 DIAGNOSIS — Z01818 Encounter for other preprocedural examination: Secondary | ICD-10-CM

## 2023-06-09 DIAGNOSIS — E1122 Type 2 diabetes mellitus with diabetic chronic kidney disease: Secondary | ICD-10-CM | POA: Diagnosis not present

## 2023-06-09 DIAGNOSIS — N181 Chronic kidney disease, stage 1: Secondary | ICD-10-CM | POA: Diagnosis not present

## 2023-06-09 DIAGNOSIS — Z7984 Long term (current) use of oral hypoglycemic drugs: Secondary | ICD-10-CM | POA: Diagnosis not present

## 2023-06-09 LAB — COAGUCHEK XS/INR WAIVED
INR: 1 (ref 0.9–1.1)
Prothrombin Time: 11.9 s

## 2023-06-09 LAB — BAYER DCA HB A1C WAIVED: HB A1C (BAYER DCA - WAIVED): 8.6 % — ABNORMAL HIGH (ref 4.8–5.6)

## 2023-06-09 MED ORDER — EMPAGLIFLOZIN 25 MG PO TABS: 25.0000 mg | ORAL_TABLET | Freq: Every day | ORAL | 6 refills | Status: DC
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
## 2023-06-09 NOTE — Assessment & Plan Note (Signed)
A1c has gone up to 8.6 since last visit. Will add jardiance and recheck in 3 months. Call with any concerns. Continue to monitor.

## 2023-06-10 LAB — CBC WITH DIFFERENTIAL/PLATELET
Basophils Absolute: 0 10*3/uL (ref 0.0–0.2)
Basos: 0 %
EOS (ABSOLUTE): 0.1 10*3/uL (ref 0.0–0.4)
Eos: 2 %
Hematocrit: 42.9 % (ref 34.0–46.6)
Hemoglobin: 14 g/dL (ref 11.1–15.9)
Immature Grans (Abs): 0 10*3/uL (ref 0.0–0.1)
Immature Granulocytes: 0 %
Lymphocytes Absolute: 3 10*3/uL (ref 0.7–3.1)
Lymphs: 35 %
MCH: 26.6 pg (ref 26.6–33.0)
MCHC: 32.6 g/dL (ref 31.5–35.7)
MCV: 82 fL (ref 79–97)
Monocytes Absolute: 0.6 10*3/uL (ref 0.1–0.9)
Monocytes: 7 %
Neutrophils Absolute: 4.8 10*3/uL (ref 1.4–7.0)
Neutrophils: 56 %
Platelets: 201 10*3/uL (ref 150–450)
RBC: 5.26 x10E6/uL (ref 3.77–5.28)
RDW: 14.7 % (ref 11.7–15.4)
WBC: 8.6 10*3/uL (ref 3.4–10.8)

## 2023-06-10 LAB — BASIC METABOLIC PANEL
BUN/Creatinine Ratio: 23 (ref 9–23)
BUN: 16 mg/dL (ref 6–24)
CO2: 18 mmol/L — ABNORMAL LOW (ref 20–29)
Calcium: 9.6 mg/dL (ref 8.7–10.2)
Chloride: 106 mmol/L (ref 96–106)
Creatinine, Ser: 0.69 mg/dL (ref 0.57–1.00)
Glucose: 241 mg/dL — ABNORMAL HIGH (ref 70–99)
Potassium: 4.6 mmol/L (ref 3.5–5.2)
Sodium: 142 mmol/L (ref 134–144)
eGFR: 109 mL/min/{1.73_m2} (ref >59)

## 2023-06-11 NOTE — Progress Notes (Signed)
Interpreted by me 06/09/23. NSR at 80bpm, incomplete LBBB

## 2023-06-11 NOTE — Progress Notes (Signed)
Patient notified of results  by [facility] reviewed and normal. Cleared for surgery with medium risk due to A1c of 8.6.   Please send copy of note with paperwork to Dr. Myer Haff.

## 2023-06-12 NOTE — Progress Notes (Signed)
Clearance faxed by Denzil Magnuson 06/12/23

## 2023-06-13 ENCOUNTER — Other Ambulatory Visit: Payer: Self-pay | Admitting: Family Medicine

## 2023-06-14 ENCOUNTER — Telehealth: Payer: Self-pay | Admitting: Urgent Care

## 2023-06-14 NOTE — Telephone Encounter (Signed)
Requested medication (s) are due for refill today:   Not sure for gabapentin;  Yes for metformin and Singulair   Requested medication (s) are on the active medication list:   No for gabapentin it's listed at historical.   Also that it was discontinued 06/09/2023.  Future visit scheduled:   Yes   Last ordered: gabapentin discontinued 06/09/2023;   metformin 03/14/2023 #180, 0 refills;   Singulair 03/14/2023 #90, 0 refills  Returned because the status of the gabapentin is unclear.     Requested Prescriptions  Pending Prescriptions Disp Refills   gabapentin (NEURONTIN) 100 MG capsule [Pharmacy Med Name: GABAPENTIN 100MG  CAPSULES] 90 capsule 1    Sig: TAKE 1 CAPSULE(100 MG) BY MOUTH AT BEDTIME     Neurology: Anticonvulsants - gabapentin Failed - 06/13/2023 12:35 PM      Failed - Completed PHQ-2 or PHQ-9 in the last 360 days      Passed - Cr in normal range and within 360 days    Creatinine  Date Value Ref Range Status  04/09/2014 0.89 0.60 - 1.30 mg/dL Final   Creatinine, Ser  Date Value Ref Range Status  06/09/2023 0.69 0.57 - 1.00 mg/dL Final         Passed - Valid encounter within last 12 months    Recent Outpatient Visits           5 days ago Preop exam for internal medicine   [place] [facility] [place], Megan P, DO   2 months ago 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, without long-term current use of insulin (HCC)   [place] [facility] [place], Megan P, DO   6 months ago 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, without long-term current use of insulin (HCC)   [place] [facility] [place], Megan P, DO   6 months ago Upper respiratory tract infection, unspecified type   [place] [facility] [place], Megan P, DO   9 months ago Benign hypertensive renal disease   [place] [facility] [place], [place], DO       Future Appointments              In 2 months [name], Megan P, DO [place] [facility]             metFORMIN (GLUCOPHAGE) 500 MG tablet [Pharmacy Med Name: METFORMIN 500MG  TABLETS] 180 tablet 0    Sig: TAKE 1 TABLET(500 MG) BY MOUTH TWICE DAILY WITH A MEAL     Endocrinology:  Diabetes - Biguanides Failed - 06/13/2023 12:35 PM      Failed - HBA1C is between 0 and 7.9 and within 180 days    HB A1C (BAYER DCA - WAIVED)  Date Value Ref Range Status  06/09/2023 8.6 (H) 4.8 - 5.6 % Final    Comment:             Prediabetes: 5.7 - 6.4          Diabetes: >6.4          Glycemic control for adults with diabetes: <7.0          Failed - B12 Level in normal range and within 720 days    Vitamin B-12  Date Value Ref Range Status  03/20/2020 >2000 (H) 232 - 1245 pg/mL Final         Passed - Cr in normal range and within 360  days    Creatinine  Date Value Ref Range Status  04/09/2014 0.89 0.60 - 1.30 mg/dL Final   Creatinine, Ser  Date Value Ref Range Status  06/09/2023 0.69 0.57 - 1.00 mg/dL Final         Passed - eGFR in normal range and within 360 days    EGFR (African American)  Date Value Ref Range Status  04/09/2014 >60  Final   GFR calc Af Amer  Date Value Ref Range Status  11/26/2020 104 >59 mL/min/1.73 Final    Comment:    **In accordance with recommendations from the NKF-ASN Task force,**   [facility] is in the process of updating its eGFR calculation to the   2021 CKD-EPI creatinine equation that estimates kidney function   without a race variable.    EGFR (Non-African Amer.)  Date Value Ref Range Status  04/09/2014 >60  Final    Comment:    eGFR values <54mL/min/1.73 m2 may be an indication of chronic kidney disease (CKD). Calculated eGFR is useful in patients with stable renal function. The eGFR calculation will not be reliable in acutely ill patients when serum creatinine is changing rapidly. It is not useful in  patients on dialysis. The eGFR calculation may  not be applicable to patients at the low and high extremes of body sizes, pregnant women, and vegetarians.    GFR, Estimated  Date Value Ref Range Status  10/31/2021 >60 >60 mL/min Final    Comment:    (NOTE) Calculated using the CKD-EPI Creatinine Equation (2021)    eGFR  Date Value Ref Range Status  06/09/2023 109 >59 mL/min/1.73 Final         Passed - Valid encounter within last 6 months    Recent Outpatient Visits           5 days ago Preop exam for internal medicine   [place] [facility] [place], Megan P, DO   2 months ago 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, without long-term current use of insulin (HCC)   [place] [facility], Megan P, DO   6 months ago 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, without long-term current use of insulin (HCC)   [place] [facility] [place], Megan P, DO   6 months ago Upper respiratory tract infection, unspecified type   [place] [facility] [place], Megan P, DO   9 months ago Benign hypertensive renal disease   [place] [facility] [place], [place], DO       Future Appointments             In 2 months [name], Megan P, DO [place] [facility]            Passed - CBC within normal limits and completed in the last 12 months    WBC  Date Value Ref Range Status  06/09/2023 8.6 3.4 - 10.8 x10E3/uL Final  10/31/2021 9.8 4.0 - 10.5 K/uL Final   RBC  Date Value Ref Range Status  06/09/2023 5.26 3.77 - 5.28 x10E6/uL Final  10/31/2021 5.30 (H) 3.87 - 5.11 MIL/uL Final   Hemoglobin  Date Value Ref Range Status  06/09/2023 14.0 11.1 - 15.9 g/dL Final   Hematocrit  Date Value Ref Range Status  06/09/2023 42.9 34.0 - 46.6 % Final   MCHC  Date Value Ref Range Status  06/09/2023 32.6 31.5 - 35.7 g/dL  Final  10/31/2021 33.6 30.0 - 36.0 g/dL Final   [facility]   Date Value Ref Range Status  06/09/2023 26.6 26.6 - 33.0 pg Final  10/31/2021 27.7 26.0 - 34.0 pg Final   MCV  Date Value Ref Range Status  06/09/2023 82 79 - 97 fL Final  04/09/2014 77 (L) 80 - 100 fL Final   No results found for: "PLTCOUNTKUC", "LABPLAT", "POCPLA" RDW  Date Value Ref Range Status  06/09/2023 14.7 11.7 - 15.4 % Final  04/09/2014 15.5 (H) 11.5 - 14.5 % Final          montelukast (SINGULAIR) 10 MG tablet [Pharmacy Med Name: MONTELUKAST 10MG  TABLETS] 90 tablet 0    Sig: TAKE 1 TABLET(10 MG) BY MOUTH AT BEDTIME     Pulmonology:  Leukotriene Inhibitors Passed - 06/13/2023 12:35 PM      Passed - Valid encounter within last 12 months    Recent Outpatient Visits           5 days ago Preop exam for internal medicine   [facility] [address], Megan P, DO   2 months ago 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, without long-term current use of insulin (HCC)   [place] [facility] [place], Megan P, DO   6 months ago 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, without long-term current use of insulin (HCC)   [place] [facility] [place], Megan P, DO   6 months ago Upper respiratory tract infection, unspecified type   [place] [facility] [place], Megan P, DO   9 months ago Benign hypertensive renal disease   [place] [facility] Dorcas Carrow, DO       Future Appointments             In 2 months Laural Benes, Oralia Rud, DO  [facility]

## 2023-06-14 NOTE — Progress Notes (Signed)
  Perioperative Services: Pre-Admission/Anesthesia Testing  Abnormal Lab Notification    Date: 06/14/23  Name: [name] MRN:   [id number]  Re: Abnormal preoperative labs prior to upcoming neurosurgical procedure  Provider(s) Notified: Venetia Night, MD Notification mode: Routed and/or faxed via [facility]   ABNORMAL LAB VALUE(S): Lab Results  Component Value Date   GLUCOSE 241 (H) 06/09/2023    Lab Results  Component Value Date   HGBA1C 8.6 (H) 06/09/2023   Notes: Patient with a T2DM diagnosis. She is currently multi-agent oral therapy (empagliflozin + metformin + semaglutide). Last Hgb A1c was 8.6% on 06/09/2023.  In light of the aforementioned A1c, her diabetes could pose increased risks for the patient during their perioperative course and subsequent recovery period. With that being said, the benefit of improving glycemic control must be weighed against the overall risks associated with delaying a necessary elective surgical procedure for this patient.   In efforts to reduce the risk of developing SSI, or other potential perioperative complications, communicated with surgeon's office in order to determine if patient is deemed to be adequately optimized  for surgery. Of note, patient has been cleared at a MODERATE risk by her PCP Laural Benes, DO). In review of the available documentation, risk stratification escalated to moderate due to patient's recent A1c result. Surgeon' s office Carney Bern, RN) advising that MD is aware is ok with proceeding with surgery as planned.    Quentin Mulling, MSN, APRN, FNP-C, CEN [facility]  Peri-operative Services Nurse Practitioner Phone: [phone] Fax: [phone] Email: [email or web address] 06/14/23 9:02 PM

## 2023-06-15 ENCOUNTER — Telehealth: Payer: Self-pay

## 2023-06-15 ENCOUNTER — Encounter: Payer: Self-pay | Admitting: Urgent Care

## 2023-06-15 NOTE — Telephone Encounter (Signed)
Patient needs a work note for her mom and aunt that are coming from [place] to stay with her the first week of surgery. Raphael Gibney (mom) (aunt) [place] Charon, their names needs to be on the note. They will be here starting 06/26/2023. Patient will access the note through mychart.

## 2023-06-15 NOTE — Telephone Encounter (Signed)
I contacted [name] to ask if she would like to move her surgery up to 06/26/23 due to an opening in our schedule. She would like to discuss this with her work and will contact me back ASAP.

## 2023-06-15 NOTE — Telephone Encounter (Signed)
I spoke with [name]. Work notes have been written for her mother and niece. Surgery has been moved to 06/26/23.

## 2023-06-15 NOTE — Telephone Encounter (Signed)
Yes, she would like to move her surgery up.

## 2023-06-16 ENCOUNTER — Other Ambulatory Visit: Payer: Self-pay

## 2023-06-16 ENCOUNTER — Ambulatory Visit
Admission: RE | Admit: 2023-06-16 | Discharge: 2023-06-16 | Disposition: A | Discharge status: Home / Self Care | Payer: BC Managed Care – PPO | Source: Ambulatory Visit | Attending: Neurosurgery | Admitting: Neurosurgery

## 2023-06-16 ENCOUNTER — Encounter
Admission: RE | Admit: 2023-06-16 | Discharge: 2023-06-16 | Disposition: A | Discharge status: Home / Self Care | Payer: BC Managed Care – PPO | Source: Ambulatory Visit | Attending: Neurosurgery | Admitting: Neurosurgery

## 2023-06-16 VITALS — BP 146/85 | HR 97 | Temp 98.7°F | Resp 18 | Ht 72.0 in | Wt 234.4 lb

## 2023-06-16 DIAGNOSIS — Z01812 Encounter for preprocedural laboratory examination: Secondary | ICD-10-CM | POA: Diagnosis present

## 2023-06-16 DIAGNOSIS — Z01818 Encounter for other preprocedural examination: Secondary | ICD-10-CM | POA: Diagnosis present

## 2023-06-16 DIAGNOSIS — E1122 Type 2 diabetes mellitus with diabetic chronic kidney disease: Secondary | ICD-10-CM

## 2023-06-16 DIAGNOSIS — N181 Chronic kidney disease, stage 1: Secondary | ICD-10-CM | POA: Diagnosis present

## 2023-06-16 DIAGNOSIS — D497 Neoplasm of unspecified behavior of endocrine glands and other parts of nervous system: Secondary | ICD-10-CM | POA: Diagnosis not present

## 2023-06-16 HISTORY — DX: Type 2 diabetes mellitus with diabetic chronic kidney disease: E11.22

## 2023-06-16 HISTORY — DX: Personal history of other diseases of the musculoskeletal system and connective tissue: Z87.39

## 2023-06-16 LAB — URINALYSIS, COMPLETE (UACMP) WITH MICROSCOPIC
Bacteria, UA: NONE SEEN
Bilirubin Urine: NEGATIVE
Glucose, UA: >=500 mg/dL — AB
Hgb urine dipstick: NEGATIVE
Ketones, ur: NEGATIVE mg/dL
Leukocytes,Ua: NEGATIVE
Nitrite: NEGATIVE
Protein, ur: NEGATIVE mg/dL
Specific Gravity, Urine: 1.027 (ref 1.005–1.030)
pH: 5 (ref 5.0–8.0)

## 2023-06-16 LAB — TYPE AND SCREEN
ABO/RH(D): A POS
Antibody Screen: NEGATIVE

## 2023-06-16 LAB — SURGICAL PCR SCREEN
MRSA, PCR: NEGATIVE
Staphylococcus aureus: NEGATIVE

## 2023-06-16 MED ORDER — GADOBUTROL 1 MMOL/ML IV SOLN
10.0000 mL | Freq: Once | INTRAVENOUS | Status: AC | PRN
  Administered 2023-06-16: 10 mL via INTRAVENOUS

## 2023-06-16 NOTE — Patient Instructions (Addendum)
Your procedure is scheduled on: Monday September 9 Report to the Registration Desk on the 1st floor of the [facility] To find out your arrival time, please call [phone] between 1PM - 3PM on: Friday September  6 If your arrival time is 6:00 am, do not arrive before that time as the [facility] entrance doors do not open until 6:00 am.  REMEMBER: Instructions that are not followed completely may result in serious medical risk, up to and including death; or upon the discretion of your surgeon and anesthesiologist your surgery may need to be rescheduled.  Do not eat food after midnight the night before surgery.  No gum chewing or hard candies.  You may however, drink WATER up to 2 hours before you are scheduled to arrive for your surgery. Do not drink anything within 2 hours of your scheduled arrival time.  One week prior to surgery: Starting Monday September 2  Stop Anti-inflammatories (NSAIDS) such as Advil, Aleve, Ibuprofen, Motrin, Naproxen, Naprosyn and Aspirin based products such as Excedrin, Goody's Powder, BC Powder.  Stop ANY OVER THE COUNTER supplements until after surgery. Cyanocobalamin  cetirizine (ZYRTEC) hold the day of surgery , last dose September 10   You may however, continue to take Tylenol if needed for pain up until the day of surgery.  Continue taking all prescribed medications with the exception of the following: empagliflozin (JARDIANCE) hold 3 days prior to surgery, last dose Thursday September 5 lisinopril (ZESTRIL) hold the day of surgery, last dose Sunday  September 8   metFORMIN (GLUCOPHAGE) hold 2 days prior to surgery, last dose Friday September 6  Semaglutide [facility]) hold 24 hours prior to surgery, last dose Sunday  September 8   TAKE ONLY THESE MEDICATIONS THE MORNING OF SURGERY WITH A SIP OF WATER:   busPIRone (BUSPAR)  diazepam (VALIUM) if needed gabapentin (NEURONTIN)  pantoprazole (PROTONIX) Please take one at night and in the  morning TRELEGY ELLIPTA   No Alcohol for 24 hours before or after surgery.  No Smoking including e-cigarettes for 24 hours before surgery.  No chewable tobacco products for at least 6 hours before surgery.  No nicotine patches on the day of surgery.  Do not use any "recreational" drugs for at least a week (preferably 2 weeks) before your surgery.  Please be advised that the combination of cocaine and anesthesia may have negative outcomes, up to and including death. If you test positive for cocaine, your surgery will be cancelled.  On the morning of surgery brush your teeth with toothpaste and water, you may rinse your mouth with mouthwash if you wish. Do not swallow any toothpaste or mouthwash.  Use CHG Soap or wipes as directed on instruction sheet.  Do not wear jewelry, make-up, hairpins, clips or nail polish.  Do not wear lotions, powders, or perfumes.   Do not shave body hair from the neck down 48 hours before surgery.  Do not bring valuables to the hospital. [facility] is not responsible for any missing/lost belongings or valuables.   Notify your doctor if there is any change in your medical condition (cold, fever, infection).  Wear comfortable clothing (specific to your surgery type) to the hospital.  After surgery, you can help prevent lung complications by doing breathing exercises.  Take deep breaths and cough every 1-2 hours. Your doctor may order a device called an Incentive Spirometer to help you take deep breaths. When coughing or sneezing, hold a pillow firmly against your incision with both hands.  This is called "splinting." Doing this helps protect your incision. It also decreases belly discomfort.  If you are being discharged the day of surgery, you will not be allowed to drive home. You will need a responsible individual to drive you home and stay with you for 24 hours after surgery.   If you are taking public transportation, you will need to have a responsible  individual with you.  Please call the Pre-admissions Testing Dept. at [phone] if you have any questions about these instructions.  Surgery Visitation Policy:  Patients having surgery or a procedure may have two visitors.  Children under the age of 24 must have an adult with them who is not the patient.        Pre-operative 5 CHG Bath Instructions   You can play a key role in reducing the risk of infection after surgery. Your skin needs to be as free of germs as possible. You can reduce the number of germs on your skin by washing with CHG (chlorhexidine gluconate) soap before surgery. CHG is an antiseptic soap that kills germs and continues to kill germs even after washing.   DO NOT use if you have an allergy to chlorhexidine/CHG or antibacterial soaps. If your skin becomes reddened or irritated, stop using the CHG and notify one of our RNs at [phone].   Please shower with the CHG soap starting 4 days before surgery using the following schedule:     Please keep in mind the following:  DO NOT shave, including legs and underarms, starting the day of your first shower.   You may shave your face at any point before/day of surgery.  Place clean sheets on your bed the day you start using CHG soap. Use a clean washcloth (not used since being washed) for each shower. DO NOT sleep with pets once you start using the CHG.   CHG Shower Instructions:  If you choose to wash your hair and private area, wash first with your normal shampoo/soap.  After you use shampoo/soap, rinse your hair and body thoroughly to remove shampoo/soap residue.  Turn the water OFF and apply about 3 tablespoons (45 ml) of CHG soap to a CLEAN washcloth.  Apply CHG soap ONLY FROM YOUR NECK DOWN TO YOUR TOES (washing for 3-5 minutes)  DO NOT use CHG soap on face, private areas, open wounds, or sores.  Pay special attention to the area where your surgery is being performed.  If you are having back surgery,  having someone wash your back for you may be helpful. Wait 2 minutes after CHG soap is applied, then you may rinse off the CHG soap.  Pat dry with a clean towel  Put on clean clothes/pajamas   If you choose to wear lotion, please use ONLY the CHG-compatible lotions on the back of this paper.     Additional instructions for the day of surgery: DO NOT APPLY any lotions, deodorants, cologne, or perfumes.   Put on clean/comfortable clothes.  Brush your teeth.  Ask your nurse before applying any prescription medications to the skin.      CHG Compatible Lotions   Aveeno Moisturizing lotion  Cetaphil Moisturizing Cream  Cetaphil Moisturizing Lotion  Clairol Herbal Essence Moisturizing Lotion, Dry Skin  Clairol Herbal Essence Moisturizing Lotion, Extra Dry Skin  Clairol Herbal Essence Moisturizing Lotion, Normal Skin  Curel Age Defying Therapeutic Moisturizing Lotion with Alpha Hydroxy  Curel Extreme Care Body Lotion  Curel Soothing Hands Moisturizing Hand Lotion  Curel  Therapeutic Moisturizing Cream, Fragrance-Free  Curel Therapeutic Moisturizing Lotion, Fragrance-Free  Curel Therapeutic Moisturizing Lotion, Original Formula  Eucerin Daily Replenishing Lotion  Eucerin Dry Skin Therapy Plus Alpha Hydroxy Crme  Eucerin Dry Skin Therapy Plus Alpha Hydroxy Lotion  Eucerin Original Crme  Eucerin Original Lotion  Eucerin Plus Crme Eucerin Plus Lotion  Eucerin TriLipid Replenishing Lotion  Keri Anti-Bacterial Hand Lotion  Keri Deep Conditioning Original Lotion Dry Skin Formula Softly Scented  Keri Deep Conditioning Original Lotion, Fragrance Free Sensitive Skin Formula  Keri Lotion Fast Absorbing Fragrance Free Sensitive Skin Formula  Keri Lotion Fast Absorbing Softly Scented Dry Skin Formula  Keri Original Lotion  Keri Skin Renewal Lotion Keri Silky Smooth Lotion  Keri Silky Smooth Sensitive Skin Lotion  Nivea Body Creamy Conditioning Oil  Nivea Body Extra Enriched Landscape architect Moisturizing Lotion Nivea Crme  Nivea Skin Firming Lotion  NutraDerm 30 Skin Lotion  NutraDerm Skin Lotion  NutraDerm Therapeutic Skin Cream  NutraDerm Therapeutic Skin Lotion  ProShield Protective Hand Cream  Provon moisturizing lotion

## 2023-06-18 ENCOUNTER — Other Ambulatory Visit: Payer: Self-pay | Admitting: Family Medicine

## 2023-06-21 NOTE — Telephone Encounter (Signed)
Unable to refill per protocol, Rx expired. Discontinued 05/30/23. Requested Prescriptions  Pending Prescriptions Disp Refills   fluconazole (DIFLUCAN) 150 MG tablet [Pharmacy Med Name: FLUCONAZOLE 150MG  TABLETS] 2 tablet 0    Sig: TAKE 1 TABLET(150 MG) BY MOUTH 1 TIME FOR 1 DOSE. MAY REPEAT DOSE AFTER 24 HOURS IF NOT BETTER     Off-Protocol Failed - 06/18/2023 11:27 AM      Failed - Medication not assigned to a protocol, review manually.      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

## 2023-06-26 ENCOUNTER — Other Ambulatory Visit: Payer: Self-pay

## 2023-06-26 ENCOUNTER — Encounter
Admission: RE | Disposition: A | Discharge status: Home / Self Care | Payer: Self-pay | Source: Home / Self Care | Attending: Neurosurgery

## 2023-06-26 ENCOUNTER — Ambulatory Visit: Payer: BC Managed Care – PPO | Admitting: Certified Registered Nurse Anesthetist

## 2023-06-26 ENCOUNTER — Encounter: Payer: Self-pay | Admitting: Neurosurgery

## 2023-06-26 ENCOUNTER — Ambulatory Visit: Payer: BC Managed Care – PPO | Admitting: Urgent Care

## 2023-06-26 ENCOUNTER — Ambulatory Visit: Payer: BC Managed Care – PPO

## 2023-06-26 ENCOUNTER — Observation Stay
Admission: RE | Admit: 2023-06-26 | Discharge: 2023-06-29 | Disposition: A | Discharge status: Home / Self Care | Payer: BC Managed Care – PPO | Attending: Neurosurgery | Admitting: Neurosurgery

## 2023-06-26 DIAGNOSIS — D3610 Benign neoplasm of peripheral nerves and autonomic nervous system, unspecified: Principal | ICD-10-CM | POA: Diagnosis present

## 2023-06-26 DIAGNOSIS — Z23 Encounter for immunization: Secondary | ICD-10-CM

## 2023-06-26 DIAGNOSIS — N181 Chronic kidney disease, stage 1: Secondary | ICD-10-CM | POA: Diagnosis present

## 2023-06-26 DIAGNOSIS — F17210 Nicotine dependence, cigarettes, uncomplicated: Secondary | ICD-10-CM | POA: Diagnosis not present

## 2023-06-26 DIAGNOSIS — I252 Old myocardial infarction: Secondary | ICD-10-CM

## 2023-06-26 DIAGNOSIS — Z01818 Encounter for other preprocedural examination: Secondary | ICD-10-CM

## 2023-06-26 DIAGNOSIS — J45909 Unspecified asthma, uncomplicated: Secondary | ICD-10-CM | POA: Diagnosis not present

## 2023-06-26 DIAGNOSIS — J449 Chronic obstructive pulmonary disease, unspecified: Secondary | ICD-10-CM | POA: Diagnosis present

## 2023-06-26 DIAGNOSIS — E1122 Type 2 diabetes mellitus with diabetic chronic kidney disease: Secondary | ICD-10-CM | POA: Diagnosis not present

## 2023-06-26 DIAGNOSIS — Z79899 Other long term (current) drug therapy: Secondary | ICD-10-CM

## 2023-06-26 DIAGNOSIS — D497 Neoplasm of unspecified behavior of endocrine glands and other parts of nervous system: Secondary | ICD-10-CM | POA: Diagnosis not present

## 2023-06-26 DIAGNOSIS — Z791 Long term (current) use of non-steroidal anti-inflammatories (NSAID): Secondary | ICD-10-CM

## 2023-06-26 DIAGNOSIS — D3617 Benign neoplasm of peripheral nerves and autonomic nervous system of trunk, unspecified: Principal | ICD-10-CM | POA: Diagnosis present

## 2023-06-26 DIAGNOSIS — I2510 Atherosclerotic heart disease of native coronary artery without angina pectoris: Secondary | ICD-10-CM | POA: Diagnosis present

## 2023-06-26 DIAGNOSIS — Z7985 Long-term (current) use of injectable non-insulin antidiabetic drugs: Secondary | ICD-10-CM

## 2023-06-26 DIAGNOSIS — G9589 Other specified diseases of spinal cord: Principal | ICD-10-CM | POA: Diagnosis present

## 2023-06-26 DIAGNOSIS — Z833 Family history of diabetes mellitus: Secondary | ICD-10-CM

## 2023-06-26 DIAGNOSIS — Z8261 Family history of arthritis: Secondary | ICD-10-CM

## 2023-06-26 DIAGNOSIS — Z8249 Family history of ischemic heart disease and other diseases of the circulatory system: Secondary | ICD-10-CM

## 2023-06-26 DIAGNOSIS — F319 Bipolar disorder, unspecified: Secondary | ICD-10-CM | POA: Diagnosis present

## 2023-06-26 DIAGNOSIS — Z7951 Long term (current) use of inhaled steroids: Secondary | ICD-10-CM

## 2023-06-26 DIAGNOSIS — Z87442 Personal history of urinary calculi: Secondary | ICD-10-CM

## 2023-06-26 DIAGNOSIS — Z90710 Acquired absence of both cervix and uterus: Secondary | ICD-10-CM

## 2023-06-26 DIAGNOSIS — Z7984 Long term (current) use of oral hypoglycemic drugs: Secondary | ICD-10-CM

## 2023-06-26 DIAGNOSIS — I129 Hypertensive chronic kidney disease with stage 1 through stage 4 chronic kidney disease, or unspecified chronic kidney disease: Secondary | ICD-10-CM | POA: Diagnosis not present

## 2023-06-26 DIAGNOSIS — Z888 Allergy status to other drugs, medicaments and biological substances status: Secondary | ICD-10-CM

## 2023-06-26 DIAGNOSIS — N183 Chronic kidney disease, stage 3 unspecified: Secondary | ICD-10-CM | POA: Insufficient documentation

## 2023-06-26 DIAGNOSIS — G55 Nerve root and plexus compressions in diseases classified elsewhere: Secondary | ICD-10-CM | POA: Diagnosis present

## 2023-06-26 HISTORY — PX: LAMINECTOMY: SHX219

## 2023-06-26 LAB — GLUCOSE, CAPILLARY
Glucose-Capillary: 136 mg/dL — ABNORMAL HIGH (ref 70–99)
Glucose-Capillary: 207 mg/dL — ABNORMAL HIGH (ref 70–99)

## 2023-06-26 SURGERY — LUMBAR LAMINECTOMY FOR TUMOR
Anesthesia: General

## 2023-06-26 MED ORDER — OXYCODONE HCL 5 MG PO TABS
5.0000 mg | ORAL_TABLET | Freq: Once | ORAL | Status: AC | PRN
  Administered 2023-06-26: 5 mg via ORAL

## 2023-06-26 MED ORDER — METHYLPREDNISOLONE ACETATE 40 MG/ML IJ SUSP
INTRAMUSCULAR | Status: AC
  Filled 2023-06-26: qty 1

## 2023-06-26 MED ORDER — TRAZODONE HCL 50 MG PO TABS
150.0000 mg | ORAL_TABLET | Freq: Every day | ORAL | Status: DC
  Administered 2023-06-26 – 2023-06-28 (×3): 150 mg via ORAL
  Filled 2023-06-26 (×3): qty 1

## 2023-06-26 MED ORDER — SODIUM CHLORIDE 0.9 % IV SOLN: INTRAVENOUS | Status: DC

## 2023-06-26 MED ORDER — ONDANSETRON HCL 4 MG/2ML IJ SOLN
INTRAMUSCULAR | Status: AC
  Filled 2023-06-26: qty 2

## 2023-06-26 MED ORDER — SODIUM CHLORIDE 0.9 % IV SOLN: 250.0000 mL | INTRAVENOUS | Status: DC

## 2023-06-26 MED ORDER — ACETAMINOPHEN 650 MG RE SUPP: 650.0000 mg | RECTAL | Status: DC | PRN

## 2023-06-26 MED ORDER — CEFAZOLIN SODIUM-DEXTROSE 2-4 GM/100ML-% IV SOLN
2.0000 g | Freq: Once | INTRAVENOUS | Status: AC
  Administered 2023-06-26: 2 g via INTRAVENOUS

## 2023-06-26 MED ORDER — KETAMINE HCL 50 MG/5ML IJ SOSY
PREFILLED_SYRINGE | INTRAMUSCULAR | Status: AC
  Filled 2023-06-26: qty 5

## 2023-06-26 MED ORDER — INFLUENZA VIRUS VACC SPLIT PF (FLUZONE) 0.5 ML IM SUSY
0.5000 mL | PREFILLED_SYRINGE | INTRAMUSCULAR | Status: AC
  Administered 2023-06-27: 0.5 mL via INTRAMUSCULAR
  Filled 2023-06-26: qty 0.5

## 2023-06-26 MED ORDER — ACETAMINOPHEN 10 MG/ML IV SOLN
INTRAVENOUS | Status: DC | PRN
  Administered 2023-06-26: 1000 mg via INTRAVENOUS

## 2023-06-26 MED ORDER — FENTANYL CITRATE (PF) 100 MCG/2ML IJ SOLN
INTRAMUSCULAR | Status: AC
  Filled 2023-06-26: qty 2

## 2023-06-26 MED ORDER — PROPOFOL 1000 MG/100ML IV EMUL
INTRAVENOUS | Status: AC
  Filled 2023-06-26: qty 100

## 2023-06-26 MED ORDER — SODIUM CHLORIDE FLUSH 0.9 % IV SOLN
INTRAVENOUS | Status: AC
  Filled 2023-06-26: qty 20

## 2023-06-26 MED ORDER — PROPOFOL 500 MG/50ML IV EMUL
INTRAVENOUS | Status: DC | PRN
Start: 2023-06-26 — End: 2023-06-26
  Administered 2023-06-26: 150 ug/kg/min via INTRAVENOUS

## 2023-06-26 MED ORDER — POLYETHYLENE GLYCOL 3350 17 G PO PACK
17.0000 g | PACK | Freq: Every day | ORAL | Status: DC | PRN
  Administered 2023-06-28: 17 g via ORAL
  Filled 2023-06-26: qty 1

## 2023-06-26 MED ORDER — SURGIFLO WITH THROMBIN (HEMOSTATIC MATRIX KIT) OPTIME
TOPICAL | Status: DC | PRN
  Administered 2023-06-26: 1 via TOPICAL

## 2023-06-26 MED ORDER — ACETAMINOPHEN 10 MG/ML IV SOLN
INTRAVENOUS | Status: AC
  Filled 2023-06-26: qty 100

## 2023-06-26 MED ORDER — ONDANSETRON HCL 4 MG/2ML IJ SOLN: 4.0000 mg | Freq: Four times a day (QID) | INTRAMUSCULAR | Status: DC | PRN

## 2023-06-26 MED ORDER — FENTANYL CITRATE (PF) 100 MCG/2ML IJ SOLN
25.0000 ug | INTRAMUSCULAR | Status: DC | PRN
  Administered 2023-06-26 (×4): 25 ug via INTRAVENOUS

## 2023-06-26 MED ORDER — LISINOPRIL 5 MG PO TABS
5.0000 mg | ORAL_TABLET | Freq: Every day | ORAL | Status: DC
  Administered 2023-06-26 – 2023-06-29 (×4): 5 mg via ORAL
  Filled 2023-06-26 (×4): qty 1

## 2023-06-26 MED ORDER — ONDANSETRON HCL 4 MG/2ML IJ SOLN
INTRAMUSCULAR | Status: DC | PRN
  Administered 2023-06-26: 4 mg via INTRAVENOUS

## 2023-06-26 MED ORDER — CYCLOBENZAPRINE HCL 10 MG PO TABS
10.0000 mg | ORAL_TABLET | Freq: Three times a day (TID) | ORAL | Status: DC
  Administered 2023-06-26 – 2023-06-29 (×9): 10 mg via ORAL
  Filled 2023-06-26 (×9): qty 1

## 2023-06-26 MED ORDER — MIDAZOLAM HCL 2 MG/2ML IJ SOLN
INTRAMUSCULAR | Status: AC
  Filled 2023-06-26: qty 2

## 2023-06-26 MED ORDER — HYDROMORPHONE HCL 1 MG/ML IJ SOLN
INTRAMUSCULAR | Status: AC
  Filled 2023-06-26: qty 1

## 2023-06-26 MED ORDER — ONDANSETRON HCL 4 MG PO TABS: 4.0000 mg | ORAL_TABLET | Freq: Four times a day (QID) | ORAL | Status: DC | PRN

## 2023-06-26 MED ORDER — PROPOFOL 10 MG/ML IV BOLUS
INTRAVENOUS | Status: DC | PRN
  Administered 2023-06-26: 200 mg via INTRAVENOUS

## 2023-06-26 MED ORDER — IRRISEPT - 450ML BOTTLE WITH 0.05% CHG IN STERILE WATER, USP 99.95% OPTIME
TOPICAL | Status: DC | PRN
Start: 2023-06-26 — End: 2023-06-26
  Administered 2023-06-26: 450 mL

## 2023-06-26 MED ORDER — PROPOFOL 10 MG/ML IV BOLUS
INTRAVENOUS | Status: AC
  Filled 2023-06-26: qty 20

## 2023-06-26 MED ORDER — ORAL CARE MOUTH RINSE: 15.0000 mL | Freq: Once | OROMUCOSAL | Status: AC

## 2023-06-26 MED ORDER — CEFAZOLIN SODIUM-DEXTROSE 2-4 GM/100ML-% IV SOLN
INTRAVENOUS | Status: AC
  Filled 2023-06-26: qty 100

## 2023-06-26 MED ORDER — METOPROLOL SUCCINATE ER 25 MG PO TB24
12.5000 mg | ORAL_TABLET | Freq: Every day | ORAL | Status: DC
  Administered 2023-06-26 – 2023-06-29 (×4): 12.5 mg via ORAL
  Filled 2023-06-26 (×4): qty 1

## 2023-06-26 MED ORDER — LIDOCAINE HCL (PF) 2 % IJ SOLN
INTRAMUSCULAR | Status: AC
  Filled 2023-06-26: qty 5

## 2023-06-26 MED ORDER — DEXMEDETOMIDINE HCL IN NACL 80 MCG/20ML IV SOLN
INTRAVENOUS | Status: AC
  Filled 2023-06-26: qty 20

## 2023-06-26 MED ORDER — SENNA 8.6 MG PO TABS
1.0000 | ORAL_TABLET | Freq: Two times a day (BID) | ORAL | Status: DC
  Administered 2023-06-26 – 2023-06-29 (×6): 8.6 mg via ORAL
  Filled 2023-06-26 (×6): qty 1

## 2023-06-26 MED ORDER — MAGNESIUM CITRATE PO SOLN: 1.0000 | Freq: Once | ORAL | Status: DC | PRN

## 2023-06-26 MED ORDER — ENOXAPARIN SODIUM 40 MG/0.4ML IJ SOSY
40.0000 mg | PREFILLED_SYRINGE | INTRAMUSCULAR | Status: DC
  Administered 2023-06-27 – 2023-06-28 (×2): 40 mg via SUBCUTANEOUS
  Filled 2023-06-26 (×3): qty 0.4

## 2023-06-26 MED ORDER — FENTANYL CITRATE (PF) 100 MCG/2ML IJ SOLN
INTRAMUSCULAR | Status: DC | PRN
  Administered 2023-06-26: 100 ug via INTRAVENOUS

## 2023-06-26 MED ORDER — GABAPENTIN 300 MG PO CAPS
300.0000 mg | ORAL_CAPSULE | Freq: Two times a day (BID) | ORAL | Status: DC
  Administered 2023-06-26 – 2023-06-27 (×2): 300 mg via ORAL
  Filled 2023-06-26 (×2): qty 1

## 2023-06-26 MED ORDER — SODIUM CHLORIDE (PF) 0.9 % IJ SOLN: INTRAMUSCULAR | Status: DC | PRN

## 2023-06-26 MED ORDER — UMECLIDINIUM BROMIDE 62.5 MCG/ACT IN AEPB
1.0000 | INHALATION_SPRAY | Freq: Every day | RESPIRATORY_TRACT | Status: DC
  Administered 2023-06-27 – 2023-06-29 (×3): 1 via RESPIRATORY_TRACT
  Filled 2023-06-26: qty 7

## 2023-06-26 MED ORDER — DEXMEDETOMIDINE HCL IN NACL 80 MCG/20ML IV SOLN
INTRAVENOUS | Status: DC | PRN
Start: 2023-06-26 — End: 2023-06-26
  Administered 2023-06-26 (×3): 4 ug via INTRAVENOUS
  Administered 2023-06-26: 8 ug via INTRAVENOUS
  Administered 2023-06-26 (×2): 4 ug via INTRAVENOUS

## 2023-06-26 MED ORDER — HYDROMORPHONE HCL 1 MG/ML IJ SOLN
INTRAMUSCULAR | Status: DC | PRN
  Administered 2023-06-26: .5 mg via INTRAVENOUS

## 2023-06-26 MED ORDER — MORPHINE SULFATE (PF) 2 MG/ML IV SOLN
1.0000 mg | INTRAVENOUS | Status: AC | PRN
  Administered 2023-06-26 – 2023-06-27 (×2): 1 mg via INTRAVENOUS
  Filled 2023-06-26 (×3): qty 1

## 2023-06-26 MED ORDER — CHLORHEXIDINE GLUCONATE 0.12 % MT SOLN
15.0000 mL | Freq: Once | OROMUCOSAL | Status: AC
  Administered 2023-06-26: 15 mL via OROMUCOSAL

## 2023-06-26 MED ORDER — BUSPIRONE HCL 10 MG PO TABS
15.0000 mg | ORAL_TABLET | Freq: Three times a day (TID) | ORAL | Status: DC
  Administered 2023-06-26 – 2023-06-29 (×8): 15 mg via ORAL
  Filled 2023-06-26 (×8): qty 2

## 2023-06-26 MED ORDER — NITROGLYCERIN 0.4 MG SL SUBL: 0.4000 mg | SUBLINGUAL_TABLET | SUBLINGUAL | Status: DC | PRN

## 2023-06-26 MED ORDER — DOCUSATE SODIUM 100 MG PO CAPS
100.0000 mg | ORAL_CAPSULE | Freq: Two times a day (BID) | ORAL | Status: DC
  Administered 2023-06-26 – 2023-06-29 (×6): 100 mg via ORAL
  Filled 2023-06-26 (×6): qty 1

## 2023-06-26 MED ORDER — POTASSIUM 99 MG PO TABS: 1.0000 | ORAL_TABLET | Freq: Every morning | ORAL | Status: DC

## 2023-06-26 MED ORDER — LAMOTRIGINE 25 MG PO TABS
200.0000 mg | ORAL_TABLET | Freq: Every day | ORAL | Status: DC
  Administered 2023-06-26 – 2023-06-28 (×3): 200 mg via ORAL
  Filled 2023-06-26 (×3): qty 8

## 2023-06-26 MED ORDER — OXYCODONE HCL 5 MG PO TABS
ORAL_TABLET | ORAL | Status: AC
  Filled 2023-06-26: qty 1

## 2023-06-26 MED ORDER — ACETAMINOPHEN 500 MG PO TABS
1000.0000 mg | ORAL_TABLET | Freq: Four times a day (QID) | ORAL | Status: AC
  Administered 2023-06-27 (×3): 1000 mg via ORAL
  Filled 2023-06-26 (×4): qty 2

## 2023-06-26 MED ORDER — DEXAMETHASONE SODIUM PHOSPHATE 10 MG/ML IJ SOLN
INTRAMUSCULAR | Status: DC | PRN
  Administered 2023-06-26: 10 mg via INTRAVENOUS

## 2023-06-26 MED ORDER — DEXAMETHASONE SODIUM PHOSPHATE 10 MG/ML IJ SOLN
INTRAMUSCULAR | Status: AC
  Filled 2023-06-26: qty 1

## 2023-06-26 MED ORDER — BISACODYL 10 MG RE SUPP: 10.0000 mg | Freq: Every day | RECTAL | Status: DC | PRN

## 2023-06-26 MED ORDER — PANTOPRAZOLE SODIUM 40 MG PO TBEC
40.0000 mg | DELAYED_RELEASE_TABLET | Freq: Two times a day (BID) | ORAL | Status: DC
  Administered 2023-06-26 – 2023-06-29 (×6): 40 mg via ORAL
  Filled 2023-06-26 (×7): qty 1

## 2023-06-26 MED ORDER — ALBUTEROL SULFATE (2.5 MG/3ML) 0.083% IN NEBU: 2.5000 mg | INHALATION_SOLUTION | Freq: Four times a day (QID) | RESPIRATORY_TRACT | Status: DC | PRN

## 2023-06-26 MED ORDER — OXYCODONE HCL 5 MG PO TABS
10.0000 mg | ORAL_TABLET | ORAL | Status: DC | PRN
  Administered 2023-06-26 – 2023-06-28 (×8): 10 mg via ORAL
  Filled 2023-06-26 (×9): qty 2

## 2023-06-26 MED ORDER — KETOROLAC TROMETHAMINE 15 MG/ML IJ SOLN
15.0000 mg | Freq: Four times a day (QID) | INTRAMUSCULAR | Status: AC
  Administered 2023-06-26 – 2023-06-27 (×4): 15 mg via INTRAVENOUS
  Filled 2023-06-26 (×4): qty 1

## 2023-06-26 MED ORDER — LIDOCAINE HCL (CARDIAC) PF 100 MG/5ML IV SOSY
PREFILLED_SYRINGE | INTRAVENOUS | Status: DC | PRN
  Administered 2023-06-26: 100 mg via INTRAVENOUS

## 2023-06-26 MED ORDER — OXYCODONE HCL 5 MG/5ML PO SOLN: 5.0000 mg | Freq: Once | ORAL | Status: AC | PRN

## 2023-06-26 MED ORDER — SUCCINYLCHOLINE CHLORIDE 200 MG/10ML IV SOSY
PREFILLED_SYRINGE | INTRAVENOUS | Status: AC
  Filled 2023-06-26: qty 10

## 2023-06-26 MED ORDER — PHENOL 1.4 % MT LIQD: 1.0000 | OROMUCOSAL | Status: DC | PRN

## 2023-06-26 MED ORDER — 0.9 % SODIUM CHLORIDE (POUR BTL) OPTIME
TOPICAL | Status: DC | PRN
  Administered 2023-06-26: 500 mL

## 2023-06-26 MED ORDER — SODIUM CHLORIDE 0.9% FLUSH
3.0000 mL | Freq: Two times a day (BID) | INTRAVENOUS | Status: DC
  Administered 2023-06-27 – 2023-06-28 (×3): 3 mL via INTRAVENOUS

## 2023-06-26 MED ORDER — ACETAMINOPHEN 325 MG PO TABS: 650.0000 mg | ORAL_TABLET | ORAL | Status: DC | PRN

## 2023-06-26 MED ORDER — SUCCINYLCHOLINE CHLORIDE 200 MG/10ML IV SOSY
PREFILLED_SYRINGE | INTRAVENOUS | Status: DC | PRN
  Administered 2023-06-26: 120 mg via INTRAVENOUS

## 2023-06-26 MED ORDER — OXYCODONE HCL 5 MG PO TABS
5.0000 mg | ORAL_TABLET | ORAL | Status: DC | PRN
  Administered 2023-06-28: 5 mg via ORAL
  Filled 2023-06-26: qty 1

## 2023-06-26 MED ORDER — DIAZEPAM 5 MG PO TABS
5.0000 mg | ORAL_TABLET | Freq: Two times a day (BID) | ORAL | Status: DC
  Administered 2023-06-26 – 2023-06-29 (×7): 5 mg via ORAL
  Filled 2023-06-26 (×6): qty 1

## 2023-06-26 MED ORDER — MIDAZOLAM HCL 2 MG/2ML IJ SOLN
INTRAMUSCULAR | Status: DC | PRN
  Administered 2023-06-26: 2 mg via INTRAVENOUS

## 2023-06-26 MED ORDER — MONTELUKAST SODIUM 10 MG PO TABS
10.0000 mg | ORAL_TABLET | Freq: Every day | ORAL | Status: DC
  Administered 2023-06-26 – 2023-06-28 (×3): 10 mg via ORAL
  Filled 2023-06-26 (×3): qty 1

## 2023-06-26 MED ORDER — BUPIVACAINE-EPINEPHRINE (PF) 0.5% -1:200000 IJ SOLN
INTRAMUSCULAR | Status: DC | PRN
  Administered 2023-06-26: 10 mL

## 2023-06-26 MED ORDER — LORATADINE 10 MG PO TABS
10.0000 mg | ORAL_TABLET | Freq: Every day | ORAL | Status: DC
  Administered 2023-06-26 – 2023-06-29 (×4): 10 mg via ORAL
  Filled 2023-06-26 (×4): qty 1

## 2023-06-26 MED ORDER — SODIUM CHLORIDE 0.9% FLUSH: 3.0000 mL | INTRAVENOUS | Status: DC | PRN

## 2023-06-26 MED ORDER — BUPIVACAINE HCL (PF) 0.5 % IJ SOLN
INTRAMUSCULAR | Status: AC
  Filled 2023-06-26: qty 30

## 2023-06-26 MED ORDER — BUPIVACAINE LIPOSOME 1.3 % IJ SUSP
INTRAMUSCULAR | Status: AC
  Filled 2023-06-26: qty 20

## 2023-06-26 MED ORDER — SEMAGLUTIDE 14 MG PO TABS: 14.0000 mg | ORAL_TABLET | Freq: Every day | ORAL | Status: DC

## 2023-06-26 MED ORDER — FIBRIN SEALANT 2 ML SINGLE DOSE KIT
PACK | CUTANEOUS | Status: DC | PRN
Start: 2023-06-26 — End: 2023-06-26
  Administered 2023-06-26: 2 mL via TOPICAL

## 2023-06-26 MED ORDER — EMPAGLIFLOZIN 25 MG PO TABS
25.0000 mg | ORAL_TABLET | Freq: Every day | ORAL | Status: DC
  Administered 2023-06-27 – 2023-06-29 (×3): 25 mg via ORAL
  Filled 2023-06-26 (×3): qty 1

## 2023-06-26 MED ORDER — FLUTICASONE FUROATE-VILANTEROL 100-25 MCG/ACT IN AEPB
1.0000 | INHALATION_SPRAY | Freq: Every day | RESPIRATORY_TRACT | Status: DC
  Administered 2023-06-27 – 2023-06-29 (×3): 1 via RESPIRATORY_TRACT
  Filled 2023-06-26: qty 28

## 2023-06-26 MED ORDER — MENTHOL 3 MG MT LOZG: 1.0000 | LOZENGE | OROMUCOSAL | Status: DC | PRN

## 2023-06-26 MED ORDER — METFORMIN HCL 500 MG PO TABS
500.0000 mg | ORAL_TABLET | Freq: Two times a day (BID) | ORAL | Status: DC
  Administered 2023-06-27 – 2023-06-29 (×4): 500 mg via ORAL
  Filled 2023-06-26 (×6): qty 1

## 2023-06-26 MED ORDER — DIAZEPAM 5 MG PO TABS
ORAL_TABLET | ORAL | Status: AC
  Filled 2023-06-26: qty 1

## 2023-06-26 MED ORDER — CHLORHEXIDINE GLUCONATE 0.12 % MT SOLN
OROMUCOSAL | Status: AC
  Filled 2023-06-26: qty 15

## 2023-06-26 MED ORDER — KETAMINE HCL 10 MG/ML IJ SOLN
INTRAMUSCULAR | Status: DC | PRN
Start: 2023-06-26 — End: 2023-06-26
  Administered 2023-06-26: 20 mg via INTRAVENOUS
  Administered 2023-06-26 (×3): 10 mg via INTRAVENOUS

## 2023-06-26 SURGICAL SUPPLY — 47 items
ADH SKN CLS APL DERMABOND .7 (GAUZE/BANDAGES/DRESSINGS)
AGENT HMST KT MTR STRL THRMB (HEMOSTASIS) ×1
BASIN KIT SINGLE STR (MISCELLANEOUS) ×1 IMPLANT
BUR NEURO DRILL SOFT 3.0X3.8M (BURR) ×1 IMPLANT
CNTNR URN SCR LID CUP LEK RST (MISCELLANEOUS) ×1 IMPLANT
CONT SPEC 4OZ STRL OR WHT (MISCELLANEOUS) ×1
DERMABOND ADVANCED .7 DNX12 (GAUZE/BANDAGES/DRESSINGS) ×1 IMPLANT
DRAPE C ARM PK CFD 31 SPINE (DRAPES) ×1 IMPLANT
DRAPE LAPAROTOMY 100X77 ABD (DRAPES) ×1 IMPLANT
DRAPE MICROSCOPE SPINE 48X150 (DRAPES) IMPLANT
DRSG OPSITE POSTOP 4X8 (GAUZE/BANDAGES/DRESSINGS) IMPLANT
ELECT EZSTD 165MM 6.5IN (MISCELLANEOUS) ×1
ELECT REM PT RETURN 9FT ADLT (ELECTROSURGICAL) ×1
ELECTRODE EZSTD 165MM 6.5IN (MISCELLANEOUS) ×1 IMPLANT
ELECTRODE REM PT RTRN 9FT ADLT (ELECTROSURGICAL) ×1 IMPLANT
GLOVE BIOGEL PI IND STRL 6.5 (GLOVE) ×1 IMPLANT
GLOVE SURG SYN 6.5 ES PF (GLOVE) ×1 IMPLANT
GLOVE SURG SYN 6.5 PF PI (GLOVE) ×1 IMPLANT
GLOVE SURG SYN 8.5 E (GLOVE) ×3 IMPLANT
GLOVE SURG SYN 8.5 PF PI (GLOVE) ×3 IMPLANT
GOWN SRG LRG LVL 4 IMPRV REINF (GOWNS) ×1 IMPLANT
GOWN SRG XL LVL 3 NONREINFORCE (GOWNS) ×1 IMPLANT
GOWN STRL NON-REIN TWL XL LVL3 (GOWNS) ×1
GOWN STRL REIN LRG LVL4 (GOWNS) ×1
GRAFT DURAGEN MATRIX 1WX1L (Tissue) IMPLANT
KIT SPINAL PRONEVIEW (KITS) ×1 IMPLANT
MANIFOLD NEPTUNE II (INSTRUMENTS) ×1 IMPLANT
MARKER SKIN DUAL TIP RULER LAB (MISCELLANEOUS) ×1 IMPLANT
NDL SAFETY ECLIP 18X1.5 (MISCELLANEOUS) ×1 IMPLANT
NS IRRIG 1000ML POUR BTL (IV SOLUTION) ×1 IMPLANT
PACK LAMINECTOMY ARMC (PACKS) ×1 IMPLANT
PATTIES SURGICAL .25X.25 (GAUZE/BANDAGES/DRESSINGS) IMPLANT
PROBE MONO 100X0.75X1.9 NCS (MISCELLANEOUS) IMPLANT
SURGIFLO W/THROMBIN 8M KIT (HEMOSTASIS) ×1 IMPLANT
SUT DVC VLOC 3-0 CL 6 P-12 (SUTURE) ×1 IMPLANT
SUT ETHILON 3-0 FS-10 30 BLK (SUTURE) ×1
SUT GORETEX 6.0 TT9 (SUTURE) IMPLANT
SUT NURALON 4 0 TR CR/8 (SUTURE) IMPLANT
SUT VIC AB 0 CT1 27 (SUTURE) ×1
SUT VIC AB 0 CT1 27XCR 8 STRN (SUTURE) ×1 IMPLANT
SUT VIC AB 2-0 CT1 18 (SUTURE) ×1 IMPLANT
SUTURE EHLN 3-0 FS-10 30 BLK (SUTURE) IMPLANT
SYR 30ML LL (SYRINGE) ×2 IMPLANT
SYR 3ML LL SCALE MARK (SYRINGE) ×1 IMPLANT
TRAP FLUID SMOKE EVACUATOR (MISCELLANEOUS) ×1 IMPLANT
TRAY FOLEY SLVR 16FR LF STAT (SET/KITS/TRAYS/PACK) IMPLANT
WATER STERILE IRR 1000ML POUR (IV SOLUTION) ×2 IMPLANT

## 2023-06-26 NOTE — Transfer of Care (Signed)
Immediate Anesthesia Transfer of Care Note  Patient: [name]  Procedure(s) Performed: L1-2 LAMINECTOMY FOR TUMOR RESECTION  Patient Location: PACU  Anesthesia Type:General  Level of Consciousness: awake and drowsy  Airway & Oxygen Therapy: Patient Spontanous Breathing and Patient connected to face mask oxygen  Post-op Assessment: Report given to RN and Post -op Vital signs reviewed and stable  Post vital signs: Reviewed and stable  Last Vitals:  Vitals Value Taken Time  BP 103/59 06/26/23 1600  Temp 35.8 1600  Pulse 82 06/26/23 1608  Resp 16 06/26/23 1608  SpO2 95 % 06/26/23 1608  Vitals shown include unfiled device data.  Last Pain:  Vitals:   06/26/23 1104  TempSrc: Temporal  PainSc: 4          Complications: No notable events documented.

## 2023-06-26 NOTE — Plan of Care (Signed)
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
## 2023-06-26 NOTE — Anesthesia Postprocedure Evaluation (Signed)
Anesthesia Post Note  Patient: [name]  Procedure(s) Performed: L1-2 LAMINECTOMY FOR TUMOR RESECTION  Patient location during evaluation: PACU Anesthesia Type: General Level of consciousness: awake and alert, oriented and patient cooperative Pain management: pain level controlled Vital Signs Assessment: post-procedure vital signs reviewed and stable Respiratory status: spontaneous breathing, nonlabored ventilation and respiratory function stable Cardiovascular status: blood pressure returned to baseline and stable Postop Assessment: adequate PO intake Anesthetic complications: no   No notable events documented.   Last Vitals:  Vitals:   06/26/23 1645 06/26/23 1700  BP: 115/65 127/71  Pulse: 79 81  Resp: 10 14  Temp:    SpO2: 93% 95%    Last Pain:  Vitals:   06/26/23 1700  TempSrc:   PainSc: 3                  Reed Breech

## 2023-06-26 NOTE — Anesthesia Procedure Notes (Signed)
Procedure Name: Intubation Date/Time: 06/26/2023 12:33 PM  Performed by: Emeterio Reeve, CRNAPre-anesthesia Checklist: Patient identified, Emergency Drugs available, Suction available and Patient being monitored Patient Re-evaluated:Patient Re-evaluated prior to induction Oxygen Delivery Method: Circle system utilized Preoxygenation: Pre-oxygenation with 100% oxygen Induction Type: IV induction Ventilation: Mask ventilation without difficulty Laryngoscope Size: Mac and 4 Grade View: Grade I Tube type: Oral Tube size: 7.0 mm Number of attempts: 1 Airway Equipment and Method: Stylet and Oral airway Placement Confirmation: ETT inserted through vocal cords under direct vision, positive ETCO2 and breath sounds checked- equal and bilateral Secured at: 22 cm Tube secured with: Tape Dental Injury: Teeth and Oropharynx as per pre-operative assessment  Comments: Cords clear; no trauma. CA

## 2023-06-26 NOTE — Interval H&P Note (Signed)
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  The various methods of treatment have been discussed with the patient and family. After consideration of risks, benefits and other options for treatment, the patient has consented to  Procedure(s): L1-2 LAMINECTOMY FOR TUMOR RESECTION (N/A) as a surgical intervention.  The patient's history has been reviewed, patient examined, no change in status, stable for surgery.  I have reviewed the patient's chart and labs.  Questions were answered to the patient's satisfaction.    Heart sounds normal no MRG. Chest Clear to Auscultation Bilaterally.   [name]

## 2023-06-27 ENCOUNTER — Encounter: Payer: Self-pay | Admitting: Neurosurgery

## 2023-06-27 DIAGNOSIS — I129 Hypertensive chronic kidney disease with stage 1 through stage 4 chronic kidney disease, or unspecified chronic kidney disease: Secondary | ICD-10-CM | POA: Diagnosis present

## 2023-06-27 DIAGNOSIS — Z23 Encounter for immunization: Secondary | ICD-10-CM | POA: Diagnosis not present

## 2023-06-27 DIAGNOSIS — Z8249 Family history of ischemic heart disease and other diseases of the circulatory system: Secondary | ICD-10-CM | POA: Diagnosis not present

## 2023-06-27 DIAGNOSIS — F319 Bipolar disorder, unspecified: Secondary | ICD-10-CM | POA: Diagnosis present

## 2023-06-27 DIAGNOSIS — J45909 Unspecified asthma, uncomplicated: Secondary | ICD-10-CM | POA: Diagnosis present

## 2023-06-27 DIAGNOSIS — N181 Chronic kidney disease, stage 1: Secondary | ICD-10-CM | POA: Diagnosis present

## 2023-06-27 DIAGNOSIS — Z833 Family history of diabetes mellitus: Secondary | ICD-10-CM | POA: Diagnosis not present

## 2023-06-27 DIAGNOSIS — E1122 Type 2 diabetes mellitus with diabetic chronic kidney disease: Secondary | ICD-10-CM | POA: Diagnosis present

## 2023-06-27 DIAGNOSIS — Z888 Allergy status to other drugs, medicaments and biological substances status: Secondary | ICD-10-CM | POA: Diagnosis not present

## 2023-06-27 DIAGNOSIS — Z87442 Personal history of urinary calculi: Secondary | ICD-10-CM | POA: Diagnosis not present

## 2023-06-27 DIAGNOSIS — D492 Neoplasm of unspecified behavior of bone, soft tissue, and skin: Secondary | ICD-10-CM | POA: Diagnosis present

## 2023-06-27 DIAGNOSIS — J449 Chronic obstructive pulmonary disease, unspecified: Secondary | ICD-10-CM | POA: Diagnosis present

## 2023-06-27 DIAGNOSIS — Z7985 Long-term (current) use of injectable non-insulin antidiabetic drugs: Secondary | ICD-10-CM | POA: Diagnosis not present

## 2023-06-27 DIAGNOSIS — Z8261 Family history of arthritis: Secondary | ICD-10-CM | POA: Diagnosis not present

## 2023-06-27 DIAGNOSIS — Z79899 Other long term (current) drug therapy: Secondary | ICD-10-CM | POA: Diagnosis not present

## 2023-06-27 DIAGNOSIS — I2510 Atherosclerotic heart disease of native coronary artery without angina pectoris: Secondary | ICD-10-CM | POA: Diagnosis present

## 2023-06-27 DIAGNOSIS — Z791 Long term (current) use of non-steroidal anti-inflammatories (NSAID): Secondary | ICD-10-CM | POA: Diagnosis not present

## 2023-06-27 DIAGNOSIS — F17210 Nicotine dependence, cigarettes, uncomplicated: Secondary | ICD-10-CM | POA: Diagnosis present

## 2023-06-27 DIAGNOSIS — Z7984 Long term (current) use of oral hypoglycemic drugs: Secondary | ICD-10-CM | POA: Diagnosis not present

## 2023-06-27 DIAGNOSIS — D3610 Benign neoplasm of peripheral nerves and autonomic nervous system, unspecified: Secondary | ICD-10-CM | POA: Diagnosis not present

## 2023-06-27 DIAGNOSIS — D497 Neoplasm of unspecified behavior of endocrine glands and other parts of nervous system: Secondary | ICD-10-CM

## 2023-06-27 DIAGNOSIS — I252 Old myocardial infarction: Secondary | ICD-10-CM | POA: Diagnosis not present

## 2023-06-27 DIAGNOSIS — Z90710 Acquired absence of both cervix and uterus: Secondary | ICD-10-CM | POA: Diagnosis not present

## 2023-06-27 MED ORDER — CELECOXIB 200 MG PO CAPS
200.0000 mg | ORAL_CAPSULE | Freq: Two times a day (BID) | ORAL | Status: DC
  Administered 2023-06-27 – 2023-06-29 (×4): 200 mg via ORAL
  Filled 2023-06-27 (×4): qty 1

## 2023-06-27 MED ORDER — GABAPENTIN 300 MG PO CAPS
300.0000 mg | ORAL_CAPSULE | Freq: Three times a day (TID) | ORAL | Status: DC
  Administered 2023-06-27 – 2023-06-29 (×5): 300 mg via ORAL
  Filled 2023-06-27 (×5): qty 1

## 2023-06-27 MED ORDER — CHLORHEXIDINE GLUCONATE CLOTH 2 % EX PADS
6.0000 | MEDICATED_PAD | Freq: Every day | CUTANEOUS | Status: DC
  Administered 2023-06-27 – 2023-06-28 (×2): 6 via TOPICAL

## 2023-06-27 MED ORDER — ACETAMINOPHEN 500 MG PO TABS
1000.0000 mg | ORAL_TABLET | Freq: Four times a day (QID) | ORAL | Status: DC
  Administered 2023-06-27 – 2023-06-29 (×5): 1000 mg via ORAL
  Filled 2023-06-27 (×6): qty 2

## 2023-06-27 MED ORDER — MORPHINE SULFATE (PF) 2 MG/ML IV SOLN
1.0000 mg | INTRAVENOUS | Status: AC | PRN
  Administered 2023-06-27 – 2023-06-28 (×4): 1 mg via INTRAVENOUS
  Filled 2023-06-27 (×4): qty 1

## 2023-06-27 NOTE — Op Note (Signed)
Indications: 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  Due to ongoing symptoms, surgical resection was recommended.     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   Preoperative Note:   Risks of surgery discussed include: infection, bleeding, stroke, coma, death, paralysis, CSF leak, nerve/spinal cord injury, numbness, tingling, weakness, complex regional pain syndrome, recurrent stenosis and/or disc herniation, vascular injury, development of instability, neck/back pain, need for further surgery, persistent symptoms, development of deformity, and the risks of anesthesia. The patient understood these risks and agreed to proceed.  Operative Note:  1.  L1 and L2 laminectomies for resection of intradural extramedullary spinal cord tumor  2.  Use of microscope for microdissection 3.  Use of fluoroscopy   The patient was brought to the Operating Room, intubated and turned into the prone position. All pressure points were checked and double checked. Flouroscopy was used to mark the incision. The patient was prepped and draped in the standard fashion. A full timeout was performed. Preoperative antibiotics were given. The incision was injected with local anesthetic.  The incision was opened with a scalpel, then the soft tissues divided with the Bovie. Self-retaining retractors were placed. The paraspinus muscles were reflected laterally in subperiosteal fashion until the facet capsules were identifiable.  Fluoroscopy was used to confirm the localization.  The spinous process of L1 and L2 were then removed.  The high-speed drill was used to remove the lamina of L1 and the superior two thirds of the lamina at  L2.  Bone wax was used for hemostasis on the bone edges.  At this point we brought the operating microscope into the field.    Using microsurgical technique, the dura was opened in linear fashion in the midline.  4-0 Nurolon sutures were used to tack back the dura.  The tumor was visibly identified.  The rectum was opened using microsurgical tools.  Spinal fluid was removed to allow for dissection and mobilization of the lesion.  The lesion was noted to be adherent to a sensory nerve root.  The nerve roots surrounding the lesion were stimulated and noted to not have any activation of the sphincter muscles or the lower extremities.  As such, identification of the sensory nerve root was confirmed.  Using a microdissectors, the tumor was carefully removed from the nerve root along the plane of attachment.  It was then handed off in a single piece for pathologic examination.  After the tumor had been removed, the intradural area was carefully inspected and irrigated.  Confirmation of hemostasis was noted.  The dura was then closed in running fashion with Gore-Tex suture.  A Valsalva was performed and noted to have no leakage.  DuraGen and Vistaseal were used to augment closure.  We then removed our self-retaining retractors and copiously irrigated the soft tissues.  Hemostasis was achieved.  The soft tissue layers were then closed in running continuous fashion using 0 and 2-0 Vicryl.  A running 3-0 nylon was placed on the skin.  Sterile bandage was applied.  The patient was handed back to anesthesia.  The patient was then flipped supine and extubated with incident. All counts were correct times 2 at the end of the case. No immediate complications were noted.  [name] Charity PA assisted  in the entire procedure. An assistant was required for this procedure due to the complexity.  The assistant provided assistance in tissue manipulation and suction, and was required for the successful and safe performance of  the procedure. I performed the critical portions of the procedure.   Venetia Night MD

## 2023-06-27 NOTE — Progress Notes (Signed)
   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  POD1: Expected pain overnight.  Is on bedrest.  No changes in sensation or strength.  Physical Exam: Vitals:   06/27/23 0010 06/27/23 0452  BP: 124/74 135/78  Pulse: 82 78  Resp: 16 16  Temp: 97.6 F (36.4 C) 98.1 F (36.7 C)  SpO2: 96% 98%    AA Ox3 CNI  Strength:5/5 throughout BLE SILT BLE Reports perineal sensation normal  Data:  Other tests/results: Pathology pending  Assessment/Plan:  [name] is doing well after L1-2 laminectomy for tumor resection.  - Flat bedrest - pain control - DVT prophylaxis - PTOT to assess tomorrow after she is on bedrest -Continue Foley catheter while on bedrest -Okay to reposition of the bed however she would like as long as the bed remains flat.   Venetia Night MD, [facility] Department of Neurosurgery

## 2023-06-27 NOTE — Progress Notes (Signed)
   06/27/23 1500  Spiritual Encounters  Type of Visit Initial  Care provided to: Pt and family  Conversation partners present during encounter Nurse  Referral source Patient request  Reason for visit Advance directives  OnCall Visit No  Spiritual Framework  Presenting Themes Meaning/purpose/sources of inspiration;Community and relationships  Patient Stress Factors Major life changes  Family Stress Factors Not reviewed  Interventions  Spiritual Care Interventions Made Established relationship of care and support;Compassionate presence  Intervention Outcomes  Outcomes Reduced fear;Reduced anxiety;Awareness of support  Spiritual Care Plan  Spiritual Care Issues Still Outstanding No further spiritual care needs at this time (see row info)   Patient request AD and we provided patient with information. AD was complete and place in the Patient's file and a copy giving to the PT. Let patient know if they need to talk Chaplain is around 24/7

## 2023-06-27 NOTE — Plan of Care (Signed)
  Problem: Education: Goal: Ability to verbalize activity precautions or restrictions will improve Outcome: Progressing Goal: Understanding of discharge needs will improve Outcome: Progressing   Problem: Activity: Goal: Ability to avoid complications of mobility impairment will improve Outcome: Progressing

## 2023-06-28 DIAGNOSIS — D3610 Benign neoplasm of peripheral nerves and autonomic nervous system, unspecified: Secondary | ICD-10-CM | POA: Diagnosis not present

## 2023-06-28 LAB — SURGICAL PATHOLOGY

## 2023-06-28 NOTE — Progress Notes (Signed)
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  POD2: NAEO. Pain better controlled  POD1: Expected pain overnight.  Is on bedrest.  No changes in sensation or strength.  Physical Exam: Vitals:   06/27/23 1743 06/28/23 0044  BP: (!) 152/57 (!) 147/75  Pulse: 92 81  Resp: 18 16  Temp: 97.6 F (36.4 C) 98.1 F (36.7 C)  SpO2: 99% 93%    AA Ox3 CNI  Strength:5/5 throughout BLE  Data:  Other tests/results: Pathology pending  Assessment/Plan:  [name] is doing well after L1-2 laminectomy for tumor resection.  - Flat bedrest in 9am this morning - pain control - DVT prophylaxis - PTOT to start at 9am once bedrest lifted -Continue Foley catheter while on bedrest -Okay to reposition of the bed however she would like as long as the bed remains flat.  Manning Charity PA-C Department of Neurosurgery

## 2023-06-28 NOTE — Evaluation (Signed)
Occupational Therapy Evaluation Patient Details Name: [name] MRN: [id number] DOB: 04-02-1976 Today's Date: 06/28/2023   History of Present Illness admitted for acute hospitalization s/p L1-2 laminectomy for tumor resection (06/27/23)   Clinical Impression   Patient presenting with decreased Ind in self care,balance, functional mobility/transfers, endurance, and safety awareness. Patient reports being Ind at baseline and living with spouse and home and working full time as a Runner, broadcasting/film/video. OT reviewed back precautions with pt in regards to self care tasks. Use of log roll with mod A for bed mobility to come to EOB. Total A to bring LEs into figure four position to don B socks. Pt stands and takes several steps with sink for standing grooming tasks with min guard for balance.  Pt arrives and pt transitions to PT evaluation without issue. Patient will benefit from acute OT to increase overall independence in the areas of ADLs, functional mobility, and safety awareness in order to safely discharge.      If plan is discharge home, recommend the following: A little help with walking and/or transfers;A little help with bathing/dressing/bathroom;Assistance with cooking/housework;Assist for transportation    Functional Status Assessment  Patient has had a recent decline in their functional status and demonstrates the ability to make significant improvements in function in a reasonable and predictable amount of time.  Equipment Recommendations  BSC/3in1;Other (comment) (RW)       Precautions / Restrictions Precautions Precautions: Fall;Back Restrictions Weight Bearing Restrictions: No      Mobility Bed Mobility Overal bed mobility: Needs Assistance Bed Mobility: Supine to Sit     Supine to sit: Mod assist          Transfers Overall transfer level: Needs assistance Equipment used: Rolling walker (2 wheels), 1 person hand held assist Transfers: Sit to/from Stand Sit to  Stand: Contact guard assist, Min assist                  Balance Overall balance assessment: Needs assistance Sitting-balance support: No upper extremity supported, Feet supported Sitting balance-Leahy Scale: Good     Standing balance support: Bilateral upper extremity supported Standing balance-Leahy Scale: Fair                             ADL either performed or assessed with clinical judgement   ADL Overall ADL's : Needs assistance/impaired               Lower Body Bathing Details (indicate cue type and reason): pt needing assistance for figure four position and assist to don B socks         Toilet Transfer: Minimal assistance Toilet Transfer Details (indicate cue type and reason): min HHA simulated                 Vision Patient Visual Report: No change from baseline              Pertinent Vitals/Pain Pain Assessment Pain Assessment: 0-10 Pain Score: 6  Pain Location: back Pain Descriptors / Indicators: Aching, Grimacing, Guarding Pain Intervention(s): Limited activity within patient's tolerance, Monitored during session, Repositioned, Premedicated before session     Extremity/Trunk Assessment Upper Extremity Assessment Upper Extremity Assessment: Overall WFL for tasks assessed   Lower Extremity Assessment Lower Extremity Assessment: Generalized weakness       Communication     Cognition Arousal: Alert Behavior During Therapy: WFL for tasks assessed/performed Overall Cognitive Status: Within Functional Limits for tasks assessed  Home Living Family/patient expects to be discharged to:: Private residence Living Arrangements: Spouse/significant other Available Help at Discharge: Family;Available 24 hours/day Type of Home: House Home Access: Level entry     Home Layout: One level     Bathroom Shower/Tub: Tub/shower unit         Home Equipment:  None          Prior Functioning/Environment Prior Level of Function : Independent/Modified Independent             Mobility Comments: Indep with ADLs, household and community mobilization without assist device; full-time teacher in adaptive education ADLs Comments: Pt is Ind in self care and IADLs and working full time as Runner, broadcasting/film/video.        OT Problem List: Decreased strength;Decreased activity tolerance;Decreased safety awareness;Impaired balance (sitting and/or standing);Decreased knowledge of use of DME or AE;Pain;Decreased knowledge of precautions      OT Treatment/Interventions: Self-care/ADL training;Therapeutic exercise;Therapeutic activities;Energy conservation;DME and/or AE instruction;Patient/family education;Balance training    OT Goals(Current goals can be found in the care plan section) Acute Rehab OT Goals Patient Stated Goal: to go home and decrease pain OT Goal Formulation: With patient Time For Goal Achievement: 07/12/23 Potential to Achieve Goals: Fair ADL Goals Pt Will Perform Grooming: with modified independence;standing Pt Will Perform Lower Body Dressing: with modified independence;sit to/from stand;with adaptive equipment Pt Will Transfer to Toilet: with modified independence;ambulating Pt Will Perform Toileting - Clothing Manipulation and hygiene: with modified independence;sit to/from stand  OT Frequency: Min 1X/week       AM-PAC OT "6 Clicks" Daily Activity     Outcome Measure Help from another person eating meals?: None Help from another person taking care of personal grooming?: A Little Help from another person toileting, which includes using toliet, bedpan, or urinal?: A Little Help from another person bathing (including washing, rinsing, drying)?: A Little Help from another person to put on and taking off regular upper body clothing?: A Little Help from another person to put on and taking off regular lower body clothing?: A Little 6 Click Score:  19   End of Session Equipment Utilized During Treatment: Rolling walker (2 wheels) Nurse Communication: Mobility status  Activity Tolerance: Patient tolerated treatment well Patient left: in bed;with call bell/phone within reach;with bed alarm set  OT Visit Diagnosis: Unsteadiness on feet (R26.81);Muscle weakness (generalized) (M62.81)                Time: 6578-4696 OT Time Calculation (min): 23 min Charges:  OT General Charges $OT Visit: 1 Visit OT Evaluation $OT Eval Moderate Complexity: 1 Mod OT Treatments $Self Care/Home Management : 8-22 mins  Jackquline Denmark, MS, OTR/L , CBIS ascom [phone]  06/28/23, 12:55 PM

## 2023-06-28 NOTE — Discharge Instructions (Signed)
  Your surgeon has performed an operation on your lumbar spine (low back) to relieve pressure on one or more nerves. Many times, patients feel better immediately after surgery and can "overdo it." Even if you feel well, it is important that you follow these activity guidelines. The following are instructions to help in your recovery once you have been discharged from the hospital.  * It is ok to take NSAIDs after surgery.  Activity    No bending, lifting, or twisting ("BLT"). Avoid lifting objects heavier than 10 pounds (gallon milk jug).  Where possible, avoid household activities that involve lifting, bending, pushing, or pulling such as laundry, vacuuming, grocery shopping, and childcare. Try to arrange for help from friends and family for these activities while your back heals.  Increase physical activity slowly as tolerated.  Taking short walks is encouraged, but avoid strenuous exercise. Do not jog, run, bicycle, lift weights, or participate in any other exercises unless specifically allowed by your doctor. Avoid prolonged sitting, including car rides.  Talk to your doctor before resuming sexual activity.  You should not drive until cleared by your doctor.  Until released by your doctor, you should not return to work or school.  You should rest at home and let your body heal.   You may shower three days after your surgery.  After showering, lightly dab your incision dry. Do not take a tub bath or go swimming for 3 weeks, or until approved by your doctor at your follow-up appointment.  If you smoke, we strongly recommend that you quit.  Smoking has been proven to interfere with normal healing in your back and will dramatically reduce the success rate of your surgery. Please contact [facility] (800-QUIT-NOW) and use the resources at www.[facility].com for assistance in stopping smoking.  Surgical Incision   Please remove your dressing on July 01, 2023  Diet            You may  return to your usual diet. Be sure to stay hydrated.  When to Contact us  Although your surgery and recovery will likely be uneventful, you may have some residual numbness, aches, and pains in your back and/or legs. This is normal and should improve in the next few weeks.  However, should you experience any of the following, contact us immediately: New numbness or weakness Pain that is progressively getting worse, and is not relieved by your pain medications or rest Bleeding, redness, swelling, pain, or drainage from surgical incision Chills or flu-like symptoms Fever greater than 101.0 F (38.3 C) Problems with bowel or bladder functions Difficulty breathing or shortness of breath Warmth, tenderness, or swelling in your calf  Contact Information How to contact us:  If you have any questions/concerns before or after surgery, you can reach [place] at [phone], or you can send a mychart message. We can be reached by phone or mychart 8am-4pm, Monday-Friday.  *Please note: Calls after 4pm are forwarded to a third party answering service. Mychart messages are not routinely monitored during evenings, weekends, and holidays. Please call our office to contact the answering service for urgent concerns during non-business hours.

## 2023-06-28 NOTE — Plan of Care (Signed)
  Problem: Education: Goal: Ability to verbalize activity precautions or restrictions will improve Outcome: Progressing Goal: Knowledge of the prescribed therapeutic regimen will improve Outcome: Progressing Goal: Understanding of discharge needs will improve Outcome: Progressing   

## 2023-06-28 NOTE — Evaluation (Signed)
Physical Therapy Evaluation Patient Details Name: [name] MRN: [id number] DOB: [birth year] Today's Date: 06/28/2023  History of Present Illness  admitted for acute hospitalization s/p L1-2 laminectomy for tumor resection (06/27/23)  Clinical Impression  Patient standing at sink (just completed OT evaluation) with OT upon arrival to room; alert and oriented, follows commands and eager for continued mobility progression.  Slightly guarded with all functional tasks and movements, but does relax with cuing and overall comfort/confidence in abilities.  Rates pain in back at 6/10; improved with OOB activities and transition to upright.  Bilat LEs grossly symmetrical and WFL for basic transfers and gait; no focal weakness, sensory or radicular symptoms appreciated.  Able to complete sit/stand, basic transfers and gait (20') with RW, cga/min assist.  Demonstrates good postural extension, but limited trunk rotation/arm swing; generally guarded posturing.  Limited hip, knee and ankle flexion throughout gait cycle due to pain (R > L), anticipate improvement as comfort/confidence in movement improves Would benefit from skilled PT to address above deficits and promote optimal return to PLOF.; recommend post-acute PT follow up as indicated by interdisciplinary care team.           If plan is discharge home, recommend the following: A little help with walking and/or transfers;A little help with bathing/dressing/bathroom   Can travel by private vehicle        Equipment Recommendations Rolling walker (2 wheels);BSC/3in1  Recommendations for Other Services       Functional Status Assessment Patient has had a recent decline in their functional status and demonstrates the ability to make significant improvements in function in a reasonable and predictable amount of time.     Precautions / Restrictions Precautions Precautions: Fall;Back Restrictions Weight Bearing Restrictions: No       Mobility  Bed Mobility               General bed mobility comments: standing at sink with OT upon arrival to room; positioned to comfort in recliner end of session    Transfers Overall transfer level: Needs assistance Equipment used: Rolling walker (2 wheels) Transfers: Sit to/from Stand Sit to Stand: Contact guard assist                Ambulation/Gait Ambulation/Gait assistance: Contact guard assist Gait Distance (Feet): 20 Feet Assistive device: Rolling walker (2 wheels)         General Gait Details: good postural extension, but limited trunk rotation/arm swing; generally guarded posturing.  Limited hip, knee and ankle flexion throughout gait cycle due to pain (R > L), anticipate improvement as comfort/confidence in movement improves  Stairs            Wheelchair Mobility     Tilt Bed    Modified Rankin (Stroke Patients Only)       Balance Overall balance assessment: Needs assistance Sitting-balance support: No upper extremity supported, Feet supported Sitting balance-Leahy Scale: Good     Standing balance support: Bilateral upper extremity supported Standing balance-Leahy Scale: Fair                               Pertinent Vitals/Pain Pain Assessment Pain Assessment: 0-10 Pain Score: 6  Pain Location: back Pain Descriptors / Indicators: Aching, Grimacing, Guarding Pain Intervention(s): Limited activity within patient's tolerance, Monitored during session, Repositioned    Home Living Family/patient expects to be discharged to:: Private residence Living Arrangements: Spouse/significant other Available Help at Discharge: Family Type of Home:  House Home Access: Level entry       Home Layout: One level Home Equipment: None      Prior Function Prior Level of Function : Independent/Modified Independent             Mobility Comments: Indep with ADLs, household and community mobilization without assist device;  full-time teacher in adaptive education       Extremity/Trunk Assessment   Upper Extremity Assessment Upper Extremity Assessment: Overall WFL for tasks assessed    Lower Extremity Assessment Lower Extremity Assessment: Generalized weakness (grossly at least 4/5 throughout; generally guarded with slow, deliberate movement due to pain.  Denies radicular symptoms)       Communication      Cognition Arousal: Alert Behavior During Therapy: WFL for tasks assessed/performed Overall Cognitive Status: Within Functional Limits for tasks assessed                                          General Comments      Exercises     Assessment/Plan    PT Assessment Patient needs continued PT services  PT Problem List Decreased strength;Decreased range of motion;Decreased activity tolerance;Decreased balance;Decreased mobility;Decreased coordination;Decreased knowledge of use of DME;Decreased safety awareness;Decreased knowledge of precautions;Pain       PT Treatment Interventions DME instruction;Gait training;Stair training;Functional mobility training;Therapeutic activities;Therapeutic exercise;Balance training;Patient/family education    PT Goals (Current goals can be found in the Care Plan section)  Acute Rehab PT Goals Patient Stated Goal: to return home PT Goal Formulation: With patient Time For Goal Achievement: 07/12/23 Potential to Achieve Goals: Good    Frequency 7X/week     Co-evaluation               AM-PAC PT "6 Clicks" Mobility  Outcome Measure Help needed turning from your back to your side while in a flat bed without using bedrails?: A Little Help needed moving from lying on your back to sitting on the side of a flat bed without using bedrails?: A Little Help needed moving to and from a bed to a chair (including a wheelchair)?: A Little Help needed standing up from a chair using your arms (e.g., wheelchair or bedside chair)?: A Little Help  needed to walk in hospital room?: A Little Help needed climbing 3-5 steps with a railing? : A Little 6 Click Score: 18    End of Session Equipment Utilized During Treatment: Gait belt Activity Tolerance: Patient tolerated treatment well Patient left: in chair;with call bell/phone within reach;with chair alarm set;with family/visitor present Nurse Communication: Mobility status PT Visit Diagnosis: Muscle weakness (generalized) (M62.81);Difficulty in walking, not elsewhere classified (R26.2);Other symptoms and signs involving the nervous system (R29.898)    Time: 1041-1100 PT Time Calculation (min) (ACUTE ONLY): 19 min   Charges:   PT Evaluation $PT Eval Moderate Complexity: 1 Mod   PT General Charges $$ ACUTE PT VISIT: 1 Visit         Kaiyon Hynes H. Manson Passey, PT, DPT, NCS 06/28/23, 11:20 AM [phone]

## 2023-06-29 DIAGNOSIS — D3610 Benign neoplasm of peripheral nerves and autonomic nervous system, unspecified: Secondary | ICD-10-CM | POA: Diagnosis not present

## 2023-06-29 MED ORDER — CYCLOBENZAPRINE HCL 10 MG PO TABS: 10.0000 mg | ORAL_TABLET | Freq: Three times a day (TID) | ORAL | 1 refills | Status: DC | PRN

## 2023-06-29 MED ORDER — OXYCODONE HCL 5 MG PO TABS: 5.0000 mg | ORAL_TABLET | ORAL | 0 refills | Status: DC | PRN

## 2023-06-29 MED ORDER — SENNA 8.6 MG PO TABS: 1.0000 | ORAL_TABLET | Freq: Two times a day (BID) | ORAL | 0 refills | Status: DC

## 2023-06-29 MED ORDER — DOCUSATE SODIUM 100 MG PO CAPS: 100.0000 mg | ORAL_CAPSULE | Freq: Two times a day (BID) | ORAL | 0 refills | Status: DC

## 2023-06-29 NOTE — Progress Notes (Signed)
Physical Therapy Treatment Patient Details Name: [name] MRN: [id number] DOB: 05-19-1976 Today's Date: 06/29/2023   History of Present Illness admitted for acute hospitalization s/p L1-2 laminectomy for tumor resection (06/27/23)    PT Comments  Pt was side lying in bed upon arrival. A and O x 4. Cooperative and pleasant. " Pain is much better today. I hope to go home." Pt demonstrated safe abilities to exit bed, stand, and ambulate without physical assistance. Overall tolerated session well. Safely performed stairs without difficulty. Acute PT will continue to follow. Pt did report having rides to OPPT if surgeon prefers DC directly to OPPT for post acute PT.    If plan is discharge home, recommend the following: Assist for transportation     Equipment Recommendations  None recommended by PT (pt endorses already ordering RW/BSC.All  DME needs are met)       Precautions / Restrictions Precautions Precautions: Fall;Back Precaution Booklet Issued: Yes (comment) Restrictions Weight Bearing Restrictions: No     Mobility  Bed Mobility Overal bed mobility: Needs Assistance Bed Mobility: Supine to Sit, Sidelying to Sit Sidelying to sit: Modified independent (Device/Increase time) Supine to sit: Modified independent (Device/Increase time)  General bed mobility comments: pt was able to achieve EOB sitting without physical assistance or vcs. Pt independently dressed at EOB without assistance    Transfers Overall transfer level: Modified independent Equipment used: Rolling walker (2 wheels) Transfers: Sit to/from Stand Sit to Stand: Modified independent (Device/Increase time)  General transfer comment: pt was able to stand without physical assistance or vcs.    Ambulation/Gait Ambulation/Gait assistance: Modified independent (Device/Increase time) Gait Distance (Feet): 200 Feet Assistive device: Rolling walker (2 wheels), None Gait Pattern/deviations: Step-through  pattern, Narrow base of support  General Gait Details: Pt demonstrated safe abilities to ambulate >200 ft with RW. did perform minimal ambulation in room without AD. encouraged continued uise of RW until post acute PT clears for release.   Stairs Stairs: Yes Stairs assistance: Modified independent (Device/Increase time) Stair Management: One rail Right, Forwards, Alternating pattern Number of Stairs: 4     Balance Overall balance assessment: Needs assistance Sitting-balance support: No upper extremity supported, Feet supported Sitting balance-Leahy Scale: Good  Standing balance support: Bilateral upper extremity supported Standing balance-Leahy Scale: Fair     Cognition Arousal: Alert Behavior During Therapy: WFL for tasks assessed/performed Overall Cognitive Status: Within Functional Limits for tasks assessed    General Comments: Pt is A and O x 4               Pertinent Vitals/Pain Pain Assessment Pain Assessment: 0-10 Pain Score: 2  Pain Location: incision Pain Descriptors / Indicators: Aching, Grimacing, Guarding Pain Intervention(s): Limited activity within patient's tolerance, Monitored during session, Repositioned     PT Goals (current goals can now be found in the care plan section) Acute Rehab PT Goals Patient Stated Goal: to return home Progress towards PT goals: Progressing toward goals    Frequency    7X/week       AM-PAC PT "6 Clicks" Mobility   Outcome Measure  Help needed turning from your back to your side while in a flat bed without using bedrails?: None Help needed moving from lying on your back to sitting on the side of a flat bed without using bedrails?: None Help needed moving to and from a bed to a chair (including a wheelchair)?: None Help needed standing up from a chair using your arms (e.g., wheelchair or bedside chair)?: None Help  needed to walk in hospital room?: None Help needed climbing 3-5 steps with a railing? : None 6 Click  Score: 24    End of Session   Activity Tolerance: Patient tolerated treatment well Patient left: Other (comment) (pt requested to remain standing in room. RN staff made aware pt is moving safe/well enough to independently ambulate unit) Nurse Communication: Mobility status PT Visit Diagnosis: Muscle weakness (generalized) (M62.81);Difficulty in walking, not elsewhere classified (R26.2);Other symptoms and signs involving the nervous system (R29.898)     Time: 1610-9604 PT Time Calculation (min) (ACUTE ONLY): 23 min  Charges:    $Gait Training: 8-22 mins $Therapeutic Activity: 8-22 mins PT General Charges $$ ACUTE PT VISIT: 1 Visit                     Jetta Lout PTA 06/29/23, 8:01 AM

## 2023-06-29 NOTE — Discharge Summary (Signed)
Physician Discharge Summary  Patient ID: [name] MRN: [id number] DOB/AGE: [birth year] 47 y.o.  Admit date: 06/26/2023 Discharge date: 06/29/2023  Admission Diagnoses:  Intradural extramedullary spinal tumor  Discharge Diagnoses:  Principal Problem:   Intradural mass [facility]) Active Problems:   Intradural extramedullary spinal tumor   Discharged Condition: good  Hospital Course:  Mrs. [name] presented with an intradural lesion consistent with an intradural intramedullary spinal cord lesion.  Due to worsening symptoms, surgery was recommended.  She underwent surgical resection and was on bedrest for 2 days.  She then mobilized and was stable for discharge on postoperative day 3.  Consults: None  Significant Diagnostic Studies: radiology: X-Ray: localization  Treatments: surgery: L1-2 laminectomy for tumor resection  Discharge Exam: Blood pressure 130/75, pulse (!) 101, temperature 98 F (36.7 C), resp. rate 17, height 6' (1.829 m), weight 109.8 kg, last menstrual period 07/13/2020, SpO2 96%. General appearance: alert and cooperative CNI MAEW 5/5  Disposition: Discharge disposition: 01-Home or Self Care       Discharge Instructions     Discharge patient   Complete by: As directed    Discharge disposition: 01-Home or Self Care   Discharge patient date: 06/29/2023   Incentive spirometry RT   Complete by: As directed       Allergies as of 06/29/2023       Reactions   Mounjaro [tirzepatide] Diarrhea   Ozempic (0.25 Or 0.5 Mg-dose) [semaglutide(0.25 Or 0.5mg -dos)] Diarrhea        Medication List     TAKE these medications    albuterol 108 (90 Base) MCG/ACT inhaler Commonly known as: VENTOLIN HFA Inhale 2 puffs into the lungs every 6 (six) hours as needed for wheezing or shortness of breath.   blood glucose meter kit and supplies Kit Dispense based on patient and insurance preference. Use up to four times daily as directed. (FOR ICD-9  250.00, 250.01).   busPIRone 15 MG tablet Commonly known as: BUSPAR Take 15 mg by mouth 3 (three) times daily.   cetirizine 10 MG tablet Commonly known as: ZYRTEC TAKE 1 TABLET(10 MG) BY MOUTH DAILY   Cyanocobalamin 3000 MCG Caps Take 6,000 mcg by mouth daily. Gummie   cyclobenzaprine 10 MG tablet Commonly known as: FLEXERIL Take 1 tablet (10 mg total) by mouth 3 (three) times daily as needed for muscle spasms. What changed: See the new instructions.   diazepam 5 MG tablet Commonly known as: VALIUM Take 5 mg by mouth 2 (two) times daily.   docusate sodium 100 MG capsule Commonly known as: COLACE Take 1 capsule (100 mg total) by mouth 2 (two) times daily.   empagliflozin 25 MG Tabs tablet Commonly known as: Jardiance Take 1 tablet (25 mg total) by mouth daily before breakfast.   gabapentin 300 MG capsule Commonly known as: NEURONTIN Take 300 mg by mouth 2 (two) times daily.   lamoTRIgine 100 MG tablet Commonly known as: LAMICTAL Take 200 mg by mouth at bedtime.   lisinopril 5 MG tablet Commonly known as: ZESTRIL TAKE 1 TABLET(5 MG) BY MOUTH DAILY   metFORMIN 500 MG tablet Commonly known as: GLUCOPHAGE TAKE 1 TABLET(500 MG) BY MOUTH TWICE DAILY WITH A MEAL   metoprolol succinate 25 MG 24 hr tablet Commonly known as: TOPROL-XL TAKE 1/2 TO 1 TABLET(12.5 TO 25 MG) BY MOUTH DAILY   montelukast 10 MG tablet Commonly known as: SINGULAIR TAKE 1 TABLET(10 MG) BY MOUTH AT BEDTIME   multivitamin tablet Take 1 tablet by mouth daily.  naproxen 500 MG tablet Commonly known as: NAPROSYN TAKE 1 TABLET(500 MG) BY MOUTH TWICE DAILY WITH A MEAL   nitroGLYCERIN 0.4 MG SL tablet Commonly known as: NITROSTAT Place 1 tablet (0.4 mg total) under the tongue every 5 (five) minutes as needed for chest pain.   oxyCODONE 5 MG immediate release tablet Commonly known as: Oxy IR/ROXICODONE Take 1 tablet (5 mg total) by mouth every 4 (four) hours as needed for moderate pain ((score  4 to 6)).   pantoprazole 40 MG tablet Commonly known as: PROTONIX TAKE 1 TABLET(40 MG) BY MOUTH TWICE DAILY   Potassium 99 MG Tabs Take 1 tablet by mouth every morning.   Rybelsus 14 MG Tabs Generic drug: Semaglutide Take 1 tablet (14 mg total) by mouth daily.   senna 8.6 MG Tabs tablet Commonly known as: SENOKOT Take 1 tablet (8.6 mg total) by mouth 2 (two) times daily.   traZODone 150 MG tablet Commonly known as: DESYREL TAKE 1 TABLET BY MOUTH AT BEDTIME   Trelegy Ellipta 100-62.5-25 MCG/ACT Aepb Generic drug: Fluticasone-Umeclidin-Vilant INHALE 1 PUFF INTO THE LUNGS DAILY   triamcinolone ointment 0.5 % Commonly known as: KENALOG Apply 1 Application topically 2 (two) times daily.        Follow-up Information     Susanne Borders, PA Follow up on 07/11/2023.   Specialty: Neurosurgery Contact information: [address] [place] [address] [phone]                 Signed: Venetia Night 06/29/2023, 8:21 AM

## 2023-06-29 NOTE — Progress Notes (Signed)
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  POD3: Doing better.  Passing gas POD2: NAEO. Pain better controlled  POD1: Expected pain overnight.  Is on bedrest.  No changes in sensation or strength.  Physical Exam: Vitals:   06/28/23 2223 06/29/23 0815  BP: (!) 107/56 130/75  Pulse: 99 (!) 101  Resp: 17 17  Temp:  98 F (36.7 C)  SpO2: 94% 96%    AA Ox3 CNI  Strength:5/5 throughout BLE  Data:  Other tests/results: Pathology pending  Assessment/Plan:  [name] is doing well after L1-2 laminectomy for tumor resection.  - Flat bedrest in 9am this morning - pain control - DVT prophylaxis   Venetia Night MD Department of Neurosurgery

## 2023-06-29 NOTE — Plan of Care (Signed)
  Problem: Activity: Goal: Ability to avoid complications of mobility impairment will improve Outcome: Progressing Goal: Ability to tolerate increased activity will improve Outcome: Progressing   Problem: Pain Management: Goal: Pain level will decrease Outcome: Progressing   Problem: Health Behavior/Discharge Planning: Goal: Ability to manage health-related needs will improve Outcome: Progressing   Problem: Clinical Measurements: Goal: Cardiovascular complication will be avoided Outcome: Progressing   Problem: Activity: Goal: Risk for activity intolerance will decrease Outcome: Progressing   Problem: Nutrition: Goal: Adequate nutrition will be maintained Outcome: Progressing   Problem: Coping: Goal: Level of anxiety will decrease Outcome: Progressing   Problem: Elimination: Goal: Will not experience complications related to bowel motility Outcome: Progressing Goal: Will not experience complications related to urinary retention Outcome: Progressing   Problem: Pain Managment: Goal: General experience of comfort will improve Outcome: Progressing   Problem: Safety: Goal: Ability to remain free from injury will improve Outcome: Progressing

## 2023-06-29 NOTE — TOC Progression Note (Signed)
Transition of Care [facility]) - Progression Note    Patient Details  Name: [name] MRN: [id number] Date of Birth: Jul 26, 1976  Transition of Care [facility]) CM/SW Contact  Marlowe Sax, RN Phone Number: 06/29/2023, 9:07 AM  Clinical Narrative:     Met with the patient at the bedside, she has support at home with her spouse and mother and aunt She has ordered all the equipment that she needs such as a Rolling walker shower seat, BSC thru [employer] and it will be delivered today, her spouse will pick her up today  Expected Discharge Plan: Home/Self Care Barriers to Discharge: No Barriers Identified  Expected Discharge Plan and Services   Discharge Planning Services: CM Consult   Living arrangements for the past 2 months: Single Family Home Expected Discharge Date: 06/29/23                 DME Agency: NA       HH Arranged: NA           Social Determinants of Health (SDOH) Interventions SDOH Screenings   Food Insecurity: No Food Insecurity (06/26/2023)  Recent Concern: Food Insecurity - Food Insecurity Present (04/06/2023)  Housing: Low Risk  (06/26/2023)  Transportation Needs: No Transportation Needs (06/26/2023)  Utilities: Not At Risk (06/26/2023)  Alcohol Screen: Low Risk  (04/06/2023)  Depression (PHQ2-9): High Risk (09/24/2021)  Financial Resource Strain: Medium Risk (04/06/2023)  Physical Activity: Insufficiently Active (04/06/2023)  Social Connections: Socially Isolated (04/06/2023)  Stress: Stress Concern Present (04/06/2023)  Tobacco Use: High Risk (06/26/2023)  Health Literacy: Adequate Health Literacy (06/09/2023)    Readmission Risk Interventions     No data to display

## 2023-06-29 NOTE — Plan of Care (Signed)

## 2023-07-02 ENCOUNTER — Other Ambulatory Visit: Payer: Self-pay | Admitting: Family Medicine

## 2023-07-03 ENCOUNTER — Telehealth: Payer: Self-pay | Admitting: Neurosurgery

## 2023-07-03 NOTE — Telephone Encounter (Signed)
Spoke with [name] regarding her pathology results.  She has a WHO grade 1 schwannoma.  She will not need any adjuvant treatment.  We will set up a baseline MRI scan in approximately 3 months.

## 2023-07-04 NOTE — Telephone Encounter (Signed)
Requested by interface surescripts. Medication discontinued 05/30/23.  Requested Prescriptions  Refused Prescriptions Disp Refills   fluconazole (DIFLUCAN) 150 MG tablet [Pharmacy Med Name: FLUCONAZOLE 150MG  TABLETS] 2 tablet 2    Sig: TAKE 1 TABLET(150 MG) BY MOUTH DAILY. MAY REPEAT AFTER 24 HOURS IF NOT BETTER     Off-Protocol Failed - 07/02/2023  8:52 PM      Failed - Medication not assigned to a protocol, review manually.      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

## 2023-07-06 ENCOUNTER — Telehealth: Payer: Self-pay | Admitting: Neurosurgery

## 2023-07-06 NOTE — Telephone Encounter (Signed)
Patient has been notified of instructions she agreed.

## 2023-07-06 NOTE — Telephone Encounter (Signed)
L1-2 laminectomy for tumor resection on 06/26/23  She was discharged on 9/12 at what time they put a new bandage over her incision. She has not taken that bandage off but its coming off on it's own.  Can she take the bandage off and place a new one over the incision?

## 2023-07-08 ENCOUNTER — Other Ambulatory Visit: Payer: Self-pay | Admitting: Family Medicine

## 2023-07-10 ENCOUNTER — Ambulatory Visit: Payer: BC Managed Care – PPO | Admitting: Family Medicine

## 2023-07-10 NOTE — Telephone Encounter (Signed)
Requested Prescriptions  Pending Prescriptions Disp Refills   naproxen (NAPROSYN) 500 MG tablet [Pharmacy Med Name: NAPROXEN 500MG  TABLETS] 180 tablet 0    Sig: TAKE 1 TABLET(500 MG) BY MOUTH TWICE DAILY WITH A MEAL     Analgesics:  NSAIDS Failed - 07/08/2023  8:01 AM      Failed - Manual Review: Labs are only required if the patient has taken medication for more than 8 weeks.      Passed - Cr in normal range and within 360 days    Creatinine  Date Value Ref Range Status  04/09/2014 0.89 0.60 - 1.30 mg/dL Final   Creatinine, Ser  Date Value Ref Range Status  06/09/2023 0.69 0.57 - 1.00 mg/dL Final         Passed - HGB in normal range and within 360 days    Hemoglobin  Date Value Ref Range Status  06/09/2023 14.0 11.1 - 15.9 g/dL Final         Passed - PLT in normal range and within 360 days    Platelets  Date Value Ref Range Status  06/09/2023 201 150 - 450 x10E3/uL Final         Passed - HCT in normal range and within 360 days    Hematocrit  Date Value Ref Range Status  06/09/2023 42.9 34.0 - 46.6 % Final         Passed - eGFR is 30 or above and within 360 days    EGFR (African American)  Date Value Ref Range Status  04/09/2014 >60  Final   GFR calc Af Amer  Date Value Ref Range Status  11/26/2020 104 >59 mL/min/1.73 Final    Comment:    **In accordance with recommendations from the NKF-ASN Task force,**   [facility] is in the process of updating its eGFR calculation to the   2021 CKD-EPI creatinine equation that estimates kidney function   without a race variable.    EGFR (Non-African Amer.)  Date Value Ref Range Status  04/09/2014 >60  Final    Comment:    eGFR values <69mL/min/1.73 m2 may be an indication of chronic kidney disease (CKD). Calculated eGFR is useful in patients with stable renal function. The eGFR calculation will not be reliable in acutely ill patients when serum creatinine is changing rapidly. It is not useful in  patients on dialysis. The  eGFR calculation may not be applicable to patients at the low and high extremes of body sizes, pregnant women, and vegetarians.    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, without long-term current use of insulin (HCC)   [facility], Megan P, DO   6 months ago 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, without long-term current use of insulin (HCC)   [facility] [place], [place] P, DO   7 months ago Upper respiratory tract infection, unspecified type   [facility] [place], Megan P, DO   9 months ago Benign hypertensive renal disease   [place] [facility] Dorcas Carrow, DO       Future Appointments             In 2 months Laural Benes, Oralia Rud, DO  [facility], [facility]

## 2023-07-11 ENCOUNTER — Encounter: Payer: Self-pay | Admitting: Neurosurgery

## 2023-07-11 ENCOUNTER — Ambulatory Visit (INDEPENDENT_AMBULATORY_CARE_PROVIDER_SITE_OTHER): Payer: BC Managed Care – PPO | Admitting: Neurosurgery

## 2023-07-11 VITALS — BP 118/82 | Temp 97.8°F | Ht 72.0 in | Wt 228.0 lb

## 2023-07-11 DIAGNOSIS — R42 Dizziness and giddiness: Secondary | ICD-10-CM

## 2023-07-11 DIAGNOSIS — D497 Neoplasm of unspecified behavior of endocrine glands and other parts of nervous system: Secondary | ICD-10-CM

## 2023-07-11 DIAGNOSIS — Z09 Encounter for follow-up examination after completed treatment for conditions other than malignant neoplasm: Secondary | ICD-10-CM

## 2023-07-11 LAB — HM DIABETES EYE EXAM

## 2023-07-11 MED ORDER — OXYCODONE HCL 5 MG PO TABS: 5.0000 mg | ORAL_TABLET | Freq: Two times a day (BID) | ORAL | 0 refills | Status: AC | PRN

## 2023-07-11 NOTE — Progress Notes (Signed)
   REFERRING PHYSICIAN:  Rosario Adie [address] [place],  [place] [address]  DOS: 06/26/23 L1-2 laminectomy for tumor resection  Pathology: Schwannoma  HISTORY OF PRESENT ILLNESS: [name] is approximately 2 weeks status post lumbar tumor resection. she is fair after surgery.  While her lower extremity symptoms have resolved, she has had several episodes of dizziness when getting up from a seated position.  She denies any headaches, drainage from her incision, or focal weakness.  She continues to take oxycodone once at night as well as Tylenol as needed.  PHYSICAL EXAMINATION:  General: Patient is well developed, well nourished, calm, collected, and in no apparent distress.   NEUROLOGICAL:  General: In no acute distress.   Awake, alert, oriented to person, place, and time.  Pupils equal round and reactive to light.    Strength:            Side Iliopsoas Quads Hamstring PF DF EHL  R 5 5 5 5 5 5   L 5 5 5 5 5 5    Incision c/d/I and healing well   ROS (Neurologic):  Negative except as noted above  IMAGING: No interval imaging to review   ASSESSMENT/PLAN:  [name] is doing fairly well approximately 2 weeks after schwannoma resection.  Her episodes of dizziness are not related to her recent surgery however have persisted since her hospital stay.  I will order labs and an EKG as well as send a message to her primary care provider.  Discussed activity escalation and I have advised the patient to lift up to 10 pounds until 6 weeks after surgery, then increase up to 25 pounds until 12 weeks after surgery.  After 12 weeks post-op, the patient advised to increase activity as tolerated. she will follow up with Dr. Myer Haff in 4 weeks.  Advised to contact the office if any questions or concerns arise.  Manning Charity PA-C Department of neurosurgery

## 2023-07-12 ENCOUNTER — Encounter: Payer: Self-pay | Admitting: Family Medicine

## 2023-07-13 ENCOUNTER — Encounter: Payer: BC Managed Care – PPO | Admitting: Orthopedic Surgery

## 2023-07-14 ENCOUNTER — Other Ambulatory Visit: Payer: Self-pay | Admitting: Family Medicine

## 2023-07-14 NOTE — Telephone Encounter (Signed)
Copied from CRM [id number]. Topic: General - Other >> Jul 14, 2023 11:34 AM Everette C wrote: Reason for CRM: Medication Refill - Medication: Semaglutide (RYBELSUS) 14 MG TABS [[id number]] - the patient will need their medication to take by 07/15/23  Has the patient contacted their pharmacy? Yes.   (Agent: If no, request that the patient contact the pharmacy for the refill. If patient does not wish to contact the pharmacy document the reason why and proceed with request.) (Agent: If yes, when and what did the pharmacy advise?)  Preferred Pharmacy (with phone number or street name): [id number] [place], [place] - [address] AT [facility] OF [address] [place] [address] Phone: [phone] Fax: [phone] Hours: Not open 24 hours  Has the patient been seen for an appointment in the last year OR does the patient have an upcoming appointment? Yes.    Agent: Please be advised that RX refills may take up to 3 business days. We ask that you follow-up with your pharmacy.

## 2023-07-14 NOTE — Telephone Encounter (Signed)
Requested medication (s) are due for refill today:   No  Requesting it too soon  Requested medication (s) are on the active medication list:   Yes  Future visit scheduled:   Yes 11/25   Last ordered: 04/06/2023 #90, 1 refill  Returned because no protocol assigned to this med.   Requested Prescriptions  Pending Prescriptions Disp Refills   Semaglutide (RYBELSUS) 14 MG TABS 90 tablet 1    Sig: Take 1 tablet (14 mg total) by mouth daily.     Off-Protocol Failed - 07/14/2023 12:19 PM      Failed - Medication not assigned to a protocol, review manually.      Passed - Valid encounter within last 12 months    Recent Outpatient Visits           1 month ago Preop exam for internal medicine   [facility] [place], [place] P, DO   3 months ago 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, without long-term current use of insulin (HCC)   [facility], Megan P, DO   7 months ago 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, without long-term current use of insulin (HCC)   [facility] [place], Megan P, DO   7 months ago Upper respiratory tract infection, unspecified type   [place] [place], Megan P, DO   10 months ago Benign hypertensive renal disease   [facility] Dorcas Carrow, DO       Future Appointments             In 1 month [name], Oralia Rud, DO [facility], [facility]

## 2023-07-17 ENCOUNTER — Encounter: Payer: Self-pay | Admitting: Neurosurgery

## 2023-07-17 DIAGNOSIS — D497 Neoplasm of unspecified behavior of endocrine glands and other parts of nervous system: Secondary | ICD-10-CM

## 2023-07-17 DIAGNOSIS — Z09 Encounter for follow-up examination after completed treatment for conditions other than malignant neoplasm: Secondary | ICD-10-CM

## 2023-07-17 MED ORDER — CEPHALEXIN 500 MG PO CAPS
500.0000 mg | ORAL_CAPSULE | Freq: Four times a day (QID) | ORAL | 0 refills | Status: DC
Start: 2023-07-17 — End: 2023-07-20

## 2023-07-17 NOTE — Telephone Encounter (Signed)
Pictures reviewed. With the redness and drainage, I recommend she start keflex x 7 days and also come to pick up medi honey. Can put on incision daily with dry dressing. Keep incision clean and dry.   Spoke with patient and discussed above. She is in agreement. Her mother in law can pick up medi honey today.   She will f/u with me on Thursday for incision check.

## 2023-07-17 NOTE — Telephone Encounter (Signed)
DOS 06/26/23 L1-2 laminectomy for tumor resection

## 2023-07-17 NOTE — Addendum Note (Signed)
Addended byDrake Leach on: 07/17/2023 01:18 PM   Modules accepted: Orders

## 2023-07-19 NOTE — Progress Notes (Unsigned)
   REFERRING PHYSICIAN:  Rosario [name] [address] [place],  [place] [address]  DOS: 06/26/23 L1-2 laminectomy for tumor resection  Pathology: Schwannoma  HISTORY OF PRESENT ILLNESS: [name] is here for a wound check. She had some redness and drainage from her incision earlier this week.   She was started on keflex and was to start medi honey.   She has mild LBP. No leg pain. No weakness in her legs. She has been changing the dressing tid. She still noticed some drainage yesterday.   She continues with episodes of dizziness.   She is not taking oxycodone. She is taking prn robaxin. She is taking the keflex.   PHYSICAL EXAMINATION:  General: Patient is well developed, well nourished, calm, collected, and in no apparent distress.   NEUROLOGICAL:  General: In no acute distress.   Awake, alert, oriented to person, place, and time.  Pupils equal round and reactive to light.    Strength:         Side Iliopsoas Quads Hamstring PF DF EHL  R 5 5 5 5 5 5   L 5 5 5 5 5 5    Incision- see picture below.   No drainage noted. Has some superficial opening of incision at the middle.     ROS (Neurologic):  Negative except as noted above  IMAGING: No interval imaging to review   ASSESSMENT/PLAN:  [name] is doing fairly well approximately 2 weeks after schwannoma resection.  Not having anymore drainage from incision. She continues with episodes of dizziness.   Treatment options reviewed with Dr. Myer Haff and following plan made with patient:   - She will continue with medi honey and dry dressing changes. Keep incision clean and dry.  - Continue on keflex. She was given another refill for 7 days.  - Given diflucan as well at her request.  - No bending, twisting, or lifting.  - Spoke with Duwayne Heck- she can go to [facility] for labs. She ordered a CBC and BMP. EKG ordered as well.  Duwayne Heck sent a message to her PCP about the dizziness, but has not heard  back. Will let patient know to reach out to her.  - Follow up with me for incision check on 08/01/23, but will call if she needs to be seen earlier.   Advised to contact the office if any questions or concerns arise.  Drake Leach PA-C Department of neurosurgery

## 2023-07-20 ENCOUNTER — Ambulatory Visit (INDEPENDENT_AMBULATORY_CARE_PROVIDER_SITE_OTHER): Payer: BC Managed Care – PPO | Admitting: Orthopedic Surgery

## 2023-07-20 ENCOUNTER — Encounter: Payer: Self-pay | Admitting: Orthopedic Surgery

## 2023-07-20 VITALS — BP 124/82 | Temp 98.2°F | Ht 72.0 in | Wt 228.0 lb

## 2023-07-20 DIAGNOSIS — D497 Neoplasm of unspecified behavior of endocrine glands and other parts of nervous system: Secondary | ICD-10-CM

## 2023-07-20 DIAGNOSIS — Z09 Encounter for follow-up examination after completed treatment for conditions other than malignant neoplasm: Secondary | ICD-10-CM

## 2023-07-20 MED ORDER — CEPHALEXIN 500 MG PO CAPS: 500.0000 mg | ORAL_CAPSULE | Freq: Four times a day (QID) | ORAL | 0 refills | Status: AC

## 2023-07-20 MED ORDER — FLUCONAZOLE 150 MG PO TABS: 150.0000 mg | ORAL_TABLET | Freq: Every day | ORAL | 0 refills | Status: DC

## 2023-07-20 NOTE — Addendum Note (Signed)
Addended by: Sharlot Gowda on: 07/20/2023 03:56 PM   Modules accepted: Orders

## 2023-07-20 NOTE — Addendum Note (Signed)
Addended by: Sharlot Gowda on: 07/20/2023 03:58 PM   Modules accepted: Orders

## 2023-07-21 ENCOUNTER — Ambulatory Visit: Payer: Self-pay

## 2023-07-21 ENCOUNTER — Other Ambulatory Visit: Payer: Self-pay

## 2023-07-21 ENCOUNTER — Encounter: Payer: Self-pay | Admitting: Emergency Medicine

## 2023-07-21 ENCOUNTER — Emergency Department
Admission: EM | Admit: 2023-07-21 | Discharge: 2023-07-21 | Disposition: A | Discharge status: Home / Self Care | Payer: BC Managed Care – PPO | Attending: Emergency Medicine | Admitting: Emergency Medicine

## 2023-07-21 DIAGNOSIS — R55 Syncope and collapse: Secondary | ICD-10-CM | POA: Diagnosis not present

## 2023-07-21 DIAGNOSIS — E119 Type 2 diabetes mellitus without complications: Secondary | ICD-10-CM | POA: Insufficient documentation

## 2023-07-21 DIAGNOSIS — I10 Essential (primary) hypertension: Secondary | ICD-10-CM | POA: Insufficient documentation

## 2023-07-21 DIAGNOSIS — R42 Dizziness and giddiness: Secondary | ICD-10-CM | POA: Diagnosis present

## 2023-07-21 LAB — CBC WITH DIFFERENTIAL/PLATELET
Abs Immature Granulocytes: 0.04 10*3/uL (ref 0.00–0.07)
Basophils Absolute: 0 10*3/uL (ref 0.0–0.1)
Basophils Relative: 0 %
Eosinophils Absolute: 0.2 10*3/uL (ref 0.0–0.5)
Eosinophils Relative: 2 %
HCT: 47.1 % — ABNORMAL HIGH (ref 36.0–46.0)
Hemoglobin: 15.4 g/dL — ABNORMAL HIGH (ref 12.0–15.0)
Immature Granulocytes: 0 %
Lymphocytes Relative: 36 %
Lymphs Abs: 3.3 10*3/uL (ref 0.7–4.0)
MCH: 26.3 pg (ref 26.0–34.0)
MCHC: 32.7 g/dL (ref 30.0–36.0)
MCV: 80.5 fL (ref 80.0–100.0)
Monocytes Absolute: 0.7 10*3/uL (ref 0.1–1.0)
Monocytes Relative: 7 %
Neutro Abs: 4.9 10*3/uL (ref 1.7–7.7)
Neutrophils Relative %: 55 %
Platelets: 252 10*3/uL (ref 150–400)
RBC: 5.85 MIL/uL — ABNORMAL HIGH (ref 3.87–5.11)
RDW: 15.3 % (ref 11.5–15.5)
WBC: 9.1 10*3/uL (ref 4.0–10.5)
nRBC: 0 % (ref 0.0–0.2)

## 2023-07-21 LAB — COMPREHENSIVE METABOLIC PANEL
ALT: 27 U/L (ref 0–44)
AST: 22 U/L (ref 15–41)
Albumin: 4.4 g/dL (ref 3.5–5.0)
Alkaline Phosphatase: 74 U/L (ref 38–126)
Anion gap: 7 (ref 5–15)
BUN: 15 mg/dL (ref 6–20)
CO2: 24 mmol/L (ref 22–32)
Calcium: 9.4 mg/dL (ref 8.9–10.3)
Chloride: 105 mmol/L (ref 98–111)
Creatinine, Ser: 0.82 mg/dL (ref 0.44–1.00)
GFR, Estimated: >60 mL/min (ref >60)
Glucose, Bld: 144 mg/dL — ABNORMAL HIGH (ref 70–99)
Potassium: 4.1 mmol/L (ref 3.5–5.1)
Sodium: 136 mmol/L (ref 135–145)
Total Bilirubin: 0.6 mg/dL (ref 0.3–1.2)
Total Protein: 8.2 g/dL — ABNORMAL HIGH (ref 6.5–8.1)

## 2023-07-21 NOTE — ED Provider Notes (Signed)
[facility] Provider Note    Event Date/Time   First MD Initiated Contact with Patient 07/21/23 [phone]     (approximate)   History   Chief Complaint: Loss of Consciousness   HPI  [name] is a 47 y.o. female with a history of bipolar disorder, hypertension, diabetes who comes ED due to syncope.  This has been a recurrent issue for the past 4 weeks ever since having lumbar laminectomy.  Was seen in follow-up in neurosurgery clinic yesterday, continued on [facility] that she had previously been taking, recommended to come to ED for evaluation of her recurrent syncope.  Patient denies any acute pain complaints, no prodromal symptoms.  No chest pain shortness of breath headache or vision changes.  No neck pain or stiffness, no fever or chills.  Eating and drinking normally.  She reports that the episodes only happen when she is getting up from a laying down position.  Because of her back surgery, she is not able to lay on her back, and instead is sleeping on her right side.  Whenever she pushes herself upright, she gets lightheaded and passes out briefly.  Otherwise she is able to stand and ambulate, and currently denies any symptoms.  Outside records reviewed, noting that she was seen in neurosurgery office yesterday, wound healing okay, recommended to start applying Medihoney.     Physical Exam   Triage Vital Signs: ED Triage Vitals  Encounter Vitals Group     BP 07/21/23 0909 132/80     Systolic BP Percentile --      Diastolic BP Percentile --      Pulse Rate 07/21/23 0909 92     Resp 07/21/23 0909 16     Temp 07/21/23 0909 97.8 F (36.6 C)     Temp Source 07/21/23 0909 Oral     SpO2 07/21/23 0909 98 %     Weight 07/21/23 0911 226 lb (102.5 kg)     Height 07/21/23 0911 6' (1.829 m)     Head Circumference --      Peak Flow --      Pain Score 07/21/23 0910 4     Pain Loc --      Pain Education --      Exclude from Growth Chart --      Most recent vital signs: Vitals:   07/21/23 0909 07/21/23 1200  BP: 132/80 125/75  Pulse: 92 92  Resp: 16 11  Temp: 97.8 F (36.6 C)   SpO2: 98% 95%    General: Awake, no distress.  CV:  Good peripheral perfusion.  Regular rate and rhythm Resp:  Normal effort.  Clear to auscultation bilaterally Abd:  No distention.  Soft nontender Other:  Back surgical incision is healing appropriately, has some exudate which may be just residual Medihoney.  No inflammatory/infectious changes.   ED Results / Procedures / Treatments   Labs (all labs ordered are listed, but only abnormal results are displayed) Labs Reviewed  CBC WITH DIFFERENTIAL/PLATELET - Abnormal; Notable for the following components:      Result Value   RBC 5.85 (*)    Hemoglobin 15.4 (*)    HCT 47.1 (*)    All other components within normal limits  COMPREHENSIVE METABOLIC PANEL - Abnormal; Notable for the following components:   Glucose, Bld 144 (*)    Total Protein 8.2 (*)    All other components within normal limits     EKG Interpreted by me Sinus  rhythm rate of 95.  Normal axis intervals QRS ST segments and T waves   RADIOLOGY    PROCEDURES:  Procedures   MEDICATIONS ORDERED IN ED: Medications - No data to display   IMPRESSION / MDM / ASSESSMENT AND PLAN / ED COURSE  I reviewed the triage vital signs and the nursing notes.  DDx: Dehydration, AKI, anemia, vertigo, electrolyte abnormality  Patient's presentation is most consistent with acute presentation with potential threat to life or bodily function.  Patient presents with recurrent episodes of syncope over the past 4 weeks.  No worrisome associated symptoms.  Labs unremarkable, suggestive degree of dehydration.  Patient is ambulatory and sitting upright.  Encouraged her to increase water intake, will refer to cardiology.       FINAL CLINICAL IMPRESSION(S) / ED DIAGNOSES   Final diagnoses:  Syncope, unspecified syncope type     Rx  / DC Orders   ED Discharge Orders          Ordered    Ambulatory referral to Cardiology       Comments: If you have not heard from the Cardiology office within the next 72 hours please call [phone].   07/21/23 1221             Note:  This document was prepared using Dragon voice recognition software and may include unintentional dictation errors.   Sharman Cheek, MD 07/21/23 [phone]

## 2023-07-21 NOTE — Telephone Encounter (Signed)
  Chief Complaint: Positional passing out /seizure? Symptoms: Pt loses ability to control body in certain positions. See notes Frequency: since sx Pertinent Negatives: Patient denies  Disposition: [x] ED /[] Urgent Care (no appt availability in office) / [] Appointment(In office/virtual)/ []  [facility]/ [] Home Care/ [] Refused Recommended Disposition /[] [facility]/ []  Follow-up with PCP Additional Notes: Pt recently had surgery to remove a tumor. Since then pt is having episodes where she cannot control her body. She states that when she is trying to lay down on her right side into bed. Pt states that when she gets to a 45 degree angle to the bed, she loses the ability to control her body, she falls to the bed, her eyes roll up into head and she faints for 10-30 seconds. This also happens when she sits up after lying down.  These episodes do not happen at any other time. There are not increasing or decreasing. They seem to be strictly positional.  Pt spoke to Neuro yesterday - they places orders for Labs and recommended EKG at [facility].  Pt will go to ED for evaluation and care.   Pt also requests Rybelsus refill.    Reason for Disposition  Fainted 2 times in one day  Answer Assessment - Initial Assessment Questions 1. SYMPTOM: "What is the main symptom you are concerned about?" (e.g., weakness, numbness)     Passing out in certain positions 2. ONSET: "When did this start?" (minutes, hours, days; while sleeping)     After surgery 3. LAST NORMAL: "When was the last time you (the patient) were normal (no symptoms)?"     Before sx 4. PATTERN "Does this come and go, or has it been constant since it started?"  "Is it present now?"     Constant - positional  6. NEUROLOGIC SYMPTOMS: "Have you had any of the following symptoms: headache, dizziness, vision loss, double vision, changes in speech, unsteady on your feet?"     Dizziness, passing out  Answer Assessment - Initial  Assessment Questions 1. ONSET: "How long were you unconscious?" (minutes) "When did it happen?"     A few seconds - happens every time in certain positions 2. CONTENT: "What happened during period of unconsciousness?" (e.g., seizure activity)      Eyes roll back, cannot control body 3. MENTAL STATUS: "Alert and oriented now?" (oriented x 3 = name, month, location)      yes 4. TRIGGER: "What do you think caused the fainting?" "What were you doing just before you fainted?"  (e.g., exercise, sudden standing up, prolonged standing)     Positional 5. RECURRENT SYMPTOM: "Have you ever passed out before?" If Yes, ask: "When was the last time?" and "What happened that time?"      Yes - each time pt goes to certain positions 8. NEUROLOGIC SYMPTOMS: "Have you had any of the following symptoms: headache, numbness, vertigo, weakness?"     dizziness  Protocols used: Neurologic Deficit-A-AH, Fainting-A-AH

## 2023-07-21 NOTE — ED Triage Notes (Signed)
Pt here for syncope - 8 to 10 times a day, s/p L1-2 laminectomy on 9/9. Pts doctor, Dr. Myer Haff aware and sent pt here.

## 2023-07-21 NOTE — Telephone Encounter (Signed)
Agree with ER. 6 months of rybelsus called in in June. Should have Rx at pharmacy

## 2023-07-26 ENCOUNTER — Ambulatory Visit: Payer: BC Managed Care – PPO | Attending: Family Medicine

## 2023-07-26 ENCOUNTER — Encounter: Payer: Self-pay | Admitting: Family Medicine

## 2023-07-26 ENCOUNTER — Ambulatory Visit: Payer: BC Managed Care – PPO | Admitting: Family Medicine

## 2023-07-26 ENCOUNTER — Telehealth: Payer: Self-pay

## 2023-07-26 VITALS — BP 126/83 | HR 90 | Ht 72.0 in | Wt 229.4 lb

## 2023-07-26 DIAGNOSIS — N181 Chronic kidney disease, stage 1: Secondary | ICD-10-CM | POA: Diagnosis not present

## 2023-07-26 DIAGNOSIS — R55 Syncope and collapse: Secondary | ICD-10-CM

## 2023-07-26 DIAGNOSIS — R42 Dizziness and giddiness: Secondary | ICD-10-CM

## 2023-07-26 DIAGNOSIS — E1122 Type 2 diabetes mellitus with diabetic chronic kidney disease: Secondary | ICD-10-CM

## 2023-07-26 DIAGNOSIS — Z7984 Long term (current) use of oral hypoglycemic drugs: Secondary | ICD-10-CM

## 2023-07-26 MED ORDER — RYBELSUS 3 MG PO TABS: 3.0000 mg | ORAL_TABLET | Freq: Every day | ORAL | Status: DC

## 2023-07-26 MED ORDER — RYBELSUS 14 MG PO TABS: 14.0000 mg | ORAL_TABLET | Freq: Every day | ORAL | 1 refills | Status: DC

## 2023-07-26 NOTE — Telephone Encounter (Signed)
PA for Rybelsus 14 mg initiated and submitted via Cover My Meds. Key: [id number]

## 2023-07-26 NOTE — Progress Notes (Signed)
BP 126/83   Pulse 90   Ht 6' (1.829 m)   Wt 229 lb 6.4 oz (104.1 kg)   LMP 07/13/2020 (Approximate)   SpO2 100%   BMI 31.11 kg/m    Subjective:    Patient ID: [name], female    DOB: [birth year], 47 y.o.   MRN: [id number]  HPI: [name] is a 47 y.o. female  Chief Complaint  Patient presents with   Neurology Follow Up   Leshae presents today concerned about some neurologic symptoms she has been having. 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 She has been healing well, but she continues with some strange symptoms. She notes that she has been following with neurosurgery and they have been watching her wound. She notes that the tape is irritating her skin. Has a flap of her dressing to help with the irritation.   She notes that she has been having issues with dizziness and passing out when laying on her R side when she gets to a 46 degree angle.  She had blood tests and an EKG which came back normal. This only started when she had the surgery that they know about it. She notes that it is only on her R side and she knows exactly when it's going to happen based on the position that she is in. She will lose consciousness for about 10-30 seconds and then wake up again. She denies any seizure like activity or any other symptoms. She does note that she also gets dizzy when laying on her side. She has been concerned that this is due to vertigo and would like to see an ENT. The ER recommended that she see a cardiologist, but she has not been able to get in with them yet. She has been following with neurology prior to her surgery, but has not seen them regarding this issue. She also denies any loss of control of bowel or bladder when this happens. It is recurrent and happens about every day when she laying in bed.   She has been off her rybelsus for about 2 weeks due to issues with insurance.   Relevant past medical, surgical, family and  social history reviewed and updated as indicated. Interim medical history since our last visit reviewed. Allergies and medications reviewed and updated.  Review of Systems  Constitutional: Negative.   Respiratory: Negative.    Cardiovascular: Negative.   Genitourinary: Negative.   Musculoskeletal:  Positive for back pain. Negative for arthralgias, gait problem, joint swelling, myalgias, neck pain and neck stiffness.  Skin:  Positive for wound. Negative for color change, pallor and rash.  Neurological:  Positive for dizziness and syncope. Negative for tremors, seizures, facial asymmetry, speech difficulty, weakness, light-headedness, numbness and headaches.  Psychiatric/Behavioral: Negative.      Per HPI unless specifically indicated above     Objective:    BP 126/83   Pulse 90   Ht 6' (1.829 m)   Wt 229 lb 6.4 oz (104.1 kg)   LMP 07/13/2020 (Approximate)   SpO2 100%   BMI 31.11 kg/m   Wt Readings from Last 3 Encounters:  07/26/23 229 lb 6.4 oz (104.1 kg)  07/21/23 226 lb (102.5 kg)  07/20/23 228 lb (103.4 kg)    Physical Exam Vitals and nursing note reviewed.  Constitutional:      General: She is not in acute distress.    Appearance: Normal appearance. She is obese. She is not ill-appearing, toxic-appearing  or diaphoretic.  HENT:     Head: Normocephalic and atraumatic.     Right Ear: External ear normal.     Left Ear: External ear normal.     Nose: Nose normal.     Mouth/Throat:     Mouth: Mucous membranes are moist.     Pharynx: Oropharynx is clear.  Eyes:     General: No scleral icterus.       Right eye: No discharge.        Left eye: No discharge.     Extraocular Movements: Extraocular movements intact.     Conjunctiva/sclera: Conjunctivae normal.     Pupils: Pupils are equal, round, and reactive to light.  Cardiovascular:     Rate and Rhythm: Normal rate and regular rhythm.     Pulses: Normal pulses.     Heart sounds: Normal heart sounds. No murmur heard.     No friction rub. No gallop.  Pulmonary:     Effort: Pulmonary effort is normal. No respiratory distress.     Breath sounds: Normal breath sounds. No stridor. No wheezing, rhonchi or rales.  Chest:     Chest wall: No tenderness.  Musculoskeletal:        General: Normal range of motion.     Cervical back: Normal range of motion and neck supple.  Skin:    General: Skin is warm and dry.     Capillary Refill: Capillary refill takes less than 2 seconds.     Coloration: Skin is not jaundiced or pale.     Findings: No bruising, erythema, lesion or rash.     Comments: Well healing wound with granulation tissue around TL junction on back  Neurological:     General: No focal deficit present.     Mental Status: She is alert and oriented to person, place, and time. Mental status is at baseline.  Psychiatric:        Mood and Affect: Mood normal.        Behavior: Behavior normal.        Thought Content: Thought content normal.        Judgment: Judgment normal.     Results for orders placed or performed during the hospital encounter of 07/21/23  CBC with Differential  Result Value Ref Range   WBC 9.1 4.0 - 10.5 K/uL   RBC 5.85 (H) 3.87 - 5.11 MIL/uL   Hemoglobin 15.4 (H) 12.0 - 15.0 g/dL   HCT 16.1 (H) 09.6 - 04.5 %   MCV 80.5 80.0 - 100.0 fL   MCH 26.3 26.0 - 34.0 pg   MCHC 32.7 30.0 - 36.0 g/dL   RDW 40.9 81.1 - 91.4 %   Platelets 252 150 - 400 K/uL   nRBC 0.0 0.0 - 0.2 %   Neutrophils Relative % 55 %   Neutro Abs 4.9 1.7 - 7.7 K/uL   Lymphocytes Relative 36 %   Lymphs Abs 3.3 0.7 - 4.0 K/uL   Monocytes Relative 7 %   Monocytes Absolute 0.7 0.1 - 1.0 K/uL   Eosinophils Relative 2 %   Eosinophils Absolute 0.2 0.0 - 0.5 K/uL   Basophils Relative 0 %   Basophils Absolute 0.0 0.0 - 0.1 K/uL   Immature Granulocytes 0 %   Abs Immature Granulocytes 0.04 0.00 - 0.07 K/uL  Comprehensive metabolic panel  Result Value Ref Range   Sodium 136 135 - 145 mmol/L   Potassium 4.1 3.5 - 5.1  mmol/L   Chloride 105 98 -  111 mmol/L   CO2 24 22 - 32 mmol/L   Glucose, Bld 144 (H) 70 - 99 mg/dL   BUN 15 6 - 20 mg/dL   Creatinine, Ser 1.61 0.44 - 1.00 mg/dL   Calcium 9.4 8.9 - 09.6 mg/dL   Total Protein 8.2 (H) 6.5 - 8.1 g/dL   Albumin 4.4 3.5 - 5.0 g/dL   AST 22 15 - 41 U/L   ALT 27 0 - 44 U/L   Alkaline Phosphatase 74 38 - 126 U/L   Total Bilirubin 0.6 0.3 - 1.2 mg/dL   GFR, Estimated >04 >54 mL/min   Anion gap 7 5 - 15      Assessment & Plan:   Problem List Items Addressed This Visit       Endocrine   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 Will restart her on 3mg  for 2 weeks, then increase to 7 mg for 2 weeks to get her back to the 14mg       Relevant Medications   Semaglutide (RYBELSUS) 14 MG TABS   Semaglutide (RYBELSUS) 3 MG TABS     Other   Syncope - Primary    Recurrent and positional. EKG and labs normal at the ER. Has referral from ER to cardiology- encouraged her to keep that appointment. She would like to see ENT- referral placed today. Will get her 2nd opinion with neurology- referral placed today. Zio monitor ordered to look for ?arrhythmia. Had MRI of her head done after injury in the pool, so less inclined to immediately repeat it. It's happening while she's awake, so considering sleep study, but will wait on neurology's input on MRI of brain and sleep study. Will continue to monitor closely. Recheck in about 2 weeks.        Relevant Orders   LONG TERM MONITOR (3-14 DAYS)   Ambulatory referral to Neurology   Other Visit Diagnoses     Dizziness       See discussion under syncope.   Relevant Orders   LONG TERM MONITOR (3-14 DAYS)   Ambulatory referral to ENT   Ambulatory referral to Neurology        Follow up plan: Return in about 1 week (around 08/02/2023).   >40 minutes spent with patient today

## 2023-07-26 NOTE — Assessment & Plan Note (Signed)
Recurrent and positional. EKG and labs normal at the ER. Has referral from ER to cardiology- encouraged her to keep that appointment. She would like to see ENT- referral placed today. Will get her 2nd opinion with neurology- referral placed today. Zio monitor ordered to look for ?arrhythmia. Had MRI of her head done after injury in the pool, so less inclined to immediately repeat it. It's happening while she's awake, so considering sleep study, but will wait on neurology's input on MRI of brain and sleep study. Will continue to monitor closely. Recheck in about 2 weeks.

## 2023-07-26 NOTE — Assessment & Plan Note (Signed)
Has been off her rybelsus for about 2 weeks due to issues with insurance. Will restart her on 3mg  for 2 weeks, then increase to 7 mg for 2 weeks to get her back to the 14mg 

## 2023-07-27 NOTE — Telephone Encounter (Signed)
PA approved. Tried calling patient, no answer and VM not available. Will try to call again later to notify patient of approval.

## 2023-07-27 NOTE — Telephone Encounter (Signed)
Copied from CRM [id number]. Topic: General - Other >> Jul 27, 2023  2:38 PM Macon Large wrote: Reason for CRM: Pt returned call to the office. Informed pt of the message that PA was approved. Pt stated she will wait to hear back from the office.

## 2023-07-29 NOTE — Progress Notes (Unsigned)
   REFERRING PHYSICIAN:  Rosario Adie [address] [place],  [place] [address]  DOS: 06/26/23 L1-2 laminectomy for tumor resection  Pathology: Schwannoma  HISTORY OF PRESENT ILLNESS: [name] is here for a wound check. I saw her last on 07/20/23 and she was to continue on keflex and using medihoney.   Was seen in ED on 07/21/23 for dizziness and syncopal episodes. She was referred to cardiology. Saw PCP on 07/26/23 and she placed referral to ENT and to neurology for a second opinion. Cardiac monitor also ordered .   She has been doing dressing changes and medihoney. Most recently changed last night.   No fevers, chills, or drainage. Has been done with keflex for 2 days. No increased pain.    PHYSICAL EXAMINATION:  General: Patient is well developed, well nourished, calm, collected, and in no apparent distress.   NEUROLOGICAL:  General: In no acute distress.   Awake, alert, oriented to person, place, and time.  Pupils equal round and reactive to light.    Strength:         Side Iliopsoas Quads Hamstring PF DF EHL  R 5 5 5 5 5 5   L 5 5 5 5 5 5    Incision- see picture below.   No drainage noted. Has some superficial opening of incision at the top middle. Redness is improved. Has some eschar that is falling off.      ROS (Neurologic):  Negative except as noted above  IMAGING: No interval imaging to review   ASSESSMENT/PLAN:  [name] is doing fairly well after schwannoma resection.  No drainage from incision.   Treatment options reviewed with Dr. Myer Haff and following plan made with patient:   - Referral to wound clinic for continued care. Discussed it is healing by secondary intention. I am sending this patient to see [facility]. I am releasing further care of the wound to [facility] given the complexity of their wound healing.  - She will continue with medi honey and dry dressing changes.  Keep incision clean and dry until given further instructions from wound clinic.  -  Will hold on further antibiotics for now.  - No bending, twisting, or lifting.  - Continue to follow with PCP, ENT, neurology, and cardiology as above.  - Follow up as scheduled on 08/10/23.   Advised to contact the office if any questions or concerns arise.  Drake Leach PA-C Department of neurosurgery

## 2023-07-31 DIAGNOSIS — R55 Syncope and collapse: Secondary | ICD-10-CM

## 2023-07-31 DIAGNOSIS — R42 Dizziness and giddiness: Secondary | ICD-10-CM

## 2023-08-01 ENCOUNTER — Ambulatory Visit (INDEPENDENT_AMBULATORY_CARE_PROVIDER_SITE_OTHER): Payer: BC Managed Care – PPO | Admitting: Orthopedic Surgery

## 2023-08-01 ENCOUNTER — Encounter: Payer: Self-pay | Admitting: Orthopedic Surgery

## 2023-08-01 VITALS — BP 124/78 | Temp 97.8°F | Ht 72.0 in | Wt 229.0 lb

## 2023-08-01 DIAGNOSIS — Z09 Encounter for follow-up examination after completed treatment for conditions other than malignant neoplasm: Secondary | ICD-10-CM

## 2023-08-01 DIAGNOSIS — D497 Neoplasm of unspecified behavior of endocrine glands and other parts of nervous system: Secondary | ICD-10-CM

## 2023-08-02 ENCOUNTER — Encounter: Payer: BC Managed Care – PPO | Attending: Internal Medicine | Admitting: Internal Medicine

## 2023-08-02 ENCOUNTER — Encounter: Payer: Self-pay | Admitting: Family Medicine

## 2023-08-02 ENCOUNTER — Ambulatory Visit: Payer: BC Managed Care – PPO | Admitting: Family Medicine

## 2023-08-02 VITALS — BP 109/73 | HR 87 | Ht 72.0 in | Wt 230.0 lb

## 2023-08-02 DIAGNOSIS — F319 Bipolar disorder, unspecified: Secondary | ICD-10-CM | POA: Diagnosis not present

## 2023-08-02 DIAGNOSIS — Z86018 Personal history of other benign neoplasm: Secondary | ICD-10-CM | POA: Insufficient documentation

## 2023-08-02 DIAGNOSIS — Z720 Tobacco use: Secondary | ICD-10-CM

## 2023-08-02 DIAGNOSIS — F17210 Nicotine dependence, cigarettes, uncomplicated: Secondary | ICD-10-CM | POA: Diagnosis not present

## 2023-08-02 DIAGNOSIS — E1122 Type 2 diabetes mellitus with diabetic chronic kidney disease: Secondary | ICD-10-CM | POA: Insufficient documentation

## 2023-08-02 DIAGNOSIS — T8131XA Disruption of external operation (surgical) wound, not elsewhere classified, initial encounter: Secondary | ICD-10-CM | POA: Insufficient documentation

## 2023-08-02 DIAGNOSIS — I129 Hypertensive chronic kidney disease with stage 1 through stage 4 chronic kidney disease, or unspecified chronic kidney disease: Secondary | ICD-10-CM | POA: Insufficient documentation

## 2023-08-02 DIAGNOSIS — R55 Syncope and collapse: Secondary | ICD-10-CM | POA: Diagnosis not present

## 2023-08-02 DIAGNOSIS — J449 Chronic obstructive pulmonary disease, unspecified: Secondary | ICD-10-CM | POA: Diagnosis not present

## 2023-08-02 DIAGNOSIS — N189 Chronic kidney disease, unspecified: Secondary | ICD-10-CM | POA: Insufficient documentation

## 2023-08-02 DIAGNOSIS — I2510 Atherosclerotic heart disease of native coronary artery without angina pectoris: Secondary | ICD-10-CM | POA: Diagnosis not present

## 2023-08-02 DIAGNOSIS — L98428 Non-pressure chronic ulcer of back with other specified severity: Secondary | ICD-10-CM | POA: Insufficient documentation

## 2023-08-02 DIAGNOSIS — X58XXXA Exposure to other specified factors, initial encounter: Secondary | ICD-10-CM | POA: Diagnosis not present

## 2023-08-02 NOTE — Progress Notes (Signed)
BP 109/73   Pulse 87   Ht 6' (1.829 m)   Wt 230 lb (104.3 kg)   LMP 07/13/2020 (Approximate)   SpO2 100%   BMI 31.19 kg/m    Subjective:    Patient ID: [name], female    DOB: 1976/04/16, 47 y.o.   MRN: [id number]  HPI: [name] is a 47 y.o. female  Chief Complaint  Patient presents with   Nicotine Dependence   Loss of Consciousness    Patient says she is feeling better today and says she thinks she has gotten a diagnosis.    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 Not feeling it. 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 She is scheduled fo see vestibular rehab this week. It's still happening every time she lays on her R side. No changes.   SMOKING CESSATION Smoking Status: current every day smoker Smoking Amount: 1/2ppd Smoking Onset: 47yo Smoking Quit Date: not set Smoking triggers:stress Type of tobacco use: cigarettes Children in the house: no Other household members who smoke: yes Treatments attempted: lozenges, patches Pneumovax: up to date   Relevant past medical, surgical, family and social history reviewed and updated as indicated. Interim medical history since our last visit reviewed. Allergies and medications reviewed and updated.  Review of Systems  Constitutional: Negative.   Respiratory: Negative.    Cardiovascular: Negative.   Gastrointestinal: Negative.   Musculoskeletal: Negative.   Skin: Negative.   Neurological:  Positive for dizziness, syncope, light-headedness and numbness. Negative for tremors, seizures, facial asymmetry, speech difficulty, weakness and headaches.  Hematological: Negative.   Psychiatric/Behavioral: Negative.      Per HPI unless specifically indicated above     Objective:    BP 109/73   Pulse 87   Ht 6' (1.829 m)   Wt 230 lb (104.3 kg)   LMP 07/13/2020 (Approximate)   SpO2 100%   BMI 31.19 kg/m   Wt Readings from Last 3 Encounters:  08/02/23 230 lb  (104.3 kg)  08/01/23 229 lb (103.9 kg)  07/26/23 229 lb 6.4 oz (104.1 kg)    Physical Exam Vitals and nursing note reviewed.  Constitutional:      General: She is not in acute distress.    Appearance: Normal appearance. She is not ill-appearing, toxic-appearing or diaphoretic.  HENT:     Head: Normocephalic and atraumatic.     Right Ear: External ear normal.     Left Ear: External ear normal.     Nose: Nose normal.     Mouth/Throat:     Mouth: Mucous membranes are moist.     Pharynx: Oropharynx is clear.  Eyes:     General: No scleral icterus.       Right eye: No discharge.        Left eye: No discharge.     Extraocular Movements: Extraocular movements intact.     Conjunctiva/sclera: Conjunctivae normal.     Pupils: Pupils are equal, round, and reactive to light.  Cardiovascular:     Rate and Rhythm: Normal rate and regular rhythm.     Pulses: Normal pulses.     Heart sounds: Normal heart sounds. No murmur heard.    No friction rub. No gallop.  Pulmonary:     Effort: Pulmonary effort is normal. No respiratory distress.     Breath sounds: Normal breath sounds. No stridor. No wheezing, rhonchi or rales.  Chest:     Chest wall: No  tenderness.  Musculoskeletal:        General: Normal range of motion.     Cervical back: Normal range of motion and neck supple.  Skin:    General: Skin is warm and dry.     Capillary Refill: Capillary refill takes less than 2 seconds.     Coloration: Skin is not jaundiced or pale.     Findings: No bruising, erythema, lesion or rash.  Neurological:     General: No focal deficit present.     Mental Status: She is alert and oriented to person, place, and time. Mental status is at baseline.  Psychiatric:        Mood and Affect: Mood normal.        Behavior: Behavior normal.        Thought Content: Thought content normal.        Judgment: Judgment normal.     Results for orders placed or performed during the hospital encounter of 07/21/23  CBC  with Differential  Result Value Ref Range   WBC 9.1 4.0 - 10.5 K/uL   RBC 5.85 (H) 3.87 - 5.11 MIL/uL   Hemoglobin 15.4 (H) 12.0 - 15.0 g/dL   HCT 16.1 (H) 09.6 - 04.5 %   MCV 80.5 80.0 - 100.0 fL   MCH 26.3 26.0 - 34.0 pg   MCHC 32.7 30.0 - 36.0 g/dL   RDW 40.9 81.1 - 91.4 %   Platelets 252 150 - 400 K/uL   nRBC 0.0 0.0 - 0.2 %   Neutrophils Relative % 55 %   Neutro Abs 4.9 1.7 - 7.7 K/uL   Lymphocytes Relative 36 %   Lymphs Abs 3.3 0.7 - 4.0 K/uL   Monocytes Relative 7 %   Monocytes Absolute 0.7 0.1 - 1.0 K/uL   Eosinophils Relative 2 %   Eosinophils Absolute 0.2 0.0 - 0.5 K/uL   Basophils Relative 0 %   Basophils Absolute 0.0 0.0 - 0.1 K/uL   Immature Granulocytes 0 %   Abs Immature Granulocytes 0.04 0.00 - 0.07 K/uL  Comprehensive metabolic panel  Result Value Ref Range   Sodium 136 135 - 145 mmol/L   Potassium 4.1 3.5 - 5.1 mmol/L   Chloride 105 98 - 111 mmol/L   CO2 24 22 - 32 mmol/L   Glucose, Bld 144 (H) 70 - 99 mg/dL   BUN 15 6 - 20 mg/dL   Creatinine, Ser 7.82 0.44 - 1.00 mg/dL   Calcium 9.4 8.9 - 95.6 mg/dL   Total Protein 8.2 (H) 6.5 - 8.1 g/dL   Albumin 4.4 3.5 - 5.0 g/dL   AST 22 15 - 41 U/L   ALT 27 0 - 44 U/L   Alkaline Phosphatase 74 38 - 126 U/L   Total Bilirubin 0.6 0.3 - 1.2 mg/dL   GFR, Estimated >21 >30 mL/min   Anion gap 7 5 - 15      Assessment & Plan:   Problem List Items Addressed This Visit       Other   Tobacco use - Primary    Will discuss starting wellbutrin with her psychiatrist. Continue to monitor. Will continue to cut down.       Syncope    Zio on now. Due to see cardiology. Seeing vestibular rehab, concern that this should not cause LOC. Continue to monitor and await cardiology's input.         Follow up plan: Return As scheduled.

## 2023-08-02 NOTE — Patient Instructions (Signed)
Cardiology office: [phone].

## 2023-08-04 NOTE — Progress Notes (Signed)
Signature([name]) Signed: 08/03/2023 5:31:34 PM By: Midge Aver MSN RN CNS WTA Entered By: Midge Aver on 08/02/2023 13:23:20 -------------------------------------------------------------------------------- Vitals Details Patient Name: Date of Service: [name] [name], [name]. 08/02/2023 12:30 PM Medical Record Number: [id number] Patient Account Number: [id number] Date of Birth/Sex: Treating RN: 1976-07-03 (46 y.o. [name] Primary Care Dorthey Depace: Olevia Perches Other Clinician: Referring Gianluca Chhim: Treating Taylyn Brame/Extender: RO BSO N, MICHA EL Ashok Cordia, Megan Weeks in Treatment: 0 Vital Signs [name] ([id number]) [id number] Page 10 of 10 Time Taken: 12:43 Temperature (F): 97.9 Height (in): 72 Pulse (bpm): 94 Source: Stated Respiratory Rate (breaths/min): 18 Weight (lbs): 230.4 Blood Pressure (mmHg): 135/86 Source: Stated Reference Range: 80 - 120 mg / dl Body Mass Index (BMI): 31.2 Electronic Signature([name]) Signed: 08/03/2023 5:31:34 PM By: Midge Aver MSN RN CNS WTA Entered By: Midge Aver on 08/02/2023 12:44:30  [name], [name] ([id number]) [id number] Page 1 of 10 Visit Report for 08/02/2023 Allergy List Details Patient Name: Date of Service: [name] [name], [name]. 08/02/2023 12:30 PM Medical Record Number: [id number] Patient Account Number: [id number] Date of Birth/Sex: Treating RN: April 08, 1976 (46 y.o. [name] Primary Care Sophya Vanblarcom: Olevia Perches Other Clinician: Referring Jewell Ryans: Treating Patina Spanier/Extender: RO BSO N, MICHA EL Ashok Cordia, Megan Weeks in Treatment: 0 Allergies Active Allergies Mounjaro Ozempic semaglutide Allergy Notes Electronic Signature([name]) Signed: 08/03/2023 5:31:34 PM By: Midge Aver MSN RN CNS WTA Entered By: Midge Aver on 08/02/2023 12:39:28 -------------------------------------------------------------------------------- Arrival Information Details Patient Name: Date of Service: [name] [name], [name]. 08/02/2023 12:30 PM Medical Record Number: [id number] Patient Account Number: [id number] Date of Birth/Sex: Treating RN: [birth year] (46 y.o. [name] Primary Care Zalia Hautala: Olevia Perches Other Clinician: Referring Kanita Delage: Treating Anniya Whiters/Extender: RO BSO N, MICHA EL Ashok Cordia, Megan Weeks in Treatment: 0 Visit Information Patient Arrived: Ambulatory Arrival Time: 12:39 Accompanied By: SELF Transfer Assistance: None Patient Identification Verified: Yes Secondary Verification Process Completed: Yes Patient Requires Transmission-Based Precautions: No Patient Has Alerts: Yes Patient Alerts: Diabetic [name] ([id number]) [id number] Page 2 of 10 Electronic Signature([name]) Signed: 08/03/2023 5:31:34 PM By: Midge Aver MSN RN CNS WTA Entered By: Midge Aver on 08/02/2023 12:43:10 -------------------------------------------------------------------------------- Clinic Level of Care Assessment Details Patient Name: Date of Service: [name]  [name], [name]. 08/02/2023 12:30 PM Medical Record Number: [id number] Patient Account Number: [id number] Date of Birth/Sex: Treating RN: 05-Oct-1976 (46 y.o. [name] Primary Care Kodee Ravert: Olevia Perches Other Clinician: Referring Guilford Shannahan: Treating Ry Moody/Extender: RO BSO N, MICHA EL Ashok Cordia, Megan Weeks in Treatment: 0 Clinic Level of Care Assessment Items TOOL 1 Quantity Score X- 1 0 Use when EandM and Procedure is performed on INITIAL visit ASSESSMENTS - Nursing Assessment / Reassessment X- 1 20 General Physical Exam (combine w/ comprehensive assessment (listed just below) when performed on new pt. evals) X- 1 25 Comprehensive Assessment (HX, ROS, Risk Assessments, Wounds Hx, etc.) ASSESSMENTS - Wound and Skin Assessment / Reassessment []  - 0 Dermatologic / Skin Assessment (not related to wound area) ASSESSMENTS - Ostomy and/or Continence Assessment and Care []  - 0 Incontinence Assessment and Management []  - 0 Ostomy Care Assessment and Management (repouching, etc.) PROCESS - Coordination of Care []  - 0 Simple Patient / Family Education for ongoing care X- 1 20 Complex (extensive) Patient / Family Education for ongoing care X- 1 10 Staff obtains Chiropractor, Records, T Results / Process Orders est []  - 0 Staff telephones HHA, Nursing Homes / Clarify orders / etc []  - 0 Routine Transfer to another Facility (non-emergent condition) []  - 0 Routine Hospital Admission (non-emergent condition) X- 1 15 New Admissions / Manufacturing engineer / Ordering NPWT Apligraf, etc. , []  - 0 Emergency Hospital Admission (emergent condition) PROCESS - Special Needs []  - 0 Pediatric / Minor Patient Management []  - 0 Isolation Patient Management []  - 0 Hearing / Language / Visual special needs []  - 0 Assessment of Community assistance (transportation, D/C planning, etc.) []  - 0 Additional assistance / Altered mentation []  - 0 Support Surface([name]) Assessment (bed,  cushion, seat, etc.) INTERVENTIONS - Miscellaneous []  - 0 External ear exam []  - 0 Patient Transfer (multiple staff / [employer] / Similar devices) [name] ([id number]) [phone].pdf Page 3 of 10 []  - 0 Simple Staple / Suture removal (25 or less) []  - 0 Complex Staple / Suture removal (26 or more) []  -  Debridement Measurements L x 13x1x4 N/A N/A W x D (cm) 40.841 N/A N/A Post Debridement Volume: (cm) Debridement N/A N/A Procedures Performed: Treatment Notes Wound #1 (Back) Wound Laterality: Midline Cleanser Byram Ancillary Kit - 15 Day Supply Discharge Instruction: Use supplies as instructed; Kit contains: (15) Saline Bullets; (15) 3x3 Gauze; 15 pr Gloves Normal Saline Discharge Instruction: Wash your hands with soap and water. Remove old dressing, discard into plastic bag and place into trash. Cleanse the wound with Normal Saline prior to applying a clean dressing using gauze sponges, not tissues or cotton balls. Do not scrub or use excessive force. Pat dry using gauze sponges, not tissue or cotton balls. Vashe 5.8 (oz) Discharge Instruction: Use vashe 5.8 (oz) as directed Peri-Wound Care Topical Santyl Collagenase Ointment, 30 (gm), tube Discharge Instruction: apply nickel thick to wound bed only Primary Dressing Hydrofera Blue Ready Transfer Foam, 4x5 (in/in) Discharge Instruction: Apply  Hydrofera Blue Ready to wound bed as directed Secondary Dressing ABD Pad 5x9 (in/in) Discharge Instruction: Cover with ABD pad Secured With Medipore T - 58M Medipore H Soft Cloth Surgical T ape ape, 2x2 (in/yd) Compression Wrap Compression Stockings Add-Ons Electronic Signature([name]) Signed: 08/02/2023 4:42:07 PM By: Baltazar Najjar MD Entered By: Baltazar Najjar on 08/02/2023 13:40:48 -------------------------------------------------------------------------------- Multi-Disciplinary Care Plan Details Patient Name: Date of Service: [name] [name], Britnay [name]. 08/02/2023 12:30 PM Medical Record Number: [id number] Patient Account Number: [id number] Date of Birth/Sex: Treating RN: 08-Sep-1976 (46 y.o. [name] Primary Care Devynne Sturdivant: Olevia Perches Other Clinician: Referring Charmine Bockrath: Treating Genni Buske/Extender: RO BSO Dorris Carnes, MICHA EL Ashok Cordia, Megan Weeks in Treatment: 0 [name] ([id number]) [id number] Page 6 of 10 Active Inactive Orientation to the Wound Care Program Nursing Diagnoses: Knowledge deficit related to the wound healing center program Goals: Patient/caregiver will verbalize understanding of the Wound Healing Center Program Date Initiated: 08/02/2023 Target Resolution Date: 08/09/2023 Goal Status: Active Interventions: Provide education on orientation to the wound center Notes: Wound/Skin Impairment Nursing Diagnoses: Impaired tissue integrity Knowledge deficit related to ulceration/compromised skin integrity Goals: Patient/caregiver will verbalize understanding of skin care regimen Date Initiated: 08/02/2023 Target Resolution Date: 09/02/2023 Goal Status: Active Ulcer/skin breakdown will have a volume reduction of 30% by week 4 Date Initiated: 08/02/2023 Target Resolution Date: 09/02/2023 Goal Status: Active Ulcer/skin breakdown will have a volume reduction of 50% by week 8 Date Initiated: 08/02/2023 Target  Resolution Date: 10/02/2023 Goal Status: Active Ulcer/skin breakdown will have a volume reduction of 80% by week 12 Date Initiated: 08/02/2023 Target Resolution Date: 11/02/2023 Goal Status: Active Ulcer/skin breakdown will heal within 14 weeks Date Initiated: 08/02/2023 Target Resolution Date: 11/16/2023 Goal Status: Active Interventions: Assess patient/caregiver ability to obtain necessary supplies Assess patient/caregiver ability to perform ulcer/skin care regimen upon admission and as needed Assess ulceration([name]) every visit Provide education on smoking Provide education on ulcer and skin care Treatment Activities: Referred to DME Keshauna Degraffenreid for dressing supplies : 08/02/2023 Skin care regimen initiated : 08/02/2023 Topical wound management initiated : 08/02/2023 Notes: Electronic Signature([name]) Signed: 08/02/2023 2:13:53 PM By: Midge Aver MSN RN CNS WTA Previous Signature: 08/02/2023 2:13:48 PM Version By: Midge Aver MSN RN CNS WTA Entered By: Midge Aver on 08/02/2023 14:13:52 Pain Assessment Details -------------------------------------------------------------------------------- [name] ([id number]) [phone].pdf Page 7 of 10 Patient Name: Date of Service: [name] [name], Nichele [name]. 08/02/2023 12:30 PM Medical Record Number: [id number] Patient Account Number: [id number] Date of Birth/Sex: Treating RN: January 09, 1976 (46 y.o. [name] Primary Care Berea Majkowski: Olevia Perches Other Clinician: Referring Dionne Rossa:  [name], [name] ([id number]) [id number] Page 1 of 10 Visit Report for 08/02/2023 Allergy List Details Patient Name: Date of Service: [name] [name], [name]. 08/02/2023 12:30 PM Medical Record Number: [id number] Patient Account Number: [id number] Date of Birth/Sex: Treating RN: April 08, 1976 (46 y.o. [name] Primary Care Sophya Vanblarcom: Olevia Perches Other Clinician: Referring Jewell Ryans: Treating Patina Spanier/Extender: RO BSO N, MICHA EL Ashok Cordia, Megan Weeks in Treatment: 0 Allergies Active Allergies Mounjaro Ozempic semaglutide Allergy Notes Electronic Signature([name]) Signed: 08/03/2023 5:31:34 PM By: Midge Aver MSN RN CNS WTA Entered By: Midge Aver on 08/02/2023 12:39:28 -------------------------------------------------------------------------------- Arrival Information Details Patient Name: Date of Service: [name] [name], [name]. 08/02/2023 12:30 PM Medical Record Number: [id number] Patient Account Number: [id number] Date of Birth/Sex: Treating RN: [birth year] (46 y.o. [name] Primary Care Zalia Hautala: Olevia Perches Other Clinician: Referring Kanita Delage: Treating Anniya Whiters/Extender: RO BSO N, MICHA EL Ashok Cordia, Megan Weeks in Treatment: 0 Visit Information Patient Arrived: Ambulatory Arrival Time: 12:39 Accompanied By: SELF Transfer Assistance: None Patient Identification Verified: Yes Secondary Verification Process Completed: Yes Patient Requires Transmission-Based Precautions: No Patient Has Alerts: Yes Patient Alerts: Diabetic [name] ([id number]) [id number] Page 2 of 10 Electronic Signature([name]) Signed: 08/03/2023 5:31:34 PM By: Midge Aver MSN RN CNS WTA Entered By: Midge Aver on 08/02/2023 12:43:10 -------------------------------------------------------------------------------- Clinic Level of Care Assessment Details Patient Name: Date of Service: [name]  [name], [name]. 08/02/2023 12:30 PM Medical Record Number: [id number] Patient Account Number: [id number] Date of Birth/Sex: Treating RN: 05-Oct-1976 (46 y.o. [name] Primary Care Kodee Ravert: Olevia Perches Other Clinician: Referring Guilford Shannahan: Treating Ry Moody/Extender: RO BSO N, MICHA EL Ashok Cordia, Megan Weeks in Treatment: 0 Clinic Level of Care Assessment Items TOOL 1 Quantity Score X- 1 0 Use when EandM and Procedure is performed on INITIAL visit ASSESSMENTS - Nursing Assessment / Reassessment X- 1 20 General Physical Exam (combine w/ comprehensive assessment (listed just below) when performed on new pt. evals) X- 1 25 Comprehensive Assessment (HX, ROS, Risk Assessments, Wounds Hx, etc.) ASSESSMENTS - Wound and Skin Assessment / Reassessment []  - 0 Dermatologic / Skin Assessment (not related to wound area) ASSESSMENTS - Ostomy and/or Continence Assessment and Care []  - 0 Incontinence Assessment and Management []  - 0 Ostomy Care Assessment and Management (repouching, etc.) PROCESS - Coordination of Care []  - 0 Simple Patient / Family Education for ongoing care X- 1 20 Complex (extensive) Patient / Family Education for ongoing care X- 1 10 Staff obtains Chiropractor, Records, T Results / Process Orders est []  - 0 Staff telephones HHA, Nursing Homes / Clarify orders / etc []  - 0 Routine Transfer to another Facility (non-emergent condition) []  - 0 Routine Hospital Admission (non-emergent condition) X- 1 15 New Admissions / Manufacturing engineer / Ordering NPWT Apligraf, etc. , []  - 0 Emergency Hospital Admission (emergent condition) PROCESS - Special Needs []  - 0 Pediatric / Minor Patient Management []  - 0 Isolation Patient Management []  - 0 Hearing / Language / Visual special needs []  - 0 Assessment of Community assistance (transportation, D/C planning, etc.) []  - 0 Additional assistance / Altered mentation []  - 0 Support Surface([name]) Assessment (bed,  cushion, seat, etc.) INTERVENTIONS - Miscellaneous []  - 0 External ear exam []  - 0 Patient Transfer (multiple staff / [employer] / Similar devices) [name] ([id number]) [phone].pdf Page 3 of 10 []  - 0 Simple Staple / Suture removal (25 or less) []  - 0 Complex Staple / Suture removal (26 or more) []  -  0 Hypo/Hyperglycemic Management (do not check if billed separately) []  - 0 Ankle / Brachial Index (ABI) - do not check if billed separately Has the patient been seen at the hospital within the last three years: Yes Total Score: 90 Level Of Care: New/Established - Level 3 Electronic Signature([name]) Signed: 08/03/2023 5:31:34 PM By: Midge Aver MSN RN CNS WTA Entered By: Midge Aver on 08/02/2023 13:30:15 -------------------------------------------------------------------------------- Encounter Discharge Information Details Patient Name: Date of Service: [name] [name], [name]. 08/02/2023 12:30 PM Medical Record Number: [id number] Patient Account Number: [id number] Date of Birth/Sex: Treating RN: 02-02-1976 (46 y.o. [name] Primary Care Castin Donaghue: Olevia Perches Other Clinician: Referring Krystyna Cleckley: Treating Zaydin Billey/Extender: RO BSO N, MICHA EL Ashok Cordia, Megan Weeks in Treatment: 0 Encounter Discharge Information Items Post Procedure Vitals Discharge Condition: Stable Temperature (F): 97.9 Ambulatory Status: Ambulatory Pulse (bpm): 94 Discharge Destination: Home Respiratory Rate (breaths/min): 18 Transportation: Private Auto Blood Pressure (mmHg): 135/86 Accompanied By: SELF Schedule Follow-up Appointment: Yes Clinical Summary of Care: Electronic Signature([name]) Signed: 08/02/2023 2:16:49 PM By: Midge Aver MSN RN CNS WTA Entered By: Midge Aver on 08/02/2023 14:16:49 -------------------------------------------------------------------------------- Lower Extremity Assessment Details Patient Name: Date of Service: MCDO  NA LD-BO [name], [name]. 08/02/2023 12:30 PM Medical Record Number: [id number] Patient Account Number: [id number] Date of Birth/Sex: Treating RN: 31-Jan-1976 (46 y.o. [name] Primary Care Fallen Crisostomo: Olevia Perches Other Clinician: Referring Fateh Kindle: Treating Sheilyn Boehlke/Extender: RO BSO N, MICHA EL Ashok Cordia, Megan Weeks in Treatment: 0 Electronic Signature([name]) [name], [name] ([id number]) [id number] Page 4 of 10 Signed: 08/03/2023 5:31:34 PM By: Midge Aver MSN RN CNS WTA Entered By: Midge Aver on 08/02/2023 13:04:19 -------------------------------------------------------------------------------- Multi Wound Chart Details Patient Name: Date of Service: [name] [name], [name]. 08/02/2023 12:30 PM Medical Record Number: [id number] Patient Account Number: [id number] Date of Birth/Sex: Treating RN: Nov 09, 1975 (46 y.o. [name] Primary Care Amal Renbarger: Olevia Perches Other Clinician: Referring Callahan Peddie: Treating Alassane Kalafut/Extender: RO BSO N, MICHA EL Ashok Cordia, Megan Weeks in Treatment: 0 Vital Signs Height(in): 72 Pulse(bpm): 94 Weight(lbs): 230.4 Blood Pressure(mmHg): 135/86 Body Mass Index(BMI): 31.2 Temperature(F): 97.9 Respiratory Rate(breaths/min): 18 [1:Photos:] [N/A:N/A] Midline Back N/A N/A Wound Location: Surgical Injury N/A N/A Wounding Event: Open Surgical Wound N/A N/A Primary Etiology: Chronic Obstructive Pulmonary N/A N/A Comorbid History: Disease (COPD), Coronary Artery Disease, Type II Diabetes 06/26/2023 N/A N/A Date Acquired: 0 N/A N/A Weeks of Treatment: Open N/A N/A Wound Status: No N/A N/A Wound Recurrence: 13x1x4 N/A N/A Measurements L x W x D (cm) 10.21 N/A N/A A (cm) : rea 40.841 N/A N/A Volume (cm) : Full Thickness Without Exposed N/A N/A Classification: Support Structures Medium N/A N/A Exudate A mount: Purulent N/A N/A Exudate Type: yellow, brown, green N/A N/A Exudate  Color: Small (1-33%) N/A N/A Granulation A mount: Pink, Pale N/A N/A Granulation Quality: Large (67-100%) N/A N/A Necrotic A mount: Fat Layer (Subcutaneous Tissue): Yes N/A N/A Exposed Structures: Fascia: No Tendon: No Muscle: No Joint: No Bone: No None N/A N/A Epithelialization: Debridement - Selective/Open Wound N/A N/A Debridement: Pre-procedure Verification/Time Out 13:15 N/A N/A Taken: Lidocaine 4% Topical Solution N/A N/A Pain Control: Slough N/A N/A Tissue Debrided: Non-Viable Tissue N/A N/A Level: 10.2 N/A N/A Debridement A (sq cm): rea [name], [name] ([id number]) [phone].pdf Page 5 of 10 Curette N/A N/A Instrument: Moderate N/A N/A Bleeding: Silver Nitrate N/A N/A Hemostasis Achieved: 7 N/A N/A Procedural Pain: 7 N/A N/A Post Procedural Pain: Debridement Treatment Response: Procedure was tolerated well N/A N/A Post

## 2023-08-04 NOTE — Progress Notes (Signed)
[name] ([id number]) [phone] [email or web address] Page 1 of 5 Visit Report for 08/02/2023 Abuse Risk Screen Details Patient Name: Date of Service: [name] NA LD-BO [name], [name]. 08/02/2023 12:30 PM Medical Record Number: [id number] Patient Account Number: [id number] Date of Birth/Sex: Treating RN: 02-May-1976 (46 y.o. [name] Primary Care Eldar Robitaille: Olevia Perches Other Clinician: Referring [name]: Treating [name]/Extender: RO BSO N, MICHA EL Ashok Cordia, Megan Weeks in Treatment: 0 Abuse Risk Screen Items Answer Electronic Signature([name]) Signed: 08/03/2023 5:31:34 PM By: Midge Aver MSN RN CNS WTA Entered By: Midge Aver on 08/02/2023 12:51:33 -------------------------------------------------------------------------------- Activities of Daily Living Details Patient Name: Date of Service: [name] NA LD-BO [name], [name]. 08/02/2023 12:30 PM Medical Record Number: [id number] Patient Account Number: [id number] Date of Birth/Sex: Treating RN: Nov 18, 1975 (46 y.o. [name] Primary Care Calena Salem: Olevia Perches Other Clinician: Referring [name]: Treating Varnika Butz/Extender: RO BSO N, MICHA EL Ashok Cordia, Megan Weeks in Treatment: 0 Activities of Daily Living Items Answer Activities of Daily Living (Please select one for each item) Drive Automobile Completely Able T Medications ake Completely Able Use T elephone Completely Able Care for Appearance Completely Able Use T oilet Completely Able Bath / Shower Completely Able Dress Self Completely Able Feed Self Completely Able Walk Completely Able Get In / Out Bed Completely Able Housework Completely Able Prepare Meals Completely Able Handle Money Completely Able Shop for Self Completely Able [name] ([id number]) [phone] [id number] Page 2 of 5 Electronic Signature([name]) Signed: 08/03/2023 5:31:34 PM By: Midge Aver MSN RN CNS WTA Entered  By: Midge Aver on 08/02/2023 12:51:45 -------------------------------------------------------------------------------- Education Screening Details Patient Name: Date of Service: [name] NA LD-BO [name], [name]. 08/02/2023 12:30 PM Medical Record Number: [id number] Patient Account Number: [id number] Date of Birth/Sex: Treating RN: [birth year] (46 y.o. [name] Primary Care Cherry Turlington: Olevia Perches Other Clinician: Referring Erendira Crabtree: Treating Pascuala Klutts/Extender: RO BSO Dorris Carnes, MICHA EL Ashok Cordia, Megan Weeks in Treatment: 0 Learning Preferences/Education Level/Primary Language Learning Preference: Explanation, Demonstration Preferred Language: English Cognitive Barrier Language Barrier: No Translator Needed: No Memory Deficit: No Emotional Barrier: No Cultural/Religious Beliefs Affecting Medical Care: No Physical Barrier Impaired Vision: No Impaired Hearing: No Decreased Hand dexterity: No Knowledge/Comprehension Knowledge Level: High Comprehension Level: High Ability to understand written instructions: High Ability to understand verbal instructions: High Motivation Anxiety Level: Calm Cooperation: Cooperative Education Importance: Acknowledges Need Interest in Health Problems: Asks Questions Perception: Coherent Willingness to Engage in Self-Management High Activities: Readiness to Engage in Self-Management High Activities: Electronic Signature([name]) Signed: 08/03/2023 5:31:34 PM By: Midge Aver MSN RN CNS WTA Entered By: Midge Aver on 08/02/2023 12:52:06 [name] ([id number]) [id number] Page 3 of 5 -------------------------------------------------------------------------------- Fall Risk Assessment Details Patient Name: Date of Service: [name] NA LD-BO [name], [name]. 08/02/2023 12:30 PM Medical Record Number: [id number] Patient Account Number: [id number] Date of Birth/Sex: Treating RN: 1976-02-12 (46 y.o. [name] Primary Care Manav Pierotti: Olevia Perches Other Clinician: Referring [name]: Treating [name]/Extender: RO BSO N, MICHA EL Ashok Cordia, Megan Weeks in Treatment: 0 Fall Risk Assessment Items Have you had 2 or more falls in the last 12 monthso 0 No Have you had any fall that resulted in injury in the last 12 monthso 0 No FALLS RISK SCREEN History of falling - immediate or within 3 months 0 No Secondary diagnosis (Do you have 2 or more medical diagnoseso) 0 No Ambulatory aid None/bed rest/wheelchair/nurse 0 No Crutches/cane/walker 0 No Furniture 0 No Intravenous therapy Access/Saline/Heparin Lock 0 No Gait/Transferring Normal/ bed rest/ wheelchair  0 No Weak (short steps with or without shuffle, stooped but able to lift head while walking, may seek 0 No support from furniture) Impaired (short steps with shuffle, may have difficulty arising from chair, head down, impaired 0 No balance) Mental Status Oriented to own ability 0 Yes Electronic Signature([name]) Signed: 08/03/2023 5:31:34 PM By: Midge Aver MSN RN CNS WTA Entered By: Midge Aver on 08/02/2023 12:52:21 -------------------------------------------------------------------------------- Foot Assessment Details Patient Name: Date of Service: [name] NA LD-BO [name], [name]. 08/02/2023 12:30 PM Medical Record Number: [id number] Patient Account Number: [id number] Date of Birth/Sex: Treating RN: 1975/12/05 (46 y.o. [name] Primary Care Kalifa Cadden: Olevia Perches Other Clinician: Referring [name]: Treating [name]/Extender: RO BSO N, MICHA EL Ashok Cordia, Megan Weeks in Treatment: 0 Foot Assessment Items Site Locations [place], [place] [name] ([id number]) [phone] [id number] Page 4 of 5 + = Sensation present, - = Sensation absent, C = Callus, U = Ulcer R = Redness, W = Warmth, M = Maceration, PU = Pre-ulcerative lesion F = Fissure, [name] = Swelling, D = Dryness Assessment Right: Left: Other  Deformity: No No Prior Foot Ulcer: No No Prior Amputation: No No Charcot Joint: No No Ambulatory Status: Ambulatory Without Help Gait: Steady Electronic Signature([name]) Signed: 08/03/2023 5:31:34 PM By: Midge Aver MSN RN CNS WTA Entered By: Midge Aver on 08/02/2023 12:54:00 -------------------------------------------------------------------------------- Nutrition Risk Screening Details Patient Name: Date of Service: [name] NA LD-BO [name], [name]. 08/02/2023 12:30 PM Medical Record Number: [id number] Patient Account Number: [id number] Date of Birth/Sex: Treating RN: December 11, 1975 (46 y.o. [name] Primary Care Luisdaniel Kenton: Olevia Perches Other Clinician: Referring Jaydon Soroka: Treating Almus Woodham/Extender: RO BSO N, MICHA EL Ashok Cordia, Megan Weeks in Treatment: 0 Height (in): 72 Weight (lbs): 230.4 Body Mass Index (BMI): 31.2 Nutrition Risk Screening Items Score Screening NUTRITION RISK SCREEN: I have an illness or condition that made me change the kind and/or amount of food I eat 0 No I eat fewer than two meals per day 0 No I eat few fruits and vegetables, or milk products 0 No I have three or more drinks of beer, liquor or wine almost every day 0 No I have tooth or mouth problems that make it hard for me to eat 0 No I don't always have enough money to buy the food I need 0 No [name], Kynsley [name] ([id number]) [id number] Page 5 of 5 I eat alone most of the time 0 No I take three or more different prescribed or over-the-counter drugs a day 1 Yes Without wanting to, I have lost or gained 10 pounds in the last six months 0 No I am not always physically able to shop, cook and/or feed myself 0 No Nutrition Protocols Good Risk Protocol 0 No interventions needed Moderate Risk Protocol High Risk Proctocol Risk Level: Good Risk Score: 1 Electronic Signature([name]) Signed: 08/03/2023 5:31:34 PM By: Midge Aver MSN RN CNS WTA Entered By: Midge Aver on 08/02/2023 12:53:27

## 2023-08-04 NOTE — Progress Notes (Addendum)
[name]. 08/02/2023 12:30 PM Medical Record Number: [id number] Patient Account Number: [id number] Date of Birth/Sex: Treating RN: 07/28/1976 (47 y.o. 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 08/02/2023 12:30 PM Medical Record Number: [id number] Patient Account Number: [id number] Date of Birth/Sex: Treating RN: 04/21/1976 (47 y.o. 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 This was apparently a schwannoma according to the patient. She was admitted to hospital from 9/9 through 9/12 for surgical resection. She was discharged with a surgical wound that was sutured. The sutures were removed 2 weeks later and then about 3 or 4 days after that the wound dehisced. She was seen in the neurosurgery clinic on 10/3 with an open wound they gave her Keflex empirically which she has now finished and they used Medihoney to the midline wound. The patient has been applying this diligently. She has completed her antibiotics. The area is tender but no systemic symptoms. Past medical history includes bipolar disorder, hypertension, diabetes, coronary artery disease, COPD still smoking, she has recently had syncopal episodes and is wearing a heart monitor. Patient History Information obtained from Patient. Allergies Mounjaro, Ozempic, semaglutide Social History Current every day smoker - 1/2 ppd, Marital Status - Married, Alcohol Use - Never, Drug Use - No History, Caffeine Use - Moderate. Medical History Respiratory Patient has history of Chronic Obstructive Pulmonary Disease (COPD) Cardiovascular Patient has history of Coronary Artery  Disease Endocrine Patient has history of Type II Diabetes Medical A Surgical History Notes nd Genitourinary chronic kidney disease with history of kidney stones. Psychiatric bipolar Review of Systems (ROS) Constitutional Symptoms (General Health) Denies complaints or symptoms of Fatigue, Fever, Chills, Marked Weight Change. Eyes Complains or has symptoms of Glasses / Contacts. Ear/Nose/Mouth/Throat Denies complaints or symptoms of Difficult clearing ears, Sinusitis. Hematologic/Lymphatic Denies complaints or symptoms of Bleeding / Clotting Disorders, Human Immunodeficiency Virus. Immunological Denies complaints or symptoms of Hives, Itching. [name] ([id number]) [id number] Page 6 of 9 Integumentary (Skin) Denies complaints or symptoms of Wounds, Bleeding or bruising tendency, Breakdown, Swelling. Musculoskeletal Denies complaints or symptoms of Muscle Pain, Muscle Weakness. Neurologic Denies complaints or symptoms of Numbness/parasthesias, Focal/Weakness. Psychiatric Complains or has symptoms of Anxiety. Objective Constitutional Sitting or standing Blood Pressure is within target range for patient.. Pulse regular and within target range for patient.Marland Kitchen Respirations regular, non-labored and within target range.. Temperature is normal and within the target range for the patient.Marland Kitchen appears in no distress. Vitals  [name]. 08/02/2023 12:30 PM Medical Record Number: [id number] Patient Account Number: [id number] Date of Birth/Sex: Treating RN: 07/28/1976 (47 y.o. 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 08/02/2023 12:30 PM Medical Record Number: [id number] Patient Account Number: [id number] Date of Birth/Sex: Treating RN: 04/21/1976 (47 y.o. 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 This was apparently a schwannoma according to the patient. She was admitted to hospital from 9/9 through 9/12 for surgical resection. She was discharged with a surgical wound that was sutured. The sutures were removed 2 weeks later and then about 3 or 4 days after that the wound dehisced. She was seen in the neurosurgery clinic on 10/3 with an open wound they gave her Keflex empirically which she has now finished and they used Medihoney to the midline wound. The patient has been applying this diligently. She has completed her antibiotics. The area is tender but no systemic symptoms. Past medical history includes bipolar disorder, hypertension, diabetes, coronary artery disease, COPD still smoking, she has recently had syncopal episodes and is wearing a heart monitor. Patient History Information obtained from Patient. Allergies Mounjaro, Ozempic, semaglutide Social History Current every day smoker - 1/2 ppd, Marital Status - Married, Alcohol Use - Never, Drug Use - No History, Caffeine Use - Moderate. Medical History Respiratory Patient has history of Chronic Obstructive Pulmonary Disease (COPD) Cardiovascular Patient has history of Coronary Artery  Disease Endocrine Patient has history of Type II Diabetes Medical A Surgical History Notes nd Genitourinary chronic kidney disease with history of kidney stones. Psychiatric bipolar Review of Systems (ROS) Constitutional Symptoms (General Health) Denies complaints or symptoms of Fatigue, Fever, Chills, Marked Weight Change. Eyes Complains or has symptoms of Glasses / Contacts. Ear/Nose/Mouth/Throat Denies complaints or symptoms of Difficult clearing ears, Sinusitis. Hematologic/Lymphatic Denies complaints or symptoms of Bleeding / Clotting Disorders, Human Immunodeficiency Virus. Immunological Denies complaints or symptoms of Hives, Itching. [name] ([id number]) [id number] Page 6 of 9 Integumentary (Skin) Denies complaints or symptoms of Wounds, Bleeding or bruising tendency, Breakdown, Swelling. Musculoskeletal Denies complaints or symptoms of Muscle Pain, Muscle Weakness. Neurologic Denies complaints or symptoms of Numbness/parasthesias, Focal/Weakness. Psychiatric Complains or has symptoms of Anxiety. Objective Constitutional Sitting or standing Blood Pressure is within target range for patient.. Pulse regular and within target range for patient.Marland Kitchen Respirations regular, non-labored and within target range.. Temperature is normal and within the target range for the patient.Marland Kitchen appears in no distress. Vitals  CAMIYA, DIGENOVA [name] ([id number]) [id number] Page 1 of 9 Visit Report for 08/02/2023 Chief Complaint Document Details Patient Name: Date of Service: [name] NA LD-BO [name], [name]. 08/02/2023 12:30 PM Medical Record Number: [id number] Patient Account Number: [id number] Date of Birth/Sex: Treating RN: 01/24/1976 (46 y.o. [name] Primary Care Provider: Olevia Perches Other Clinician: Referring Provider: Treating Provider/Extender: RO BSO N, MICHA EL Ashok Cordia, Megan Weeks in Treatment: 0 Information Obtained from: Patient Chief Complaint 08/02/2023; patient is here for review of a surgical wound status post L1-L2 laminectomy Electronic Signature([name]) Signed: 08/02/2023 4:42:07 PM By: Baltazar Najjar MD Entered By: Baltazar Najjar on 08/02/2023 10:41:24 -------------------------------------------------------------------------------- Debridement Details Patient Name: Date of Service: [name] NA LD-BO [name], [name]. 08/02/2023 12:30 PM Medical Record Number: [id number] Patient Account Number: [id number] Date of Birth/Sex: Treating RN: [birth year] (46 y.o. [name] Primary Care Provider: Olevia Perches Other Clinician: Referring Provider: Treating Provider/Extender: RO BSO N, MICHA EL Ashok Cordia, Megan Weeks in Treatment: 0 Debridement Performed for Assessment: Wound #1 Midline Back Performed By: Physician Maxwell Caul, MD Debridement Type: Debridement Level of Consciousness (Pre-procedure): Awake and Alert Pre-procedure Verification/Time Out Yes - 13:15 Taken: Start Time: 13:15 Pain Control: Lidocaine 4% T opical Solution Percent of Wound Bed Debrided: 100% T Area Debrided (cm): otal 10.2 Tissue and other material debrided: Viable, Non-Viable, Slough, Subcutaneous, Slough Level: Skin/Subcutaneous Tissue Debridement Description: Excisional Instrument: Curette Bleeding: Moderate Hemostasis Achieved: Silver Nitrate Procedural Pain:  7 Post Procedural Pain: 7 Response to Treatment: Procedure was tolerated well [name] ([id number]) [phone].pdf Page 2 of 9 Level of Consciousness (Post- Awake and Alert procedure): Post Debridement Measurements of Total Wound Length: (cm) 13 Width: (cm) 1 Depth: (cm) 4 Volume: (cm) 40.841 Character of Wound/Ulcer Post Debridement: Stable Post Procedure Diagnosis Same as Pre-procedure Electronic Signature([name]) Signed: 08/02/2023 4:42:07 PM By: Baltazar Najjar MD Signed: 08/03/2023 5:31:34 PM By: Midge Aver MSN RN CNS WTA Entered By: Baltazar Najjar on 08/02/2023 10:41:00 -------------------------------------------------------------------------------- HPI Details Patient Name: Date of Service: [name] NA LD-BO [name], [name]. 08/02/2023 12:30 PM Medical Record Number: [id number] Patient Account Number: [id number] Date of Birth/Sex: Treating RN: 20-Sep-1976 (46 y.o. 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 This was apparently a schwannoma according to the patient. She was admitted to hospital from 9/9 through 9/12 for surgical resection. She was discharged with a surgical wound that was sutured. The sutures were removed 2 weeks later and then about 3 or 4 days after that the wound dehisced. She was seen in the neurosurgery clinic on 10/3 with an open wound they gave her Keflex empirically which she has now finished and they used Medihoney to the midline wound. The patient has been applying this diligently. She has completed her antibiotics. The area is tender but  no systemic symptoms. Past medical history includes bipolar disorder, hypertension, diabetes, coronary artery disease, COPD still smoking, she has recently had syncopal episodes and is wearing a heart monitor. Electronic Signature([name]) Signed: 08/02/2023 4:42:07 PM By: Baltazar Najjar MD Entered By: Baltazar Najjar on 08/02/2023 10:44:05 [name] ([id number]) [id number] Page 3 of 9 -------------------------------------------------------------------------------- Physical Exam Details Patient Name: Date of Service: [name] NA LD-BO [name], [name]  CAMIYA, DIGENOVA [name] ([id number]) [id number] Page 1 of 9 Visit Report for 08/02/2023 Chief Complaint Document Details Patient Name: Date of Service: [name] NA LD-BO [name], [name]. 08/02/2023 12:30 PM Medical Record Number: [id number] Patient Account Number: [id number] Date of Birth/Sex: Treating RN: 01/24/1976 (46 y.o. [name] Primary Care Provider: Olevia Perches Other Clinician: Referring Provider: Treating Provider/Extender: RO BSO N, MICHA EL Ashok Cordia, Megan Weeks in Treatment: 0 Information Obtained from: Patient Chief Complaint 08/02/2023; patient is here for review of a surgical wound status post L1-L2 laminectomy Electronic Signature([name]) Signed: 08/02/2023 4:42:07 PM By: Baltazar Najjar MD Entered By: Baltazar Najjar on 08/02/2023 10:41:24 -------------------------------------------------------------------------------- Debridement Details Patient Name: Date of Service: [name] NA LD-BO [name], [name]. 08/02/2023 12:30 PM Medical Record Number: [id number] Patient Account Number: [id number] Date of Birth/Sex: Treating RN: [birth year] (46 y.o. [name] Primary Care Provider: Olevia Perches Other Clinician: Referring Provider: Treating Provider/Extender: RO BSO N, MICHA EL Ashok Cordia, Megan Weeks in Treatment: 0 Debridement Performed for Assessment: Wound #1 Midline Back Performed By: Physician Maxwell Caul, MD Debridement Type: Debridement Level of Consciousness (Pre-procedure): Awake and Alert Pre-procedure Verification/Time Out Yes - 13:15 Taken: Start Time: 13:15 Pain Control: Lidocaine 4% T opical Solution Percent of Wound Bed Debrided: 100% T Area Debrided (cm): otal 10.2 Tissue and other material debrided: Viable, Non-Viable, Slough, Subcutaneous, Slough Level: Skin/Subcutaneous Tissue Debridement Description: Excisional Instrument: Curette Bleeding: Moderate Hemostasis Achieved: Silver Nitrate Procedural Pain:  7 Post Procedural Pain: 7 Response to Treatment: Procedure was tolerated well [name] ([id number]) [phone].pdf Page 2 of 9 Level of Consciousness (Post- Awake and Alert procedure): Post Debridement Measurements of Total Wound Length: (cm) 13 Width: (cm) 1 Depth: (cm) 4 Volume: (cm) 40.841 Character of Wound/Ulcer Post Debridement: Stable Post Procedure Diagnosis Same as Pre-procedure Electronic Signature([name]) Signed: 08/02/2023 4:42:07 PM By: Baltazar Najjar MD Signed: 08/03/2023 5:31:34 PM By: Midge Aver MSN RN CNS WTA Entered By: Baltazar Najjar on 08/02/2023 10:41:00 -------------------------------------------------------------------------------- HPI Details Patient Name: Date of Service: [name] NA LD-BO [name], [name]. 08/02/2023 12:30 PM Medical Record Number: [id number] Patient Account Number: [id number] Date of Birth/Sex: Treating RN: 20-Sep-1976 (46 y.o. 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 This was apparently a schwannoma according to the patient. She was admitted to hospital from 9/9 through 9/12 for surgical resection. She was discharged with a surgical wound that was sutured. The sutures were removed 2 weeks later and then about 3 or 4 days after that the wound dehisced. She was seen in the neurosurgery clinic on 10/3 with an open wound they gave her Keflex empirically which she has now finished and they used Medihoney to the midline wound. The patient has been applying this diligently. She has completed her antibiotics. The area is tender but  no systemic symptoms. Past medical history includes bipolar disorder, hypertension, diabetes, coronary artery disease, COPD still smoking, she has recently had syncopal episodes and is wearing a heart monitor. Electronic Signature([name]) Signed: 08/02/2023 4:42:07 PM By: Baltazar Najjar MD Entered By: Baltazar Najjar on 08/02/2023 10:44:05 [name] ([id number]) [id number] Page 3 of 9 -------------------------------------------------------------------------------- Physical Exam Details Patient Name: Date of Service: [name] NA LD-BO [name], [name]  Secured With: Medipore T - 19M Medipore H Soft Cloth Surgical T ape ape, 2x2 (in/yd) (DME) (Generic) 1 x Per Day/30 Days 1. Dehisced surgical wound 2. Debridement of necrotic surface over a large portion of this wound. I was still not able to get the viable tissue. 3. At the superior surface there is a deeper probing area that drained what I think was a seroma clear yellow-tinged fluid. I did a swab culture of this area postdebridement. 4. We are going to use Santyl and Hydrofera Blue. This will need to be changed daily. 5. We took a picture of the  process so that somebody will be able to watch how to do this dressing at home. 6. She is not currently working. I cautioned her on removing all pressure from this wound area. Electronic Signature([name]) Signed: 08/02/2023 4:42:07 PM By: Baltazar Najjar MD Entered By: Baltazar Najjar on 08/02/2023 10:48:14 -------------------------------------------------------------------------------- ROS/PFSH Details Patient Name: Date of Service: [name] NA LD-BO [name], [name]. 08/02/2023 12:30 PM Medical Record Number: [id number] Patient Account Number: [id number] Date of Birth/Sex: Treating RN: 02/05/1976 (46 y.o. [name] Primary Care Provider: Olevia Perches Other Clinician: Referring Provider: Treating Provider/Extender: RO BSO N, MICHA EL Ashok Cordia, Megan Weeks in Treatment: 0 Information Obtained From Patient Constitutional Symptoms (General Health) Complaints and Symptoms: Negative for: Fatigue; Fever; Chills; Marked Weight Change Eyes Complaints and Symptoms: Positive for: Glasses / Contacts Ear/Nose/Mouth/Throat Complaints and Symptoms: Negative for: Difficult clearing ears; Sinusitis Hematologic/Lymphatic Complaints and Symptoms: Negative for: Bleeding / Clotting Disorders; Human Immunodeficiency Virus Immunological Complaints and Symptoms: Negative for: Hives; Itching Integumentary (Skin) Complaints and Symptoms: QUINTASHA, SCHOENDORF [name] ([id number]) [phone].pdf Page 8 of 9 Negative for: Wounds; Bleeding or bruising tendency; Breakdown; Swelling Musculoskeletal Complaints and Symptoms: Negative for: Muscle Pain; Muscle Weakness Neurologic Complaints and Symptoms: Negative for: Numbness/parasthesias; Focal/Weakness Psychiatric Complaints and Symptoms: Positive for: Anxiety Medical History: Past Medical History Notes: bipolar Respiratory Medical History: Positive for: Chronic Obstructive Pulmonary Disease (COPD) Cardiovascular Medical  History: Positive for: Coronary Artery Disease Endocrine Medical History: Positive for: Type II Diabetes Time with diabetes: 10 years Genitourinary Medical History: Past Medical History Notes: chronic kidney disease with history of kidney stones. Oncologic Immunizations Implantable Devices No devices added Family and Social History Current every day smoker - 1/2 ppd; Marital Status - Married; Alcohol Use: Never; Drug Use: No History; Caffeine Use: Moderate Electronic Signature([name]) Signed: 08/02/2023 4:42:07 PM By: Baltazar Najjar MD Signed: 08/03/2023 5:31:34 PM By: Midge Aver MSN RN CNS WTA Entered By: Midge Aver on 08/02/2023 09:51:30 SuperBill Details -------------------------------------------------------------------------------- Tama Headings ([id number]) [id number] Page 9 of 9 Patient Name: Date of Service: [name] NA LD-BO [name], Gabryella [name]. 08/02/2023 Medical Record Number: [id number] Patient Account Number: [id number] Date of Birth/Sex: Treating RN: [birth year] (46 y.o. 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 08/02/2023 12:30 PM Medical Record Number: [id number] Patient Account Number: [id number] Date of Birth/Sex: Treating RN: 07/28/1976 (47 y.o. 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 08/02/2023 12:30 PM Medical Record Number: [id number] Patient Account Number: [id number] Date of Birth/Sex: Treating RN: 04/21/1976 (47 y.o. 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 This was apparently a schwannoma according to the patient. She was admitted to hospital from 9/9 through 9/12 for surgical resection. She was discharged with a surgical wound that was sutured. The sutures were removed 2 weeks later and then about 3 or 4 days after that the wound dehisced. She was seen in the neurosurgery clinic on 10/3 with an open wound they gave her Keflex empirically which she has now finished and they used Medihoney to the midline wound. The patient has been applying this diligently. She has completed her antibiotics. The area is tender but no systemic symptoms. Past medical history includes bipolar disorder, hypertension, diabetes, coronary artery disease, COPD still smoking, she has recently had syncopal episodes and is wearing a heart monitor. Patient History Information obtained from Patient. Allergies Mounjaro, Ozempic, semaglutide Social History Current every day smoker - 1/2 ppd, Marital Status - Married, Alcohol Use - Never, Drug Use - No History, Caffeine Use - Moderate. Medical History Respiratory Patient has history of Chronic Obstructive Pulmonary Disease (COPD) Cardiovascular Patient has history of Coronary Artery  Disease Endocrine Patient has history of Type II Diabetes Medical A Surgical History Notes nd Genitourinary chronic kidney disease with history of kidney stones. Psychiatric bipolar Review of Systems (ROS) Constitutional Symptoms (General Health) Denies complaints or symptoms of Fatigue, Fever, Chills, Marked Weight Change. Eyes Complains or has symptoms of Glasses / Contacts. Ear/Nose/Mouth/Throat Denies complaints or symptoms of Difficult clearing ears, Sinusitis. Hematologic/Lymphatic Denies complaints or symptoms of Bleeding / Clotting Disorders, Human Immunodeficiency Virus. Immunological Denies complaints or symptoms of Hives, Itching. [name] ([id number]) [id number] Page 6 of 9 Integumentary (Skin) Denies complaints or symptoms of Wounds, Bleeding or bruising tendency, Breakdown, Swelling. Musculoskeletal Denies complaints or symptoms of Muscle Pain, Muscle Weakness. Neurologic Denies complaints or symptoms of Numbness/parasthesias, Focal/Weakness. Psychiatric Complains or has symptoms of Anxiety. Objective Constitutional Sitting or standing Blood Pressure is within target range for patient.. Pulse regular and within target range for patient.Marland Kitchen Respirations regular, non-labored and within target range.. Temperature is normal and within the target range for the patient.Marland Kitchen appears in no distress. Vitals

## 2023-08-06 ENCOUNTER — Encounter: Payer: Self-pay | Admitting: Family Medicine

## 2023-08-06 NOTE — Assessment & Plan Note (Signed)
Zio on now. Due to see cardiology. Seeing vestibular rehab, concern that this should not cause LOC. Continue to monitor and await cardiology's input.

## 2023-08-06 NOTE — Assessment & Plan Note (Addendum)
Will discuss starting wellbutrin with her psychiatrist. Continue to monitor. Will continue to cut down.

## 2023-08-08 DIAGNOSIS — T8131XA Disruption of external operation (surgical) wound, not elsewhere classified, initial encounter: Secondary | ICD-10-CM | POA: Diagnosis not present

## 2023-08-09 ENCOUNTER — Encounter: Payer: BC Managed Care – PPO | Admitting: Internal Medicine

## 2023-08-09 DIAGNOSIS — T8131XA Disruption of external operation (surgical) wound, not elsewhere classified, initial encounter: Secondary | ICD-10-CM | POA: Diagnosis not present

## 2023-08-09 NOTE — Progress Notes (Signed)
[name], FRANA [name] ([id number]) [id number] Page 1 of 4 Visit Report for 08/08/2023 Arrival Information Details Patient Name: Date of Service: [name] [name], [name]. 08/08/2023 11:30 A M Medical Record Number: [id number] Patient Account Number: [id number] Date of Birth/Sex: Treating RN: Mar 27, 1976 (46 y.o. [name] Primary Care Lynne Righi: Olevia Perches Other Clinician: Betha Loa Referring Skyy Nilan: Treating Kaydon Creedon/Extender: Marena Chancy Weeks in Treatment: 0 Visit Information History Since Last Visit All ordered tests and consults were completed: No Patient Arrived: Ambulatory Added or deleted any medications: No Arrival Time: 11:43 Any new allergies or adverse reactions: No Transfer Assistance: None Had a fall or experienced change in No Patient Identification Verified: Yes activities of daily living that may affect Secondary Verification Process Completed: Yes risk of falls: Patient Requires Transmission-Based Precautions: No Signs or symptoms of abuse/neglect since last visito No Patient Has Alerts: Yes Hospitalized since last visit: No Patient Alerts: Diabetic Implantable device outside of the clinic excluding No cellular tissue based products placed in the center since last visit: Has Dressing in Place as Prescribed: Yes Pain Present Now: Yes Electronic Signature([name]) Signed: 08/08/2023 4:53:28 PM By: Betha Loa Entered By: Betha Loa on 08/08/2023 12:06:09 -------------------------------------------------------------------------------- Clinic Level of Care Assessment Details Patient Name: Date of Service: [name] [name], [name]. 08/08/2023 11:30 A M Medical Record Number: [id number] Patient Account Number: [id number] Date of Birth/Sex: Treating RN: 1976/04/27 (46 y.o. [name] Primary Care Mckensie Scotti: Olevia Perches Other Clinician: Betha Loa Referring Janaria Mccammon: Treating  Rojean Ige/Extender: Marena Chancy Weeks in Treatment: 0 Clinic Level of Care Assessment Items TOOL 1 Quantity Score []  - 0 Use when EandM and Procedure is performed on INITIAL visit ASSESSMENTS - Nursing Assessment / Reassessment []  - 0 General Physical Exam (combine w/ comprehensive assessment (listed just below) when performed on new pt. evals) []  - 0 Comprehensive Assessment (HX, ROS, Risk Assessments, Wounds Hx, etc.) [name], [name] ([id number]) [id number] Page 2 of 4 ASSESSMENTS - Wound and Skin Assessment / Reassessment []  - 0 Dermatologic / Skin Assessment (not related to wound area) ASSESSMENTS - Ostomy and/or Continence Assessment and Care []  - 0 Incontinence Assessment and Management []  - 0 Ostomy Care Assessment and Management (repouching, etc.) PROCESS - Coordination of Care []  - 0 Simple Patient / Family Education for ongoing care []  - 0 Complex (extensive) Patient / Family Education for ongoing care []  - 0 Staff obtains Chiropractor, Records, T Results / Process Orders est []  - 0 Staff telephones HHA, Nursing Homes / Clarify orders / etc []  - 0 Routine Transfer to another Facility (non-emergent condition) []  - 0 Routine Hospital Admission (non-emergent condition) []  - 0 New Admissions / Manufacturing engineer / Ordering NPWT Apligraf, etc. , []  - 0 Emergency Hospital Admission (emergent condition) PROCESS - Special Needs []  - 0 Pediatric / Minor Patient Management []  - 0 Isolation Patient Management []  - 0 Hearing / Language / Visual special needs []  - 0 Assessment of Community assistance (transportation, D/C planning, etc.) []  - 0 Additional assistance / Altered mentation []  - 0 Support Surface([name]) Assessment (bed, cushion, seat, etc.) INTERVENTIONS - Miscellaneous []  - 0 External ear exam []  - 0 Patient Transfer (multiple staff / Nurse, adult / Similar devices) []  - 0 Simple Staple / Suture removal  (25 or less) []  - 0 Complex Staple / Suture removal (26 or more) []  - 0 Hypo/Hyperglycemic Management (do not check if billed separately) []  - 0 Ankle / Brachial Index (ABI) - do  not check if billed separately Has the patient been seen at the hospital within the last three years: Yes Total Score: 0 Level Of Care: ____ Electronic Signature([name]) Signed: 08/08/2023 4:53:28 PM By: Betha Loa Entered By: Betha Loa on 08/08/2023 12:07:59 -------------------------------------------------------------------------------- Encounter Discharge Information Details Patient Name: Date of Service: [name] [name], [name]. 08/08/2023 11:30 A M Medical Record Number: [id number] Patient Account Number: [id number] Date of Birth/Sex: Treating RN: [birth year] (46 y.o. [name] Primary Care Sears Oran: Olevia Perches Other Clinician: Betha Loa Referring Franziska Podgurski: Treating Jayra Choyce/Extender: Marena Chancy [place], Silvis [name] ([id number]) [id number] Page 3 of 4 Weeks in Treatment: 0 Encounter Discharge Information Items Discharge Condition: Stable Ambulatory Status: Ambulatory Discharge Destination: Home Transportation: Private Auto Accompanied By: self Schedule Follow-up Appointment: Yes Clinical Summary of Care: Electronic Signature([name]) Signed: 08/08/2023 4:53:28 PM By: Betha Loa Entered By: Betha Loa on 08/08/2023 12:05:22 -------------------------------------------------------------------------------- Wound Assessment Details Patient Name: Date of Service: [name] [name], [name]. 08/08/2023 11:30 A M Medical Record Number: [id number] Patient Account Number: [id number] Date of Birth/Sex: Treating RN: Feb 20, 1976 (46 y.o. [name] Primary Care Trevell Pariseau: Olevia Perches Other Clinician: Betha Loa Referring Ilanna Deihl: Treating Aijalon Kirtz/Extender: Marena Chancy Weeks in Treatment: 0 Wound  Status Wound Number: 1 Primary Open Surgical Wound Etiology: Wound Location: Midline Back Wound Open Wounding Event: Surgical Injury Status: Date Acquired: 06/26/2023 Comorbid Chronic Obstructive Pulmonary Disease (COPD), Coronary Weeks Of Treatment: 0 History: Artery Disease, Type II Diabetes Clustered Wound: No Wound Measurements Length: (cm) 13 Width: (cm) 1 Depth: (cm) 4 Area: (cm) 10.21 Volume: (cm) 40.841 % Reduction in Area: 0% % Reduction in Volume: 0% Epithelialization: None Wound Description Classification: Full Thickness Without Exposed Support Exudate Amount: Medium Exudate Type: Purulent Exudate Color: yellow, brown, green Structures Foul Odor After Cleansing: No Slough/Fibrino Yes Wound Bed Granulation Amount: Small (1-33%) Exposed Structure Granulation Quality: Pink, Pale Fascia Exposed: No Necrotic Amount: Large (67-100%) Fat Layer (Subcutaneous Tissue) Exposed: Yes Tendon Exposed: No Muscle Exposed: No Joint Exposed: No Bone Exposed: No Treatment Notes Wound #1 (Back) Wound Laterality: Midline Cleanser [name], Idaly [name] ([id number]) [id number] Page 4 of 4 Byram Ancillary Kit - 15 Day Supply Discharge Instruction: Use supplies as instructed; Kit contains: (15) Saline Bullets; (15) 3x3 Gauze; 15 pr Gloves Normal Saline Discharge Instruction: Wash your hands with soap and water. Remove old dressing, discard into plastic bag and place into trash. Cleanse the wound with Normal Saline prior to applying a clean dressing using gauze sponges, not tissues or cotton balls. Do not scrub or use excessive force. Pat dry using gauze sponges, not tissue or cotton balls. Vashe 5.8 (oz) Discharge Instruction: Use vashe 5.8 (oz) as directed Peri-Wound Care Topical Santyl Collagenase Ointment, 30 (gm), tube Discharge Instruction: apply nickel thick to wound bed only Primary Dressing Hydrofera Blue Ready Transfer Foam, 4x5  (in/in) Discharge Instruction: Apply Hydrofera Blue Ready to wound bed as directed Secondary Dressing ABD Pad 5x9 (in/in) Discharge Instruction: Cover with ABD pad Secured With Medipore T - 57M Medipore H Soft Cloth Surgical T ape ape, 2x2 (in/yd) Compression Wrap Compression Stockings Add-Ons Electronic Signature([name]) Signed: 08/08/2023 4:12:41 PM By: Midge Aver MSN RN CNS WTA Signed: 08/08/2023 4:53:28 PM By: Betha Loa Entered By: Betha Loa on 08/08/2023 11:47:30

## 2023-08-09 NOTE — Progress Notes (Addendum)
[name], [name] ([id number]) [id number] Page 1 of 3 Visit Report for 08/08/2023 Debridement Details Patient Name: Date of Service: [name] [name], [name]. 08/08/2023 11:30 A M Medical Record Number: [id number] Patient Account Number: [id number] Date of Birth/Sex: Treating RN: [birth year] (46 y.o. [name] Primary Care Provider: Olevia Perches Other Clinician: Betha Loa Referring Provider: Treating Provider/Extender: Marena Chancy Weeks in Treatment: 0 Debridement Performed for Assessment: Wound #1 Midline Back Performed By: Physician Allen Derry, PA-C Debridement Type: Chemical/Enzymatic/Mechanical Agent Used: Santyl Level of Consciousness (Pre-procedure): Awake and Alert Pre-procedure Verification/Time Out Yes - 11:56 Taken: Start Time: 11:56 Percent of Wound Bed Debrided: Instrument: Other : santyl Bleeding: None Response to Treatment: Procedure was tolerated well Level of Consciousness (Post- Awake and Alert procedure): Post Debridement Measurements of Total Wound Length: (cm) 13 Width: (cm) 1 Depth: (cm) 4 Volume: (cm) 40.841 Character of Wound/Ulcer Post Debridement: Stable Electronic Signature([name]) Signed: 08/08/2023 4:12:41 PM By: Midge Aver MSN RN CNS WTA Signed: 08/08/2023 4:48:34 PM By: Allen Derry PA-C Signed: 08/08/2023 4:53:28 PM By: Betha Loa Entered By: Betha Loa on 08/08/2023 09:07:02 -------------------------------------------------------------------------------- Physician Orders Details Patient Name: Date of Service: [name] [name], [name]. 08/08/2023 11:30 A M Medical Record Number: [id number] Patient Account Number: [id number] Date of Birth/Sex: Treating RN: 07-Feb-1976 (46 y.o. [name] Primary Care Provider: Olevia Perches Other Clinician: Betha Loa Referring Provider: Treating Provider/Extender: Marena Chancy Weeks in Treatment: 0 The  following information was scribed by: Betha Loa The information was scribed for: Jaquanda, Pirolli ([id number]) [id number] Page 2 of 3 Verbal / Phone Orders: Yes Clinician: Midge Aver Read Back and Verified: Yes Diagnosis Coding Follow-up Appointments Return Appointment in 1 week. Nurse Visit as needed Bathing/ Shower/ Hygiene Clean wound with Normal Saline or wound cleanser. Anesthetic (Use 'Patient Medications' Section for Anesthetic Order Entry) Lidocaine applied to wound bed Wound Treatment Wound #1 - Back Wound Laterality: Midline Cleanser: Byram Ancillary Kit - 15 Day Supply (Generic) 1 x Per Day/30 Days Discharge Instructions: Use supplies as instructed; Kit contains: (15) Saline Bullets; (15) 3x3 Gauze; 15 pr Gloves Cleanser: Normal Saline 1 x Per Day/30 Days Discharge Instructions: Wash your hands with soap and water. Remove old dressing, discard into plastic bag and place into trash. Cleanse the wound with Normal Saline prior to applying a clean dressing using gauze sponges, not tissues or cotton balls. Do not scrub or use excessive force. Pat dry using gauze sponges, not tissue or cotton balls. Cleanser: Vashe 5.8 (oz) 1 x Per Day/30 Days Discharge Instructions: Use vashe 5.8 (oz) as directed Topical: Santyl Collagenase Ointment, 30 (gm), tube 1 x Per Day/30 Days Discharge Instructions: apply nickel thick to wound bed only Prim Dressing: Hydrofera Blue Ready Transfer Foam, 4x5 (in/in) (Generic) 1 x Per Day/30 Days ary Discharge Instructions: Apply Hydrofera Blue Ready to wound bed as directed Secondary Dressing: ABD Pad 5x9 (in/in) (Generic) 1 x Per Day/30 Days Discharge Instructions: Cover with ABD pad Secured With: Medipore T - 54M Medipore H Soft Cloth Surgical T ape ape, 2x2 (in/yd) (Generic) 1 x Per Day/30 Days Electronic Signature([name]) Signed: 08/11/2023 10:28:30 AM By: Betha Loa Signed: 08/11/2023 1:31:37  PM By: Allen Derry PA-C Previous Signature: 08/08/2023 4:48:34 PM Version By: Allen Derry PA-C Previous Signature: 08/08/2023 4:53:28 PM Version By: Betha Loa Entered By: Betha Loa on 08/11/2023 05:01:21 -------------------------------------------------------------------------------- SuperBill Details Patient Name: Date of Service: [name] [name], Io [name]. 08/08/2023 Medical Record Number: [id number]  Patient Account Number: [id number] Date of Birth/Sex: Treating RN: August 27, 1976 (46 y.o. 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

## 2023-08-10 ENCOUNTER — Encounter: Payer: Self-pay | Admitting: Neurosurgery

## 2023-08-10 ENCOUNTER — Telehealth: Payer: Self-pay

## 2023-08-10 ENCOUNTER — Ambulatory Visit: Payer: BC Managed Care – PPO | Admitting: Neurosurgery

## 2023-08-10 VITALS — BP 134/79 | HR 89 | Temp 97.7°F | Ht 72.0 in | Wt 230.2 lb

## 2023-08-10 DIAGNOSIS — T8131XS Disruption of external operation (surgical) wound, not elsewhere classified, sequela: Secondary | ICD-10-CM

## 2023-08-10 DIAGNOSIS — T8131XA Disruption of external operation (surgical) wound, not elsewhere classified, initial encounter: Secondary | ICD-10-CM | POA: Diagnosis not present

## 2023-08-10 DIAGNOSIS — D497 Neoplasm of unspecified behavior of endocrine glands and other parts of nervous system: Secondary | ICD-10-CM

## 2023-08-10 NOTE — Telephone Encounter (Signed)
Noted.   I have also notified Lyla Son and [place]

## 2023-08-10 NOTE — Telephone Encounter (Signed)
I spoke with Katlynn from Wound Care and she stated that they saw the patient today and she stated that "they found Depth and Tunneling that goes up and down in her incision and she mentioned there was a pocket of fluid". She is supposed to be coming back tomorrow for another dressing change at 11:45am.   The PA would like to discuss this with you when you have a chance.

## 2023-08-10 NOTE — Progress Notes (Signed)
X- 1 5 Wound Imaging (photographs - any number of wounds) []  - 0 Wound Tracing (instead of photographs) X- 1 5 Simple Wound Measurement - one wound []  - 0 Complex Wound Measurement - multiple wounds INTERVENTIONS - Wound Dressings []  - 0 Small Wound Dressing one or multiple wounds X- 1 15 Medium Wound Dressing one or multiple wounds []  - 0 Large Wound Dressing one or multiple wounds []  - 0 Application of Medications - topical []  - 0 Application of Medications - injection INTERVENTIONS - Miscellaneous []  - 0 External ear exam []  - 0 Specimen Collection (cultures, biopsies, blood, body fluids, etc.) []  - 0 Specimen([name]) / Culture([name]) sent or taken to Lab for analysis [name] ([id number]) [id number] Page 3 of 5 []  - 0 Patient Transfer (multiple staff / Nurse, adult / Similar devices) []  - 0 Simple Staple / Suture removal (25 or less) []  - 0 Complex Staple / Suture removal (26 or more) []  - 0 Hypo / Hyperglycemic Management (close monitor of Blood Glucose) []  - 0 Ankle / Brachial Index (ABI) - do not check if billed separately []  - 0 Vital Signs Has the patient been seen at the hospital within the last three years: Yes Total Score: 85 Level Of Care: New/Established - Level 3 Electronic Signature([name]) Signed: 08/10/2023 4:44:43 PM By: Angelina Pih Entered By: Angelina Pih on 08/10/2023 16:15:37 -------------------------------------------------------------------------------- Encounter Discharge Information Details Patient Name: Date of Service: [name], [name]. 08/10/2023 3:45 PM Medical Record Number: [id number] Patient Account Number: [id number] Date of  Birth/Sex: Treating RN: 1976/06/27 (47 y.o. [name] Primary Care Yulonda Wheeling: [name] Other Clinician: Referring [name]: Treating [name]/Extender: [name] Weeks in Treatment: 1 Encounter Discharge Information Items Discharge Condition: Stable Ambulatory Status: Ambulatory Discharge Destination: Home Transportation: Private Auto Accompanied By: self Schedule Follow-up Appointment: Yes Clinical Summary of Care: Electronic Signature([name]) Signed: 08/10/2023 4:15:12 PM By: Angelina Pih Entered By: Angelina Pih on 08/10/2023 16:15:11 -------------------------------------------------------------------------------- Wound Assessment Details Patient Name: Date of Service: [name], [name]. 08/10/2023 3:45 PM Medical Record Number: [id number] Patient Account Number: [id number] Date of Birth/Sex: Treating RN: 1975/12/29 (47 y.o. [name] Primary Care [name]: [name] Other Clinician: [name], [name] ([id number]) [id number] Page 4 of 5 Referring Daniella Dewberry: Treating Rebeka [name]/Extender: [name] Weeks in Treatment: 1 Wound Status Wound Number: 1 Primary Open Surgical Wound Etiology: Wound Location: Midline Back Wound Open Wounding Event: Surgical Injury Status: Date Acquired: 06/26/2023 Comorbid Chronic Obstructive Pulmonary Disease (COPD), Coronary Weeks Of Treatment: 1 History: Artery Disease, Type II Diabetes Clustered Wound: No Wound Measurements Length: (cm) 8.5 Width: (cm) 1.5 Depth: (cm) 2.5 Area: (cm) 10.014 Volume: (cm) 25.035 % Reduction in Area: 1.9% % Reduction in Volume: 38.7% Epithelialization: None Wound Description Classification: Full Thickness Without Exposed Suppor Exudate Amount: Medium Exudate Type: Purulent Exudate Color: yellow, brown, green t Structures Foul Odor After Cleansing: No Slough/Fibrino Yes Wound Bed Granulation  Amount: Small (1-33%) Exposed Structure Granulation Quality: Pink, Pale Fascia Exposed: No Necrotic Amount: Large (67-100%) Fat Layer (Subcutaneous Tissue) Exposed: Yes Tendon Exposed: No Muscle Exposed: No Joint Exposed: No Bone Exposed: No Assessment Notes NV tunneling found going up at 12 o'clock and down at 6 o'clock, fluid pocket drained, full HB rope placed in lower tunneling Treatment Notes Wound #1 (Back) Wound Laterality: Midline Cleanser Byram Ancillary Kit - 15 Day Supply Discharge Instruction: Use supplies as instructed; Kit contains: (15) Saline Bullets; (15) 3x3 Gauze; 15 pr Gloves Normal  [name], HALLAWAY [name] ([id number]) [id number] Page 1 of 5 Visit Report for 08/10/2023 Arrival Information Details Patient Name: Date of Service: [name] NA LD-BO [name], [name] 08/10/2023 3:45 PM Medical Record Number: [id number] Patient Account Number: [id number] Date of Birth/Sex: Treating RN: 1976-03-12 (47 y.o. [name] Primary Care [name]: [name] Other Clinician: Referring [name]: Treating [name]/Extender: [name] Weeks in Treatment: 1 Visit Information History Since Last Visit Added or deleted any medications: No Patient Arrived: Ambulatory Any new allergies or adverse reactions: No Arrival Time: 15:58 Had a fall or experienced change in No Accompanied By: self activities of daily living that may affect Transfer Assistance: None risk of falls: Patient Identification Verified: Yes Hospitalized since last visit: No Secondary Verification Process Completed: Yes Has Dressing in Place as Prescribed: Yes Patient Requires Transmission-Based Precautions: No Pain Present Now: No Patient Has Alerts: Yes Patient Alerts: Diabetic Electronic Signature([name]) Signed: 08/10/2023 4:11:29 PM By: Angelina Pih Previous Signature: 08/10/2023 3:59:43 PM Version By: Angelina Pih Entered By: Angelina Pih on 08/10/2023 16:11:29 -------------------------------------------------------------------------------- Clinic Level of Care Assessment Details Patient Name: Date of Service: [name], [name]. 08/10/2023 3:45 PM Medical Record Number: [id number] Patient Account Number: [id number] Date of Birth/Sex: Treating RN: May 22, 1976 (47 y.o. [name] Primary Care [name]: [name] Other Clinician: Referring [name]: Treating [name]/Extender: [name] Weeks in Treatment: 1 Clinic Level of Care Assessment Items TOOL 4 Quantity Score []  - 0 Use when only an EandM is  performed on FOLLOW-UP visit ASSESSMENTS - Nursing Assessment / Reassessment X- 1 10 Reassessment of Co-morbidities (includes updates in patient status) X- 1 5 Reassessment of Adherence to Treatment Plan ASSESSMENTS - Wound and Skin A ssessment / Reassessment X - Simple Wound Assessment / Reassessment - one wound 1 5 [name], [name] ([id number]) [id number] Page 2 of 5 []  - 0 Complex Wound Assessment / Reassessment - multiple wounds []  - 0 Dermatologic / Skin Assessment (not related to wound area) ASSESSMENTS - Focused Assessment []  - 0 Circumferential Edema Measurements - multi extremities []  - 0 Nutritional Assessment / Counseling / Intervention []  - 0 Lower Extremity Assessment (monofilament, tuning fork, pulses) []  - 0 Peripheral Arterial Disease Assessment (using hand held doppler) ASSESSMENTS - Ostomy and/or Continence Assessment and Care []  - 0 Incontinence Assessment and Management []  - 0 Ostomy Care Assessment and Management (repouching, etc.) PROCESS - Coordination of Care X - Simple Patient / Family Education for ongoing care 1 15 []  - 0 Complex (extensive) Patient / Family Education for ongoing care X- 1 10 Staff obtains Chiropractor, Records, T Results / Process Orders est []  - 0 Staff telephones HHA, Nursing Homes / Clarify orders / etc []  - 0 Routine Transfer to another Facility (non-emergent condition) []  - 0 Routine Hospital Admission (non-emergent condition) []  - 0 New Admissions / Manufacturing engineer / Ordering NPWT Apligraf, etc. , []  - 0 Emergency Hospital Admission (emergent condition) X- 1 10 Simple Discharge Coordination []  - 0 Complex (extensive) Discharge Coordination PROCESS - Special Needs []  - 0 Pediatric / Minor Patient Management []  - 0 Isolation Patient Management []  - 0 Hearing / Language / Visual special needs []  - 0 Assessment of Community assistance (transportation, D/C planning,  etc.) []  - 0 Additional assistance / Altered mentation []  - 0 Support Surface([name]) Assessment (bed, cushion, seat, etc.) INTERVENTIONS - Wound Cleansing / Measurement X - Simple Wound Cleansing - one wound 1 5 []  - 0 Complex Wound Cleansing - multiple wounds  X- 1 5 Wound Imaging (photographs - any number of wounds) []  - 0 Wound Tracing (instead of photographs) X- 1 5 Simple Wound Measurement - one wound []  - 0 Complex Wound Measurement - multiple wounds INTERVENTIONS - Wound Dressings []  - 0 Small Wound Dressing one or multiple wounds X- 1 15 Medium Wound Dressing one or multiple wounds []  - 0 Large Wound Dressing one or multiple wounds []  - 0 Application of Medications - topical []  - 0 Application of Medications - injection INTERVENTIONS - Miscellaneous []  - 0 External ear exam []  - 0 Specimen Collection (cultures, biopsies, blood, body fluids, etc.) []  - 0 Specimen([name]) / Culture([name]) sent or taken to Lab for analysis [name] ([id number]) [id number] Page 3 of 5 []  - 0 Patient Transfer (multiple staff / Nurse, adult / Similar devices) []  - 0 Simple Staple / Suture removal (25 or less) []  - 0 Complex Staple / Suture removal (26 or more) []  - 0 Hypo / Hyperglycemic Management (close monitor of Blood Glucose) []  - 0 Ankle / Brachial Index (ABI) - do not check if billed separately []  - 0 Vital Signs Has the patient been seen at the hospital within the last three years: Yes Total Score: 85 Level Of Care: New/Established - Level 3 Electronic Signature([name]) Signed: 08/10/2023 4:44:43 PM By: Angelina Pih Entered By: Angelina Pih on 08/10/2023 16:15:37 -------------------------------------------------------------------------------- Encounter Discharge Information Details Patient Name: Date of Service: [name], [name]. 08/10/2023 3:45 PM Medical Record Number: [id number] Patient Account Number: [id number] Date of  Birth/Sex: Treating RN: 1976/06/27 (47 y.o. [name] Primary Care Yulonda Wheeling: [name] Other Clinician: Referring [name]: Treating [name]/Extender: [name] Weeks in Treatment: 1 Encounter Discharge Information Items Discharge Condition: Stable Ambulatory Status: Ambulatory Discharge Destination: Home Transportation: Private Auto Accompanied By: self Schedule Follow-up Appointment: Yes Clinical Summary of Care: Electronic Signature([name]) Signed: 08/10/2023 4:15:12 PM By: Angelina Pih Entered By: Angelina Pih on 08/10/2023 16:15:11 -------------------------------------------------------------------------------- Wound Assessment Details Patient Name: Date of Service: [name], [name]. 08/10/2023 3:45 PM Medical Record Number: [id number] Patient Account Number: [id number] Date of Birth/Sex: Treating RN: 1975/12/29 (47 y.o. [name] Primary Care [name]: [name] Other Clinician: [name], [name] ([id number]) [id number] Page 4 of 5 Referring Daniella Dewberry: Treating Rebeka [name]/Extender: [name] Weeks in Treatment: 1 Wound Status Wound Number: 1 Primary Open Surgical Wound Etiology: Wound Location: Midline Back Wound Open Wounding Event: Surgical Injury Status: Date Acquired: 06/26/2023 Comorbid Chronic Obstructive Pulmonary Disease (COPD), Coronary Weeks Of Treatment: 1 History: Artery Disease, Type II Diabetes Clustered Wound: No Wound Measurements Length: (cm) 8.5 Width: (cm) 1.5 Depth: (cm) 2.5 Area: (cm) 10.014 Volume: (cm) 25.035 % Reduction in Area: 1.9% % Reduction in Volume: 38.7% Epithelialization: None Wound Description Classification: Full Thickness Without Exposed Suppor Exudate Amount: Medium Exudate Type: Purulent Exudate Color: yellow, brown, green t Structures Foul Odor After Cleansing: No Slough/Fibrino Yes Wound Bed Granulation  Amount: Small (1-33%) Exposed Structure Granulation Quality: Pink, Pale Fascia Exposed: No Necrotic Amount: Large (67-100%) Fat Layer (Subcutaneous Tissue) Exposed: Yes Tendon Exposed: No Muscle Exposed: No Joint Exposed: No Bone Exposed: No Assessment Notes NV tunneling found going up at 12 o'clock and down at 6 o'clock, fluid pocket drained, full HB rope placed in lower tunneling Treatment Notes Wound #1 (Back) Wound Laterality: Midline Cleanser Byram Ancillary Kit - 15 Day Supply Discharge Instruction: Use supplies as instructed; Kit contains: (15) Saline Bullets; (15) 3x3 Gauze; 15 pr Gloves Normal

## 2023-08-10 NOTE — Progress Notes (Addendum)
   REFERRING PHYSICIAN:  Rosario Adie [address] [place],  [place] [address]  DOS: 06/26/23 L1-2 laminectomy for tumor resection  Pathology: Schwannoma  HISTORY OF PRESENT ILLNESS: [name] is here for follow-up.  She is being seen in the wound clinic, where her incision is making appropriate forward progress.  She has no additional symptoms.  PHYSICAL EXAMINATION:  General: Patient is well developed, well nourished, calm, collected, and in no apparent distress.   NEUROLOGICAL:  General: In no acute distress.   Awake, alert, oriented to person, place, and time.  Pupils equal round and reactive to light.    Strength:         Side Iliopsoas Quads Hamstring PF DF EHL  R 5 5 5 5 5 5   L 5 5 5 5 5 5    Incision-deferred to wound care clinic  ROS (Neurologic):  Negative except as noted above  IMAGING: No interval imaging to review   ASSESSMENT/PLAN:  [name] is doing well after schwannoma resection.  She is suffering from some delayed wound healing.  She has made significant for progress with the care of the wound care clinic.  I am optimistic that this will heal.  We reviewed activity limitations.  I have recommended that she refrain from strenuous activities more than walking until her wound is healed.  She is not ready to return to work.  I will see her back in clinic in approximately 6 weeks.  At that visit, we will order an MRI scan of the lumbar spine with and without contrast.   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  She was examined in the wound clinic today.  She has an extensive subdermal collection that is sterile.  There is tracking from an area of dehiscence.  Because she is not showing good signs of healing from this, the wound care clinic has recommended open debridement and repair..  We will start oral antibiotics to prevent an infection and plan for complex repair of wound dehiscence early  next week.

## 2023-08-11 ENCOUNTER — Telehealth: Payer: Self-pay | Admitting: Family Medicine

## 2023-08-11 ENCOUNTER — Telehealth: Payer: Self-pay

## 2023-08-11 ENCOUNTER — Encounter: Payer: Self-pay | Admitting: Family Medicine

## 2023-08-11 ENCOUNTER — Other Ambulatory Visit: Payer: Self-pay

## 2023-08-11 ENCOUNTER — Encounter
Admission: RE | Admit: 2023-08-11 | Discharge: 2023-08-11 | Disposition: A | Discharge status: Home / Self Care | Payer: BC Managed Care – PPO | Source: Ambulatory Visit | Attending: Neurosurgery | Admitting: Neurosurgery

## 2023-08-11 DIAGNOSIS — T8131XA Disruption of external operation (surgical) wound, not elsewhere classified, initial encounter: Secondary | ICD-10-CM | POA: Diagnosis not present

## 2023-08-11 DIAGNOSIS — Z01818 Encounter for other preprocedural examination: Secondary | ICD-10-CM

## 2023-08-11 MED ORDER — BUPROPION HCL ER (SR) 150 MG PO TB12: ORAL_TABLET | ORAL | 2 refills | Status: DC

## 2023-08-11 NOTE — H&P (View-Only) (Signed)
AMARRI, ISLES [name] ([id number]) [id number] Page 1 of 2 Visit Report for 08/10/2023 Physician Orders Details Patient Name: Date of Service: [name] [name], [name] 08/10/2023 3:45 PM Medical Record Number: [id number] Patient Account Number: [id number] Date of Birth/Sex: Treating RN: 06/17/1976 (47 y.o. [name] Primary Care Provider: Olevia Perches Other Clinician: Referring Provider: Treating Provider/Extender: Marena Chancy Weeks in Treatment: 1 The following information was scribed by: Angelina Pih The information was scribed for: Allen Derry Verbal / Phone Orders: No Diagnosis Coding Follow-up Appointments Return Appointment in 1 week. Nurse Visit as needed - PRN Bathing/ Shower/ Hygiene Clean wound with Normal Saline or wound cleanser. Anesthetic (Use 'Patient Medications' Section for Anesthetic Order Entry) Lidocaine applied to wound bed Wound Treatment Wound #1 - Back Wound Laterality: Midline Cleanser: Byram Ancillary Kit - 15 Day Supply (Generic) 1 x Per Day/30 Days Discharge Instructions: Use supplies as instructed; Kit contains: (15) Saline Bullets; (15) 3x3 Gauze; 15 pr Gloves Cleanser: Normal Saline 1 x Per Day/30 Days Discharge Instructions: Wash your hands with soap and water. Remove old dressing, discard into plastic bag and place into trash. Cleanse the wound with Normal Saline prior to applying a clean dressing using gauze sponges, not tissues or cotton balls. Do not scrub or use excessive force. Pat dry using gauze sponges, not tissue or cotton balls. Cleanser: Vashe 5.8 (oz) 1 x Per Day/30 Days Discharge Instructions: Use vashe 5.8 (oz) as directed Topical: Santyl Collagenase Ointment, 30 (gm), tube 1 x Per Day/30 Days Discharge Instructions: apply nickel thick to wound bed only Prim Dressing: Hydrofera Blue Ready Transfer Foam, 4x5 (in/in) (Generic) 1 x Per Day/30 Days ary Discharge  Instructions: Apply Hydrofera Blue Ready to wound bed as directed Prim Dressing: Hydrofera Blue Classic Foam Rope Dressing, 9x6 (mm/in) 1 x Per Day/30 Days ary Discharge Instructions: cut in half, full length inserted at downward angle Secondary Dressing: ABD Pad 5x9 (in/in) (Generic) 1 x Per Day/30 Days Discharge Instructions: Cover with ABD pad Secured With: Medipore T - 41M Medipore H Soft Cloth Surgical T ape ape, 2x2 (in/yd) (Generic) 1 x Per Day/30 Days Patient Medications llergies: Mounjaro, Ozempic, semaglutide A Notifications Medication Indication Start End 08/10/2023 amoxicillin-pot clavulanate DOSE 1 - oral 875 mg-125 mg tablet - 1 tablet oral twice a day x 14 days [name] ([id number]) [phone].pdf Page 2 of 2 Electronic Signature([name]) Signed: 08/11/2023 12:09:00 PM By: Angelina Pih Signed: 08/11/2023 1:31:37 PM By: Allen Derry PA-C Previous Signature: 08/11/2023 12:08:43 PM Version By: Angelina Pih Previous Signature: 08/10/2023 5:45:09 PM Version By: Allen Derry PA-C Previous Signature: 08/10/2023 4:14:52 PM Version By: Angelina Pih Entered By: Angelina Pih on 08/11/2023 09:09:00 -------------------------------------------------------------------------------- SuperBill Details Patient Name: Date of Service: [name] [name]. 08/10/2023 Medical Record Number: [id number] Patient Account Number: [id number] Date of Birth/Sex: Treating RN: 08-27-1976 (47 y.o. 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

## 2023-08-11 NOTE — Telephone Encounter (Signed)
Addendum - rybelsus only needs to be held for 1 day. The patient was notified by [facility] in PAT.

## 2023-08-11 NOTE — Progress Notes (Signed)
[name], ASPINALL [name] ([id number]) [id number] Page 1 of 5 Visit Report for 08/11/2023 Arrival Information Details Patient Name: Date of Service: [name] [name], [name]. 08/11/2023 11:45 A M Medical Record Number: [id number] Patient Account Number: [id number] Date of Birth/Sex: Treating RN: November 03, 1975 (46 y.o. [name] Primary Care [name]: [name] Other Clinician: Referring [name]: Treating [name]/Extender: [name] in Treatment: 1 Visit Information History Since Last Visit Added or deleted any medications: No Patient Arrived: Ambulatory Any new allergies or adverse reactions: No Arrival Time: 11:33 Had a fall or experienced change in No Accompanied By: father activities of daily living that may affect Transfer Assistance: None risk of falls: Patient Identification Verified: Yes Signs or symptoms of abuse/neglect since last visito No Secondary Verification Process Completed: Yes Hospitalized since last visit: No Patient Requires Transmission-Based Precautions: No Implantable device outside of the clinic excluding No Patient Has Alerts: Yes cellular tissue based products placed in the center Patient Alerts: Diabetic since last visit: Has Dressing in Place as Prescribed: Yes Pain Present Now: No Electronic Signature([name]) Signed: 08/11/2023 12:09:11 PM By: [name] Pax RN Entered By: [name] on 08/11/2023 12:09:10 -------------------------------------------------------------------------------- Clinic Level of Care Assessment Details Patient Name: Date of Service: [name] [name], [name]. 08/11/2023 11:45 A M Medical Record Number: [id number] Patient Account Number: [id number] Date of Birth/Sex: Treating RN: 12-03-1975 (46 y.o. [name] Primary Care [name]: [name] Other Clinician: Referring [name]: Treating [name]/Extender: [name] in  Treatment: 1 Clinic Level of Care Assessment Items TOOL 4 Quantity Score X- 1 0 Use when only an EandM is performed on FOLLOW-UP visit ASSESSMENTS - Nursing Assessment / Reassessment X- 1 10 Reassessment of Co-morbidities (includes updates in patient status) X- 1 5 Reassessment of Adherence to Treatment Plan [name], Broussard [name] ([id number]) [id number] Page 2 of 5 ASSESSMENTS - Wound and Skin A ssessment / Reassessment X - Simple Wound Assessment / Reassessment - one wound 1 5 []  - 0 Complex Wound Assessment / Reassessment - multiple wounds []  - 0 Dermatologic / Skin Assessment (not related to wound area) ASSESSMENTS - Focused Assessment []  - 0 Circumferential Edema Measurements - multi extremities []  - 0 Nutritional Assessment / Counseling / Intervention []  - 0 Lower Extremity Assessment (monofilament, tuning fork, pulses) []  - 0 Peripheral Arterial Disease Assessment (using hand held doppler) ASSESSMENTS - Ostomy and/or Continence Assessment and Care []  - 0 Incontinence Assessment and Management []  - 0 Ostomy Care Assessment and Management (repouching, etc.) PROCESS - Coordination of Care X - Simple Patient / Family Education for ongoing care 1 15 []  - 0 Complex (extensive) Patient / Family Education for ongoing care []  - 0 Staff obtains Chiropractor, Records, T Results / Process Orders est []  - 0 Staff telephones HHA, Nursing Homes / Clarify orders / etc []  - 0 Routine Transfer to another Facility (non-emergent condition) []  - 0 Routine Hospital Admission (non-emergent condition) []  - 0 New Admissions / Manufacturing engineer / Ordering NPWT Apligraf, etc. , []  - 0 Emergency Hospital Admission (emergent condition) X- 1 10 Simple Discharge Coordination []  - 0 Complex (extensive) Discharge Coordination PROCESS - Special Needs []  - 0 Pediatric / Minor Patient Management []  - 0 Isolation Patient Management []  - 0 Hearing /  Language / Visual special needs []  - 0 Assessment of Community assistance (transportation, D/C planning, etc.) []  - 0 Additional assistance / Altered mentation []  - 0 Support Surface([name]) Assessment (bed, cushion, seat, etc.) INTERVENTIONS - Wound  [name], ASPINALL [name] ([id number]) [id number] Page 1 of 5 Visit Report for 08/11/2023 Arrival Information Details Patient Name: Date of Service: [name] [name], [name]. 08/11/2023 11:45 A M Medical Record Number: [id number] Patient Account Number: [id number] Date of Birth/Sex: Treating RN: November 03, 1975 (46 y.o. [name] Primary Care [name]: [name] Other Clinician: Referring [name]: Treating [name]/Extender: [name] in Treatment: 1 Visit Information History Since Last Visit Added or deleted any medications: No Patient Arrived: Ambulatory Any new allergies or adverse reactions: No Arrival Time: 11:33 Had a fall or experienced change in No Accompanied By: father activities of daily living that may affect Transfer Assistance: None risk of falls: Patient Identification Verified: Yes Signs or symptoms of abuse/neglect since last visito No Secondary Verification Process Completed: Yes Hospitalized since last visit: No Patient Requires Transmission-Based Precautions: No Implantable device outside of the clinic excluding No Patient Has Alerts: Yes cellular tissue based products placed in the center Patient Alerts: Diabetic since last visit: Has Dressing in Place as Prescribed: Yes Pain Present Now: No Electronic Signature([name]) Signed: 08/11/2023 12:09:11 PM By: [name] Pax RN Entered By: [name] on 08/11/2023 12:09:10 -------------------------------------------------------------------------------- Clinic Level of Care Assessment Details Patient Name: Date of Service: [name] [name], [name]. 08/11/2023 11:45 A M Medical Record Number: [id number] Patient Account Number: [id number] Date of Birth/Sex: Treating RN: 12-03-1975 (46 y.o. [name] Primary Care [name]: [name] Other Clinician: Referring [name]: Treating [name]/Extender: [name] in  Treatment: 1 Clinic Level of Care Assessment Items TOOL 4 Quantity Score X- 1 0 Use when only an EandM is performed on FOLLOW-UP visit ASSESSMENTS - Nursing Assessment / Reassessment X- 1 10 Reassessment of Co-morbidities (includes updates in patient status) X- 1 5 Reassessment of Adherence to Treatment Plan [name], Broussard [name] ([id number]) [id number] Page 2 of 5 ASSESSMENTS - Wound and Skin A ssessment / Reassessment X - Simple Wound Assessment / Reassessment - one wound 1 5 []  - 0 Complex Wound Assessment / Reassessment - multiple wounds []  - 0 Dermatologic / Skin Assessment (not related to wound area) ASSESSMENTS - Focused Assessment []  - 0 Circumferential Edema Measurements - multi extremities []  - 0 Nutritional Assessment / Counseling / Intervention []  - 0 Lower Extremity Assessment (monofilament, tuning fork, pulses) []  - 0 Peripheral Arterial Disease Assessment (using hand held doppler) ASSESSMENTS - Ostomy and/or Continence Assessment and Care []  - 0 Incontinence Assessment and Management []  - 0 Ostomy Care Assessment and Management (repouching, etc.) PROCESS - Coordination of Care X - Simple Patient / Family Education for ongoing care 1 15 []  - 0 Complex (extensive) Patient / Family Education for ongoing care []  - 0 Staff obtains Chiropractor, Records, T Results / Process Orders est []  - 0 Staff telephones HHA, Nursing Homes / Clarify orders / etc []  - 0 Routine Transfer to another Facility (non-emergent condition) []  - 0 Routine Hospital Admission (non-emergent condition) []  - 0 New Admissions / Manufacturing engineer / Ordering NPWT Apligraf, etc. , []  - 0 Emergency Hospital Admission (emergent condition) X- 1 10 Simple Discharge Coordination []  - 0 Complex (extensive) Discharge Coordination PROCESS - Special Needs []  - 0 Pediatric / Minor Patient Management []  - 0 Isolation Patient Management []  - 0 Hearing /  Language / Visual special needs []  - 0 Assessment of Community assistance (transportation, D/C planning, etc.) []  - 0 Additional assistance / Altered mentation []  - 0 Support Surface([name]) Assessment (bed, cushion, seat, etc.) INTERVENTIONS - Wound  Cleansing / Measurement X - Simple Wound Cleansing - one wound 1 5 []  - 0 Complex Wound Cleansing - multiple wounds X- 1 5 Wound Imaging (photographs - any number of wounds) []  - 0 Wound Tracing (instead of photographs) X- 1 5 Simple Wound Measurement - one wound []  - 0 Complex Wound Measurement - multiple wounds INTERVENTIONS - Wound Dressings X - Small Wound Dressing one or multiple wounds 1 10 []  - 0 Medium Wound Dressing one or multiple wounds []  - 0 Large Wound Dressing one or multiple wounds []  - 0 Application of Medications - topical []  - 0 Application of Medications - injection INTERVENTIONS - Miscellaneous []  - 0 External ear exam [name], [name] ([id number]) [id number] Page 3 of 5 []  - 0 Specimen Collection (cultures, biopsies, blood, body fluids, etc.) []  - 0 Specimen([name]) / Culture([name]) sent or taken to Lab for analysis []  - 0 Patient Transfer (multiple staff / Michiel Sites Lift / Similar devices) []  - 0 Simple Staple / Suture removal (25 or less) []  - 0 Complex Staple / Suture removal (26 or more) []  - 0 Hypo / Hyperglycemic Management (close monitor of Blood Glucose) []  - 0 Ankle / Brachial Index (ABI) - do not check if billed separately X- 1 5 Vital Signs Has the patient been seen at the hospital within the last three years: Yes Total Score: 75 Level Of Care: New/Established - Level 2 Electronic Signature([name]) Unsigned Entered ByYevonne [name] on 08/11/2023 12:25:57 -------------------------------------------------------------------------------- Encounter Discharge Information Details Patient Name: Date of Service: [name] [name], [name]. 08/11/2023 11:45 A M Medical  Record Number: [id number] Patient Account Number: [id number] Date of Birth/Sex: Treating RN: Feb 12, 1976 (46 y.o. [name] Primary Care Joanie Duprey: [name] Other Clinician: Referring Zanita Millman: Treating Avi Archuleta/Extender: [name] in Treatment: 1 Encounter Discharge Information Items Discharge Condition: Stable Ambulatory Status: Ambulatory Discharge Destination: Home Transportation: Private Auto Accompanied By: father Schedule Follow-up Appointment: Yes Clinical Summary of Care: Electronic Signature([name]) Unsigned Entered ByYevonne [name] on 08/11/2023 12:25:22 Signature([name]): [name], [name] ([id number]) [phone] Date([name]): [id number] Page 4 of 5 -------------------------------------------------------------------------------- Wound Assessment Details Patient Name: Date of Service: [name] [name], [name]. 08/11/2023 11:45 A M Medical Record Number: [id number] Patient Account Number: [id number] Date of Birth/Sex: Treating RN: 09-09-1976 (46 y.o. [name] Primary Care Yara Tomkinson: [name] Other Clinician: Referring Jennavecia Schwier: Treating Annasofia Pohl/Extender: [name] in Treatment: 1 Wound Status Wound Number: 1 Primary Open Surgical Wound Etiology: Wound Location: Midline Back Wound Open Wounding Event: Surgical Injury Status: Date Acquired: 06/26/2023 Comorbid Chronic Obstructive Pulmonary Disease (COPD), Coronary [name] Of Treatment: 1 History: Artery Disease, Type II Diabetes Clustered Wound: No Wound Measurements Length: (cm) 8.5 Width: (cm) 1.5 Depth: (cm) 2.5 Area: (cm) 10.014 Volume: (cm) 25.035 % Reduction in Area: 1.9% % Reduction in Volume: 38.7% Epithelialization: None Wound Description Classification: Full Thickness Without Exposed Support Exudate Amount: Medium Exudate Type: Serosanguineous Exudate Color: red, brown Structures Foul Odor After Cleansing:  No Slough/Fibrino Yes Wound Bed Granulation Amount: Medium (34-66%) Exposed Structure Granulation Quality: Pink, Pale Fascia Exposed: No Necrotic Amount: Medium (34-66%) Fat Layer (Subcutaneous Tissue) Exposed: Yes Necrotic Quality: Adherent Slough Tendon Exposed: No Muscle Exposed: No Joint Exposed: No Bone Exposed: No Treatment Notes Wound #1 (Back) Wound Laterality: Midline Cleanser Byram Ancillary Kit - 15 Day Supply Discharge Instruction: Use supplies as instructed; Kit contains: (15) Saline Bullets; (15) 3x3 Gauze; 15 pr Gloves Normal Saline Discharge Instruction: Wash your hands with

## 2023-08-11 NOTE — Progress Notes (Addendum)
AMARRI, ISLES [name] ([id number]) [id number] Page 1 of 2 Visit Report for 08/10/2023 Physician Orders Details Patient Name: Date of Service: [name] 08/10/2023 3:45 PM Medical Record Number: [id number] Patient Account Number: [id number] Date of Birth/Sex: Treating RN: 06/17/1976 (46 y.o. [name] Primary Care Provider: Olevia Perches Other Clinician: Referring Provider: Treating Provider/Extender: Marena Chancy Weeks in Treatment: 1 The following information was scribed by: Angelina Pih The information was scribed for: Allen Derry Verbal / Phone Orders: No Diagnosis Coding Follow-up Appointments Return Appointment in 1 week. Nurse Visit as needed - PRN Bathing/ Shower/ Hygiene Clean wound with Normal Saline or wound cleanser. Anesthetic (Use 'Patient Medications' Section for Anesthetic Order Entry) Lidocaine applied to wound bed Wound Treatment Wound #1 - Back Wound Laterality: Midline Cleanser: Byram Ancillary Kit - 15 Day Supply (Generic) 1 x Per Day/30 Days Discharge Instructions: Use supplies as instructed; Kit contains: (15) Saline Bullets; (15) 3x3 Gauze; 15 pr Gloves Cleanser: Normal Saline 1 x Per Day/30 Days Discharge Instructions: Wash your hands with soap and water. Remove old dressing, discard into plastic bag and place into trash. Cleanse the wound with Normal Saline prior to applying a clean dressing using gauze sponges, not tissues or cotton balls. Do not scrub or use excessive force. Pat dry using gauze sponges, not tissue or cotton balls. Cleanser: Vashe 5.8 (oz) 1 x Per Day/30 Days Discharge Instructions: Use vashe 5.8 (oz) as directed Topical: Santyl Collagenase Ointment, 30 (gm), tube 1 x Per Day/30 Days Discharge Instructions: apply nickel thick to wound bed only Prim Dressing: Hydrofera Blue Ready Transfer Foam, 4x5 (in/in) (Generic) 1 x Per Day/30 Days ary Discharge  Instructions: Apply Hydrofera Blue Ready to wound bed as directed Prim Dressing: Hydrofera Blue Classic Foam Rope Dressing, 9x6 (mm/in) 1 x Per Day/30 Days ary Discharge Instructions: cut in half, full length inserted at downward angle Secondary Dressing: ABD Pad 5x9 (in/in) (Generic) 1 x Per Day/30 Days Discharge Instructions: Cover with ABD pad Secured With: Medipore T - 41M Medipore H Soft Cloth Surgical T ape ape, 2x2 (in/yd) (Generic) 1 x Per Day/30 Days Patient Medications llergies: Mounjaro, Ozempic, semaglutide A Notifications Medication Indication Start End 08/10/2023 amoxicillin-pot clavulanate DOSE 1 - oral 875 mg-125 mg tablet - 1 tablet oral twice a day x 14 days [name] ([id number]) [phone].pdf Page 2 of 2 Electronic Signature([name]) Signed: 08/11/2023 12:09:00 PM By: Angelina Pih Signed: 08/11/2023 1:31:37 PM By: Allen Derry PA-C Previous Signature: 08/11/2023 12:08:43 PM Version By: Angelina Pih Previous Signature: 08/10/2023 5:45:09 PM Version By: Allen Derry PA-C Previous Signature: 08/10/2023 4:14:52 PM Version By: Angelina Pih Entered By: Angelina Pih on 08/11/2023 09:09:00 -------------------------------------------------------------------------------- SuperBill Details Patient Name: Date of Service: [name] NA LD-BO [name]. 08/10/2023 Medical Record Number: [id number] Patient Account Number: [id number] Date of Birth/Sex: Treating RN: 08-27-1976 (46 y.o. 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

## 2023-08-11 NOTE — Patient Instructions (Addendum)
Your procedure is scheduled on:08-14-23 Monday Report to the Registration Desk on the 1st floor of the [facility].Then proceed to the 2nd floor Surgery Desk Arrive at 9:30 am.  REMEMBER: Instructions that are not followed completely may result in serious medical risk, up to and including death; or upon the discretion of your surgeon and anesthesiologist your surgery may need to be rescheduled.  Do not eat food after midnight the night before surgery.  No gum chewing or hard candies.  You may however, drink Water up to 2 hours before you are scheduled to arrive for your surgery. Do not drink anything within 2 hours of your scheduled arrival time.  One week prior to surgery Stop ANY OVER THE COUNTER supplements until after surgery (Multivitamin)  Stop empagliflozin (JARDIANCE) 3 days prior to surgery-Last dose was on 08-11-23 Friday  Stop metFORMIN (GLUCOPHAGE) 2 days prior to surgery-Last dose will be on 08-11-23 Friday  Stop Semaglutide (RYBELSUS) 1 day prior to surgery-Last dose will be on 08-12-23 Saturday  Continue taking all of your other prescription medications up until the day of surgery.  ON THE DAY OF SURGERY ONLY TAKE THESE MEDICATIONS WITH SIPS OF WATER: -buPROPion (WELLBUTRIN SR)  -busPIRone (BUSPAR)  -cyclobenzaprine (FLEXERIL)  -cetirizine (ZYRTEC)  -diazepam (VALIUM)  -gabapentin (NEURONTIN)  -pantoprazole (PROTONIX)   Use your Trelegy Ellipta and Albuterol Inhaler the day of surgery and bring your Albuterol Inhaler to the hospital  No Alcohol for 24 hours before or after surgery.  No Smoking including e-cigarettes for 24 hours before surgery.  No chewable tobacco products for at least 6 hours before surgery.  No nicotine patches on the day of surgery.  Do not use any "recreational" drugs for at least a week (preferably 2 weeks) before your surgery.  Please be advised that the combination of cocaine and anesthesia may have negative outcomes, up to and  including death. If you test positive for cocaine, your surgery will be cancelled.  On the morning of surgery brush your teeth with toothpaste and water, you may rinse your mouth with mouthwash if you wish. Do not swallow any toothpaste or mouthwash.  Use CHG Soap as directed on instruction sheet.  Do not wear jewelry, make-up, hairpins, clips or nail polish.  For welded (permanent) jewelry: bracelets, anklets, waist bands, etc.  Please have this removed prior to surgery.  If it is not removed, there is a chance that hospital personnel will need to cut it off on the day of surgery.  Do not wear lotions, powders, or perfumes.   Do not shave body hair from the neck down 48 hours before surgery.  Contact lenses, hearing aids and dentures may not be worn into surgery.  Do not bring valuables to the hospital. [facility] is not responsible for any missing/lost belongings or valuables.   Notify your doctor if there is any change in your medical condition (cold, fever, infection).  Wear comfortable clothing (specific to your surgery type) to the hospital.  After surgery, you can help prevent lung complications by doing breathing exercises.  Take deep breaths and cough every 1-2 hours. Your doctor may order a device called an Incentive Spirometer to help you take deep breaths. When coughing or sneezing, hold a pillow firmly against your incision with both hands. This is called "splinting." Doing this helps protect your incision. It also decreases belly discomfort.  If you are being admitted to the hospital overnight, leave your suitcase in the car. After surgery it may be  brought to your room.  In case of increased patient census, it may be necessary for you, the patient, to continue your postoperative care in the Same Day Surgery department.  If you are being discharged the day of surgery, you will not be allowed to drive home. You will need a responsible individual to drive you home and  stay with you for 24 hours after surgery.   If you are taking public transportation, you will need to have a responsible individual with you.  Please call the Pre-admissions Testing Dept. at [phone] if you have any questions about these instructions.  Surgery Visitation Policy:  Patients having surgery or a procedure may have two visitors.  Children under the age of 68 must have an adult with them who is not the patient.     Preparing for Surgery with CHLORHEXIDINE GLUCONATE (CHG) Soap  Chlorhexidine Gluconate (CHG) Soap  o An antiseptic cleaner that kills germs and bonds with the skin to continue killing germs even after washing  o Used for showering the night before surgery and morning of surgery  Before surgery, you can play an important role by reducing the number of germs on your skin.  CHG (Chlorhexidine gluconate) soap is an antiseptic cleanser which kills germs and bonds with the skin to continue killing germs even after washing.  Please do not use if you have an allergy to CHG or antibacterial soaps. If your skin becomes reddened/irritated stop using the CHG.  1. Shower the NIGHT BEFORE SURGERY and the MORNING OF SURGERY with CHG soap.  2. If you choose to wash your hair, wash your hair first as usual with your normal shampoo.  3. After shampooing, rinse your hair and body thoroughly to remove the shampoo.  4. Use CHG as you would any other liquid soap. You can apply CHG directly to the skin and wash gently with a scrungie or a clean washcloth.  5. Apply the CHG soap to your body only from the neck down. Do not use on open wounds or open sores. Avoid contact with your eyes, ears, mouth, and genitals (private parts). Wash face and genitals (private parts) with your normal soap.  6. Wash thoroughly, paying special attention to the area where your surgery will be performed.  7. Thoroughly rinse your body with warm water.  8. Do not shower/wash with your normal  soap after using and rinsing off the CHG soap.  9. Pat yourself dry with a clean towel.  10. Wear clean pajamas to bed the night before surgery.  12. Place clean sheets on your bed the night of your first shower and do not sleep with pets.  13. Shower again with the CHG soap on the day of surgery prior to arriving at the hospital.  14. Do not apply any deodorants/lotions/powders.  15. Please wear clean clothes to the hospital.

## 2023-08-11 NOTE — Telephone Encounter (Signed)
Mychart message sent to see if her psychiatrist was OK with her starting wellbutrin

## 2023-08-11 NOTE — Telephone Encounter (Addendum)
I spoke with the patient about the information below. She already took her medications today, but will hold the 3 diabetes medications until surgery. I also gave her CHG soap and instructions.   Planned surgery: complex repair of wound dehiscence    Surgery date: 08/14/23 at [facility]: [address] [place], [place] [address]) - you will find out your arrival time the business day before your surgery.   Pre-op appointment at [facility] Pre-admit Testing: we will call you with a date/time for this. If you are scheduled for an in person appointment, Pre-admit Testing is located on the first floor of the [facility], [address] [facility], [address]. Please bring all prescriptions in the original prescription bottles to your appointment. During this appointment, they will advise you which medications you can take the morning of surgery, and which medications you will need to hold for surgery. Labs (such as blood work, EKG) may be done at your pre-op appointment. You are not required to fast for these labs. Should you need to change your pre-op appointment, please call Pre-admit testing at [phone].    Diabetes/weight loss medications: Empagliflozin (Jardiance: hold for 3 days prior to surgery Semaglutide (Rybelsus) oral: hold for 3 days prior to surgery Metformin: hold for 2 days prior to surgery    How to contact us:  If you have any questions/concerns before or after surgery, you can reach [place] at [phone], or you can send a mychart message. We can be reached by phone or mychart 8am-4pm, Monday-Friday.  *Please note: Calls after 4pm are forwarded to a third party answering service. Mychart messages are not routinely monitored during evenings, weekends, and holidays. Please call our office to contact the answering service for urgent concerns during non-business hours.    If you have FMLA/disability paperwork, please drop it off  or fax it to [phone], attention Patty.   Appointments/FMLA & disability paperwork: Joycelyn Rua, & Flonnie Hailstone Registered Nurse/Surgery scheduler: Royston Cowper Medical Assistants: Nash Mantis Physician Assistants: Manning Charity, PA-C & Drake Leach, PA-C Surgeons: Venetia Night, MD & Ernestine Mcmurray, MD

## 2023-08-11 NOTE — Progress Notes (Signed)
KYNDELL, PARAISO S ([id number]) [id number] Page 1 of 2 Visit Report for 08/11/2023 Physician Orders Details Patient Name: Date of Service: [name] [name], Devan S. 08/11/2023 11:45 A M Medical Record Number: [id number] Patient Account Number: [id number] Date of Birth/Sex: Treating RN: 11-May-1976 (47 y.o. Freddy Finner Primary Care Provider: Olevia Perches Other Clinician: Referring Provider: Treating Provider/Extender: Marena Chancy Weeks in Treatment: 1 Verbal / Phone Orders: No Diagnosis Coding Follow-up Appointments Return Appointment in 1 week. Nurse Visit as needed - PRN Bathing/ Shower/ Hygiene Clean wound with Normal Saline or wound cleanser. Anesthetic (Use 'Patient Medications' Section for Anesthetic Order Entry) Lidocaine applied to wound bed Wound Treatment Wound #1 - Back Wound Laterality: Midline Cleanser: Byram Ancillary Kit - 15 Day Supply (Generic) 1 x Per Day/30 Days Discharge Instructions: Use supplies as instructed; Kit contains: (15) Saline Bullets; (15) 3x3 Gauze; 15 pr Gloves Cleanser: Normal Saline 1 x Per Day/30 Days Discharge Instructions: Wash your hands with soap and water. Remove old dressing, discard into plastic bag and place into trash. Cleanse the wound with Normal Saline prior to applying a clean dressing using gauze sponges, not tissues or cotton balls. Do not scrub or use excessive force. Pat dry using gauze sponges, not tissue or cotton balls. Cleanser: Vashe 5.8 (oz) 1 x Per Day/30 Days Discharge Instructions: Use vashe 5.8 (oz) as directed Topical: Santyl Collagenase Ointment, 30 (gm), tube 1 x Per Day/30 Days Discharge Instructions: apply nickel thick to wound bed only Prim Dressing: Hydrofera Blue Ready Transfer Foam, 4x5 (in/in) (Generic) 1 x Per Day/30 Days ary Discharge Instructions: Apply Hydrofera Blue Ready to wound bed as directed Prim Dressing: Hydrofera Blue Classic Foam Rope  Dressing, 9x6 (mm/in) 1 x Per Day/30 Days ary Discharge Instructions: cut in half, full length inserted at downward angle Secondary Dressing: ABD Pad 5x9 (in/in) (Generic) 1 x Per Day/30 Days Discharge Instructions: Cover with ABD pad Secured With: Medipore T - 41M Medipore H Soft Cloth Surgical T ape ape, 2x2 (in/yd) (Generic) 1 x Per Day/30 Days Electronic Signature(s) Signed: 08/11/2023 12:31:51 PM By: Yevonne Pax RN Signed: 08/11/2023 1:31:37 PM By: Allen Derry PA-C Entered By: Yevonne Pax on 08/11/2023 09:31:51 [name], Shonika S ([id number]) [id number] Page 2 of 2 -------------------------------------------------------------------------------- SuperBill Details Patient Name: Date of Service: [name] [name], Alexus S. 08/11/2023 Medical Record Number: [id number] Patient Account Number: [id number] Date of Birth/Sex: Treating RN: Mar 10, 1976 (47 y.o. 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

## 2023-08-11 NOTE — Telephone Encounter (Signed)
Patient  called the office and stated that she is having surgery on Monday she states that the surgeon wants her to stop smoking asap. Patient says the she has talked to provider about stopping smoking before and would like provider to send in an RX to help. Please advise

## 2023-08-14 ENCOUNTER — Ambulatory Visit: Payer: BC Managed Care – PPO

## 2023-08-14 ENCOUNTER — Encounter
Admission: RE | Disposition: A | Discharge status: Home / Self Care | Payer: Self-pay | Source: Home / Self Care | Attending: Neurosurgery

## 2023-08-14 ENCOUNTER — Other Ambulatory Visit: Payer: Self-pay

## 2023-08-14 ENCOUNTER — Ambulatory Visit: Payer: BC Managed Care – PPO | Admitting: Certified Registered"

## 2023-08-14 ENCOUNTER — Ambulatory Visit
Admission: RE | Admit: 2023-08-14 | Discharge: 2023-08-14 | Disposition: A | Discharge status: Home / Self Care | Payer: BC Managed Care – PPO | Attending: Neurosurgery | Admitting: Neurosurgery

## 2023-08-14 ENCOUNTER — Other Ambulatory Visit: Payer: Self-pay | Admitting: Neurosurgery

## 2023-08-14 ENCOUNTER — Telehealth: Payer: Self-pay

## 2023-08-14 DIAGNOSIS — E1122 Type 2 diabetes mellitus with diabetic chronic kidney disease: Secondary | ICD-10-CM | POA: Diagnosis not present

## 2023-08-14 DIAGNOSIS — L98428 Non-pressure chronic ulcer of back with other specified severity: Secondary | ICD-10-CM | POA: Diagnosis present

## 2023-08-14 DIAGNOSIS — J45909 Unspecified asthma, uncomplicated: Secondary | ICD-10-CM | POA: Insufficient documentation

## 2023-08-14 DIAGNOSIS — I2510 Atherosclerotic heart disease of native coronary artery without angina pectoris: Secondary | ICD-10-CM | POA: Insufficient documentation

## 2023-08-14 DIAGNOSIS — N181 Chronic kidney disease, stage 1: Secondary | ICD-10-CM | POA: Insufficient documentation

## 2023-08-14 DIAGNOSIS — I129 Hypertensive chronic kidney disease with stage 1 through stage 4 chronic kidney disease, or unspecified chronic kidney disease: Secondary | ICD-10-CM | POA: Insufficient documentation

## 2023-08-14 DIAGNOSIS — Z01818 Encounter for other preprocedural examination: Secondary | ICD-10-CM

## 2023-08-14 DIAGNOSIS — D509 Iron deficiency anemia, unspecified: Secondary | ICD-10-CM | POA: Insufficient documentation

## 2023-08-14 DIAGNOSIS — D334 Benign neoplasm of spinal cord: Secondary | ICD-10-CM | POA: Diagnosis not present

## 2023-08-14 DIAGNOSIS — X58XXXA Exposure to other specified factors, initial encounter: Secondary | ICD-10-CM | POA: Insufficient documentation

## 2023-08-14 DIAGNOSIS — F319 Bipolar disorder, unspecified: Secondary | ICD-10-CM | POA: Insufficient documentation

## 2023-08-14 DIAGNOSIS — E11622 Type 2 diabetes mellitus with other skin ulcer: Secondary | ICD-10-CM | POA: Diagnosis not present

## 2023-08-14 DIAGNOSIS — Z79899 Other long term (current) drug therapy: Secondary | ICD-10-CM | POA: Insufficient documentation

## 2023-08-14 DIAGNOSIS — T8131XA Disruption of external operation (surgical) wound, not elsewhere classified, initial encounter: Secondary | ICD-10-CM

## 2023-08-14 DIAGNOSIS — T8131XD Disruption of external operation (surgical) wound, not elsewhere classified, subsequent encounter: Secondary | ICD-10-CM | POA: Diagnosis not present

## 2023-08-14 DIAGNOSIS — F17210 Nicotine dependence, cigarettes, uncomplicated: Secondary | ICD-10-CM | POA: Diagnosis not present

## 2023-08-14 DIAGNOSIS — I252 Old myocardial infarction: Secondary | ICD-10-CM | POA: Insufficient documentation

## 2023-08-14 HISTORY — PX: LUMBAR WOUND DEBRIDEMENT: SHX1988

## 2023-08-14 LAB — GLUCOSE, CAPILLARY
Glucose-Capillary: 162 mg/dL — ABNORMAL HIGH (ref 70–99)
Glucose-Capillary: 169 mg/dL — ABNORMAL HIGH (ref 70–99)

## 2023-08-14 SURGERY — LUMBAR WOUND DEBRIDEMENT
Anesthesia: General

## 2023-08-14 MED ORDER — LIDOCAINE HCL (PF) 2 % IJ SOLN
INTRAMUSCULAR | Status: AC
  Filled 2023-08-14: qty 5

## 2023-08-14 MED ORDER — OXYCODONE HCL 5 MG PO TABS
ORAL_TABLET | ORAL | Status: AC
  Filled 2023-08-14: qty 1

## 2023-08-14 MED ORDER — SUCCINYLCHOLINE CHLORIDE 200 MG/10ML IV SOSY
PREFILLED_SYRINGE | INTRAVENOUS | Status: AC
  Filled 2023-08-14: qty 10

## 2023-08-14 MED ORDER — DEXAMETHASONE SODIUM PHOSPHATE 10 MG/ML IJ SOLN
INTRAMUSCULAR | Status: AC
  Filled 2023-08-14: qty 1

## 2023-08-14 MED ORDER — ONDANSETRON HCL 4 MG/2ML IJ SOLN
INTRAMUSCULAR | Status: DC | PRN
  Administered 2023-08-14: 4 mg via INTRAVENOUS

## 2023-08-14 MED ORDER — DROPERIDOL 2.5 MG/ML IJ SOLN: 0.6250 mg | Freq: Once | INTRAMUSCULAR | Status: DC | PRN

## 2023-08-14 MED ORDER — SUGAMMADEX SODIUM 200 MG/2ML IV SOLN
INTRAVENOUS | Status: DC | PRN
  Administered 2023-08-14: 200 mg via INTRAVENOUS

## 2023-08-14 MED ORDER — FENTANYL CITRATE (PF) 100 MCG/2ML IJ SOLN
INTRAMUSCULAR | Status: AC
  Filled 2023-08-14: qty 2

## 2023-08-14 MED ORDER — ACETAMINOPHEN 10 MG/ML IV SOLN
INTRAVENOUS | Status: DC | PRN
  Administered 2023-08-14: 1000 mg via INTRAVENOUS

## 2023-08-14 MED ORDER — EPINEPHRINE PF 1 MG/ML IJ SOLN
INTRAMUSCULAR | Status: AC
  Filled 2023-08-14: qty 1

## 2023-08-14 MED ORDER — HYDROMORPHONE HCL 1 MG/ML IJ SOLN
INTRAMUSCULAR | Status: DC | PRN
Start: 2023-08-14 — End: 2023-08-14
  Administered 2023-08-14 (×2): .5 mg via INTRAVENOUS

## 2023-08-14 MED ORDER — KETAMINE HCL 50 MG/5ML IJ SOSY
PREFILLED_SYRINGE | INTRAMUSCULAR | Status: AC
Start: 2023-08-14 — End: ?
  Filled 2023-08-14: qty 5

## 2023-08-14 MED ORDER — BUPIVACAINE HCL (PF) 0.5 % IJ SOLN
INTRAMUSCULAR | Status: AC
  Filled 2023-08-14: qty 30

## 2023-08-14 MED ORDER — ORAL CARE MOUTH RINSE: 15.0000 mL | Freq: Once | OROMUCOSAL | Status: AC

## 2023-08-14 MED ORDER — SUCCINYLCHOLINE CHLORIDE 200 MG/10ML IV SOSY
PREFILLED_SYRINGE | INTRAVENOUS | Status: DC | PRN
  Administered 2023-08-14: 120 mg via INTRAVENOUS

## 2023-08-14 MED ORDER — MIDAZOLAM HCL 2 MG/2ML IJ SOLN
INTRAMUSCULAR | Status: AC
Start: 2023-08-14 — End: ?
  Filled 2023-08-14: qty 2

## 2023-08-14 MED ORDER — OXYCODONE HCL 5 MG PO CAPS: 5.0000 mg | ORAL_CAPSULE | ORAL | 0 refills | Status: DC | PRN

## 2023-08-14 MED ORDER — PROPOFOL 10 MG/ML IV BOLUS
INTRAVENOUS | Status: DC | PRN
  Administered 2023-08-14: 120 mg via INTRAVENOUS

## 2023-08-14 MED ORDER — ACETAMINOPHEN 10 MG/ML IV SOLN
INTRAVENOUS | Status: AC
  Filled 2023-08-14: qty 100

## 2023-08-14 MED ORDER — GLYCOPYRROLATE 0.2 MG/ML IJ SOLN
INTRAMUSCULAR | Status: AC
  Filled 2023-08-14: qty 1

## 2023-08-14 MED ORDER — ROCURONIUM BROMIDE 100 MG/10ML IV SOLN
INTRAVENOUS | Status: DC | PRN
  Administered 2023-08-14: 40 mg via INTRAVENOUS

## 2023-08-14 MED ORDER — SODIUM CHLORIDE 0.9 % IV SOLN: INTRAVENOUS | Status: DC

## 2023-08-14 MED ORDER — VANCOMYCIN HCL 1000 MG IV SOLR
INTRAVENOUS | Status: AC
Start: 2023-08-14 — End: ?
  Filled 2023-08-14: qty 20

## 2023-08-14 MED ORDER — FENTANYL CITRATE (PF) 100 MCG/2ML IJ SOLN
INTRAMUSCULAR | Status: DC | PRN
  Administered 2023-08-14 (×2): 50 ug via INTRAVENOUS

## 2023-08-14 MED ORDER — HYDROMORPHONE HCL 1 MG/ML IJ SOLN
INTRAMUSCULAR | Status: AC
  Filled 2023-08-14: qty 1

## 2023-08-14 MED ORDER — ROCURONIUM BROMIDE 10 MG/ML (PF) SYRINGE
PREFILLED_SYRINGE | INTRAVENOUS | Status: AC
  Filled 2023-08-14: qty 10

## 2023-08-14 MED ORDER — LIDOCAINE HCL (CARDIAC) PF 100 MG/5ML IV SOSY
PREFILLED_SYRINGE | INTRAVENOUS | Status: DC | PRN
  Administered 2023-08-14: 100 mg via INTRAVENOUS

## 2023-08-14 MED ORDER — MIDAZOLAM HCL 2 MG/2ML IJ SOLN
INTRAMUSCULAR | Status: DC | PRN
  Administered 2023-08-14: 2 mg via INTRAVENOUS

## 2023-08-14 MED ORDER — FENTANYL CITRATE (PF) 100 MCG/2ML IJ SOLN
25.0000 ug | INTRAMUSCULAR | Status: DC | PRN
  Administered 2023-08-14 (×2): 25 ug via INTRAVENOUS

## 2023-08-14 MED ORDER — ACETAMINOPHEN 10 MG/ML IV SOLN: 1000.0000 mg | Freq: Once | INTRAVENOUS | Status: DC | PRN

## 2023-08-14 MED ORDER — DEXMEDETOMIDINE HCL IN NACL 80 MCG/20ML IV SOLN
INTRAVENOUS | Status: DC | PRN
  Administered 2023-08-14 (×2): 4 ug via INTRAVENOUS
  Administered 2023-08-14: 8 ug via INTRAVENOUS

## 2023-08-14 MED ORDER — ONDANSETRON HCL 4 MG/2ML IJ SOLN
INTRAMUSCULAR | Status: AC
  Filled 2023-08-14: qty 2

## 2023-08-14 MED ORDER — GLYCOPYRROLATE 0.2 MG/ML IJ SOLN
INTRAMUSCULAR | Status: DC | PRN
  Administered 2023-08-14: .1 mg via INTRAVENOUS

## 2023-08-14 MED ORDER — OXYCODONE HCL 5 MG/5ML PO SOLN: 5.0000 mg | Freq: Once | ORAL | Status: AC | PRN

## 2023-08-14 MED ORDER — CEFAZOLIN SODIUM-DEXTROSE 2-4 GM/100ML-% IV SOLN
INTRAVENOUS | Status: AC
  Filled 2023-08-14: qty 100

## 2023-08-14 MED ORDER — CHLORHEXIDINE GLUCONATE 0.12 % MT SOLN
OROMUCOSAL | Status: AC
  Filled 2023-08-14: qty 15

## 2023-08-14 MED ORDER — VANCOMYCIN HCL 1000 MG IV SOLR
INTRAVENOUS | Status: DC | PRN
  Administered 2023-08-14: 1000 mg via TOPICAL

## 2023-08-14 MED ORDER — PROPOFOL 10 MG/ML IV BOLUS
INTRAVENOUS | Status: AC
  Filled 2023-08-14: qty 20

## 2023-08-14 MED ORDER — 0.9 % SODIUM CHLORIDE (POUR BTL) OPTIME
TOPICAL | Status: DC | PRN
  Administered 2023-08-14: 1000 mL

## 2023-08-14 MED ORDER — OXYCODONE HCL 5 MG PO TABS: 5.0000 mg | ORAL_TABLET | ORAL | 0 refills | Status: AC | PRN

## 2023-08-14 MED ORDER — DEXMEDETOMIDINE HCL IN NACL 80 MCG/20ML IV SOLN
INTRAVENOUS | Status: AC
  Filled 2023-08-14: qty 20

## 2023-08-14 MED ORDER — DEXAMETHASONE SODIUM PHOSPHATE 10 MG/ML IJ SOLN
INTRAMUSCULAR | Status: DC | PRN
  Administered 2023-08-14: 10 mg via INTRAVENOUS

## 2023-08-14 MED ORDER — CHLORHEXIDINE GLUCONATE 0.12 % MT SOLN
15.0000 mL | Freq: Once | OROMUCOSAL | Status: AC
Start: 2023-08-14 — End: 2023-08-14
  Administered 2023-08-14: 15 mL via OROMUCOSAL

## 2023-08-14 MED ORDER — CEFAZOLIN IN SODIUM CHLORIDE 2-0.9 GM/100ML-% IV SOLN
2.0000 g | Freq: Once | INTRAVENOUS | Status: AC
  Administered 2023-08-14: 2 g via INTRAVENOUS
  Filled 2023-08-14: qty 100

## 2023-08-14 MED ORDER — OXYCODONE HCL 5 MG PO TABS
5.0000 mg | ORAL_TABLET | Freq: Once | ORAL | Status: AC | PRN
  Administered 2023-08-14: 5 mg via ORAL

## 2023-08-14 MED ORDER — KETAMINE HCL 50 MG/5ML IJ SOSY
PREFILLED_SYRINGE | INTRAMUSCULAR | Status: DC | PRN
  Administered 2023-08-14: 10 mg via INTRAVENOUS
  Administered 2023-08-14: 20 mg via INTRAVENOUS

## 2023-08-14 SURGICAL SUPPLY — 54 items
ADH SKN CLS APL DERMABOND .7 (GAUZE/BANDAGES/DRESSINGS)
APL PRP STRL LF DISP 70% ISPRP (MISCELLANEOUS)
BASIN KIT SINGLE STR (MISCELLANEOUS) ×1 IMPLANT
CHLORAPREP W/TINT 26 (MISCELLANEOUS) ×1 IMPLANT
CNTNR URN SCR LID CUP LEK RST (MISCELLANEOUS) ×1 IMPLANT
CONT SPEC 4OZ STRL OR WHT (MISCELLANEOUS) ×1
DERMABOND ADVANCED .7 DNX12 (GAUZE/BANDAGES/DRESSINGS) ×1 IMPLANT
DRAPE C ARM PK CFD 31 SPINE (DRAPES) ×1 IMPLANT
DRAPE LAPAROTOMY 100X77 ABD (DRAPES) ×1 IMPLANT
DRAPE SURG 17X11 SM STRL (DRAPES) ×1 IMPLANT
DRSG OPSITE 4X5.5 SM (GAUZE/BANDAGES/DRESSINGS) IMPLANT
DRSG OPSITE POSTOP 4X6 (GAUZE/BANDAGES/DRESSINGS) IMPLANT
DRSG TEGADERM 4X4.75 (GAUZE/BANDAGES/DRESSINGS) ×1 IMPLANT
DRSG TEGADERM 6X8 (GAUZE/BANDAGES/DRESSINGS) IMPLANT
ELECT REM PT RETURN 9FT ADLT (ELECTROSURGICAL) ×1
ELECTRODE REM PT RTRN 9FT ADLT (ELECTROSURGICAL) ×1 IMPLANT
EVACUATOR 1/8 PVC DRAIN (DRAIN) ×1 IMPLANT
GAUZE 4X4 16PLY ~~LOC~~+RFID DBL (SPONGE) IMPLANT
GAUZE SPONGE 2X2 STRL 8-PLY (GAUZE/BANDAGES/DRESSINGS) IMPLANT
GAUZE SPONGE 4X4 12PLY STRL (GAUZE/BANDAGES/DRESSINGS) IMPLANT
GLOVE BIOGEL PI IND STRL 6.5 (GLOVE) ×1 IMPLANT
GLOVE BIOGEL PI IND STRL 8.5 (GLOVE) ×1 IMPLANT
GLOVE SURG SYN 6.5 ES PF (GLOVE) ×1 IMPLANT
GLOVE SURG SYN 6.5 PF PI (GLOVE) ×1 IMPLANT
GLOVE SURG SYN 8.5 E (GLOVE) ×3 IMPLANT
GLOVE SURG SYN 8.5 PF PI (GLOVE) ×3 IMPLANT
GOWN SRG LRG LVL 4 IMPRV REINF (GOWNS) ×1 IMPLANT
GOWN SRG XL LVL 3 NONREINFORCE (GOWNS) ×1 IMPLANT
GOWN STRL NON-REIN TWL XL LVL3 (GOWNS) ×1
GOWN STRL REIN LRG LVL4 (GOWNS) ×1
IV NS IRRIG 3000ML ARTHROMATIC (IV SOLUTION) ×1 IMPLANT
JET LAVAGE IRRISEPT WOUND (IRRIGATION / IRRIGATOR)
KIT PREVENA INCISION MGT20CM45 (CANNISTER) IMPLANT
KIT SPINAL PRONEVIEW (KITS) ×1 IMPLANT
LAVAGE JET IRRISEPT WOUND (IRRIGATION / IRRIGATOR) ×1 IMPLANT
MANIFOLD NEPTUNE II (INSTRUMENTS) ×1 IMPLANT
MARKER SKIN DUAL TIP RULER LAB (MISCELLANEOUS) ×1 IMPLANT
NDL SAFETY ECLIPSE 18X1.5 (NEEDLE) ×1 IMPLANT
NS IRRIG 1000ML POUR BTL (IV SOLUTION) ×1 IMPLANT
PACK LAMINECTOMY ARMC (PACKS) ×1 IMPLANT
PAD ARMBOARD 7.5X6 YLW CONV (MISCELLANEOUS) ×1 IMPLANT
PULSAVAC PLUS IRRIG FAN TIP (DISPOSABLE)
SOL PREP PVP 2OZ (MISCELLANEOUS) ×2
SOLUTION PREP PVP 2OZ (MISCELLANEOUS) IMPLANT
STAPLER SKIN PROX 35W (STAPLE) ×1 IMPLANT
SUT NYLON 3 0 (SUTURE) ×1 IMPLANT
SUT VIC AB 0 CT1 18XCR BRD 8 (SUTURE) IMPLANT
SUT VIC AB 0 CT1 8-18 (SUTURE) ×2
SUT VIC AB 2-0 CT1 18 (SUTURE) ×1 IMPLANT
SWAB CULTURE AMIES ANAERIB BLU (MISCELLANEOUS) IMPLANT
SYR 3ML LL SCALE MARK (SYRINGE) ×1 IMPLANT
TIP FAN IRRIG PULSAVAC PLUS (DISPOSABLE) IMPLANT
TRAP FLUID SMOKE EVACUATOR (MISCELLANEOUS) ×1 IMPLANT
WATER STERILE IRR 1000ML POUR (IV SOLUTION) ×2 IMPLANT
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
## 2023-08-14 NOTE — Anesthesia Postprocedure Evaluation (Deleted)
Anesthesia Post Note  Patient: [name]  Procedure(s) Performed: COMPLEX REPAIR OF WOUND DEHISCENCE  Patient location during evaluation: PACU Anesthesia Type: General Level of consciousness: awake and alert Pain management: pain level controlled Vital Signs Assessment: post-procedure vital signs reviewed and stable Respiratory status: spontaneous breathing, nonlabored ventilation, respiratory function stable and patient connected to nasal cannula oxygen Cardiovascular status: blood pressure returned to baseline and stable Postop Assessment: no apparent nausea or vomiting Anesthetic complications: no   No notable events documented.   Last Vitals:  Vitals:   08/14/23 1017  BP: 121/80  Pulse: 80  Resp: 18  Temp: (!) 36.1 C  SpO2: 98%    Last Pain:  Vitals:   08/14/23 1017  TempSrc: Oral  PainSc: 5                  [name]

## 2023-08-14 NOTE — Discharge Instructions (Addendum)
NEUROSURGERY DISCHARGE INSTRUCTIONS  Admission diagnosis: wound dehiscence  Operative procedure: wound revision  What to do after you leave the hospital:  Recommended diet: regular diet. Increase protein intake to promote wound healing.  Recommended activity: no lifting, driving, or strenuous exercise for 4 weeks . You should walk multiple times per day  Continue antibiotics as prescribed by wound care clinic  Wound vac to remain in place until follow up. Please empty drain twice daily and record outputs.  Special Instructions  No straining, no heavy lifting > 10lbs x 4 weeks.  Keep incision area clean and dry. May shower in 2 days. No baths or pools for 6 weeks.    You have sutures or staples that will be removed in clinic.   Please take pain medications as directed. Take a stool softener if on pain medications   Please Report any of the following: Nausea or Vomiting, Temperature is greater than 101.62F (38.1C) degrees, Dizziness, Abdominal Pain, Difficulty Breathing or Shortness of Breath, Inability to Eat, drink Fluids, or Take medications, Bleeding, swelling, or drainage from surgical incision sites, New numbness or weakness, and Bowel or bladder dysfunction to the neurosurgeon on call. How to contact us:  If you have any questions/concerns before or after surgery, you can reach [place] at [phone], or you can send a mychart message. We can be reached by phone or mychart 8am-4pm, Monday-Friday.  *Please note: Calls after 4pm are forwarded to a third party answering service. Mychart messages are not routinely monitored during evenings, weekends, and holidays. Please call our office to contact the answering service for urgent concerns during non-business hours.   Additional Follow up appointments Please follow up with Drake Leach PA-C as scheduled in 2-3 weeks   Please see below for scheduled appointments:  Future Appointments  Date Time Provider Department Center  08/15/2023  11:30 AM [facility] NURSE [facility] None  08/16/2023 12:30 PM Maxwell Caul, MD [facility] None  08/21/2023  3:30 PM Drake Leach, PA-C [facility] None  09/11/2023  9:00 AM Dorcas Carrow, DO [facility]  09/19/2023 10:00 AM Susanne Borders, PA [facility] None  09/22/2023  4:00 PM Jake Bathe, MD [facility]   AMBULATORY SURGERY  DISCHARGE INSTRUCTIONS   The drugs that you were given will stay in your system until tomorrow so for the next 24 hours you should not:  Drive an automobile Make any legal decisions Drink any alcoholic beverage   You may resume regular meals tomorrow.  Today it is better to start with liquids and gradually work up to solid foods.  You may eat anything you prefer, but it is better to start with liquids, then soup and crackers, and gradually work up to solid foods.   Please notify your doctor immediately if you have any unusual bleeding, trouble breathing, redness and pain at the surgery site, drainage, fever, or pain not relieved by medication.    Additional Instructions:        Please contact your physician with any problems or Same Day Surgery at [phone], Monday through Friday 6 am to 4 pm, or [place] at [facility] number at [phone].

## 2023-08-14 NOTE — Telephone Encounter (Signed)
Received authorization request for oxycodone. Baldemar Friday, CMA submitted expedited authorization request through Covermymeds for oxycodone around 3pm. I logged on to covermymeds at 6pm to check the status and the request could not be located. I called [facility] to check to see if the patient picked up the rx.  Per [facility], she did not pick up the rx. However, the rx was sent for capsules and they do not have capsules. They cannot take a verbal to switch to tablets, a new rx for tablets will need to be sent. I advised them to cancel the rx for capsules. I sent a secure chat to Manning Charity, PA-C to send a new rx for tablets.

## 2023-08-14 NOTE — Anesthesia Procedure Notes (Signed)
Procedure Name: Intubation Date/Time: 08/14/2023 1:01 PM  Performed by: Morene Crocker, CRNAPre-anesthesia Checklist: Patient identified, Patient being monitored, Timeout performed, Emergency Drugs available and Suction available Patient Re-evaluated:Patient Re-evaluated prior to induction Oxygen Delivery Method: Circle system utilized Preoxygenation: Pre-oxygenation with 100% oxygen Induction Type: IV induction Ventilation: Mask ventilation without difficulty Laryngoscope Size: 3 and McGraph Grade View: Grade I Tube type: Oral Tube size: 7.0 mm Number of attempts: 1 Airway Equipment and Method: Stylet Placement Confirmation: ETT inserted through vocal cords under direct vision, positive ETCO2 and breath sounds checked- equal and bilateral Secured at: 22 cm Tube secured with: Tape Dental Injury: Teeth and Oropharynx as per pre-operative assessment  Comments: Smooth atraumatic intubation, no complications noted

## 2023-08-14 NOTE — Interval H&P Note (Signed)
History and Physical Interval Note:  08/14/2023 11:56 AM  [name]  has presented today for surgery, with the diagnosis of wound dehiscence.  The various methods of treatment have been discussed with the patient and family. After consideration of risks, benefits and other options for treatment, the patient has consented to  Procedure(s): COMPLEX REPAIR OF WOUND DEHISCENCE (N/A) as a surgical intervention.  The patient's history has been reviewed, patient examined, no change in status, stable for surgery.  I have reviewed the patient's chart and labs.  Questions were answered to the patient's satisfaction.    Heart sounds normal no MRG. Chest Clear to Auscultation Bilaterally.  Medford Staheli

## 2023-08-14 NOTE — Op Note (Signed)
Indications: [name] is suffering from a wound dehiscence.  This is a planned re-admission to the operating room to address a non-healing wound.  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  In the postoperative period, she developed a simple superficial wound dehiscence.  Unfortunately, this deepened into a significant tract.  She has been referred to wound care, who recommended operative repair.  After reviewing this with her, I advised that she should move forward with operative repair of this wound dehiscence.  Findings: Dehisced wound, no evidence of infection  Preoperative Diagnosis: Wound dehiscence Postoperative Diagnosis: same   EBL: 25 ml IVF: see AR Drains: 1 Disposition: Extubated and Stable to PACU Complications: none  No foley catheter was placed.   Preoperative Note:   Risks of surgery discussed include: infection, bleeding, stroke, coma, death, paralysis, CSF leak, nerve/spinal cord injury, numbness, tingling, weakness, complex regional pain syndrome, recurrent stenosis and/or disc herniation, vascular injury, development of instability, neck/back pain, need for further surgery, persistent symptoms, development of deformity, and the risks of anesthesia. The patient understood these risks and agreed to proceed.  Operative Note:  1. Complex Repair of Wound Dehiscence    The patient was brought to the Operating Room, intubated and turned into the prone position. All pressure points were checked and double checked.  The prior lumbar incision was identified.  The tract was inspected.  The operative site was prepped and draped in standard fashion.  A timeout was performed.  Preoperative antibiotics were given.  The prior incision was opened superiorly and inferiorly to the area of wound dehiscence.  The superficial tissues were divided.  A wound culture was taken.  The paraspinous muscles were  identified.  They were also divided.  No evidence of spinal fluid leakage was noted.  No obvious pus was identified.  The operative site was copiously irrigated with chlorhexidine containing solution.  The skin knife was used to excise the area of skin with superficial and deep dehiscence.  This was debrided until freshly bleeding skin was identified.  The deep tissues were also gently debrided.  Using the scalpel.  The superficial tissues were then advanced to allow for closure.  Vancomycin powder was placed in the wound.  After the tissue advancement, a drain was placed.  The paraspinous muscles were closed with interrupted sutures.  The fascia was closed with interrupted sutures.  2-0 Vicryl was used on the dermis.  Staples were used for final skin closure.  The drain was secured at the skin.  An incisional wound VAC was placed.   The patient was then flipped supine and extubated with incident. All counts were correct times 2 at the end of the case. No immediate complications were noted.  Manning Charity PA assisted in the entire procedure. An assistant was required for this procedure due to the complexity.  The assistant provided assistance in tissue manipulation and suction, and was required for the successful and safe performance of the procedure. I performed the critical portions of the procedure.   Venetia Night MD

## 2023-08-14 NOTE — Progress Notes (Signed)
and Hydrofera Blue. We have made really remarkable progress this week Electronic Signature(s) Signed: 08/10/2023 3:33:32 PM By: Baltazar Najjar MD Entered By: Baltazar Najjar on 08/09/2023 88:41:66 -------------------------------------------------------------------------------- Physical Exam Details Patient Name: Date of Service: [name] [name], Jayel S. 08/09/2023 9:15 A M Medical Record Number: [id number] Patient Account Number: [id number] Date of Birth/Sex: Treating RN: 08-11-1976 (47 y.o. Freddy Finner Primary Care Provider: Olevia Perches Other Clinician: Betha Loa Referring Provider: Treating Provider/Extender: RO BSO N, MICHA EL Ashok Cordia, Megan Weeks in Treatment: 1 Constitutional Sitting or standing Blood Pressure is within target range for patient.. Pulse regular and within target range for patient.Marland Kitchen Respirations regular, non-labored and within target range.. Temperature is normal and within the target range for the patient.Marland Kitchen appears in no distress. Notes Wound exam; surgical wound at the L1-L2 level. Much better looking surface with healthy granulation showing through. Still necrotic debris on almost the [facility] S ([id number]) [id number] Page 3 of 7 entirety of the wound edges. The tunnel near the superior part of the wound also appears better with less depth. What I can see healthy looking granulation here as well Electronic Signature(s) Signed: 08/10/2023 3:33:32 PM By: Baltazar Najjar MD Entered By: Baltazar Najjar on 08/09/2023 07:29:50 -------------------------------------------------------------------------------- Physician Orders  Details Patient Name: Date of Service: [name] [name], Stepahnie S. 08/09/2023 9:15 A M Medical Record Number: [id number] Patient Account Number: [id number] Date of Birth/Sex: Treating RN: 01-02-1976 (47 y.o. Freddy Finner Primary Care Provider: Olevia Perches Other Clinician: Betha Loa Referring Provider: Treating Provider/Extender: RO BSO Dorris Carnes, MICHA EL Ashok Cordia, Megan Weeks in Treatment: 1 The following information was scribed by: Betha Loa The information was scribed for: Maxwell Caul Verbal / Phone Orders: Yes Clinician: Yevonne Pax Read Back and Verified: Yes Diagnosis Coding Follow-up Appointments Return Appointment in 1 week. Bathing/ Shower/ Hygiene Clean wound with Normal Saline or wound cleanser. Anesthetic (Use 'Patient Medications' Section for Anesthetic Order Entry) Lidocaine applied to wound bed Wound Treatment Wound #1 - Back Wound Laterality: Midline Cleanser: Byram Ancillary Kit - 15 Day Supply (Generic) 1 x Per Day/30 Days Discharge Instructions: Use supplies as instructed; Kit contains: (15) Saline Bullets; (15) 3x3 Gauze; 15 pr Gloves Cleanser: Normal Saline 1 x Per Day/30 Days Discharge Instructions: Wash your hands with soap and water. Remove old dressing, discard into plastic bag and place into trash. Cleanse the wound with Normal Saline prior to applying a clean dressing using gauze sponges, not tissues or cotton balls. Do not scrub or use excessive force. Pat dry using gauze sponges, not tissue or cotton balls. Cleanser: Vashe 5.8 (oz) 1 x Per Day/30 Days Discharge Instructions: Use vashe 5.8 (oz) as directed Topical: Santyl Collagenase Ointment, 30 (gm), tube 1 x Per Day/30 Days Discharge Instructions: apply nickel thick to wound bed only Prim Dressing: Hydrofera Blue Ready Transfer Foam, 4x5 (in/in) (Generic) 1 x Per Day/30 Days ary Discharge Instructions: Apply Hydrofera Blue Ready to wound bed as directed Secondary Dressing: ABD  Pad 5x9 (in/in) (Generic) 1 x Per Day/30 Days Discharge Instructions: Cover with ABD pad Secured With: Medipore T - 64M Medipore H Soft Cloth Surgical T ape ape, 2x2 (in/yd) (Generic) 1 x Per Day/30 Days Electronic Signature(s) Signed: 08/11/2023 10:28:30 AM By: Betha Loa Signed: 08/13/2023 11:21:49 AM By: Baltazar Najjar MD Previous Signature: 08/09/2023 5:46:43 PM Version By: Betha Loa Previous Signature: 08/10/2023 3:33:32 PM Version By: Baltazar Najjar MD Entered By: Betha Loa on 08/11/2023 06:56:48 [name], Kourtnei S ([id number]) [id number] Page  4 of 7 -------------------------------------------------------------------------------- Problem List Details Patient Name: Date of Service: [name] [name], Wakisha S. 08/09/2023 9:15 A M Medical Record Number: [id number] Patient Account Number: [id number] Date of Birth/Sex: Treating RN: [birth year] (47 y.o. 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 08/09/2023 9:15 A M Medical Record Number: [id number] Patient Account Number: [id number] Date of Birth/Sex: Treating  RN: Feb 25, 1976 (47 y.o. 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 She was admitted to hospital from 9/9 through 9/12 for surgical resection. She was discharged with a surgical wound that was sutured. The sutures were removed 2 weeks later and then about 3 or 4 days after that the wound dehisced. She was seen in the neurosurgery clinic on 10/3 with an open wound they gave her Keflex empirically which she has now finished and they used Medihoney to the midline wound. The patient has been applying this diligently. She has completed her antibiotics. The area is tender but no systemic symptoms. Past medical history includes bipolar disorder, hypertension, diabetes, coronary artery disease, COPD still smoking, she has recently had syncopal episodes and is wearing a heart monitor. 10/23; surgical wound at the site of a tumor resection. Arrived in clinic last visit with a completely necrotic surface. Extensive debridement. She was found to have a small tunnel right near the superior part of the wound drained a moderate amount of fluid. I cultured this and the results were negative. I am assuming this was a small seroma. The patient is not having any pain and no systemic symptoms. We used Santyl and [employer]	[place]. UNFORTUNATELY the patient's wife was not in town this week and therefore she did not have anybody to change the dressing she has been coming in  for nurse visits with Santyl and Hydrofera Blue. We have made really remarkable progress this week Objective Constitutional Sitting or standing Blood Pressure is within target range for patient.. Pulse regular and within target range for patient.Marland Kitchen Respirations regular, non-labored and within target range.. Temperature is normal and within the target range for the patient.Marland Kitchen appears in no distress. Vitals Time Taken: 9:32 AM, Height: 72 in, Weight: 230.4 lbs, BMI: 31.2, Temperature: 97.9 F, Pulse: 90 bpm, Respiratory Rate: 18 breaths/min, Blood Pressure: 124/90 mmHg. General Notes: Wound exam; surgical wound at the L1-L2 level. Much better looking surface with healthy granulation showing through. Still necrotic debris on almost the entirety of the wound edges. The tunnel near the superior part of the wound also  4 of 7 -------------------------------------------------------------------------------- Problem List Details Patient Name: Date of Service: [name] [name], Wakisha S. 08/09/2023 9:15 A M Medical Record Number: [id number] Patient Account Number: [id number] Date of Birth/Sex: Treating RN: [birth year] (47 y.o. 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 08/09/2023 9:15 A M Medical Record Number: [id number] Patient Account Number: [id number] Date of Birth/Sex: Treating  RN: Feb 25, 1976 (47 y.o. 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 She was admitted to hospital from 9/9 through 9/12 for surgical resection. She was discharged with a surgical wound that was sutured. The sutures were removed 2 weeks later and then about 3 or 4 days after that the wound dehisced. She was seen in the neurosurgery clinic on 10/3 with an open wound they gave her Keflex empirically which she has now finished and they used Medihoney to the midline wound. The patient has been applying this diligently. She has completed her antibiotics. The area is tender but no systemic symptoms. Past medical history includes bipolar disorder, hypertension, diabetes, coronary artery disease, COPD still smoking, she has recently had syncopal episodes and is wearing a heart monitor. 10/23; surgical wound at the site of a tumor resection. Arrived in clinic last visit with a completely necrotic surface. Extensive debridement. She was found to have a small tunnel right near the superior part of the wound drained a moderate amount of fluid. I cultured this and the results were negative. I am assuming this was a small seroma. The patient is not having any pain and no systemic symptoms. We used Santyl and [employer]	[place]. UNFORTUNATELY the patient's wife was not in town this week and therefore she did not have anybody to change the dressing she has been coming in  for nurse visits with Santyl and Hydrofera Blue. We have made really remarkable progress this week Objective Constitutional Sitting or standing Blood Pressure is within target range for patient.. Pulse regular and within target range for patient.Marland Kitchen Respirations regular, non-labored and within target range.. Temperature is normal and within the target range for the patient.Marland Kitchen appears in no distress. Vitals Time Taken: 9:32 AM, Height: 72 in, Weight: 230.4 lbs, BMI: 31.2, Temperature: 97.9 F, Pulse: 90 bpm, Respiratory Rate: 18 breaths/min, Blood Pressure: 124/90 mmHg. General Notes: Wound exam; surgical wound at the L1-L2 level. Much better looking surface with healthy granulation showing through. Still necrotic debris on almost the entirety of the wound edges. The tunnel near the superior part of the wound also  and Hydrofera Blue. We have made really remarkable progress this week Electronic Signature(s) Signed: 08/10/2023 3:33:32 PM By: Baltazar Najjar MD Entered By: Baltazar Najjar on 08/09/2023 88:41:66 -------------------------------------------------------------------------------- Physical Exam Details Patient Name: Date of Service: [name] [name], Jayel S. 08/09/2023 9:15 A M Medical Record Number: [id number] Patient Account Number: [id number] Date of Birth/Sex: Treating RN: 08-11-1976 (47 y.o. Freddy Finner Primary Care Provider: Olevia Perches Other Clinician: Betha Loa Referring Provider: Treating Provider/Extender: RO BSO N, MICHA EL Ashok Cordia, Megan Weeks in Treatment: 1 Constitutional Sitting or standing Blood Pressure is within target range for patient.. Pulse regular and within target range for patient.Marland Kitchen Respirations regular, non-labored and within target range.. Temperature is normal and within the target range for the patient.Marland Kitchen appears in no distress. Notes Wound exam; surgical wound at the L1-L2 level. Much better looking surface with healthy granulation showing through. Still necrotic debris on almost the [facility] S ([id number]) [id number] Page 3 of 7 entirety of the wound edges. The tunnel near the superior part of the wound also appears better with less depth. What I can see healthy looking granulation here as well Electronic Signature(s) Signed: 08/10/2023 3:33:32 PM By: Baltazar Najjar MD Entered By: Baltazar Najjar on 08/09/2023 07:29:50 -------------------------------------------------------------------------------- Physician Orders  Details Patient Name: Date of Service: [name] [name], Stepahnie S. 08/09/2023 9:15 A M Medical Record Number: [id number] Patient Account Number: [id number] Date of Birth/Sex: Treating RN: 01-02-1976 (47 y.o. Freddy Finner Primary Care Provider: Olevia Perches Other Clinician: Betha Loa Referring Provider: Treating Provider/Extender: RO BSO Dorris Carnes, MICHA EL Ashok Cordia, Megan Weeks in Treatment: 1 The following information was scribed by: Betha Loa The information was scribed for: Maxwell Caul Verbal / Phone Orders: Yes Clinician: Yevonne Pax Read Back and Verified: Yes Diagnosis Coding Follow-up Appointments Return Appointment in 1 week. Bathing/ Shower/ Hygiene Clean wound with Normal Saline or wound cleanser. Anesthetic (Use 'Patient Medications' Section for Anesthetic Order Entry) Lidocaine applied to wound bed Wound Treatment Wound #1 - Back Wound Laterality: Midline Cleanser: Byram Ancillary Kit - 15 Day Supply (Generic) 1 x Per Day/30 Days Discharge Instructions: Use supplies as instructed; Kit contains: (15) Saline Bullets; (15) 3x3 Gauze; 15 pr Gloves Cleanser: Normal Saline 1 x Per Day/30 Days Discharge Instructions: Wash your hands with soap and water. Remove old dressing, discard into plastic bag and place into trash. Cleanse the wound with Normal Saline prior to applying a clean dressing using gauze sponges, not tissues or cotton balls. Do not scrub or use excessive force. Pat dry using gauze sponges, not tissue or cotton balls. Cleanser: Vashe 5.8 (oz) 1 x Per Day/30 Days Discharge Instructions: Use vashe 5.8 (oz) as directed Topical: Santyl Collagenase Ointment, 30 (gm), tube 1 x Per Day/30 Days Discharge Instructions: apply nickel thick to wound bed only Prim Dressing: Hydrofera Blue Ready Transfer Foam, 4x5 (in/in) (Generic) 1 x Per Day/30 Days ary Discharge Instructions: Apply Hydrofera Blue Ready to wound bed as directed Secondary Dressing: ABD  Pad 5x9 (in/in) (Generic) 1 x Per Day/30 Days Discharge Instructions: Cover with ABD pad Secured With: Medipore T - 64M Medipore H Soft Cloth Surgical T ape ape, 2x2 (in/yd) (Generic) 1 x Per Day/30 Days Electronic Signature(s) Signed: 08/11/2023 10:28:30 AM By: Betha Loa Signed: 08/13/2023 11:21:49 AM By: Baltazar Najjar MD Previous Signature: 08/09/2023 5:46:43 PM Version By: Betha Loa Previous Signature: 08/10/2023 3:33:32 PM Version By: Baltazar Najjar MD Entered By: Betha Loa on 08/11/2023 06:56:48 [name], Kourtnei S ([id number]) [id number] Page  KIEARA, KOVALSKY S ([id number]) [id number] Page 1 of 7 Visit Report for 08/09/2023 Debridement Details Patient Name: Date of Service: [name] [name], Kemyah S. 08/09/2023 9:15 A M Medical Record Number: [id number] Patient Account Number: [id number] Date of Birth/Sex: Treating RN: 10/01/1976 (47 y.o. Freddy Finner Primary Care Provider: Olevia Perches Other Clinician: Betha Loa Referring Provider: Treating Provider/Extender: Chauncey Mann, MICHA EL Ashok Cordia, Megan Weeks in Treatment: 1 Debridement Performed for Assessment: Wound #1 Midline Back Performed By: Physician Maxwell Caul, MD The following information was scribed by: Betha Loa The information was scribed for: Holton Sidman G Debridement Type: Debridement Level of Consciousness (Pre-procedure): Awake and Alert Pre-procedure Verification/Time Out Yes - 10:00 Taken: Start Time: 10:00 Percent of Wound Bed Debrided: 100% T Area Debrided (cm): otal 10.01 Tissue and other material debrided: Viable, Non-Viable, Slough, Slough Level: Non-Viable Tissue Debridement Description: Selective/Open Wound Instrument: Curette Bleeding: Minimum Hemostasis Achieved: Pressure Response to Treatment: Procedure was tolerated well Level of Consciousness (Post- Awake and Alert procedure): Post Debridement Measurements of Total Wound Length: (cm) 8.5 Width: (cm) 1.5 Depth: (cm) 2.5 Volume: (cm) 25.035 Character of Wound/Ulcer Post Debridement: Stable Post Procedure Diagnosis Same as Pre-procedure Electronic Signature(s) Signed: 08/11/2023 10:28:30 AM By: Betha Loa Signed: 08/13/2023 11:21:49 AM By: Baltazar Najjar MD Signed: 08/14/2023 8:00:25 AM By: Yevonne Pax RN Previous Signature: 08/09/2023 5:46:43 PM Version By: Betha Loa Previous Signature: 08/10/2023 3:33:32 PM Version By: Baltazar Najjar MD Entered By: Betha Loa on 08/11/2023 06:56:32 [name],  Melisssa S ([id number]) [id number] Page 2 of 7 -------------------------------------------------------------------------------- HPI Details Patient Name: Date of Service: [name] [name], Emi S. 08/09/2023 9:15 A M Medical Record Number: [id number] Patient Account Number: [id number] Date of Birth/Sex: Treating RN: 1976/10/12 (47 y.o. 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 This was apparently a schwannoma according to the patient. She was admitted to hospital from 9/9 through 9/12 for surgical resection. She was discharged with a surgical wound that was sutured. The sutures were removed 2 weeks later and then about 3 or 4 days after that the wound dehisced. She was seen in the neurosurgery clinic on 10/3 with an open wound they gave her Keflex empirically which she has now finished and they used Medihoney to the midline wound. The patient has been applying this diligently. She has completed her antibiotics. The area is tender but no systemic symptoms. Past medical history includes bipolar disorder, hypertension, diabetes, coronary artery disease, COPD still smoking, she has recently had syncopal episodes and is wearing a heart monitor. 10/23; surgical wound at the site of a tumor resection. Arrived in clinic last visit with a completely necrotic surface. Extensive debridement. She was found to have a small tunnel right near the superior part of the wound drained a moderate amount of fluid. I cultured this and the results were negative. I am assuming this was a  small seroma. The patient is not having any pain and no systemic symptoms. We used Santyl and [employer]	[place]. UNFORTUNATELY the patient's wife was not in town this week and therefore she did not have anybody to change the dressing she has been coming in for nurse visits with [facility]

## 2023-08-14 NOTE — Progress Notes (Addendum)
[name], [name] ([id number]) [id number] Page 1 of 8 Visit Report for 08/09/2023 Arrival Information Details Patient Name: Date of Service: [name], [name]. 08/09/2023 9:15 A M Medical Record Number: [id number] Patient Account Number: [id number] Date of Birth/Sex: Treating RN: [birth year] (47 y.o. Freddy Finner Primary Care Breely Panik: Olevia Perches Other Clinician: Betha Loa Referring Dereke Neumann: Treating Addeline Calarco/Extender: RO BSO N, MICHA EL Ashok Cordia, Megan Weeks in Treatment: 1 Visit Information History Since Last Visit All ordered tests and consults were completed: No Patient Arrived: Ambulatory Added or deleted any medications: No Arrival Time: 09:27 Any new allergies or adverse reactions: No Transfer Assistance: None Had a fall or experienced change in No Patient Identification Verified: Yes activities of daily living that may affect Secondary Verification Process Completed: Yes risk of falls: Patient Requires Transmission-Based Precautions: No Signs or symptoms of abuse/neglect since last visito No Patient Has Alerts: Yes Hospitalized since last visit: No Patient Alerts: Diabetic Implantable device outside of the clinic excluding No cellular tissue based products placed in the center since last visit: Has Dressing in Place as Prescribed: Yes Pain Present Now: Yes Electronic Signature([name]) Signed: 08/09/2023 5:46:43 PM By: Betha Loa Entered By: Betha Loa on 08/09/2023 06:31:31 -------------------------------------------------------------------------------- Clinic Level of Care Assessment Details Patient Name: Date of Service: [name], [name]. 08/09/2023 9:15 A M Medical Record Number: [id number] Patient Account Number: [id number] Date of Birth/Sex: Treating RN: 03-08-1976 (47 y.o. Freddy Finner Primary Care Bernestine Holsapple: Olevia Perches Other Clinician: Betha Loa Referring  Hawk Mones: Treating Erienne Spelman/Extender: RO BSO N, MICHA EL Ashok Cordia, Megan Weeks in Treatment: 1 Clinic Level of Care Assessment Items TOOL 1 Quantity Score []  - 0 Use when EandM and Procedure is performed on INITIAL visit ASSESSMENTS - Nursing Assessment / Reassessment []  - 0 General Physical Exam (combine w/ comprehensive assessment (listed just below) when performed on new pt. evals) []  - 0 Comprehensive Assessment (HX, ROS, Risk Assessments, Wounds Hx, etc.) [name], [name] ([id number]) [id number] Page 2 of 8 ASSESSMENTS - Wound and Skin Assessment / Reassessment []  - 0 Dermatologic / Skin Assessment (not related to wound area) ASSESSMENTS - Ostomy and/or Continence Assessment and Care []  - 0 Incontinence Assessment and Management []  - 0 Ostomy Care Assessment and Management (repouching, etc.) PROCESS - Coordination of Care []  - 0 Simple Patient / Family Education for ongoing care []  - 0 Complex (extensive) Patient / Family Education for ongoing care []  - 0 Staff obtains Chiropractor, Records, T Results / Process Orders est []  - 0 Staff telephones HHA, Nursing Homes / Clarify orders / etc []  - 0 Routine Transfer to another Facility (non-emergent condition) []  - 0 Routine Hospital Admission (non-emergent condition) []  - 0 New Admissions / Manufacturing engineer / Ordering NPWT Apligraf, etc. , []  - 0 Emergency Hospital Admission (emergent condition) PROCESS - Special Needs []  - 0 Pediatric / Minor Patient Management []  - 0 Isolation Patient Management []  - 0 Hearing / Language / Visual special needs []  - 0 Assessment of Community assistance (transportation, D/C planning, etc.) []  - 0 Additional assistance / Altered mentation []  - 0 Support Surface([name]) Assessment (bed, cushion, seat, etc.) INTERVENTIONS - Miscellaneous []  - 0 External ear exam []  - 0 Patient Transfer (multiple staff / Nurse, adult / Similar devices) []  -  0 Simple Staple / Suture removal (25 or less) []  - 0 Complex Staple / Suture removal (26 or more) []  - 0 Hypo/Hyperglycemic Management (do not check if billed separately) []  -  0 Ankle / Brachial Index (ABI) - do not check if billed separately Has the patient been seen at the hospital within the last three years: Yes Total Score: 0 Level Of Care: ____ Electronic Signature([name]) Signed: 08/09/2023 5:46:43 PM By: Betha Loa Entered By: Betha Loa on 08/09/2023 07:04:46 -------------------------------------------------------------------------------- Encounter Discharge Information Details Patient Name: Date of Service: [name], [name]. 08/09/2023 9:15 A M Medical Record Number: [id number] Patient Account Number: [id number] Date of Birth/Sex: Treating RN: Nov 06, 1975 (47 y.o. Freddy Finner Primary Care Deangleo Passage: Olevia Perches Other Clinician: Betha Loa Referring Robson Trickey: Treating Rosa Gambale/Extender: [address] [place] Maker [place], [place] ([id number]) [id number] Page 3 of 8 Weeks in Treatment: 1 Encounter Discharge Information Items Post Procedure Vitals Discharge Condition: Stable Temperature (F): 97.9 Ambulatory Status: Ambulatory Pulse (bpm): 90 Discharge Destination: Home Respiratory Rate (breaths/min): 18 Transportation: Private Auto Blood Pressure (mmHg): 124/84 Accompanied By: self Schedule Follow-up Appointment: Yes Clinical Summary of Care: Electronic Signature([name]) Signed: 08/09/2023 5:46:43 PM By: Betha Loa Entered By: Betha Loa on 08/09/2023 14:42:59 -------------------------------------------------------------------------------- Lower Extremity Assessment Details Patient Name: Date of Service: [name], [name]. 08/09/2023 9:15 A M Medical Record Number: [id number] Patient Account Number: [id number] Date of Birth/Sex: Treating RN: [birth year] (47 y.o. Freddy Finner Primary Care Manoah Deckard: Olevia Perches Other Clinician: Betha Loa Referring Laysa Kimmey: Treating Haley Roza/Extender: RO BSO N, MICHA EL Ashok Cordia, Megan Weeks in Treatment: 1 Electronic Signature([name]) Signed: 08/09/2023 5:46:43 PM By: Betha Loa Signed: 08/14/2023 8:00:25 AM By: Yevonne Pax RN Entered By: Betha Loa on 08/09/2023 06:41:57 -------------------------------------------------------------------------------- Multi Wound Chart Details Patient Name: Date of Service: [name], [name]. 08/09/2023 9:15 A M Medical Record Number: [id number] Patient Account Number: [id number] Date of Birth/Sex: Treating RN: October 03, 1976 (46 y.o. Freddy Finner Primary Care Sharra Cayabyab: Olevia Perches Other Clinician: Betha Loa Referring Fin Hupp: Treating Leone Putman/Extender: RO BSO N, MICHA EL Ashok Cordia, Megan Weeks in Treatment: 1 Vital Signs Height(in): 72 Pulse(bpm): 90 Weight(lbs): 230.4 Blood Pressure(mmHg): 124/90 Body Mass Index(BMI): 31.2 Temperature(F): 97.9 Respiratory Rate(breaths/min): 18 [[name], [name] ([id number]):Photos:] [id number] Page 4 of 8:1 N/A N/A N/A N/A] Midline Back N/A N/A Wound Location: Surgical Injury N/A N/A Wounding Event: Open Surgical Wound N/A N/A Primary Etiology: Chronic Obstructive Pulmonary N/A N/A Comorbid History: Disease (COPD), Coronary Artery Disease, Type II Diabetes 06/26/2023 N/A N/A Date Acquired: 1 N/A N/A Weeks of Treatment: Open N/A N/A Wound Status: No N/A N/A Wound Recurrence: 8.5x1.5x2.5 N/A N/A Measurements L x W x D (cm) 10.014 N/A N/A A (cm) : rea 25.035 N/A N/A Volume (cm) : 1.90% N/A N/A % Reduction in Area: 38.70% N/A N/A % Reduction in Volume: Full Thickness Without Exposed N/A N/A Classification: Support Structures Medium N/A N/A Exudate Amount: Purulent N/A N/A Exudate Type: yellow, brown, green N/A N/A Exudate Color: Small (1-33%) N/A  N/A Granulation Amount: Pink, Pale N/A N/A Granulation Quality: Large (67-100%) N/A N/A Necrotic Amount: Fat Layer (Subcutaneous Tissue): Yes N/A N/A Exposed Structures: Fascia: No Tendon: No Muscle: No Joint: No Bone: No None N/A N/A Epithelialization: Treatment Notes Electronic Signature([name]) Signed: 08/09/2023 5:46:43 PM By: Betha Loa Entered By: Betha Loa on 08/09/2023 06:42:03 -------------------------------------------------------------------------------- Multi-Disciplinary Care Plan Details Patient Name: Date of Service: [name], Guiselle [name]. 08/09/2023 9:15 A M Medical Record Number: [id number] Patient Account Number: [id number] Date of Birth/Sex: Treating RN: 08/14/1976 (46 y.o. Freddy Finner Primary Care Katelyn Kohlmeyer: Olevia Perches Other Clinician: Betha Loa Referring Mally Gavina: Treating Levy Cedano/Extender: Loa Socks  BSO N, MICHA EL G [name], Megan Weeks in Treatment: 1 Active Inactive Psychologist, prison and probation services) Signed: 08/22/2023 4:08:56 PM By: Elliot Gurney, BSN, RN, CWS, Kim RN, BSN Signed: 08/30/2023 4:43:41 PM By: Yevonne Pax RN [name], Aiana SPM By: Yevonne Pax RN Signed: 08/30/2023 4:43:41 ([id number]) [id number] Page 5 of 8 Previous Signature: 08/09/2023 5:46:43 PM Version By: Betha Loa Previous Signature: 08/14/2023 8:00:25 AM Version By: Yevonne Pax RN Entered By: Elliot Gurney, BSN, RN, CWS, Kim on 08/22/2023 13:08:56 -------------------------------------------------------------------------------- Pain Assessment Details Patient Name: Date of Service: [name], Dejha [name]. 08/09/2023 9:15 A M Medical Record Number: [id number] Patient Account Number: [id number] Date of Birth/Sex: Treating RN: Apr 18, 1976 (46 y.o. Freddy Finner Primary Care Caelan Branden: Olevia Perches Other Clinician: Betha Loa Referring Beyounce Dickens: Treating Albirta Rhinehart/Extender: RO BSO N, MICHA EL Ashok Cordia, Megan Weeks in Treatment:  1 Active Problems Location of Pain Severity and Description of Pain Patient Has Paino Yes Site Locations Pain Location: Pain in Ulcers Duration of the Pain. Constant / Intermittento Constant Rate the pain. Current Pain Level: 2 Character of Pain Describe the Pain: Other: stinging Pain Management and Medication Current Pain Management: Medication: Yes Cold Application: No Rest: No Massage: No Activity: No T.E.N.[name].: No Heat Application: No Leg drop or elevation: No Is the Current Pain Management Adequate: Inadequate How does your wound impact your activities of daily livingo Sleep: No Bathing: No Appetite: No Relationship With Others: No Bladder Continence: No Emotions: No Bowel Continence: No Work: No Toileting: No Drive: No Dressing: No Hobbies: No Electronic Signature([name]) Signed: 08/09/2023 5:46:43 PM By: Betha Loa Signed: 08/14/2023 8:00:25 AM By: Yevonne Pax RN Entered By: Betha Loa on 08/09/2023 06:34:27 [name], Beryl [name] ([id number]) [id number] Page 6 of 8 -------------------------------------------------------------------------------- Patient/Caregiver Education Details Patient Name: Date of Service: [name], Latish Kathie Rhodes 10/23/2024andnbsp9:15 A M Medical Record Number: [id number] Patient Account Number: [id number] Date of Birth/Gender: Treating RN: 1976-07-02 (47 y.o. Freddy Finner Primary Care Physician: Olevia Perches Other Clinician: Betha Loa Referring Physician: Treating Physician/Extender: RO BSO Dorris Carnes, MICHA EL Ashok Cordia, Megan Weeks in Treatment: 1 Education Assessment Education Provided To: Patient Education Topics Provided Wound/Skin Impairment: Handouts: Other: continue wound care as directed Methods: Explain/Verbal Responses: State content correctly Electronic Signature([name]) Signed: 08/09/2023 5:46:43 PM By: Betha Loa Entered By: Betha Loa on 08/09/2023  07:05:14 -------------------------------------------------------------------------------- Wound Assessment Details Patient Name: Date of Service: [name], Maddux [name]. 08/09/2023 9:15 A M Medical Record Number: [id number] Patient Account Number: [id number] Date of Birth/Sex: Treating RN: March 25, 1976 (46 y.o. Freddy Finner Primary Care Jovonni Borquez: Olevia Perches Other Clinician: Betha Loa Referring Kayanna Mckillop: Treating Key Cen/Extender: RO BSO N, MICHA EL Ashok Cordia, Megan Weeks in Treatment: 1 Wound Status Wound Number: 1 Primary Open Surgical Wound Etiology: Wound Location: Midline Back Wound Healed - Surgical Closure Wounding Event: Surgical Injury Status: Date Acquired: 06/26/2023 Comorbid Chronic Obstructive Pulmonary Disease (COPD), Coronary Weeks Of Treatment: 1 History: Artery Disease, Type II Diabetes Clustered Wound: No Photos KHAMIYA, LEHRER [name] ([id number]) [id number] Page 7 of 8 Wound Measurements Length: (cm) Width: (cm) Depth: (cm) Area: (cm) Volume: (cm) 0 % Reduction in Area: 1.9% 0 % Reduction in Volume: 38.7% 0 Epithelialization: None 0 0 Wound Description Classification: Full Thickness Without Exposed Support Structures Exudate Amount: Medium Exudate Type: Purulent Exudate Color: yellow, brown, green Foul Odor After Cleansing: No Slough/Fibrino Yes Wound Bed Granulation Amount: Small (1-33%) Exposed Structure Granulation Quality: Pink, Pale Fascia Exposed: No Necrotic Amount: Large (67-100%) Fat Layer (Subcutaneous Tissue) Exposed: Yes Tendon Exposed:  No Muscle Exposed: No Joint Exposed: No Bone Exposed: No Treatment Notes Wound #1 (Back) Wound Laterality: Midline Cleanser Byram Ancillary Kit - 15 Day Supply Discharge Instruction: Use supplies as instructed; Kit contains: (15) Saline Bullets; (15) 3x3 Gauze; 15 pr Gloves Normal Saline Discharge Instruction: Wash your hands with soap and water.  Remove old dressing, discard into plastic bag and place into trash. Cleanse the wound with Normal Saline prior to applying a clean dressing using gauze sponges, not tissues or cotton balls. Do not scrub or use excessive force. Pat dry using gauze sponges, not tissue or cotton balls. Vashe 5.8 (oz) Discharge Instruction: Use vashe 5.8 (oz) as directed Peri-Wound Care Topical Santyl Collagenase Ointment, 30 (gm), tube Discharge Instruction: apply nickel thick to wound bed only Primary Dressing Hydrofera Blue Ready Transfer Foam, 4x5 (in/in) Discharge Instruction: Apply Hydrofera Blue Ready to wound bed as directed Secondary Dressing ABD Pad 5x9 (in/in) Discharge Instruction: Cover with ABD pad Secured With Medipore T - 84M Medipore H Soft Cloth Surgical T ape ape, 2x2 (in/yd) Compression Wrap Compression Stockings Add-Ons Electronic Signature([name]) Signed: 08/30/2023 4:43:41 PM By: Yevonne Pax RN Signed: 08/31/2023 5:11:45 PM By: Elliot Gurney, BSN, RN, CWS, Kim RN, BSN [name], [place] SPM By: Elliot Gurney, BSN, RN, CWS, Kim RN, BSN Signed: 08/31/2023 5:11:45 ([id number]) [id number] Page 8 of 8 Previous Signature: 08/09/2023 5:46:43 PM Version By: Betha Loa Previous Signature: 08/14/2023 8:00:25 AM Version By: Yevonne Pax RN Entered By: Elliot Gurney, BSN, RN, CWS, Kim on 08/22/2023 13:08:12 -------------------------------------------------------------------------------- Vitals Details Patient Name: Date of Service: [name], Shaliyah [name]. 08/09/2023 9:15 A M Medical Record Number: [id number] Patient Account Number: [id number] Date of Birth/Sex: Treating RN: Oct 13, 1976 (46 y.o. Freddy Finner Primary Care Ayoub Arey: Olevia Perches Other Clinician: Betha Loa Referring Raney Antwine: Treating Taiki Buckwalter/Extender: RO BSO N, MICHA EL Ashok Cordia, Megan Weeks in Treatment: 1 Vital Signs Time Taken: 09:32 Temperature (F): 97.9 Height (in): 72 Pulse (bpm):  90 Weight (lbs): 230.4 Respiratory Rate (breaths/min): 18 Body Mass Index (BMI): 31.2 Blood Pressure (mmHg): 124/90 Reference Range: 80 - 120 mg / dl Electronic Signature([name]) Signed: 08/09/2023 5:46:43 PM By: Betha Loa Entered By: Betha Loa on 08/09/2023 06:34:21

## 2023-08-14 NOTE — Transfer of Care (Signed)
Immediate Anesthesia Transfer of Care Note  Patient: Kera S [name]  Procedure(s) Performed: COMPLEX REPAIR OF WOUND DEHISCENCE  Patient Location: PACU  Anesthesia Type:General  Level of Consciousness: awake and drowsy  Airway & Oxygen Therapy: Patient Spontanous Breathing and Patient connected to face mask oxygen  Post-op Assessment: Report given to RN and Post -op Vital signs reviewed and stable  Post vital signs: Reviewed and stable  Last Vitals:  Vitals Value Taken Time  BP 134/67 08/14/23 1420  Temp 36.1 1421  Pulse 83 08/14/23 1423  Resp 21 08/14/23 1423  SpO2 96 % 08/14/23 1423  Vitals shown include unfiled device data.  Last Pain:  Vitals:   08/14/23 1017  TempSrc: Oral  PainSc: 5          Complications: No notable events documented.

## 2023-08-14 NOTE — Progress Notes (Signed)
All neuro assessments WNL.

## 2023-08-15 ENCOUNTER — Encounter: Payer: Self-pay | Admitting: Neurosurgery

## 2023-08-15 ENCOUNTER — Ambulatory Visit: Payer: BC Managed Care – PPO

## 2023-08-15 ENCOUNTER — Telehealth: Payer: Self-pay

## 2023-08-15 NOTE — Telephone Encounter (Signed)
Oxycodone has come back approved  Date:08/14/2023-09/14/2023 Case ID# [id number]

## 2023-08-15 NOTE — Telephone Encounter (Signed)
[name] called in. She was getting out of the car to go for a walk and her drain tube became disconnected from the bulb. She reconnected it. After discussing with Manning Charity, PA-C, I walked her through making sure the tube was connected to the bulb and making sure it was on suction. She was able to do this while on the phone with me. Her wound vac was audibly functioning in the background. I encouraged her to call our office with any additional questions or concerns.

## 2023-08-16 ENCOUNTER — Ambulatory Visit: Payer: BC Managed Care – PPO | Admitting: Internal Medicine

## 2023-08-16 NOTE — Anesthesia Postprocedure Evaluation (Signed)
Anesthesia Post Note  Patient: [name]  Procedure(s) Performed: COMPLEX REPAIR OF WOUND DEHISCENCE  Patient location during evaluation: PACU Anesthesia Type: General Level of consciousness: awake and alert Pain management: pain level controlled Vital Signs Assessment: post-procedure vital signs reviewed and stable Respiratory status: spontaneous breathing, nonlabored ventilation, respiratory function stable and patient connected to nasal cannula oxygen Cardiovascular status: blood pressure returned to baseline and stable Postop Assessment: no apparent nausea or vomiting Anesthetic complications: no   No notable events documented.   Last Vitals:  Vitals:   08/14/23 1539 08/14/23 1557  BP: (!) 113/57 127/80  Pulse: 72 77  Resp: 10 17  Temp:  (!) 36.1 C  SpO2: 100% 99%    Last Pain:  Vitals:   08/14/23 1557  TempSrc: Temporal  PainSc: 3                  Stephanie Coup

## 2023-08-19 LAB — AEROBIC/ANAEROBIC CULTURE W GRAM STAIN (SURGICAL/DEEP WOUND)
Culture: NO GROWTH
Gram Stain: NONE SEEN

## 2023-08-19 NOTE — Progress Notes (Unsigned)
   REFERRING PHYSICIAN:  Rosario Adie [address] [place],  [place] [address]  DOS: 08/14/23  Complex repair of wound dehiscence   DOS: 06/26/23 L1-2 laminectomy for tumor resection  Pathology: Schwannoma  HISTORY OF PRESENT ILLNESS: [name] is 1 week s/p complex repair of wound dehiscence.   She continues with a drain and a wound vac.   Per patient her drain put out 4cc in hospital, 11 cc the day she came home (08/14/23), the next day (08/15/23) she had 7cc in the morning and 10cc in the evening. Everyday since (08/16/23 and on) the drainage has been less than 2.5cc in morning and evening.   She has mild pain to her back- it feels better than it has since right after her first surgery. Back is not as tender and she feels like she can lean back in a chair and put pressure on it.   PHYSICAL EXAMINATION:  General: Patient is well developed, well nourished, calm, collected, and in no apparent distress.   NEUROLOGICAL:  General: In no acute distress.   Awake, alert, oriented to person, place, and time.  Pupils equal round and reactive to light.    Strength:         Side Iliopsoas Quads Hamstring PF DF EHL  R 5 5 5 5 5 5   L 5 5 5 5 5 5    Wound vac and drain removed without complication. No drainage from drain site or incision noted.   Incision clean and dry with staples intact. No surrounding erythema or fluctuance.    ROS (Neurologic):  Negative except as noted above  IMAGING: No interval imaging to review   ASSESSMENT/PLAN:  [name] is doing well after above surgery.   Treatment options reviewed with patient and following plan made:   - Dressing placed over incision and drain site. Can remove after 24 hours and shower.  - Reviewed wound care.  - No bending, twisting, or lifting.  - Follow up in 7-10 days for staple removal and then will likely have her see Dr. Myer Haff at 6 weeks.  - Initial FMLA expires in early December. Will discuss  further at her follow up.   Advised to contact the office if any questions or concerns arise.  Drake Leach PA-C Department of neurosurgery

## 2023-08-21 ENCOUNTER — Encounter: Payer: Self-pay | Admitting: Orthopedic Surgery

## 2023-08-21 ENCOUNTER — Ambulatory Visit (INDEPENDENT_AMBULATORY_CARE_PROVIDER_SITE_OTHER): Payer: BC Managed Care – PPO | Admitting: Orthopedic Surgery

## 2023-08-21 VITALS — BP 134/82 | Temp 97.6°F | Ht 72.0 in | Wt 230.0 lb

## 2023-08-21 DIAGNOSIS — D497 Neoplasm of unspecified behavior of endocrine glands and other parts of nervous system: Secondary | ICD-10-CM

## 2023-08-21 DIAGNOSIS — T8131XD Disruption of external operation (surgical) wound, not elsewhere classified, subsequent encounter: Secondary | ICD-10-CM

## 2023-08-21 DIAGNOSIS — Z09 Encounter for follow-up examination after completed treatment for conditions other than malignant neoplasm: Secondary | ICD-10-CM

## 2023-08-21 DIAGNOSIS — T8131XS Disruption of external operation (surgical) wound, not elsewhere classified, sequela: Secondary | ICD-10-CM

## 2023-08-22 ENCOUNTER — Telehealth: Payer: Self-pay | Admitting: Neurosurgery

## 2023-08-22 MED ORDER — FLUCONAZOLE 150 MG PO TABS: 150.0000 mg | ORAL_TABLET | Freq: Every day | ORAL | 0 refills | Status: DC

## 2023-08-22 NOTE — Telephone Encounter (Signed)
Per discussion with Stacy, I informed her not to put anything on her incision. She is currently taking benadryl, but the itching is the worst at night. I advised her to contact us if it is not improving or if the benadryl isn't helping.

## 2023-08-22 NOTE — Telephone Encounter (Signed)
Patient is calling to let our office know that she is having itching at her incision site that began around 6am this morning. She states that she has not looked to see if there is any bleeding, but that she is itching around the staples. She also states that she has taken Benadryl for the itching, but was advised to contact our office per her post-op instructions. She also states that she has developed a yeast infection due to the antibiotics she has been given and still has three days remaining. She would like to know if something can be sent into the pharmacy for her. Please advise.   [facility] in [place]

## 2023-08-22 NOTE — Telephone Encounter (Signed)
Incision looked good yesterday.   Please have her wife send picture when she gets home today. Any fevers or chills?   If she tolerates OTC benadryl she can take as directed on bottle.  I can send diflucan to her pharmacy. Sent to [facility] in [place].   What antibiotics is she taking? I don't see any on her med list.

## 2023-08-22 NOTE — Telephone Encounter (Signed)
I spoke with the patient. She denies fever or chills.  She will have her wife send a picture from last night and today as soon as she gets back from her walk.  I advised her to continue benadryl as needed/as directed on the bottle as long as she is tolerating it.   I informed her that [facility] sent in diflucan.  She is taking augmentin as prescribed by the wound clinic. She started the rx late b/c [facility] filled it late.   She will contact our office if her symptoms worsen/fail to improve.  I told her we will call her back after we look at the pictures.

## 2023-08-22 NOTE — Telephone Encounter (Signed)
See mychart message from 08/22/23.

## 2023-08-25 NOTE — Progress Notes (Unsigned)
   REFERRING PHYSICIAN:  Rosario Adie [address] [place],  [place] [address]  DOS: 08/14/23  Complex repair of wound dehiscence   DOS: 06/26/23 L1-2 laminectomy for tumor resection  Pathology: Schwannoma  HISTORY OF PRESENT ILLNESS: [name] is a little over 2 weeks s/p complex repair of wound dehiscence.   She has mild pain to her back- it feels better than it has since right after her first surgery. Back is not as tender and she feels like she can lean back in a chair and put pressure on it.    PHYSICAL EXAMINATION:  General: Patient is well developed, well nourished, calm, collected, and in no apparent distress.   NEUROLOGICAL:  General: In no acute distress.   Awake, alert, oriented to person, place, and time.  Pupils equal round and reactive to light.    Strength:         Side Iliopsoas Quads Hamstring PF DF EHL  R 5 5 5 5 5 5   L 5 5 5 5 5 5     Incision clean and dry with staples intact. No surrounding erythema or fluctuance. ***   ROS (Neurologic):  Negative except as noted above  IMAGING: No interval imaging to review   ASSESSMENT/PLAN:  [name] is doing well after above surgery.   Treatment options reviewed with patient and following plan made:   - Dressing placed over incision and drain site. Can remove after 24 hours and shower.  - Reviewed wound care.  - No bending, twisting, or lifting.  - Follow up in 7-10 days for staple removal and then will likely have her see Dr. Myer Haff at 6 weeks.  - Initial FMLA expires in early December. Will discuss further at her follow up.   Advised to contact the office if any questions or concerns arise.  Drake Leach PA-C Department of neurosurgery

## 2023-08-28 ENCOUNTER — Ambulatory Visit: Payer: BC Managed Care – PPO | Admitting: Orthopedic Surgery

## 2023-08-30 NOTE — Telephone Encounter (Signed)
error 

## 2023-08-31 ENCOUNTER — Encounter: Payer: Self-pay | Admitting: Orthopedic Surgery

## 2023-08-31 ENCOUNTER — Ambulatory Visit (INDEPENDENT_AMBULATORY_CARE_PROVIDER_SITE_OTHER): Payer: BC Managed Care – PPO | Admitting: Orthopedic Surgery

## 2023-08-31 VITALS — BP 118/80 | Ht 72.0 in | Wt 230.0 lb

## 2023-08-31 DIAGNOSIS — Z09 Encounter for follow-up examination after completed treatment for conditions other than malignant neoplasm: Secondary | ICD-10-CM

## 2023-08-31 DIAGNOSIS — D497 Neoplasm of unspecified behavior of endocrine glands and other parts of nervous system: Secondary | ICD-10-CM

## 2023-08-31 DIAGNOSIS — T8131XS Disruption of external operation (surgical) wound, not elsewhere classified, sequela: Secondary | ICD-10-CM

## 2023-08-31 DIAGNOSIS — T8131XD Disruption of external operation (surgical) wound, not elsewhere classified, subsequent encounter: Secondary | ICD-10-CM

## 2023-09-01 ENCOUNTER — Telehealth: Payer: Self-pay | Admitting: Orthopedic Surgery

## 2023-09-01 ENCOUNTER — Encounter: Payer: Self-pay | Admitting: Family Medicine

## 2023-09-01 NOTE — Telephone Encounter (Signed)
Ok for work note? 

## 2023-09-01 NOTE — Telephone Encounter (Signed)
Patient notified the work note is done, she states she can get it from [employer].

## 2023-09-01 NOTE — Telephone Encounter (Signed)
Okay to do work note. She will likely need to go back with lifting restriction, but can discuss with Danielle at follow up next month.

## 2023-09-01 NOTE — Telephone Encounter (Signed)
Patient was seen yesterday. Kennyth Arnold mentioned to her to be out of work until January. Can you put a work note in her chart that she will be out of work until 10/20/2023 due to complications from the first surgery. She will access it through her mychart.

## 2023-09-04 ENCOUNTER — Encounter: Payer: Self-pay | Admitting: Family Medicine

## 2023-09-06 ENCOUNTER — Encounter: Payer: Self-pay | Admitting: Neurosurgery

## 2023-09-08 ENCOUNTER — Other Ambulatory Visit: Payer: Self-pay | Admitting: Family Medicine

## 2023-09-11 ENCOUNTER — Encounter: Payer: Self-pay | Admitting: Family Medicine

## 2023-09-11 ENCOUNTER — Ambulatory Visit (INDEPENDENT_AMBULATORY_CARE_PROVIDER_SITE_OTHER): Payer: BC Managed Care – PPO | Admitting: Family Medicine

## 2023-09-11 VITALS — BP 107/73 | HR 79 | Temp 97.3°F | Resp 16 | Ht 72.0 in | Wt 230.0 lb

## 2023-09-11 DIAGNOSIS — Z7984 Long term (current) use of oral hypoglycemic drugs: Secondary | ICD-10-CM | POA: Diagnosis not present

## 2023-09-11 DIAGNOSIS — E1122 Type 2 diabetes mellitus with diabetic chronic kidney disease: Secondary | ICD-10-CM | POA: Diagnosis not present

## 2023-09-11 DIAGNOSIS — N181 Chronic kidney disease, stage 1: Secondary | ICD-10-CM | POA: Diagnosis not present

## 2023-09-11 LAB — BAYER DCA HB A1C WAIVED: HB A1C (BAYER DCA - WAIVED): 6.4 % — ABNORMAL HIGH (ref 4.8–5.6)

## 2023-09-11 NOTE — Telephone Encounter (Signed)
Requested Prescriptions  Pending Prescriptions Disp Refills   montelukast (SINGULAIR) 10 MG tablet [Pharmacy Med Name: MONTELUKAST 10MG  TABLETS] 90 tablet 0    Sig: TAKE 1 TABLET(10 MG) BY MOUTH AT BEDTIME     Pulmonology:  Leukotriene Inhibitors Passed - 09/08/2023  8:01 AM      Passed - Valid encounter within last 12 months    Recent Outpatient Visits           1 month ago Tobacco use   [place] [facility], [name], [name]   1 month ago Syncope, unspecified syncope type   [place] [facility] [place], [name], [name]   3 months ago Preop exam for internal medicine   [place] [facility], [name], [name]   5 months ago 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, without long-term current use of insulin (HCC)   [place] [facility] [place], [name], [name]   8 months ago 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, without long-term current use of insulin (HCC)   [place] [facility] [place], [place], [name]       Future Appointments             Today [name] Carrow, [name] [facility], [facility]   In 1 week [name] Bathe, [name] [facility], [facility]             metFORMIN (GLUCOPHAGE) 500 MG tablet [Pharmacy Med Name: METFORMIN 500MG  TABLETS] 180 tablet 0    Sig: TAKE 1 TABLET(500 MG) BY MOUTH TWICE DAILY WITH A MEAL     Endocrinology:  Diabetes - Biguanides Failed - 09/08/2023  8:01 AM      Failed - HBA1C is between 0 and 7.9 and within 180 days    HB A1C (BAYER DCA - WAIVED)  Date Value Ref Range Status  06/09/2023 8.6 (H) 4.8 - 5.6 % Final    Comment:             Prediabetes: 5.7 - 6.4          Diabetes: >6.4          Glycemic control for adults with diabetes: <7.0          Failed - B12 Level in normal range and within 720 days    Vitamin B-12  Date Value Ref Range Status  03/20/2020  >2000 (H) 232 - 1245 pg/mL Final         Passed - Cr in normal range and within 360 days    Creatinine  Date Value Ref Range Status  04/09/2014 0.89 0.60 - 1.30 mg/dL Final   Creatinine, Ser  Date Value Ref Range Status  07/21/2023 0.82 0.44 - 1.00 mg/dL Final         Passed - eGFR in normal range and within 360 days    EGFR (African American)  Date Value Ref Range Status  04/09/2014 >60  Final   GFR calc Af Amer  Date Value Ref Range Status  11/26/2020 104 >59 mL/min/1.73 Final    Comment:    **In accordance with recommendations from the NKF-ASN Task force,**   [facility] is in the process of updating its eGFR calculation to the   2021 CKD-EPI creatinine equation that estimates kidney function   without a race variable.    EGFR (Non-African Amer.)  Date Value Ref Range Status  04/09/2014 >60  Final    Comment:    eGFR values <45mL/min/1.73 m2 may be an indication of chronic kidney disease (CKD). Calculated eGFR is useful in patients with stable renal function. The eGFR calculation will not be reliable in acutely ill patients when serum creatinine is changing rapidly. It is not useful in  patients on dialysis. The eGFR calculation may not be applicable to patients at the low and high extremes of body sizes, pregnant women, and vegetarians.    GFR, Estimated  Date Value Ref Range Status  07/21/2023 >60 >60 mL/min Final    Comment:    (NOTE) Calculated using the CKD-EPI Creatinine Equation (2021)    eGFR  Date Value Ref Range Status  06/09/2023 109 >59 mL/min/1.73 Final         Passed - Valid encounter within last 6 months    Recent Outpatient Visits           1 month ago Tobacco use   [place] [facility] [place], [name], [name]   1 month ago Syncope, unspecified syncope type   [place] [place], [name], [name]   3 months ago Preop exam for internal medicine   [facility], [name], [name]    5 months ago 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, without long-term current use of insulin (HCC)   [place] [facility] [place], [name], [name]   8 months ago 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, without long-term current use of insulin (HCC)   [place] [facility], [place], [name]       Future Appointments             Today [name] Carrow, [name] [facility], [facility]   In 1 week [name] Bathe, [name] [facility], [facility]            Passed - CBC within normal limits and completed in the last 12 months    WBC  Date Value Ref Range Status  07/21/2023 9.1 4.0 - 10.5 K/uL Final   RBC  Date Value Ref Range Status  07/21/2023 5.85 (H) 3.87 - 5.11 MIL/uL Final   Hemoglobin  Date Value Ref Range Status  07/21/2023 15.4 (H) 12.0 - 15.0 g/dL Final  08/02/5101 58.5 11.1 - 15.9 g/dL Final   HCT  Date Value Ref Range Status  07/21/2023 47.1 (H) 36.0 - 46.0 % Final   Hematocrit  Date Value Ref Range Status  06/09/2023 42.9 34.0 - 46.6 % Final   MCHC  Date Value Ref Range Status  07/21/2023 32.7 30.0 - 36.0 g/dL Final   [facility]  Date Value Ref Range Status  07/21/2023 26.3 26.0 - 34.0 pg Final   MCV  Date Value Ref Range Status  07/21/2023 80.5 80.0 - 100.0 fL Final  06/09/2023 82 79 - 97 fL Final  04/09/2014 77 (L) 80 - 100 fL Final   No results found for: "PLTCOUNTKUC", "LABPLAT", "POCPLA" RDW  Date Value Ref Range Status  07/21/2023 15.3 11.5 - 15.5 % Final  06/09/2023 14.7 11.7 - 15.4 % Final  04/09/2014 15.5 (H) 11.5 - 14.5 % Final

## 2023-09-11 NOTE — Assessment & Plan Note (Signed)
Doing great with A1c of 6.4 down from 8.6! Continue current regimen. Continue to monitor. Recheck 3 months.

## 2023-09-11 NOTE — Telephone Encounter (Signed)
Requested medication (s) are due for refill today: yes  Requested medication (s) are on the active medication list: yes  Last refill:  06/14/23 #180  Future visit scheduled: yes  Notes to clinic:  has OV today -  Hgb A1C out of range   Requested Prescriptions  Pending Prescriptions Disp Refills   metFORMIN (GLUCOPHAGE) 500 MG tablet [Pharmacy Med Name: METFORMIN 500MG  TABLETS] 180 tablet 0    Sig: TAKE 1 TABLET(500 MG) BY MOUTH TWICE DAILY WITH A MEAL     Endocrinology:  Diabetes - Biguanides Failed - 09/08/2023  8:01 AM      Failed - HBA1C is between 0 and 7.9 and within 180 days    HB A1C (BAYER DCA - WAIVED)  Date Value Ref Range Status  06/09/2023 8.6 (H) 4.8 - 5.6 % Final    Comment:             Prediabetes: 5.7 - 6.4          Diabetes: >6.4          Glycemic control for adults with diabetes: <7.0          Failed - B12 Level in normal range and within 720 days    Vitamin B-12  Date Value Ref Range Status  03/20/2020 >2000 (H) 232 - 1245 pg/mL Final         Passed - Cr in normal range and within 360 days    Creatinine  Date Value Ref Range Status  04/09/2014 0.89 0.60 - 1.30 mg/dL Final   Creatinine, Ser  Date Value Ref Range Status  07/21/2023 0.82 0.44 - 1.00 mg/dL Final         Passed - eGFR in normal range and within 360 days    EGFR (African American)  Date Value Ref Range Status  04/09/2014 >60  Final   GFR calc Af Amer  Date Value Ref Range Status  11/26/2020 104 >59 mL/min/1.73 Final    Comment:    **In accordance with recommendations from the NKF-ASN Task force,**   [facility] is in the process of updating its eGFR calculation to the   2021 CKD-EPI creatinine equation that estimates kidney function   without a race variable.    EGFR (Non-African Amer.)  Date Value Ref Range Status  04/09/2014 >60  Final    Comment:    eGFR values <76mL/min/1.73 m2 may be an indication of chronic kidney disease (CKD). Calculated eGFR is useful in patients with  stable renal function. The eGFR calculation will not be reliable in acutely ill patients when serum creatinine is changing rapidly. It is not useful in  patients on dialysis. The eGFR calculation may not be applicable to patients at the low and high extremes of body sizes, pregnant women, and vegetarians.    GFR, Estimated  Date Value Ref Range Status  07/21/2023 >60 >60 mL/min Final    Comment:    (NOTE) Calculated using the CKD-EPI Creatinine Equation (2021)    eGFR  Date Value Ref Range Status  06/09/2023 109 >59 mL/min/1.73 Final         Passed - Valid encounter within last 6 months    Recent Outpatient Visits           1 month ago Tobacco use   [facility] [place], Megan P, DO   1 month ago Syncope, unspecified syncope type   [place] [facility], Megan P, DO   3 months ago Preop  exam for internal medicine   [place] [facility] [place], [place] P, DO   5 months ago 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, without long-term current use of insulin (HCC)   [place] [facility] [place], Megan P, DO   8 months ago 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, without long-term current use of insulin (HCC)   [place] [facility] [place], [place], DO       Future Appointments             Today Dorcas Carrow, DO [place] [facility], [facility]   In 1 week Jake Bathe, MD [facility], [facility]            Passed - CBC within normal limits and completed in the last 12 months    WBC  Date Value Ref Range Status  07/21/2023 9.1 4.0 - 10.5 K/uL Final   RBC  Date Value Ref Range Status  07/21/2023 5.85 (H) 3.87 - 5.11 MIL/uL Final   Hemoglobin  Date Value Ref Range Status  07/21/2023 15.4 (H) 12.0 - 15.0 g/dL Final  25/36/6440 34.7 11.1 - 15.9 g/dL Final   HCT  Date  Value Ref Range Status  07/21/2023 47.1 (H) 36.0 - 46.0 % Final   Hematocrit  Date Value Ref Range Status  06/09/2023 42.9 34.0 - 46.6 % Final   MCHC  Date Value Ref Range Status  07/21/2023 32.7 30.0 - 36.0 g/dL Final   [facility]  Date Value Ref Range Status  07/21/2023 26.3 26.0 - 34.0 pg Final   MCV  Date Value Ref Range Status  07/21/2023 80.5 80.0 - 100.0 fL Final  06/09/2023 82 79 - 97 fL Final  04/09/2014 77 (L) 80 - 100 fL Final   No results found for: "PLTCOUNTKUC", "LABPLAT", "POCPLA" RDW  Date Value Ref Range Status  07/21/2023 15.3 11.5 - 15.5 % Final  06/09/2023 14.7 11.7 - 15.4 % Final  04/09/2014 15.5 (H) 11.5 - 14.5 % Final         Signed Prescriptions Disp Refills   montelukast (SINGULAIR) 10 MG tablet 90 tablet 0    Sig: TAKE 1 TABLET(10 MG) BY MOUTH AT BEDTIME     Pulmonology:  Leukotriene Inhibitors Passed - 09/08/2023  8:01 AM      Passed - Valid encounter within last 12 months    Recent Outpatient Visits           1 month ago Tobacco use   [place] [facility] [place], Megan P, DO   1 month ago Syncope, unspecified syncope type   [place] [facility] [place], Megan P, DO   3 months ago Preop exam for internal medicine   [facility] [place], Megan P, DO   5 months ago 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

## 2023-09-11 NOTE — Progress Notes (Signed)
BP 107/73 (BP Location: Left Wrist, Patient Position: Sitting, Cuff Size: Normal)   Pulse 79   Temp (!) 97.3 F (36.3 C) (Oral)   Resp 16   Ht 6' (1.829 m)   Wt 230 lb (104.3 kg)   LMP 07/13/2020 (Approximate)   SpO2 96%   BMI 31.19 kg/m    Subjective:    Patient ID: [name], female    DOB: Oct 23, 1975, 47 y.o.   MRN: [id number]  HPI: [name] is a 47 y.o. female  Chief Complaint  Patient presents with   Nicotine Dependence    Quit date 08/11/2023   Diabetes    A1c check   Was able to quit smoking. Doing OK. No cravings.   DIABETES Hypoglycemic episodes:no Polydipsia/polyuria: no Visual disturbance: no Chest pain: no Paresthesias: no Glucose Monitoring: yes Taking Insulin?: no Blood Pressure Monitoring: not checking Retinal Examination: Up to Date Foot Exam: Not up to Date Diabetic Education: Completed Pneumovax: Up to Date Influenza: Up to Date Aspirin: yes   Relevant past medical, surgical, family and social history reviewed and updated as indicated. Interim medical history since our last visit reviewed. Allergies and medications reviewed and updated.  Review of Systems  Constitutional: Negative.   Respiratory: Negative.    Cardiovascular: Negative.   Gastrointestinal: Negative.   Musculoskeletal: Negative.   Psychiatric/Behavioral: Negative.      Per HPI unless specifically indicated above     Objective:    BP 107/73 (BP Location: Left Wrist, Patient Position: Sitting, Cuff Size: Normal)   Pulse 79   Temp (!) 97.3 F (36.3 C) (Oral)   Resp 16   Ht 6' (1.829 m)   Wt 230 lb (104.3 kg)   LMP 07/13/2020 (Approximate)   SpO2 96%   BMI 31.19 kg/m   Wt Readings from Last 3 Encounters:  09/11/23 230 lb (104.3 kg)  08/31/23 230 lb (104.3 kg)  08/21/23 230 lb (104.3 kg)    Physical Exam Vitals and nursing note reviewed.  Constitutional:      General: She is not in acute distress.    Appearance: Normal  appearance. She is obese. She is not ill-appearing, toxic-appearing or diaphoretic.  HENT:     Head: Normocephalic and atraumatic.     Right Ear: External ear normal.     Left Ear: External ear normal.     Nose: Nose normal.     Mouth/Throat:     Mouth: Mucous membranes are moist.     Pharynx: Oropharynx is clear.  Eyes:     General: No scleral icterus.       Right eye: No discharge.        Left eye: No discharge.     Extraocular Movements: Extraocular movements intact.     Conjunctiva/sclera: Conjunctivae normal.     Pupils: Pupils are equal, round, and reactive to light.  Cardiovascular:     Rate and Rhythm: Normal rate and regular rhythm.     Pulses: Normal pulses.     Heart sounds: Normal heart sounds. No murmur heard.    No friction rub. No gallop.  Pulmonary:     Effort: Pulmonary effort is normal. No respiratory distress.     Breath sounds: Normal breath sounds. No stridor. No wheezing, rhonchi or rales.  Chest:     Chest wall: No tenderness.  Musculoskeletal:        General: Normal range of motion.     Cervical back: Normal range of motion and neck  supple.  Skin:    General: Skin is warm and dry.     Capillary Refill: Capillary refill takes less than 2 seconds.     Coloration: Skin is not jaundiced or pale.     Findings: No bruising, erythema, lesion or rash.  Neurological:     General: No focal deficit present.     Mental Status: She is alert and oriented to person, place, and time. Mental status is at baseline.  Psychiatric:        Mood and Affect: Mood normal.        Behavior: Behavior normal.        Thought Content: Thought content normal.        Judgment: Judgment normal.     Results for orders placed or performed during the hospital encounter of 08/14/23  Aerobic/Anaerobic Culture w Gram Stain (surgical/deep wound)   Specimen: Wound  Result Value Ref Range   Specimen Description      LUMBAR Performed at [facility], [address]  [place], [place] [address]    Special Requests      WD Performed at [facility], [address] [place], [place] [address]    Gram Stain NO WBC SEEN NO ORGANISMS SEEN     Culture      No growth aerobically or anaerobically. Performed at [facility], [address] [place], [place] [address]    Report Status 08/19/2023 FINAL   Glucose, capillary  Result Value Ref Range   Glucose-Capillary 162 (H) 70 - 99 mg/dL  Glucose, capillary  Result Value Ref Range   Glucose-Capillary 169 (H) 70 - 99 mg/dL      Assessment & Plan:   Problem List Items Addressed This Visit       Endocrine   Controlled type 2 diabetes mellitus with renal manifestation (HCC) - Primary    Doing great with A1c of 6.4 down from 8.6! Continue current regimen. Continue to monitor. Recheck 3 months.       Relevant Orders   Bayer DCA Hb A1c Waived     Follow up plan: Return in about 3 months (around 12/12/2023).

## 2023-09-15 ENCOUNTER — Other Ambulatory Visit: Payer: Self-pay | Admitting: Family Medicine

## 2023-09-18 NOTE — Telephone Encounter (Signed)
Requested Prescriptions  Pending Prescriptions Disp Refills   traZODone (DESYREL) 150 MG tablet [Pharmacy Med Name: TRAZODONE 150MG  (HUNDRED-FIFTY) TAB] 180 tablet 1    Sig: TAKE 1 TABLET BY MOUTH AT BEDTIME     Psychiatry: Antidepressants - Serotonin Modulator Passed - 09/15/2023  3:20 AM      Passed - Valid encounter within last 6 months    Recent Outpatient Visits           1 week ago 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, without long-term current use of insulin (HCC)   [place] [facility] [place], Megan P, DO   1 month ago Tobacco use   [facility] [place], Megan P, DO   1 month ago Syncope, unspecified syncope type   [facility] [place], Megan P, DO   3 months ago Preop exam for internal medicine   [place] [facility] [place], Megan P, DO   5 months ago 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, without long-term current use of insulin (HCC)   [place] [facility] [place], Oralia Rud, DO       Future Appointments             In 4 days [place], Veverly Fells, MD [facility], [facility]   In 2 months Laural [place], Oralia Rud, DO [facility], [facility]

## 2023-09-19 ENCOUNTER — Ambulatory Visit (INDEPENDENT_AMBULATORY_CARE_PROVIDER_SITE_OTHER): Payer: BC Managed Care – PPO | Admitting: Neurosurgery

## 2023-09-19 ENCOUNTER — Encounter: Payer: BC Managed Care – PPO | Admitting: Orthopedic Surgery

## 2023-09-19 ENCOUNTER — Encounter: Payer: Self-pay | Admitting: Neurosurgery

## 2023-09-19 VITALS — BP 118/76 | Temp 97.6°F | Wt 229.4 lb

## 2023-09-19 DIAGNOSIS — Z09 Encounter for follow-up examination after completed treatment for conditions other than malignant neoplasm: Secondary | ICD-10-CM

## 2023-09-19 DIAGNOSIS — T8131XD Disruption of external operation (surgical) wound, not elsewhere classified, subsequent encounter: Secondary | ICD-10-CM

## 2023-09-19 DIAGNOSIS — D497 Neoplasm of unspecified behavior of endocrine glands and other parts of nervous system: Secondary | ICD-10-CM

## 2023-09-19 DIAGNOSIS — T8131XS Disruption of external operation (surgical) wound, not elsewhere classified, sequela: Secondary | ICD-10-CM

## 2023-09-19 NOTE — Progress Notes (Signed)
   REFERRING PHYSICIAN:  Rosario Adie [address] [place],  [place] [address]  DOS: 08/14/23  Complex repair of wound dehiscence   DOS: 06/26/23 L1-2 laminectomy for tumor resection  Pathology: Schwannoma  HISTORY OF PRESENT ILLNESS: 09/19/23 Ms. [name] presents today roughly 5 weeks s/p complex wound repair. She is doing very well without concerns today.  She has had 1 more episode of vertigo which improved with meclizine.  She was seen by ENT and diagnosed with benign positional vertigo.  08/31/23 Stacy Osker Mason S [name] is a little over 2 weeks s/p complex repair of wound dehiscence.   Her back pain continues to improve. No drainage noted.   PHYSICAL EXAMINATION:  General: Patient is well developed, well nourished, calm, collected, and in no apparent distress.   NEUROLOGICAL:  General: In no acute distress.   Awake, alert, oriented to person, place, and time.    Incision: Healing well  Normal gait.    ROS (Neurologic):  Negative except as noted above  IMAGING: No interval imaging to review   ASSESSMENT/PLAN:  Zollie S [name] is doing well after above surgeries.  Her incision is healing well with 1 very small area of scabbing.  There is no signs of dehiscence or infection.  She is to return to work on 10/20/2023 and has a note for this.  She will send [place] a more specific note required from her work for [place] to fill out.  She is to continue with lifting restrictions until she is 3 months out from her most recent surgery.  I discussed her case with Dr. Myer Haff who would like to see her back in a month or so with a repeat lumbar MRI with and without contrast prior.  She was encouraged to call the office in the interim should she have any questions or concerns.  She expressed understanding and was in agreement with this plan.   Manning Charity PA-C Department of neurosurgery

## 2023-09-22 ENCOUNTER — Ambulatory Visit: Payer: BC Managed Care – PPO | Admitting: Cardiology

## 2023-09-26 ENCOUNTER — Encounter: Payer: Self-pay | Admitting: Neurosurgery

## 2023-09-27 NOTE — Telephone Encounter (Signed)
"  She is to return to work on 10/20/2023 and has a note for this. She will send [place] a more specific note required from her work for [place] to fill out. She is to continue with lifting restrictions until she is 3 months out from her most recent surgery."  Can you clarify her lifting restriction please.

## 2023-10-03 ENCOUNTER — Other Ambulatory Visit: Payer: Self-pay | Admitting: Family Medicine

## 2023-10-03 NOTE — Telephone Encounter (Signed)
Requested Prescriptions  Pending Prescriptions Disp Refills   metoprolol succinate (TOPROL-XL) 25 MG 24 hr tablet [Pharmacy Med Name: METOPROLOL ER SUCCINATE 25MG  TABS] 90 tablet 1    Sig: TAKE 1/2 TO 1 TABLET(12.5 TO 25 MG) BY MOUTH DAILY     Cardiovascular:  Beta Blockers Passed - 10/03/2023  3:16 PM      Passed - Last BP in normal range    BP Readings from Last 1 Encounters:  09/19/23 118/76         Passed - Last Heart Rate in normal range    Pulse Readings from Last 1 Encounters:  09/11/23 79         Passed - Valid encounter within last 6 months    Recent Outpatient Visits           3 weeks ago 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, without long-term current use of insulin (HCC)   [place] [facility] [place], [place], DO   2 months ago Tobacco use   [place] [facility] [place], [place] P, DO   2 months ago Syncope, unspecified syncope type   [place] [facility] [place], Megan P, DO   3 months ago Preop exam for internal medicine   [place] [facility] [place], Megan P, DO   6 months ago 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, without long-term current use of insulin (HCC)   [place] [facility] [place], [place], DO       Future Appointments             In 2 months [name], Megan P, DO [place] [facility]             naproxen (NAPROSYN) 500 MG tablet [Pharmacy Med Name: NAPROXEN 500MG  TABLETS] 180 tablet 1    Sig: TAKE 1 TABLET(500 MG) BY MOUTH TWICE DAILY WITH A MEAL     Analgesics:  NSAIDS Failed - 10/03/2023  3:16 PM      Failed - Manual Review: Labs are only required if the patient has taken medication for more than 8 weeks.      Failed - HGB in normal range and within 360 days    Hemoglobin  Date Value Ref Range Status  07/21/2023 15.4 (H) 12.0 - 15.0 g/dL Final  19/14/7829 56.2 11.1 -  15.9 g/dL Final         Failed - HCT in normal range and within 360 days    HCT  Date Value Ref Range Status  07/21/2023 47.1 (H) 36.0 - 46.0 % Final   Hematocrit  Date Value Ref Range Status  06/09/2023 42.9 34.0 - 46.6 % Final         Passed - Cr in normal range and within 360 days    Creatinine  Date Value Ref Range Status  04/09/2014 0.89 0.60 - 1.30 mg/dL Final   Creatinine, Ser  Date Value Ref Range Status  07/21/2023 0.82 0.44 - 1.00 mg/dL Final         Passed - PLT in normal range and within 360 days    Platelets  Date Value Ref Range Status  07/21/2023 252 150 - 400 K/uL Final  06/09/2023 201 150 - 450 x10E3/uL Final         Passed - eGFR is 30 or above and within 360 days    EGFR (African American)  Date Value  Ref Range Status  04/09/2014 >60  Final   GFR calc Af Amer  Date Value Ref Range Status  11/26/2020 104 >59 mL/min/1.73 Final    Comment:    **In accordance with recommendations from the NKF-ASN Task force,**   [facility] is in the process of updating its eGFR calculation to the   2021 CKD-EPI creatinine equation that estimates kidney function   without a race variable.    EGFR (Non-African Amer.)  Date Value Ref Range Status  04/09/2014 >60  Final    Comment:    eGFR values <16mL/min/1.73 m2 may be an indication of chronic kidney disease (CKD). Calculated eGFR is useful in patients with stable renal function. The eGFR calculation will not be reliable in acutely ill patients when serum creatinine is changing rapidly. It is not useful in  patients on dialysis. The eGFR calculation may not be applicable to patients at the low and high extremes of body sizes, pregnant women, and vegetarians.    GFR, Estimated  Date Value Ref Range Status  07/21/2023 >60 >60 mL/min Final    Comment:    (NOTE) Calculated using the CKD-EPI Creatinine Equation (2021)    eGFR  Date Value Ref Range Status  06/09/2023 109 >59 mL/min/1.73 Final         Passed  - Patient is not pregnant      Passed - Valid encounter within last 12 months    Recent Outpatient Visits           3 weeks ago 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, without long-term current use of insulin (HCC)   [place] [facility] [place], Megan P, DO   2 months ago Tobacco use   [place] [facility] [place], Megan P, DO   2 months ago Syncope, unspecified syncope type   [place] [facility] [place], Megan P, DO   3 months ago Preop exam for internal medicine   [place] [facility] [place], Megan P, DO   6 months ago 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, without long-term current use of insulin (HCC)   [place] [facility] [place], Oralia Rud, DO       Future Appointments             In 2 months [place] Benes, Oralia Rud, DO  [facility], [facility]

## 2023-10-13 ENCOUNTER — Other Ambulatory Visit: Payer: Self-pay | Admitting: Family Medicine

## 2023-10-16 ENCOUNTER — Encounter: Payer: Self-pay | Admitting: Internal Medicine

## 2023-10-17 NOTE — Telephone Encounter (Signed)
 Requested medication (s) are due for refill today: For review  Requested medication (s) are on the active medication list: yes    Last refill: 08/11/23  #60  2 refills  Future visit scheduled yes 12/12/23  Notes to clinic:Titrated dosing, please review.  Requested Prescriptions  Pending Prescriptions Disp Refills   buPROPion  (WELLBUTRIN  SR) 150 MG 12 hr tablet [Pharmacy Med Name: BUPROPION  SR 150MG  TABLETS (12 H)] 60 tablet 2    Sig: TAKE ONE TABLET BY MOUTH IN THE MORNING FOR 3 DAYS THEN INCREASE TO ONE TABLET TWICE DAILY, QUIT IN THE SECOND WEEK     Psychiatry: Antidepressants - bupropion  Passed - 10/17/2023  4:52 PM      Passed - Cr in normal range and within 360 days    Creatinine  Date Value Ref Range Status  04/09/2014 0.89 0.60 - 1.30 mg/dL Final   Creatinine, Ser  Date Value Ref Range Status  07/21/2023 0.82 0.44 - 1.00 mg/dL Final         Passed - AST in normal range and within 360 days    AST  Date Value Ref Range Status  07/21/2023 22 15 - 41 U/L Final         Passed - ALT in normal range and within 360 days    ALT  Date Value Ref Range Status  07/21/2023 27 0 - 44 U/L Final         Passed - Last BP in normal range    BP Readings from Last 1 Encounters:  09/19/23 118/76         Passed - Valid encounter within last 6 months    Recent Outpatient Visits           1 month ago 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, without long-term current use of insulin  (HCC)   [place] [facility] [place], Megan P, DO   2 months ago Tobacco use   [place] [facility] [place], Megan P, DO   2 months ago Syncope, unspecified syncope type   [place] [facility] [place], Megan P, DO   4 months ago Preop exam for internal medicine   [place] [facility] [place], Megan P, DO   6 months ago 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, without long-term  current use of insulin  [facility])   [place] [facility] [place], Duwaine SQUIBB, DO       Future Appointments             In 1 month [name], Duwaine SQUIB[place], DO [place] [facility], [facility]

## 2023-10-19 NOTE — Telephone Encounter (Signed)
 [facility] Medication Refill Request  Last non-acute appointment needs to be within 12 months YES  Last office visit: 09/11/2023   Next office visit: 12/12/2023

## 2023-10-23 ENCOUNTER — Encounter: Payer: Self-pay | Admitting: Internal Medicine

## 2023-10-25 ENCOUNTER — Other Ambulatory Visit: Payer: Self-pay | Admitting: Family Medicine

## 2023-10-27 ENCOUNTER — Encounter: Payer: Self-pay | Admitting: Internal Medicine

## 2023-10-27 NOTE — Telephone Encounter (Signed)
 Requested Prescriptions  Pending Prescriptions Disp Refills   lisinopril  (ZESTRIL ) 5 MG tablet [Pharmacy Med Name: LISINOPRIL  5MG  TABLETS] 90 tablet 1    Sig: TAKE 1 TABLET(5 MG) BY MOUTH DAILY     Cardiovascular:  ACE Inhibitors Passed - 10/27/2023  3:51 PM      Passed - Cr in normal range and within 180 days    Creatinine  Date Value Ref Range Status  04/09/2014 0.89 0.60 - 1.30 mg/dL Final   Creatinine, Ser  Date Value Ref Range Status  07/21/2023 0.82 0.44 - 1.00 mg/dL Final         Passed - K in normal range and within 180 days    Potassium  Date Value Ref Range Status  07/21/2023 4.1 3.5 - 5.1 mmol/L Final  04/09/2014 4.2 3.5 - 5.1 mmol/L Final         Passed - Patient is not pregnant      Passed - Last BP in normal range    BP Readings from Last 1 Encounters:  09/19/23 118/76         Passed - Valid encounter within last 6 months    Recent Outpatient Visits           1 month ago 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, without long-term current use of insulin  (HCC)   [place] [facility] [place], Megan P, DO   2 months ago Tobacco use   [place] [facility] [place], Megan P, DO   3 months ago Syncope, unspecified syncope type   [place] [facility] [place], Megan P, DO   4 months ago Preop exam for internal medicine   [place] [facility] [place], Megan P, DO   6 months ago 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, without long-term current use of insulin  [facility])   [place] [facility] [place], [place], DO       Future Appointments             In 1 month [name], Megan P, DO [place] [facility]             pantoprazole  (PROTONIX ) 40 MG tablet [Pharmacy Med Name: PANTOPRAZOLE  40MG  TABLETS] 180 tablet 1    Sig: TAKE 1 TABLET(40 MG) BY MOUTH TWICE DAILY     Gastroenterology: Proton Pump  Inhibitors Passed - 10/27/2023  3:51 PM      Passed - Valid encounter within last 12 months    Recent Outpatient Visits           1 month ago 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, without long-term current use of insulin  (HCC)   [place] [facility] [place], Megan P, DO   2 months ago Tobacco use   [place] [facility] [place], Megan P, DO   3 months ago Syncope, unspecified syncope type   [place] [facility], Megan P, DO   4 months ago Preop exam for internal medicine   [place] [facility] [place], Megan P, DO   6 months ago 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, without long-term current use of insulin  [facility])   [place] [facility] [place], Megan P, DO       Future Appointments             In 1  month Vicci, Duwaine SQUIBB, DO [place] [facility], [facility]

## 2023-10-30 ENCOUNTER — Ambulatory Visit
Admission: RE | Admit: 2023-10-30 | Discharge: 2023-10-30 | Disposition: A | Discharge status: Home / Self Care | Payer: 59 | Source: Ambulatory Visit | Attending: Neurosurgery | Admitting: Neurosurgery

## 2023-10-30 DIAGNOSIS — D497 Neoplasm of unspecified behavior of endocrine glands and other parts of nervous system: Secondary | ICD-10-CM | POA: Insufficient documentation

## 2023-10-30 MED ORDER — GADOBUTROL 1 MMOL/ML IV SOLN
10.0000 mL | Freq: Once | INTRAVENOUS | Status: AC | PRN
  Administered 2023-10-30: 10 mL via INTRAVENOUS

## 2023-11-02 ENCOUNTER — Encounter: Payer: BC Managed Care – PPO | Admitting: Neurosurgery

## 2023-11-16 ENCOUNTER — Encounter: Payer: Self-pay | Admitting: Neurosurgery

## 2023-11-16 ENCOUNTER — Ambulatory Visit: Payer: 59 | Admitting: Neurosurgery

## 2023-11-16 VITALS — BP 142/88 | Ht 72.0 in | Wt 229.0 lb

## 2023-11-16 DIAGNOSIS — Z09 Encounter for follow-up examination after completed treatment for conditions other than malignant neoplasm: Secondary | ICD-10-CM

## 2023-11-16 DIAGNOSIS — T8131XD Disruption of external operation (surgical) wound, not elsewhere classified, subsequent encounter: Secondary | ICD-10-CM

## 2023-11-16 DIAGNOSIS — D497 Neoplasm of unspecified behavior of endocrine glands and other parts of nervous system: Secondary | ICD-10-CM | POA: Diagnosis not present

## 2023-11-16 NOTE — Progress Notes (Signed)
   REFERRING PHYSICIAN:  Rosario Adie [address] [place],  [place] [address]  DOS: 08/14/23  Complex repair of wound dehiscence   DOS: 06/26/23 L1-2 laminectomy for tumor resection  Pathology: Schwannoma  HISTORY OF PRESENT ILLNESS: 11/16/2023 She is doing very well after her surgery.  She has no evidence of infection.  She is having some discomfort in her back.  09/19/23 [name] presents today roughly 5 weeks s/p complex wound repair. She is doing very well without concerns today.  She has had 1 more episode of vertigo which improved with meclizine.  She was seen by ENT and diagnosed with benign positional vertigo.  08/31/23 [name] is a little over 2 weeks s/p complex repair of wound dehiscence.   Her back pain continues to improve. No drainage noted.   PHYSICAL EXAMINATION:  General: Patient is well developed, well nourished, calm, collected, and in no apparent distress.   NEUROLOGICAL:  General: In no acute distress.   Awake, alert, oriented to person, place, and time.    Incision: Healing well  Normal gait.    ROS (Neurologic):  Negative except as noted above  IMAGING: MRI L spine 10/30/2023 IMPRESSION: 1. No evidence of residual or recurrent enhancing tumor associated with the upper lumbar region nerve roots. 2. Fluid collection in the posterior midline without clear communication with the spinal canal, favored to represent a postoperative seroma. Pseudomeningocele not completely excluded but not favored.     Electronically Signed   By: Paulina Fusi M.D.   On: 11/08/2023 15:48  ASSESSMENT/PLAN:  [name] is doing well after above surgeries.  There is no evidence of recurrence on her MRI scan.  I agree with the radiologist that this is most likely a postoperative seroma.  There is no evidence of infection.    We discussed stopping her gabapentin.  She will begin using naproxen to help with her  muscular pain.  If that is not enough to help with her pain, I have asked her to contact me and we will add a muscle relaxant.  She is off all bending, lifting and twisting restrictions.  I will repeat her MRI scan with and without contrast in a year.  Venetia Night MD Department of neurosurgery

## 2023-12-09 ENCOUNTER — Other Ambulatory Visit: Payer: Self-pay | Admitting: Family Medicine

## 2023-12-11 ENCOUNTER — Encounter: Payer: Self-pay | Admitting: Family Medicine

## 2023-12-11 NOTE — Telephone Encounter (Signed)
 Requested Prescriptions  Pending Prescriptions Disp Refills   montelukast (SINGULAIR) 10 MG tablet [Pharmacy Med Name: MONTELUKAST 10MG  TABLETS] 90 tablet 0    Sig: TAKE 1 TABLET(10 MG) BY MOUTH AT BEDTIME     Pulmonology:  Leukotriene Inhibitors Passed - 12/11/2023  4:28 PM      Passed - Valid encounter within last 12 months    Recent Outpatient Visits           3 months ago 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, without long-term current use of insulin (HCC)   [place] [facility] [place], Megan P, DO   4 months ago Tobacco use   [place] [facility] [place], Megan P, DO   4 months ago Syncope, unspecified syncope type   [place] [facility] [place], Megan P, DO   6 months ago Preop exam for internal medicine   [place] [facility] [place], Megan P, DO   8 months ago 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, without long-term current use of insulin [facility])   [place] [facility] [place], Oralia Rud, DO       Future Appointments             [place] Dorcas [place], DO [place] [facility]

## 2023-12-12 ENCOUNTER — Ambulatory Visit: Payer: 59 | Admitting: Family Medicine

## 2023-12-12 ENCOUNTER — Encounter: Payer: Self-pay | Admitting: Internal Medicine

## 2023-12-12 ENCOUNTER — Encounter: Payer: Self-pay | Admitting: Family Medicine

## 2023-12-12 VITALS — BP 131/82 | HR 90 | Temp 98.7°F | Resp 18 | Wt 229.0 lb

## 2023-12-12 DIAGNOSIS — Z1231 Encounter for screening mammogram for malignant neoplasm of breast: Secondary | ICD-10-CM

## 2023-12-12 DIAGNOSIS — Z720 Tobacco use: Secondary | ICD-10-CM | POA: Diagnosis not present

## 2023-12-12 DIAGNOSIS — I129 Hypertensive chronic kidney disease with stage 1 through stage 4 chronic kidney disease, or unspecified chronic kidney disease: Secondary | ICD-10-CM

## 2023-12-12 DIAGNOSIS — Z0000 Encounter for general adult medical examination without abnormal findings: Secondary | ICD-10-CM

## 2023-12-12 DIAGNOSIS — D500 Iron deficiency anemia secondary to blood loss (chronic): Secondary | ICD-10-CM

## 2023-12-12 DIAGNOSIS — E781 Pure hyperglyceridemia: Secondary | ICD-10-CM

## 2023-12-12 DIAGNOSIS — Z20818 Contact with and (suspected) exposure to other bacterial communicable diseases: Secondary | ICD-10-CM

## 2023-12-12 DIAGNOSIS — Z23 Encounter for immunization: Secondary | ICD-10-CM

## 2023-12-12 DIAGNOSIS — I2510 Atherosclerotic heart disease of native coronary artery without angina pectoris: Secondary | ICD-10-CM

## 2023-12-12 DIAGNOSIS — E1122 Type 2 diabetes mellitus with diabetic chronic kidney disease: Secondary | ICD-10-CM

## 2023-12-12 DIAGNOSIS — J42 Unspecified chronic bronchitis: Secondary | ICD-10-CM

## 2023-12-12 DIAGNOSIS — N898 Other specified noninflammatory disorders of vagina: Secondary | ICD-10-CM

## 2023-12-12 DIAGNOSIS — F3175 Bipolar disorder, in partial remission, most recent episode depressed: Secondary | ICD-10-CM

## 2023-12-12 MED ORDER — MONTELUKAST SODIUM 10 MG PO TABS: ORAL_TABLET | ORAL | 1 refills | Status: DC

## 2023-12-12 MED ORDER — TRAZODONE HCL 150 MG PO TABS: 150.0000 mg | ORAL_TABLET | Freq: Every day | ORAL | 1 refills | Status: DC

## 2023-12-12 MED ORDER — BUPROPION HCL ER (SR) 150 MG PO TB12: 150.0000 mg | ORAL_TABLET | Freq: Two times a day (BID) | ORAL | 1 refills | Status: DC

## 2023-12-12 MED ORDER — EMPAGLIFLOZIN 25 MG PO TABS: 25.0000 mg | ORAL_TABLET | Freq: Every day | ORAL | 1 refills | Status: DC

## 2023-12-12 MED ORDER — METFORMIN HCL 500 MG PO TABS: ORAL_TABLET | ORAL | 1 refills | Status: DC

## 2023-12-12 MED ORDER — FLUCONAZOLE 150 MG PO TABS: 150.0000 mg | ORAL_TABLET | Freq: Every day | ORAL | 0 refills | Status: DC

## 2023-12-12 MED ORDER — RYBELSUS 3 MG PO TABS: 3.0000 mg | ORAL_TABLET | Freq: Every day | ORAL | Status: DC

## 2023-12-12 MED ORDER — LISINOPRIL 5 MG PO TABS: 5.0000 mg | ORAL_TABLET | Freq: Every day | ORAL | 1 refills | Status: DC

## 2023-12-12 MED ORDER — METOPROLOL SUCCINATE ER 25 MG PO TB24: ORAL_TABLET | ORAL | 1 refills | Status: DC

## 2023-12-12 MED ORDER — RYBELSUS 7 MG PO TABS: 7.0000 mg | ORAL_TABLET | Freq: Every day | ORAL | 1 refills | Status: DC
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
## 2023-12-12 NOTE — Patient Instructions (Signed)
 Please call to schedule your mammogram: [facility] at [facility]  Address: [address], [place], [place] [address] Phone: [phone]  [facility] at [facility] [address] [place],  [place]  [address] Phone: [phone]

## 2023-12-13 LAB — COMPREHENSIVE METABOLIC PANEL
ALT: 32 [IU]/L (ref 0–32)
AST: 26 [IU]/L (ref 0–40)
Albumin: 5 g/dL — ABNORMAL HIGH (ref 3.9–4.9)
Alkaline Phosphatase: 101 [IU]/L (ref 44–121)
BUN/Creatinine Ratio: 31 — ABNORMAL HIGH (ref 9–23)
BUN: 24 mg/dL (ref 6–24)
Bilirubin Total: 0.3 mg/dL (ref 0.0–1.2)
CO2: 19 mmol/L — ABNORMAL LOW (ref 20–29)
Calcium: 10.6 mg/dL — ABNORMAL HIGH (ref 8.7–10.2)
Chloride: 100 mmol/L (ref 96–106)
Creatinine, Ser: 0.78 mg/dL (ref 0.57–1.00)
Globulin, Total: 2.6 g/dL (ref 1.5–4.5)
Glucose: 146 mg/dL — ABNORMAL HIGH (ref 70–99)
Potassium: 4.3 mmol/L (ref 3.5–5.2)
Sodium: 138 mmol/L (ref 134–144)
Total Protein: 7.6 g/dL (ref 6.0–8.5)
eGFR: 95 mL/min/{1.73_m2} (ref >59)

## 2023-12-13 LAB — CBC WITH DIFFERENTIAL/PLATELET
Basophils Absolute: 0.1 10*3/uL (ref 0.0–0.2)
Basos: 1 %
EOS (ABSOLUTE): 0.3 10*3/uL (ref 0.0–0.4)
Eos: 4 %
Hematocrit: 47.7 % — ABNORMAL HIGH (ref 34.0–46.6)
Hemoglobin: 15.5 g/dL (ref 11.1–15.9)
Immature Grans (Abs): 0 10*3/uL (ref 0.0–0.1)
Immature Granulocytes: 1 %
Lymphocytes Absolute: 3.4 10*3/uL — ABNORMAL HIGH (ref 0.7–3.1)
Lymphs: 42 %
MCH: 26.1 pg — ABNORMAL LOW (ref 26.6–33.0)
MCHC: 32.5 g/dL (ref 31.5–35.7)
MCV: 80 fL (ref 79–97)
Monocytes Absolute: 0.7 10*3/uL (ref 0.1–0.9)
Monocytes: 8 %
Neutrophils Absolute: 3.6 10*3/uL (ref 1.4–7.0)
Neutrophils: 44 %
Platelets: 245 10*3/uL (ref 150–450)
RBC: 5.93 x10E6/uL — ABNORMAL HIGH (ref 3.77–5.28)
RDW: 14.6 % (ref 11.7–15.4)
WBC: 8.1 10*3/uL (ref 3.4–10.8)

## 2023-12-13 LAB — LIPID PANEL W/O CHOL/HDL RATIO
Cholesterol, Total: 174 mg/dL (ref 100–199)
HDL: 32 mg/dL — ABNORMAL LOW (ref >39)
LDL Chol Calc (NIH): 81 mg/dL (ref 0–99)
Triglycerides: 379 mg/dL — ABNORMAL HIGH (ref 0–149)
VLDL Cholesterol Cal: 61 mg/dL — ABNORMAL HIGH (ref 5–40)

## 2023-12-13 LAB — HEPATITIS C ANTIBODY: Hep C Virus Ab: NONREACTIVE

## 2023-12-13 LAB — WET PREP FOR TRICH, YEAST, CLUE
Clue Cell Exam: NEGATIVE
Trichomonas Exam: NEGATIVE
Yeast Exam: POSITIVE — AB

## 2023-12-13 LAB — MICROALBUMIN, URINE WAIVED
Creatinine, Urine Waived: 100 mg/dL (ref 10–300)
Microalb, Ur Waived: 30 mg/L — ABNORMAL HIGH (ref 0–19)
Microalb/Creat Ratio: <30 mg/g (ref <30)

## 2023-12-13 LAB — TSH: TSH: 1.48 u[IU]/mL (ref 0.450–4.500)

## 2023-12-13 LAB — BAYER DCA HB A1C WAIVED: HB A1C (BAYER DCA - WAIVED): 7.3 % — ABNORMAL HIGH (ref 4.8–5.6)

## 2023-12-14 ENCOUNTER — Encounter: Payer: Self-pay | Admitting: Family Medicine

## 2023-12-14 LAB — CULTURE, GROUP A STREP

## 2023-12-14 LAB — RAPID STREP SCREEN (MED CTR MEBANE ONLY): Strep Gp A Ag, IA W/Reflex: NEGATIVE

## 2023-12-14 NOTE — Assessment & Plan Note (Signed)
 Will restart her wellbutrin. Call with any concerns. Continue to monitor.

## 2023-12-14 NOTE — Assessment & Plan Note (Signed)
 Rechecking labs today. Await results. Treat as needed.

## 2023-12-14 NOTE — Assessment & Plan Note (Signed)
 Under good control on current regimen. Continue current regimen. Continue to monitor. Call with any concerns. Refills given. Labs drawn today.

## 2023-12-14 NOTE — Assessment & Plan Note (Signed)
 Up slightly with A1c of 7.3. Will restart her rybelsus and not push the dose. Call with any concerns. Continue to monitor. Call with any concerns. Refills given today.

## 2023-12-14 NOTE — Assessment & Plan Note (Signed)
 Will keep sugars, BP and cholesterol under good control. Continue to monitor. Call with any concerns.

## 2023-12-14 NOTE — Assessment & Plan Note (Signed)
 Not doing well. Continue to follow with psychiatry. Call with any concerns. Continue to monitor.

## 2023-12-17 ENCOUNTER — Other Ambulatory Visit: Payer: Self-pay | Admitting: Family Medicine

## 2023-12-18 ENCOUNTER — Encounter: Payer: Self-pay | Admitting: Family Medicine

## 2023-12-18 NOTE — Telephone Encounter (Signed)
 Requested medications are due for refill today.  yes  Requested medications are on the active medications list.  yes  Last refill. 06/29/2023 #60 1 rf  Future visit scheduled.   yes  Notes to clinic.  Refill not delegated.    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, without long-term current use of insulin (HCC)   [place] [facility] [place], Megan P, DO   4 months ago Tobacco use   [place] [facility] [place], Megan P, DO   4 months ago Syncope, unspecified syncope type   [place] [facility] [place], Megan P, DO   6 months ago Preop exam for internal medicine   [place] [facility] [place], Megan P, DO   8 months ago 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

## 2024-01-06 ENCOUNTER — Other Ambulatory Visit: Payer: Self-pay | Admitting: Family Medicine

## 2024-01-08 NOTE — Telephone Encounter (Signed)
 Requested Prescriptions  Refused Prescriptions Disp Refills   JARDIANCE 25 MG TABS tablet [Pharmacy Med Name: JARDIANCE 25MG  TABLETS] 30 tablet     Sig: TAKE 1 TABLET(25 MG) BY MOUTH DAILY BEFORE BREAKFAST     Endocrinology:  Diabetes - SGLT2 Inhibitors Passed - 01/08/2024  2:57 PM      Passed - Cr in normal range and within 360 days    Creatinine  Date Value Ref Range Status  04/09/2014 0.89 0.60 - 1.30 mg/dL Final   Creatinine, Ser  Date Value Ref Range Status  12/12/2023 0.78 0.57 - 1.00 mg/dL Final         Passed - HBA1C is between 0 and 7.9 and within 180 days    HB A1C (BAYER DCA - WAIVED)  Date Value Ref Range Status  12/12/2023 7.3 (H) 4.8 - 5.6 % Final    Comment:             Prediabetes: 5.7 - 6.4          Diabetes: >6.4          Glycemic control for adults with diabetes: <7.0          Passed - eGFR in normal range and within 360 days    EGFR (African American)  Date Value Ref Range Status  04/09/2014 >60  Final   GFR calc Af Amer  Date Value Ref Range Status  11/26/2020 104 >59 mL/min/1.73 Final    Comment:    **In accordance with recommendations from the NKF-ASN Task force,**   [facility] is in the process of updating its eGFR calculation to the   2021 CKD-EPI creatinine equation that estimates kidney function   without a race variable.    EGFR (Non-African Amer.)  Date Value Ref Range Status  04/09/2014 >60  Final    Comment:    eGFR values <82mL/min/1.73 m2 may be an indication of chronic kidney disease (CKD). Calculated eGFR is useful in patients with stable renal function. The eGFR calculation will not be reliable in acutely ill patients when serum creatinine is changing rapidly. It is not useful in  patients on dialysis. The eGFR calculation may not be applicable to patients at the low and high extremes of body sizes, pregnant women, and vegetarians.    GFR, Estimated  Date Value Ref Range Status  07/21/2023 >60 >60 mL/min Final    Comment:     (NOTE) Calculated using the CKD-EPI Creatinine Equation (2021)    eGFR  Date Value Ref Range Status  12/12/2023 95 >59 mL/min/1.73 Final         Passed - Valid encounter within last 6 months    Recent Outpatient Visits           3 months ago 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, without long-term current use of insulin (HCC)   [place] [place], Megan P, DO   5 months ago Tobacco use   [place] [place], Megan P, DO   5 months ago Syncope, unspecified syncope type   [place] [facility], Megan P, DO   7 months ago Preop exam for internal medicine   [facility], Megan P, DO   9 months ago 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, without long-term current use of insulin [facility])   [facility] [place], [place], DO  Future Appointments             In 2 months Laural Benes, Oralia Rud, DO [place] [facility]

## 2024-01-09 ENCOUNTER — Other Ambulatory Visit: Payer: Self-pay | Admitting: Family Medicine

## 2024-01-11 NOTE — Telephone Encounter (Signed)
 Requested medication (s) are due for refill today:   Requested medication (s) are on the active medication list: Yes  Last refill:  12/12/23  Future visit scheduled: Yes  Notes to clinic:  Manual review.    Requested Prescriptions  Pending Prescriptions Disp Refills   fluconazole (DIFLUCAN) 150 MG tablet [Pharmacy Med Name: FLUCONAZOLE 150MG  TABLETS] 2 tablet 0    Sig: TAKE 1 TABLET(150 MG) BY MOUTH DAILY     Off-Protocol Failed - 01/11/2024  1:01 PM      Failed - Medication not assigned to a protocol, review manually.      Passed - Valid encounter within last 12 months    Recent Outpatient Visits           1 month ago Routine general medical examination at a health care facility   [facility] Dorcas Carrow, DO       Future Appointments             In 2 months Laural Benes, Oralia Rud, DO [facility], [facility]

## 2024-01-19 ENCOUNTER — Other Ambulatory Visit: Payer: Self-pay | Admitting: Family Medicine

## 2024-01-22 ENCOUNTER — Inpatient Hospital Stay
Admission: RE | Admit: 2024-01-22 | Discharge: 2024-01-22 | Disposition: A | Discharge status: Home / Self Care | Payer: Self-pay | Source: Ambulatory Visit | Attending: Orthopedic Surgery | Admitting: Orthopedic Surgery

## 2024-01-22 ENCOUNTER — Other Ambulatory Visit: Payer: Self-pay | Admitting: Family Medicine

## 2024-01-22 DIAGNOSIS — Z049 Encounter for examination and observation for unspecified reason: Secondary | ICD-10-CM

## 2024-01-22 MED ORDER — FLUCONAZOLE 150 MG PO TABS: 150.0000 mg | ORAL_TABLET | Freq: Every day | ORAL | 0 refills | Status: DC
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
## 2024-01-31 ENCOUNTER — Encounter: Payer: Self-pay | Admitting: Orthopedic Surgery

## 2024-01-31 ENCOUNTER — Ambulatory Visit: Admitting: Orthopedic Surgery

## 2024-01-31 VITALS — BP 136/84 | Ht 72.0 in | Wt 241.0 lb

## 2024-01-31 DIAGNOSIS — M4310 Spondylolisthesis, site unspecified: Secondary | ICD-10-CM | POA: Diagnosis not present

## 2024-01-31 DIAGNOSIS — D497 Neoplasm of unspecified behavior of endocrine glands and other parts of nervous system: Secondary | ICD-10-CM

## 2024-01-31 DIAGNOSIS — M47816 Spondylosis without myelopathy or radiculopathy, lumbar region: Secondary | ICD-10-CM | POA: Diagnosis not present

## 2024-01-31 DIAGNOSIS — M545 Low back pain, unspecified: Secondary | ICD-10-CM

## 2024-01-31 DIAGNOSIS — M546 Pain in thoracic spine: Secondary | ICD-10-CM

## 2024-01-31 DIAGNOSIS — W19XXXA Unspecified fall, initial encounter: Secondary | ICD-10-CM

## 2024-01-31 MED ORDER — OXYCODONE HCL 5 MG PO TABS: 5.0000 mg | ORAL_TABLET | Freq: Three times a day (TID) | ORAL | 0 refills | Status: DC | PRN

## 2024-01-31 NOTE — Patient Instructions (Signed)
 It was so nice to see you today. Thank you so much for coming in.    I am sorry that you are hurting so much after the fall.   I want to get an MRI of your lower back to look into things further. We will get this approved through your insurance and [facility] will call you to schedule the appointment. Ask about your patient responsibility. You do not need to pay this prior to getting MRI, they can bill you.   After you have the MRI, it takes 14-21 days for me to get the results back. Once I have them, we will call you to schedule a follow up visit with me to review them. Call or message me if you don't see the results after a week.   I sent a small refill of oxycodone to your pharmacy to take for severe pain. Remember this can make you sleepy and/or constipated. Take only as needed.   Please do not hesitate to call if you have any questions or concerns. You can also message me in MyChart.   Lucetta Russel PA-C [phone]     The physicians and staff at [facility] Neurosurgery at [facility] are committed to providing excellent care. You may receive a survey asking for feedback about your experience at our office. We value you your feedback and appreciate you taking the time to to fill it out. The [facility] leadership team is also available to discuss your experience in person, feel free to contact us  [phone].

## 2024-01-31 NOTE — Addendum Note (Signed)
 Addended byLucetta Russel on: 01/31/2024 03:51 PM   Modules accepted: Orders

## 2024-02-01 ENCOUNTER — Ambulatory Visit
Admission: RE | Admit: 2024-02-01 | Discharge: 2024-02-01 | Disposition: A | Discharge status: Home / Self Care | Source: Ambulatory Visit | Attending: Family Medicine | Admitting: Family Medicine

## 2024-02-01 DIAGNOSIS — Z1231 Encounter for screening mammogram for malignant neoplasm of breast: Secondary | ICD-10-CM | POA: Diagnosis present

## 2024-02-03 ENCOUNTER — Ambulatory Visit

## 2024-02-04 ENCOUNTER — Ambulatory Visit
Admission: RE | Admit: 2024-02-04 | Discharge: 2024-02-04 | Disposition: A | Discharge status: Home / Self Care | Source: Ambulatory Visit | Attending: Orthopedic Surgery | Admitting: Orthopedic Surgery

## 2024-02-04 DIAGNOSIS — M47816 Spondylosis without myelopathy or radiculopathy, lumbar region: Secondary | ICD-10-CM | POA: Insufficient documentation

## 2024-02-04 DIAGNOSIS — D497 Neoplasm of unspecified behavior of endocrine glands and other parts of nervous system: Secondary | ICD-10-CM | POA: Insufficient documentation

## 2024-02-04 DIAGNOSIS — M546 Pain in thoracic spine: Secondary | ICD-10-CM | POA: Insufficient documentation

## 2024-02-04 DIAGNOSIS — M4310 Spondylolisthesis, site unspecified: Secondary | ICD-10-CM | POA: Diagnosis present

## 2024-02-04 DIAGNOSIS — M545 Low back pain, unspecified: Secondary | ICD-10-CM | POA: Diagnosis present

## 2024-02-06 ENCOUNTER — Encounter: Payer: Self-pay | Admitting: Family Medicine

## 2024-02-11 ENCOUNTER — Other Ambulatory Visit: Payer: Self-pay | Admitting: Family Medicine

## 2024-02-13 NOTE — Telephone Encounter (Signed)
 Please call her to set up follow up with me to review her MRI results. Can do phone or in person, whichever she prefers.

## 2024-02-13 NOTE — Telephone Encounter (Signed)
 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

## 2024-02-13 NOTE — Telephone Encounter (Signed)
 Unable to refill per protocol, Rx expired. Discontinued 01/31/24, course completed.  Requested Prescriptions  Pending Prescriptions Disp Refills   fluconazole  (DIFLUCAN ) 150 MG tablet [Pharmacy Med Name: FLUCONAZOLE  150MG  TABLETS] 2 tablet 0    Sig: TAKE 1 TABLET(150 MG) BY MOUTH DAILY     Off-Protocol Failed - 02/13/2024 12:22 PM      Failed - Medication not assigned to a protocol, review manually.      Passed - Valid encounter within last 12 months    Recent Outpatient Visits           2 months ago Routine general medical examination at a health care facility   [facility] Solomon Dupre, DO       Future Appointments             In 1 month Lincoln Renshaw, Jerilee Montane, DO [place] [facility]

## 2024-02-14 NOTE — Progress Notes (Signed)
 Telephone Visit- Progress Note: Referring Physician:  No referring provider defined for this encounter.  Primary Physician:  Solomon Dupre, DO  This visit was performed via telephone.  Patient location: home Provider location: office  I spent a total of 10 minutes non-face-to-face activities for this visit on the date of this encounter including review of current clinical condition and response to treatment.    Patient has given verbal consent to this telephone visits and we reviewed the limitations of a telephone visit. Patient wishes to proceed.    Chief Complaint:  review imaging  History of Present Illness: [name] is a 48 y.o. female has a history of CAD, COPD, NSAID induced gastric ulcer, DM, CKD, bipolar.    She is s/p L1-L2 laminectomy for tumor resection (schwannoma) on 06/26/23. She then had complex repair of wound dehiscence on 08/14/23.   Last seen by me on 01/31/24- she had a fall on 12/29/23 with increased back pain from lower thoracic region into her lower back. No leg pain.   She has some wedging of T12 and L1- these don't appear to be acute compression fractures. She has spondylosis with retrolisthesis at L3-L4.   MRI of thoracic and lumbar spine ordered and phone visit scheduled to review them.   She is about the same. She continues with constant LBP that is worse as the day progresses. No leg pain. No numbness, tingling, or weakness in her legs. She feels popping in her back at times and also feels intermittent swelling.   Care with NSAIDs due to history of gastric ulcer. She is taking flexeril  and neurontin . Given oxycodone  at her last visit.    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   Past Surgery:  Complex repair of wound dehiscence on 08/14/23 L1-L2 laminectomy for tumor resection (schwannoma) on 06/26/23   The symptoms are causing a significant impact on the patient's life.   Exam: No exam done as this was a  telephone encounter.     Imaging: Thoracic MRI dated 02/04/24:  FINDINGS: Alignment:  Normal.   Vertebrae: No acute fracture, suspicious marrow lesion, or evidence of discitis. Unchanged lower thoracic Schmorl's nodes.   Cord:  Normal signal and morphology.   Paraspinal and other soft tissues: 1 cm T2 hyperintense focus in the right hepatic lobe, corresponding to a hypodensity on a 11/01/2021 abdominal CT and unchanged in size, likely reflecting a cyst or other benign etiology.   Disc levels:   Small left paracentral disc protrusion at T4-5, small central disc protrusion at T6-7, mild multilevel disc bulging in the lower thoracic spine, and up to moderate mid and lower thoracic facet hypertrophy, unchanged and without compressive stenosis.   IMPRESSION: Unchanged thoracic disc and facet degeneration without compressive stenosis.     Electronically Signed   By: Aundra Lee M.D.   On: 02/13/2024 08:53   Lumbar MRI dated 02/04/24:  FINDINGS: Segmentation:  Standard.   Alignment:  Normal.   Vertebrae: No acute fracture, suspicious marrow lesion, or evidence of discitis. Unchanged small Schmorl's nodes in the upper lumbar and lower thoracic spine. Posterior decompression at L1-2 for tumor resection.   Conus medullaris and cauda equina: Conus extends to the L1 level and is normal in appearance. No evidence of recurrent tumor on this unenhanced study.   Paraspinal and other soft tissues: Fluid collection in the midline from T12-L2 measuring 3.5 x 2.0 x 7.2 cm, decreased in size from the prior MRI and  without communication with the spinal canal, favored to represent a postoperative seroma.   Disc levels:   Disc desiccation from T12-L1 through L4-5. Mild disc space narrowing from T12-L1 through L3-4.   T12-L1: Mild spondylosis.  No disc herniation or stenosis.   L1-2: Mild spondylosis and facet hypertrophy. No disc herniation or stenosis.   L2-3: Minimal disc  bulging and mild facet hypertrophy without stenosis.   L3-4: Disc bulging, prominent epidural fat, and moderate facet and ligamentum flavum hypertrophy result in mild spinal stenosis, mild bilateral lateral recess stenosis, and mild bilateral neural foraminal stenosis, unchanged.   L4-5: Disc bulging and moderate facet and ligamentum flavum hypertrophy result in borderline to mild bilateral lateral recess and left neural foraminal stenosis without spinal stenosis, unchanged.   L5-S1: Mild-to-moderate facet and ligamentum flavum hypertrophy without disc herniation or stenosis.   IMPRESSION: 1. Postoperative changes at L1-2 with decreased size of the posterior midline fluid collection. No evidence of recurrent tumor on this unenhanced study. 2. Unchanged mild spinal stenosis and mild bilateral neural foraminal stenosis at L3-4.     Electronically Signed   By: Aundra Lee M.D.   On: 02/13/2024 08:41  I have personally reviewed the images and agree with the above interpretation.  Above imaging reviewed with Dr. Mont Antis prior to her visit.   Assessment and Plan: [name] is s/p L1-L2 laminectomy for tumor resection (schwannoma) on 06/26/23. She then had complex repair of wound dehiscence on 08/14/23. She was doing well.    She had a fall at work on 12/29/23 and since that time she has constant lower back pain that gets worse as the day progresses.  No leg pain. No numbness, tingling, or weakness in her legs.   Thoracic MRI shows mild spondylosis. No compression fractures noted in thoracic or lumbar spine.   She has retrolisthesis L3-L4 with mild bilateral foraminal and central stenosis and mild central stenosis L4-L5 with mild left foraminal stenosis.   Fall likely aggravated her underlying lumbar spondylosis.    Treatment options discussed with patient and following plan made:    - Referral to PT for lumbar spine at [facility]. She is to call to schedule.  - Referral to  PMR at [facility] to discuss possible lumbar injections.  - She will continue to self limit at work- will try to avoid heavy lifting as much as she can. 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  - Follow up with me in 6-8 weeks for recheck.   Lucetta Russel PA-C Neurosurgery

## 2024-02-15 ENCOUNTER — Ambulatory Visit: Admitting: Orthopedic Surgery

## 2024-02-15 ENCOUNTER — Encounter: Payer: Self-pay | Admitting: Orthopedic Surgery

## 2024-02-15 DIAGNOSIS — M4316 Spondylolisthesis, lumbar region: Secondary | ICD-10-CM

## 2024-02-15 DIAGNOSIS — M48061 Spinal stenosis, lumbar region without neurogenic claudication: Secondary | ICD-10-CM | POA: Diagnosis not present

## 2024-02-15 DIAGNOSIS — W19XXXD Unspecified fall, subsequent encounter: Secondary | ICD-10-CM

## 2024-02-15 DIAGNOSIS — M4310 Spondylolisthesis, site unspecified: Secondary | ICD-10-CM

## 2024-02-15 DIAGNOSIS — M47814 Spondylosis without myelopathy or radiculopathy, thoracic region: Secondary | ICD-10-CM | POA: Diagnosis not present

## 2024-02-15 DIAGNOSIS — Z9889 Other specified postprocedural states: Secondary | ICD-10-CM

## 2024-02-15 DIAGNOSIS — M47816 Spondylosis without myelopathy or radiculopathy, lumbar region: Secondary | ICD-10-CM

## 2024-03-18 ENCOUNTER — Encounter: Payer: Self-pay | Admitting: Family Medicine

## 2024-03-18 ENCOUNTER — Ambulatory Visit: Payer: 59 | Admitting: Family Medicine

## 2024-03-18 VITALS — BP 123/83 | HR 88 | Ht 72.0 in | Wt 230.0 lb

## 2024-03-18 DIAGNOSIS — N181 Chronic kidney disease, stage 1: Secondary | ICD-10-CM

## 2024-03-18 DIAGNOSIS — B3731 Acute candidiasis of vulva and vagina: Secondary | ICD-10-CM | POA: Diagnosis not present

## 2024-03-18 DIAGNOSIS — E1122 Type 2 diabetes mellitus with diabetic chronic kidney disease: Secondary | ICD-10-CM

## 2024-03-18 DIAGNOSIS — Z7984 Long term (current) use of oral hypoglycemic drugs: Secondary | ICD-10-CM

## 2024-03-18 MED ORDER — METFORMIN HCL 500 MG PO TABS: ORAL_TABLET | ORAL | 1 refills | Status: DC

## 2024-03-18 NOTE — Progress Notes (Signed)
 BP 123/83 (BP Location: Left Arm, Patient Position: Sitting)   Pulse 88   Ht 6' (1.829 m)   Wt 230 lb (104.3 kg)   LMP 07/13/2020 (Approximate)   SpO2 98%   BMI 31.19 kg/m    Subjective:    Patient ID: [name], female    DOB: [birth year], 48 y.o.   MRN: [id number]  HPI: [name] is a 48 y.o. female  Chief Complaint  Patient presents with   Diabetes   DIABETES- terrible yeast infections weekly. Not feeling well Hypoglycemic episodes:no Polydipsia/polyuria: no Visual disturbance: no Chest pain: no Paresthesias: no Glucose Monitoring: yes  Accucheck frequency: Daily Taking Insulin ?: no Blood Pressure Monitoring: not checking Retinal Examination: Up to Date Foot Exam: Up to Date Diabetic Education: Completed Pneumovax: Up to Date Influenza: Up to Date Aspirin : no  Relevant past medical, surgical, family and social history reviewed and updated as indicated. Interim medical history since our last visit reviewed. Allergies and medications reviewed and updated.  Review of Systems  Constitutional: Negative.   Respiratory: Negative.    Cardiovascular: Negative.   Musculoskeletal:  Positive for back pain and myalgias. Negative for arthralgias, gait problem, joint swelling, neck pain and neck stiffness.  Skin: Negative.   Neurological: Negative.   Psychiatric/Behavioral: Negative.      Per HPI unless specifically indicated above     Objective:     BP 123/83 (BP Location: Left Arm, Patient Position: Sitting)   Pulse 88   Ht 6' (1.829 m)   Wt 230 lb (104.3 kg)   LMP 07/13/2020 (Approximate)   SpO2 98%   BMI 31.19 kg/m   Wt Readings from Last 3 Encounters:  03/18/24 230 lb (104.3 kg)  01/31/24 241 lb (109.3 kg)  12/12/23 229 lb (103.9 kg)    Physical Exam Vitals and nursing note reviewed.  Constitutional:      General: She is not in acute distress.    Appearance: Normal appearance. She is obese. She is not ill-appearing,  toxic-appearing or diaphoretic.  HENT:     Head: Normocephalic and atraumatic.     Right Ear: External ear normal.     Left Ear: External ear normal.     Nose: Nose normal.     Mouth/Throat:     Mouth: Mucous membranes are moist.     Pharynx: Oropharynx is clear.  Eyes:     General: No scleral icterus.       Right eye: No discharge.        Left eye: No discharge.     Extraocular Movements: Extraocular movements intact.     Conjunctiva/sclera: Conjunctivae normal.     Pupils: Pupils are equal, round, and reactive to light.  Cardiovascular:     Rate and Rhythm: Normal rate and regular rhythm.     Pulses: Normal pulses.     Heart sounds: Normal heart sounds. No murmur heard.    No friction rub. No gallop.  Pulmonary:     Effort: Pulmonary effort is normal. No respiratory distress.     Breath sounds: Normal breath sounds. No stridor. No wheezing, rhonchi or rales.  Chest:     Chest wall: No tenderness.  Musculoskeletal:        General: Normal range of motion.     Cervical back: Normal range of motion and neck supple.  Skin:    General: Skin is warm and dry.     Capillary Refill: Capillary refill takes less than 2 seconds.  Coloration: Skin is not jaundiced or pale.     Findings: No bruising, erythema, lesion or rash.  Neurological:     General: No focal deficit present.     Mental Status: She is alert and oriented to person, place, and time. Mental status is at baseline.  Psychiatric:        Mood and Affect: Mood normal.        Behavior: Behavior normal.        Thought Content: Thought content normal.        Judgment: Judgment normal.     Results for orders placed or performed in visit on 12/12/23  Rapid Strep screen([facility])   Collection Time: 12/12/23  3:59 PM   Specimen: Nasopharyngeal Swab; Other   Other  Result Value Ref Range   Strep Gp A Ag, IA W/Reflex Negative Negative  Culture, Group A Strep   Collection Time: 12/12/23  3:59 PM   Other  Result  Value Ref Range   Strep A Culture Negative   Bayer DCA Hb A1c Waived   Collection Time: 12/12/23  4:00 PM  Result Value Ref Range   HB A1C (BAYER DCA - WAIVED) 7.3 (H) 4.8 - 5.6 %  Microalbumin, Urine Waived   Collection Time: 12/12/23  4:00 PM  Result Value Ref Range   Microalb, Ur Waived 30 (H) 0 - 19 mg/L   Creatinine, Urine Waived 100 10 - 300 mg/dL   Microalb/Creat Ratio <30 <30 mg/g  CBC with Differential/Platelet   Collection Time: 12/12/23  4:03 PM  Result Value Ref Range   WBC 8.1 3.4 - 10.8 x10E3/uL   RBC 5.93 (H) 3.77 - 5.28 x10E6/uL   Hemoglobin 15.5 11.1 - 15.9 g/dL   Hematocrit 82.9 (H) 56.2 - 46.6 %   MCV 80 79 - 97 fL   MCH 26.1 (L) 26.6 - 33.0 pg   MCHC 32.5 31.5 - 35.7 g/dL   RDW 13.0 86.5 - 78.4 %   Platelets 245 150 - 450 x10E3/uL   Neutrophils 44 Not Estab. %   Lymphs 42 Not Estab. %   Monocytes 8 Not Estab. %   Eos 4 Not Estab. %   Basos 1 Not Estab. %   Neutrophils Absolute 3.6 1.4 - 7.0 x10E3/uL   Lymphocytes Absolute 3.4 (H) 0.7 - 3.1 x10E3/uL   Monocytes Absolute 0.7 0.1 - 0.9 x10E3/uL   EOS (ABSOLUTE) 0.3 0.0 - 0.4 x10E3/uL   Basophils Absolute 0.1 0.0 - 0.2 x10E3/uL   Immature Granulocytes 1 Not Estab. %   Immature Grans (Abs) 0.0 0.0 - 0.1 x10E3/uL  Comprehensive metabolic panel   Collection Time: 12/12/23  4:03 PM  Result Value Ref Range   Glucose 146 (H) 70 - 99 mg/dL   BUN 24 6 - 24 mg/dL   Creatinine, Ser 6.96 0.57 - 1.00 mg/dL   eGFR 95 >29 BM/WUX/3.24   BUN/Creatinine Ratio 31 (H) 9 - 23   Sodium 138 134 - 144 mmol/L   Potassium 4.3 3.5 - 5.2 mmol/L   Chloride 100 96 - 106 mmol/L   CO2 19 (L) 20 - 29 mmol/L   Calcium  10.6 (H) 8.7 - 10.2 mg/dL   Total Protein 7.6 6.0 - 8.5 g/dL   Albumin 5.0 (H) 3.9 - 4.9 g/dL   Globulin, Total 2.6 1.5 - 4.5 g/dL   Bilirubin Total 0.3 0.0 - 1.2 mg/dL   Alkaline Phosphatase 101 44 - 121 IU/L   AST 26 0 - 40 IU/L   ALT  32 0 - 32 IU/L  Lipid Panel w/o Chol/HDL Ratio   Collection Time: 12/12/23   4:03 PM  Result Value Ref Range   Cholesterol, Total 174 100 - 199 mg/dL   Triglycerides 696 (H) 0 - 149 mg/dL   HDL 32 (L) >29 mg/dL   VLDL Cholesterol Cal 61 (H) 5 - 40 mg/dL   LDL Chol Calc (NIH) 81 0 - 99 mg/dL  TSH   Collection Time: 12/12/23  4:03 PM  Result Value Ref Range   TSH 1.480 0.450 - 4.500 uIU/mL  WET PREP FOR TRICH, YEAST, CLUE   Collection Time: 12/12/23  4:18 PM   Specimen: Sterile Swab   Sterile Swab  Result Value Ref Range   Trichomonas Exam Negative Negative   Yeast Exam Positive (A) Negative   Clue Cell Exam Negative Negative  Hepatitis C Antibody   Collection Time: 12/12/23  4:46 PM  Result Value Ref Range   Hep C Virus Ab Non Reactive Non Reactive      Assessment & Plan:   Problem List Items Addressed This Visit       Endocrine   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 Having recurrent yeast infections. Will stop jardiance  and retry metformin - previously had horrible diarrhea on higher doses. Will monitor closely. Recheck 3 months. Call with any concerns.       Relevant Medications   metFORMIN  (GLUCOPHAGE ) 500 MG tablet   Other Relevant Orders   Bayer DCA Hb A1c Waived   Other Visit Diagnoses       Yeast vaginitis       Will stop jardiance .        Follow up plan: Return in about 3 months (around 06/18/2024).

## 2024-03-18 NOTE — Assessment & Plan Note (Signed)
 Up again with A1c of 7.6 up from 7.3. Having recurrent yeast infections. Will stop jardiance  and retry metformin - previously had horrible diarrhea on higher doses. Will monitor closely. Recheck 3 months. Call with any concerns.

## 2024-03-19 LAB — BAYER DCA HB A1C WAIVED: HB A1C (BAYER DCA - WAIVED): 7.6 % — ABNORMAL HIGH (ref 4.8–5.6)

## 2024-03-31 ENCOUNTER — Other Ambulatory Visit: Payer: Self-pay | Admitting: Family Medicine

## 2024-04-02 NOTE — Telephone Encounter (Signed)
 Requested Prescriptions  Pending Prescriptions Disp Refills   naproxen  (NAPROSYN ) 500 MG tablet [Pharmacy Med Name: NAPROXEN  500MG  TABLETS] 180 tablet 1    Sig: TAKE 1 TABLET(500 MG) BY MOUTH TWICE DAILY WITH A MEAL     Analgesics:  NSAIDS Failed - 04/02/2024 12:30 PM      Failed - Manual Review: Labs are only required if the patient has taken medication for more than 8 weeks.      Failed - HCT in normal range and within 360 days    Hematocrit  Date Value Ref Range Status  12/12/2023 47.7 (H) 34.0 - 46.6 % Final         Passed - Cr in normal range and within 360 days    Creatinine  Date Value Ref Range Status  04/09/2014 0.89 0.60 - 1.30 mg/dL Final   Creatinine, Ser  Date Value Ref Range Status  12/12/2023 0.78 0.57 - 1.00 mg/dL Final         Passed - HGB in normal range and within 360 days    Hemoglobin  Date Value Ref Range Status  12/12/2023 15.5 11.1 - 15.9 g/dL Final         Passed - PLT in normal range and within 360 days    Platelets  Date Value Ref Range Status  12/12/2023 245 150 - 450 x10E3/uL Final         Passed - eGFR is 30 or above and within 360 days    EGFR (African American)  Date Value Ref Range Status  04/09/2014 >60  Final   GFR calc Af Amer  Date Value Ref Range Status  11/26/2020 104 >59 mL/min/1.73 Final    Comment:    **In accordance with recommendations from the NKF-ASN Task force,**   [facility] is in the process of updating its eGFR calculation to the   2021 CKD-EPI creatinine equation that estimates kidney function   without a race variable.    EGFR (Non-African Amer.)  Date Value Ref Range Status  04/09/2014 >60  Final    Comment:    eGFR values <6mL/min/1.73 m2 may be an indication of chronic kidney disease (CKD). Calculated eGFR is useful in patients with stable renal function. The eGFR calculation will not be reliable in acutely ill patients when serum creatinine is changing rapidly. It is not useful in  patients on dialysis.  The eGFR calculation may not be applicable to patients at the low and high extremes of body sizes, pregnant women, and vegetarians.    GFR, Estimated  Date Value Ref Range Status  07/21/2023 >60 >60 mL/min Final    Comment:    (NOTE) Calculated using the CKD-EPI Creatinine Equation (2021)    eGFR  Date Value Ref Range Status  12/12/2023 95 >59 mL/min/1.73 Final         Passed - Patient is not pregnant      Passed - Valid encounter within last 12 months    Recent Outpatient Visits           2 weeks ago 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, without long-term current use of insulin  (HCC)   [place] [facility] [place], Megan P, DO   3 months ago Routine general medical examination at a health care facility   [facility], [place] P, DO               TRELEGY ELLIPTA  100-62.5-25 MCG/ACT [place] Doing Med Name:  TRELEGY ELLIPTA  100-62. INH 30P] 60 each 12    Sig: INHALE 1 PUFF INTO THE LUNGS DAILY     Off-Protocol Failed - 04/02/2024 12:30 PM      Failed - Medication not assigned to a protocol, review manually.      Passed - Valid encounter within last 12 months    Recent Outpatient Visits           2 weeks ago 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, without long-term current use of insulin  (HCC)   [facility] [place], Megan P, DO   3 months ago Routine general medical examination at a health care facility   [facility], [place] P, DO               montelukast  (SINGULAIR ) 10 MG tablet [Pharmacy Med Name: MONTELUKAST  10MG  TABLETS] 90 tablet 1    Sig: TAKE 1 TABLET(10 MG) BY MOUTH AT BEDTIME     Pulmonology:  Leukotriene Inhibitors Passed - 04/02/2024 12:30 PM      Passed - Valid encounter within last 12 months    Recent Outpatient Visits           2 weeks ago Controlled type 2 diabetes  mellitus with stage 1 chronic kidney disease, without long-term current use of insulin  (HCC)   [place] [facility] [place], Megan P, DO   3 months ago Routine general medical examination at a health care facility   [facility], Megan P, DO
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
## 2024-04-16 ENCOUNTER — Ambulatory Visit: Admitting: Orthopedic Surgery

## 2024-04-27 ENCOUNTER — Other Ambulatory Visit: Payer: Self-pay | Admitting: Family Medicine

## 2024-04-29 NOTE — Telephone Encounter (Signed)
 Requested Prescriptions  Pending Prescriptions Disp Refills   pantoprazole  (PROTONIX ) 40 MG tablet [Pharmacy Med Name: PANTOPRAZOLE  40MG  TABLETS] 180 tablet 1    Sig: TAKE 1 TABLET(40 MG) BY MOUTH TWICE DAILY     Gastroenterology: Proton Pump Inhibitors Passed - 04/29/2024  2:59 PM      Passed - Valid encounter within last 12 months    Recent Outpatient Visits           1 month ago 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, without long-term current use of insulin  (HCC)   [place] [facility] [place], Megan P, DO   4 months ago Routine general medical examination at a health care facility   [facility], Megan P, DO               lisinopril  (ZESTRIL ) 5 MG tablet [Pharmacy Med Name: LISINOPRIL  5MG  TABLETS] 90 tablet 1    Sig: TAKE 1 TABLET(5 MG) BY MOUTH DAILY     Cardiovascular:  ACE Inhibitors Passed - 04/29/2024  2:59 PM      Passed - Cr in normal range and within 180 days    Creatinine  Date Value Ref Range Status  04/09/2014 0.89 0.60 - 1.30 mg/dL Final   Creatinine, Ser  Date Value Ref Range Status  12/12/2023 0.78 0.57 - 1.00 mg/dL Final         Passed - K in normal range and within 180 days    Potassium  Date Value Ref Range Status  12/12/2023 4.3 3.5 - 5.2 mmol/L Final  04/09/2014 4.2 3.5 - 5.1 mmol/L Final         Passed - Patient is not pregnant      Passed - Last BP in normal range    BP Readings from Last 1 Encounters:  03/18/24 123/83         Passed - Valid encounter within last 6 months    Recent Outpatient Visits           1 month ago 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, without long-term current use of insulin  (HCC)   [place] [facility] [place], Megan P, DO   4 months ago Routine general medical examination at a health care facility   [facility] [place], [place], DO

## 2024-06-18 ENCOUNTER — Ambulatory Visit: Admitting: Family Medicine

## 2024-06-21 ENCOUNTER — Encounter: Payer: Self-pay | Admitting: Family Medicine

## 2024-06-21 ENCOUNTER — Other Ambulatory Visit: Payer: Self-pay

## 2024-06-21 ENCOUNTER — Ambulatory Visit: Admitting: Family Medicine

## 2024-06-21 VITALS — BP 138/78 | HR 88 | Ht 72.0 in | Wt 252.6 lb

## 2024-06-21 DIAGNOSIS — R42 Dizziness and giddiness: Secondary | ICD-10-CM | POA: Diagnosis not present

## 2024-06-21 DIAGNOSIS — T466X5A Adverse effect of antihyperlipidemic and antiarteriosclerotic drugs, initial encounter: Secondary | ICD-10-CM

## 2024-06-21 DIAGNOSIS — Z23 Encounter for immunization: Secondary | ICD-10-CM | POA: Diagnosis not present

## 2024-06-21 DIAGNOSIS — Z789 Other specified health status: Secondary | ICD-10-CM

## 2024-06-21 DIAGNOSIS — I129 Hypertensive chronic kidney disease with stage 1 through stage 4 chronic kidney disease, or unspecified chronic kidney disease: Secondary | ICD-10-CM | POA: Diagnosis not present

## 2024-06-21 DIAGNOSIS — E1122 Type 2 diabetes mellitus with diabetic chronic kidney disease: Secondary | ICD-10-CM

## 2024-06-21 DIAGNOSIS — N181 Chronic kidney disease, stage 1: Secondary | ICD-10-CM

## 2024-06-21 DIAGNOSIS — Z7985 Long-term (current) use of injectable non-insulin antidiabetic drugs: Secondary | ICD-10-CM

## 2024-06-21 DIAGNOSIS — G720 Drug-induced myopathy: Secondary | ICD-10-CM

## 2024-06-21 LAB — BAYER DCA HB A1C WAIVED: HB A1C (BAYER DCA - WAIVED): 8 % — ABNORMAL HIGH (ref 4.8–5.6)

## 2024-06-21 MED ORDER — ALBUTEROL SULFATE HFA 108 (90 BASE) MCG/ACT IN AERS: 2.0000 | INHALATION_SPRAY | Freq: Four times a day (QID) | RESPIRATORY_TRACT | 6 refills | Status: AC | PRN

## 2024-06-21 MED ORDER — CYCLOBENZAPRINE HCL 10 MG PO TABS: 10.0000 mg | ORAL_TABLET | Freq: Every day | ORAL | 1 refills | Status: DC

## 2024-06-21 MED ORDER — MONTELUKAST SODIUM 10 MG PO TABS: ORAL_TABLET | ORAL | 1 refills | Status: AC

## 2024-06-21 MED ORDER — BUPROPION HCL ER (SR) 150 MG PO TB12: 150.0000 mg | ORAL_TABLET | Freq: Two times a day (BID) | ORAL | 1 refills | Status: DC

## 2024-06-21 MED ORDER — METFORMIN HCL 500 MG PO TABS: 1000.0000 mg | ORAL_TABLET | Freq: Two times a day (BID) | ORAL | 1 refills | Status: AC

## 2024-06-21 MED ORDER — TRAZODONE HCL 150 MG PO TABS: 150.0000 mg | ORAL_TABLET | Freq: Every day | ORAL | 1 refills | Status: AC

## 2024-06-21 MED ORDER — LISINOPRIL 5 MG PO TABS: 5.0000 mg | ORAL_TABLET | Freq: Every day | ORAL | 1 refills | Status: DC

## 2024-06-21 MED ORDER — PANTOPRAZOLE SODIUM 40 MG PO TBEC: 40.0000 mg | DELAYED_RELEASE_TABLET | Freq: Two times a day (BID) | ORAL | 1 refills | Status: AC

## 2024-06-21 MED ORDER — CETIRIZINE HCL 10 MG PO TABS: ORAL_TABLET | ORAL | 3 refills | Status: AC

## 2024-06-21 MED ORDER — RYBELSUS 7 MG PO TABS
7.0000 mg | ORAL_TABLET | Freq: Every day | ORAL | 1 refills | Status: DC
  Filled 2024-06-21: qty 30, 30d supply, fill #0
  Filled 2024-07-14: qty 90, 90d supply, fill #0

## 2024-06-21 MED ORDER — METOPROLOL SUCCINATE ER 25 MG PO TB24: ORAL_TABLET | ORAL | 1 refills | Status: AC

## 2024-06-21 MED ORDER — TRELEGY ELLIPTA 100-62.5-25 MCG/ACT IN AEPB: 1.0000 | INHALATION_SPRAY | Freq: Every day | RESPIRATORY_TRACT | 2 refills | Status: AC

## 2024-06-21 NOTE — Assessment & Plan Note (Signed)
 Up to 8.0 from 7.6- recently stopped jardiance  and was titrated up on metformin . Tolerating meds well now. Will recheck A1c in about 6 weeks- if we need to add something we will. Continue to monitor.

## 2024-06-21 NOTE — Assessment & Plan Note (Signed)
 Unable to tolerate statins. Continue to monitor.

## 2024-06-21 NOTE — Progress Notes (Signed)
 BP 138/78   Pulse 88   Ht 6' (1.829 m)   Wt 252 lb 9.6 oz (114.6 kg)   LMP 07/13/2020 (Approximate)   SpO2 96%   BMI 34.26 kg/m    Subjective:    Patient ID: [name], female    DOB: Nov 06, 1975, 48 y.o.   MRN: [id number]  HPI: [name] is a 48 y.o. female  Chief Complaint  Patient presents with   Dizziness   DIABETES- has been on the 1000mg  BID of the metformin  for about a month- 1.5 months Hypoglycemic episodes:no Polydipsia/polyuria: no Visual disturbance: no Chest pain: no Paresthesias: no Glucose Monitoring: no Taking Insulin ?: no Blood Pressure Monitoring: rarely Retinal Examination: Up to Date Foot Exam: Up to Date Diabetic Education: Completed Pneumovax: Up to Date Influenza: Up to Date Aspirin : no  HYPERTENSION / HYPERLIPIDEMIA Satisfied with current treatment? yes Duration of hypertension: chronic BP monitoring frequency: not checking BP medication side effects: no Past BP meds: lisinopril  Duration of hyperlipidemia: chronic Cholesterol medication side effects: yes Cholesterol supplements: none Past cholesterol medications: none Medication compliance: excellent compliance Aspirin : no Recent stressors: yes Recurrent headaches: no Visual changes: no Palpitations: no Dyspnea: no Chest pain: no Lower extremity edema: no Dizzy/lightheaded: yes  Relevant past medical, surgical, family and social history reviewed and updated as indicated. Interim medical history since our last visit reviewed. Allergies and medications reviewed and updated.  Review of Systems  Constitutional: Negative.   Respiratory: Negative.    Cardiovascular: Negative.   Musculoskeletal:  Positive for back pain and myalgias. Negative for arthralgias, gait problem, joint swelling, neck pain and neck stiffness.  Skin: Negative.   Psychiatric/Behavioral: Negative.      Per HPI unless specifically indicated above     Objective:    BP 138/78    Pulse 88   Ht 6' (1.829 m)   Wt 252 lb 9.6 oz (114.6 kg)   LMP 07/13/2020 (Approximate)   SpO2 96%   BMI 34.26 kg/m   Wt Readings from Last 3 Encounters:  06/21/24 252 lb 9.6 oz (114.6 kg)  03/18/24 230 lb (104.3 kg)  01/31/24 241 lb (109.3 kg)    Physical Exam Vitals and nursing note reviewed.  Constitutional:      General: She is not in acute distress.    Appearance: Normal appearance. She is obese. She is not ill-appearing, toxic-appearing or diaphoretic.  HENT:     Head: Normocephalic and atraumatic.     Right Ear: Tympanic membrane, ear canal and external ear normal.     Left Ear: Tympanic membrane, ear canal and external ear normal.     Nose: Nose normal. No congestion or rhinorrhea.     Mouth/Throat:     Mouth: Mucous membranes are moist.     Pharynx: Oropharynx is clear.  Eyes:     General: No scleral icterus.       Right eye: No discharge.        Left eye: No discharge.     Extraocular Movements: Extraocular movements intact.     Right eye: Nystagmus present. Normal extraocular motion.     Left eye: Normal extraocular motion and no nystagmus.     Conjunctiva/sclera: Conjunctivae normal.     Pupils: Pupils are equal, round, and reactive to light.  Cardiovascular:     Rate and Rhythm: Normal rate and regular rhythm.     Pulses: Normal pulses.     Heart sounds: Normal heart sounds. No murmur heard.  No friction rub. No gallop.  Pulmonary:     Effort: Pulmonary effort is normal. No respiratory distress.     Breath sounds: Normal breath sounds. No stridor. No wheezing, rhonchi or rales.  Chest:     Chest wall: No tenderness.  Musculoskeletal:        General: Normal range of motion.     Cervical back: Normal range of motion and neck supple.  Skin:    General: Skin is warm and dry.     Capillary Refill: Capillary refill takes less than 2 seconds.     Coloration: Skin is not jaundiced or pale.     Findings: No bruising, erythema, lesion or rash.   Neurological:     General: No focal deficit present.     Mental Status: She is alert and oriented to person, place, and time. Mental status is at baseline.  Psychiatric:        Mood and Affect: Mood normal.        Behavior: Behavior normal.        Thought Content: Thought content normal.        Judgment: Judgment normal.     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 Tolerating meds well now. Will recheck A1c in about 6 weeks- if we need to add something we will. Continue to monitor.       Relevant Medications   Semaglutide  (RYBELSUS ) 7 MG TABS   lisinopril  (ZESTRIL ) 5 MG tablet   metFORMIN  (GLUCOPHAGE ) 500 MG tablet   Other Relevant Orders   Bayer DCA Hb A1c Waived   CBC with Differential/Platelet   Lipid Panel w/o Chol/HDL Ratio   Comprehensive metabolic panel with GFR     Musculoskeletal and Integument   Statin myopathy   Unable to tolerate statins. Continue to monitor.         Genitourinary   Benign hypertensive renal disease   Under good control on current regimen. Continue current regimen. Continue to monitor. Call with any concerns. Refills given. Labs drawn today.        Relevant Orders   Bayer DCA Hb A1c Waived   CBC with Differential/Platelet   Lipid Panel w/o Chol/HDL Ratio   Comprehensive metabolic panel with GFR   Other Visit Diagnoses       Vertigo       Will treat with epley's manuver. Call with any concerns or if not getting better.     Hepatitis B vaccination status unknown       Labs drawn today. Await results.   Relevant Orders   Hepatitis B  surface antibody,quantitative     Needs flu shot       Flu shot given today.   Relevant Orders   Flu vaccine trivalent PF, 6mos and older(Flulaval,Afluria,Fluarix,Fluzone ) (Completed)        Follow up plan: Return in about 6 weeks (around 08/02/2024).

## 2024-06-21 NOTE — Assessment & Plan Note (Signed)
 Under good control on current regimen. Continue current regimen. Continue to monitor. Call with any concerns. Refills given. Labs drawn today.

## 2024-06-22 LAB — LIPID PANEL W/O CHOL/HDL RATIO

## 2024-06-24 ENCOUNTER — Ambulatory Visit: Payer: Self-pay | Admitting: Family Medicine

## 2024-06-24 LAB — CBC WITH DIFFERENTIAL/PLATELET
Basophils Absolute: 0 x10E3/uL (ref 0.0–0.2)
Basos: 1 %
EOS (ABSOLUTE): 0.2 x10E3/uL (ref 0.0–0.4)
Eos: 2 %
Hematocrit: 39.3 % (ref 34.0–46.6)
Hemoglobin: 12.4 g/dL (ref 11.1–15.9)
Immature Grans (Abs): 0 x10E3/uL (ref 0.0–0.1)
Immature Granulocytes: 0 %
Lymphocytes Absolute: 2.9 x10E3/uL (ref 0.7–3.1)
Lymphs: 37 %
MCH: 26.1 pg — ABNORMAL LOW (ref 26.6–33.0)
MCHC: 31.6 g/dL (ref 31.5–35.7)
MCV: 83 fL (ref 79–97)
Monocytes Absolute: 0.5 x10E3/uL (ref 0.1–0.9)
Monocytes: 7 %
Neutrophils Absolute: 4.1 x10E3/uL (ref 1.4–7.0)
Neutrophils: 52 %
Platelets: 185 x10E3/uL (ref 150–450)
RBC: 4.76 x10E6/uL (ref 3.77–5.28)
RDW: 15.4 % (ref 11.7–15.4)
WBC: 7.8 x10E3/uL (ref 3.4–10.8)

## 2024-06-24 LAB — COMPREHENSIVE METABOLIC PANEL WITH GFR
ALT: 40 IU/L — AB (ref 0–32)
AST: 26 IU/L (ref 0–40)
Albumin: 4.3 g/dL (ref 3.9–4.9)
Alkaline Phosphatase: 81 IU/L (ref 44–121)
BUN/Creatinine Ratio: 21 (ref 9–23)
BUN: 15 mg/dL (ref 6–24)
Bilirubin Total: <0.2 mg/dL (ref 0.0–1.2)
CO2: 21 mmol/L (ref 20–29)
Calcium: 9.5 mg/dL (ref 8.7–10.2)
Chloride: 103 mmol/L (ref 96–106)
Creatinine, Ser: 0.71 mg/dL (ref 0.57–1.00)
Globulin, Total: 2 g/dL (ref 1.5–4.5)
Glucose: 209 mg/dL — AB (ref 70–99)
Potassium: 4 mmol/L (ref 3.5–5.2)
Sodium: 138 mmol/L (ref 134–144)
Total Protein: 6.3 g/dL (ref 6.0–8.5)
eGFR: 106 mL/min/1.73 (ref >59)

## 2024-06-24 LAB — LIPID PANEL W/O CHOL/HDL RATIO
Cholesterol, Total: 140 mg/dL (ref 100–199)
HDL: 23 mg/dL — AB (ref >39)
LDL Chol Calc (NIH): 52 mg/dL (ref 0–99)
Triglycerides: 433 mg/dL — AB (ref 0–149)
VLDL Cholesterol Cal: 65 mg/dL — AB (ref 5–40)

## 2024-06-24 LAB — HEPATITIS B SURFACE ANTIBODY, QUANTITATIVE: Hepatitis B Surf Ab Quant: <3.5 m[IU]/mL — AB

## 2024-06-27 ENCOUNTER — Encounter: Payer: Self-pay | Admitting: Family Medicine

## 2024-07-02 ENCOUNTER — Ambulatory Visit (INDEPENDENT_AMBULATORY_CARE_PROVIDER_SITE_OTHER)

## 2024-07-02 DIAGNOSIS — Z23 Encounter for immunization: Secondary | ICD-10-CM

## 2024-07-02 NOTE — Progress Notes (Signed)
 Patient is in office today for a nurse visit for Hep B. Patient Injection was given in the  Left deltoid. Patient tolerated injection well.  Patient received first dose.

## 2024-07-07 ENCOUNTER — Encounter: Payer: Self-pay | Admitting: Family Medicine

## 2024-07-12 ENCOUNTER — Encounter: Payer: Self-pay | Admitting: Family Medicine

## 2024-07-14 ENCOUNTER — Other Ambulatory Visit: Payer: Self-pay | Admitting: Family Medicine

## 2024-07-14 ENCOUNTER — Other Ambulatory Visit: Payer: Self-pay

## 2024-07-15 MED ORDER — RYBELSUS 14 MG PO TABS
14.0000 mg | ORAL_TABLET | Freq: Every day | ORAL | 0 refills | Status: DC
  Filled 2024-07-15: qty 90, 90d supply, fill #0

## 2024-07-16 ENCOUNTER — Other Ambulatory Visit: Payer: Self-pay

## 2024-07-16 NOTE — Telephone Encounter (Signed)
 Requested medication (s) are due for refill today: no  Requested medication (s) are on the active medication list: yes  Last refill:  06/21/24 #90 1 RF  Future visit scheduled: no  Notes to clinic:  med is off protocol/pt wanting med sent to local pharmacy   Requested Prescriptions  Pending Prescriptions Disp Refills   RYBELSUS  7 MG TABS [Pharmacy Med Name: RYBELSUS  7MG  TABLETS] 90 tablet 1    Sig: TAKE 1 TABLET(7 MG) BY MOUTH DAILY     Off-Protocol Failed - 07/16/2024  3:12 PM      Failed - Medication not assigned to a protocol, review manually.      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

## 2024-08-06 ENCOUNTER — Encounter: Payer: Self-pay | Admitting: Family Medicine

## 2024-08-06 ENCOUNTER — Telehealth

## 2024-08-06 ENCOUNTER — Ambulatory Visit: Admitting: Family Medicine

## 2024-08-06 VITALS — BP 143/85 | HR 93 | Temp 97.8°F | Ht 72.0 in | Wt 248.6 lb

## 2024-08-06 DIAGNOSIS — K122 Cellulitis and abscess of mouth: Secondary | ICD-10-CM

## 2024-08-06 DIAGNOSIS — J069 Acute upper respiratory infection, unspecified: Secondary | ICD-10-CM

## 2024-08-06 LAB — VERITOR FLU A/B WAIVED
Influenza A: NEGATIVE
Influenza B: NEGATIVE

## 2024-08-06 MED ORDER — TRIAMCINOLONE ACETONIDE 40 MG/ML IJ SUSP: 40.0000 mg | Freq: Once | INTRAMUSCULAR | Status: DC

## 2024-08-06 MED ORDER — PREDNISONE 10 MG PO TABS
ORAL_TABLET | ORAL | 0 refills | Status: DC
Start: 2024-08-06 — End: 2024-08-12

## 2024-08-06 MED ORDER — METHYLPREDNISOLONE SODIUM SUCC 40 MG IJ SOLR
40.0000 mg | Freq: Once | INTRAMUSCULAR | Status: AC
  Administered 2024-08-06: 40 mg via INTRAMUSCULAR

## 2024-08-06 NOTE — Progress Notes (Signed)
 BP (!) 143/85   Pulse 93   Temp 97.8 F (36.6 C) (Oral)   Ht 6' (1.829 m)   Wt 248 lb 9.6 oz (112.8 kg)   LMP 07/13/2020 (Approximate)   SpO2 97%   BMI 33.72 kg/m    Subjective:    Patient ID: [name], female    DOB: 29-Aug-1976, 48 y.o.   MRN: [id number]  HPI: [name] is a 48 y.o. female  Chief Complaint  Patient presents with   Sore Throat   Nasal Congestion   Fatigue   Cough   Ear Pain   Chills   UPPER RESPIRATORY TRACT INFECTION Duration: 1 day Worst symptom: SOB, sore throat Fever: no Cough: yes Shortness of breath: yes Wheezing: no Chest pain: no Chest tightness: yes Chest congestion: yes Nasal congestion: yes Runny nose: yes Post nasal drip: yes Sneezing: yes Sore throat: yes Swollen glands: yes Sinus pressure: yes Headache: yes Face pain: yes Toothache: no Ear pain: yes bilateral Ear pressure: yes bilateral Eyes red/itching:no Eye drainage/crusting: no  Vomiting: no Rash: no Fatigue: yes Sick contacts: yes Strep contacts: yes  Context: worse Recurrent sinusitis: no Relief with OTC cold/cough medications: no  Treatments attempted: mucinex     Relevant past medical, surgical, family and social history reviewed and updated as indicated. Interim medical history since our last visit reviewed. Allergies and medications reviewed and updated.  Review of Systems  Constitutional: Negative.   HENT:  Positive for congestion, postnasal drip, rhinorrhea, sinus pressure, sinus pain, sneezing and sore throat. Negative for dental problem, drooling, ear discharge, ear pain, facial swelling, hearing loss, mouth sores, nosebleeds, tinnitus, trouble swallowing and voice change.   Eyes: Negative.   Respiratory:  Positive for shortness of breath. Negative for apnea, cough, choking, chest tightness, wheezing and stridor.   Cardiovascular: Negative.   Gastrointestinal: Negative.   Neurological: Negative.    Psychiatric/Behavioral: Negative.      Per HPI unless specifically indicated above     Objective:    BP (!) 143/85   Pulse 93   Temp 97.8 F (36.6 C) (Oral)   Ht 6' (1.829 m)   Wt 248 lb 9.6 oz (112.8 kg)   LMP 07/13/2020 (Approximate)   SpO2 97%   BMI 33.72 kg/m   Wt Readings from Last 3 Encounters:  08/06/24 248 lb 9.6 oz (112.8 kg)  06/21/24 252 lb 9.6 oz (114.6 kg)  03/18/24 230 lb (104.3 kg)    Physical Exam Vitals and nursing note reviewed.  Constitutional:      General: She is not in acute distress.    Appearance: Normal appearance. She is well-developed. She is obese. She is not ill-appearing, toxic-appearing or diaphoretic.  HENT:     Head: Normocephalic and atraumatic.     Right Ear: Ear canal and external ear normal. No drainage, swelling or tenderness. A middle ear effusion is present. Tympanic membrane is not erythematous.     Left Ear: Tympanic membrane, ear canal and external ear normal. No drainage, swelling or tenderness.  No middle ear effusion. Tympanic membrane is not erythematous.     Nose: Congestion present. No rhinorrhea.     Mouth/Throat:     Mouth: Mucous membranes are moist. No oral lesions.     Pharynx: Oropharynx is clear. Uvula swelling present. No pharyngeal swelling, oropharyngeal exudate or posterior oropharyngeal erythema.     Tonsils: No tonsillar exudate or tonsillar abscesses.  Eyes:     General: No scleral icterus.  Right eye: No discharge.        Left eye: No discharge.     Extraocular Movements: Extraocular movements intact.     Conjunctiva/sclera: Conjunctivae normal.     Pupils: Pupils are equal, round, and reactive to light.  Cardiovascular:     Rate and Rhythm: Normal rate and regular rhythm.     Pulses: Normal pulses.     Heart sounds: Normal heart sounds. No murmur heard.    No friction rub. No gallop.  Pulmonary:     Effort: Pulmonary effort is normal. No respiratory distress.     Breath sounds: Normal breath  sounds. No stridor. No wheezing, rhonchi or rales.  Chest:     Chest wall: No tenderness.  Musculoskeletal:        General: Normal range of motion.     Cervical back: Normal range of motion and neck supple.  Skin:    General: Skin is warm and dry.     Capillary Refill: Capillary refill takes less than 2 seconds.     Coloration: Skin is not jaundiced or pale.     Findings: No bruising, erythema, lesion or rash.  Neurological:     General: No focal deficit present.     Mental Status: She is alert and oriented to person, place, and time. Mental status is at baseline.  Psychiatric:        Mood and Affect: Mood normal.        Behavior: Behavior normal.        Thought Content: Thought content normal.        Judgment: Judgment normal.     Results for orders placed or performed in visit on 06/21/24  Bayer DCA Hb A1c Waived   Collection Time: 06/21/24  2:18 PM  Result Value Ref Range   HB A1C (BAYER DCA - WAIVED) 8.0 (H) 4.8 - 5.6 %  CBC with Differential/Platelet   Collection Time: 06/21/24  3:04 PM  Result Value Ref Range   WBC 7.8 3.4 - 10.8 x10E3/uL   RBC 4.76 3.77 - 5.28 x10E6/uL   Hemoglobin 12.4 11.1 - 15.9 g/dL   Hematocrit 60.6 65.9 - 46.6 %   MCV 83 79 - 97 fL   MCH 26.1 (L) 26.6 - 33.0 pg   MCHC 31.6 31.5 - 35.7 g/dL   RDW 84.5 88.2 - 84.5 %   Platelets 185 150 - 450 x10E3/uL   Neutrophils 52 Not Estab. %   Lymphs 37 Not Estab. %   Monocytes 7 Not Estab. %   Eos 2 Not Estab. %   Basos 1 Not Estab. %   Neutrophils Absolute 4.1 1.4 - 7.0 x10E3/uL   Lymphocytes Absolute 2.9 0.7 - 3.1 x10E3/uL   Monocytes Absolute 0.5 0.1 - 0.9 x10E3/uL   EOS (ABSOLUTE) 0.2 0.0 - 0.4 x10E3/uL   Basophils Absolute 0.0 0.0 - 0.2 x10E3/uL   Immature Granulocytes 0 Not Estab. %   Immature Grans (Abs) 0.0 0.0 - 0.1 x10E3/uL  Lipid Panel w/o Chol/HDL Ratio   Collection Time: 06/21/24  3:04 PM  Result Value Ref Range   Cholesterol, Total 140 100 - 199 mg/dL   Triglycerides 566 (H) 0 -  149 mg/dL   HDL 23 (L) >60 mg/dL   VLDL Cholesterol Cal 65 (H) 5 - 40 mg/dL   LDL Chol Calc (NIH) 52 0 - 99 mg/dL  Comprehensive metabolic panel with GFR   Collection Time: 06/21/24  3:04 PM  Result Value Ref Range  Glucose 209 (H) 70 - 99 mg/dL   BUN 15 6 - 24 mg/dL   Creatinine, Ser 9.28 0.57 - 1.00 mg/dL   eGFR 893 >40 fO/fpw/8.26   BUN/Creatinine Ratio 21 9 - 23   Sodium 138 134 - 144 mmol/L   Potassium 4.0 3.5 - 5.2 mmol/L   Chloride 103 96 - 106 mmol/L   CO2 21 20 - 29 mmol/L   Calcium  9.5 8.7 - 10.2 mg/dL   Total Protein 6.3 6.0 - 8.5 g/dL   Albumin 4.3 3.9 - 4.9 g/dL   Globulin, Total 2.0 1.5 - 4.5 g/dL   Bilirubin Total <9.7 0.0 - 1.2 mg/dL   Alkaline Phosphatase 81 44 - 121 IU/L   AST 26 0 - 40 IU/L   ALT 40 (H) 0 - 32 IU/L  Hepatitis B surface antibody,quantitative   Collection Time: 06/21/24  3:04 PM  Result Value Ref Range   Hepatitis B Surf Ab Quant <3.5 (L) Immunity>10 mIU/mL      Assessment & Plan:   Problem List Items Addressed This Visit   None Visit Diagnoses       Uvulitis    -  Primary   Will treat with steroids. Call if not significantly better by middle of the week and we'll send in amoxicillin . Continue to monitor.   Relevant Medications   methylPREDNISolone  sodium succinate (SOLU-MEDROL ) 40 mg/mL injection 40 mg     Upper respiratory tract infection, unspecified type       COVID, flu and strep tested for today. Flu and Strep negative. Await COVID. Treat symptomatically   Relevant Orders   Rapid Strep Screen ([facility] ONLY)   Veritor Flu A/B Waived   Novel Coronavirus, NAA ([facility])        Follow up plan: Return for As scheduled.

## 2024-08-07 ENCOUNTER — Telehealth: Payer: Self-pay | Admitting: Family Medicine

## 2024-08-07 ENCOUNTER — Other Ambulatory Visit: Payer: Self-pay | Admitting: Family Medicine

## 2024-08-07 MED ORDER — AMOXICILLIN-POT CLAVULANATE 875-125 MG PO TABS: 1.0000 | ORAL_TABLET | Freq: Two times a day (BID) | ORAL | 0 refills | Status: DC

## 2024-08-07 NOTE — Telephone Encounter (Signed)
Antibiotic sent to her pharmacy.

## 2024-08-07 NOTE — Telephone Encounter (Signed)
 Copied from CRM [id number]. Topic: Clinical - Medication Question >> Aug 07, 2024 10:54 AM Montie POUR wrote: Reason for CRM:  Dr. Vicci told [name] to call back if she is not feeling better and Dr. Vicci would call her in an antibiotic. She is not feeling better and would like the medication to sent to [facility] in [place]. If there are any questions please call her at [phone].

## 2024-08-08 ENCOUNTER — Ambulatory Visit: Payer: Self-pay | Admitting: Family Medicine

## 2024-08-08 LAB — NOVEL CORONAVIRUS, NAA: SARS-CoV-2, NAA: NOT DETECTED

## 2024-08-09 ENCOUNTER — Ambulatory Visit: Admitting: Family Medicine

## 2024-08-09 LAB — CULTURE, GROUP A STREP

## 2024-08-09 LAB — RAPID STREP SCREEN (MED CTR MEBANE ONLY): Strep Gp A Ag, IA W/Reflex: NEGATIVE

## 2024-08-09 NOTE — Progress Notes (Signed)
 Contacted via MyChart  Strep testing is negative.

## 2024-08-09 NOTE — Telephone Encounter (Signed)
 Requested Prescriptions  Refused Prescriptions Disp Refills   lisinopril  (ZESTRIL ) 5 MG tablet [Pharmacy Med Name: LISINOPRIL  5MG  TABLETS] 90 tablet 1    Sig: TAKE 1 TABLET(5 MG) BY MOUTH DAILY     Cardiovascular:  ACE Inhibitors Failed - 08/09/2024 11:22 AM      Failed - Last BP in normal range    BP Readings from Last 1 Encounters:  08/06/24 (!) 143/85         Passed - Cr in normal range and within 180 days    Creatinine  Date Value Ref Range Status  04/09/2014 0.89 0.60 - 1.30 mg/dL Final   Creatinine, Ser  Date Value Ref Range Status  06/21/2024 0.71 0.57 - 1.00 mg/dL Final         Passed - K in normal range and within 180 days    Potassium  Date Value Ref Range Status  06/21/2024 4.0 3.5 - 5.2 mmol/L Final  04/09/2014 4.2 3.5 - 5.1 mmol/L Final         Passed - Patient is not pregnant      Passed - Valid encounter within last 6 months    Recent Outpatient Visits           3 days ago Uvulitis   [place] [facility] [place], Megan P, DO   1 month ago 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

## 2024-08-12 ENCOUNTER — Encounter: Payer: Self-pay | Admitting: Family Medicine

## 2024-08-12 ENCOUNTER — Ambulatory Visit: Admitting: Family Medicine

## 2024-08-12 VITALS — BP 160/90 | HR 91 | Temp 97.8°F | Ht 72.0 in | Wt 248.0 lb

## 2024-08-12 DIAGNOSIS — E1122 Type 2 diabetes mellitus with diabetic chronic kidney disease: Secondary | ICD-10-CM

## 2024-08-12 DIAGNOSIS — N181 Chronic kidney disease, stage 1: Secondary | ICD-10-CM | POA: Diagnosis not present

## 2024-08-12 DIAGNOSIS — Z23 Encounter for immunization: Secondary | ICD-10-CM | POA: Diagnosis not present

## 2024-08-12 DIAGNOSIS — E1129 Type 2 diabetes mellitus with other diabetic kidney complication: Secondary | ICD-10-CM

## 2024-08-12 MED ORDER — LISINOPRIL 5 MG PO TABS: 5.0000 mg | ORAL_TABLET | Freq: Every day | ORAL | 1 refills | Status: AC

## 2024-08-12 MED ORDER — GLIPIZIDE ER 5 MG PO TB24: 5.0000 mg | ORAL_TABLET | Freq: Every day | ORAL | 1 refills | Status: DC

## 2024-08-12 NOTE — Progress Notes (Signed)
 BP (!) 160/90   Pulse 91   Temp 97.8 F (36.6 C) (Oral)   Ht 6' (1.829 m)   Wt 248 lb (112.5 kg)   LMP 07/13/2020 (Approximate)   SpO2 97%   BMI 33.63 kg/m    Subjective:    Patient ID: [name], female    DOB: 03-04-1976, 48 y.o.   MRN: [id number]  HPI: [name] is a 48 y.o. female  Chief Complaint  Patient presents with   Diabetes   Hypertension   DIABETES Hypoglycemic episodes:no Polydipsia/polyuria: no Visual disturbance: yes Chest pain: no Paresthesias: no Glucose Monitoring: yes  Accucheck frequency: Daily Taking Insulin ?: no Blood Pressure Monitoring: rarely Retinal Examination: Not up to Date Foot Exam: Up to Date Diabetic Education: Completed Pneumovax: Up to Date Influenza: Up to Date Aspirin : no  Relevant past medical, surgical, family and social history reviewed and updated as indicated. Interim medical history since our last visit reviewed. Allergies and medications reviewed and updated.  Review of Systems  Constitutional: Negative.   Eyes:  Positive for visual disturbance. Negative for photophobia, pain, discharge, redness and itching.  Respiratory: Negative.    Cardiovascular: Negative.   Musculoskeletal: Negative.   Neurological: Negative.   Psychiatric/Behavioral: Negative.      Per HPI unless specifically indicated above     Objective:    BP (!) 160/90   Pulse 91   Temp 97.8 F (36.6 C) (Oral)   Ht 6' (1.829 m)   Wt 248 lb (112.5 kg)   LMP 07/13/2020 (Approximate)   SpO2 97%   BMI 33.63 kg/m   Wt Readings from Last 3 Encounters:  08/12/24 248 lb (112.5 kg)  08/06/24 248 lb 9.6 oz (112.8 kg)  06/21/24 252 lb 9.6 oz (114.6 kg)    Physical Exam Vitals and nursing note reviewed.  Constitutional:      General: She is not in acute distress.    Appearance: Normal appearance. She is obese. She is not ill-appearing, toxic-appearing or diaphoretic.  HENT:     Head: Normocephalic and atraumatic.      Right Ear: External ear normal.     Left Ear: External ear normal.     Nose: Nose normal.     Mouth/Throat:     Mouth: Mucous membranes are moist.     Pharynx: Oropharynx is clear.  Eyes:     General: No scleral icterus.       Right eye: No discharge.        Left eye: No discharge.     Extraocular Movements: Extraocular movements intact.     Conjunctiva/sclera: Conjunctivae normal.     Pupils: Pupils are equal, round, and reactive to light.  Cardiovascular:     Rate and Rhythm: Normal rate and regular rhythm.     Pulses: Normal pulses.     Heart sounds: Normal heart sounds. No murmur heard.    No friction rub. No gallop.  Pulmonary:     Effort: Pulmonary effort is normal. No respiratory distress.     Breath sounds: Normal breath sounds. No stridor. No wheezing, rhonchi or rales.  Chest:     Chest wall: No tenderness.  Musculoskeletal:        General: Normal range of motion.     Cervical back: Normal range of motion and neck supple.  Skin:    General: Skin is warm and dry.     Capillary Refill: Capillary refill takes less than 2 seconds.     Coloration:  Skin is not jaundiced or pale.     Findings: No bruising, erythema, lesion or rash.  Neurological:     General: No focal deficit present.     Mental Status: She is alert and oriented to person, place, and time. Mental status is at baseline.  Psychiatric:        Mood and Affect: Mood normal.        Behavior: Behavior normal.        Thought Content: Thought content normal.        Judgment: Judgment normal.     Results for orders placed or performed in visit on 08/06/24  Veritor Flu A/B Waived   Collection Time: 08/06/24 10:16 AM  Result Value Ref Range   Influenza A Negative Negative   Influenza B Negative Negative  Rapid Strep Screen ([facility] ONLY)   Collection Time: 08/06/24 10:32 AM   Specimen: Other   Other  Result Value Ref Range   Strep Gp A Ag, IA W/Reflex Negative Negative  Culture, Group A Strep    Collection Time: 08/06/24 10:32 AM   Other  Result Value Ref Range   Strep A Culture Negative   Novel Coronavirus, NAA ([facility])   Collection Time: 08/06/24 10:35 AM   Specimen: Nasopharyngeal(NP) swabs in vial transport medium  Result Value Ref Range   SARS-CoV-2, NAA Not Detected Not Detected      Assessment & Plan:   Problem List Items Addressed This Visit       Endocrine   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 We will start glipizide 5mg  and check in by mychart in 2 weeks, if AM sugars still >150 will increase to 10mg . Follow up 6 weeks. Call with any concerns.       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   Relevant Orders   Heplisav-B (HepB-CPG) Vaccine        Follow up plan: Return in about 6 weeks (around 09/23/2024).

## 2024-08-12 NOTE — Assessment & Plan Note (Signed)
 Sugars up to 8.5 from 8.0- likely due to her steroids. We will start glipizide 5mg  and check in by mychart in 2 weeks, if AM sugars still >150 will increase to 10mg . Follow up 6 weeks. Call with any concerns.

## 2024-08-13 LAB — BAYER DCA HB A1C WAIVED: HB A1C (BAYER DCA - WAIVED): 8.5 % — ABNORMAL HIGH (ref 4.8–5.6)

## 2024-09-04 ENCOUNTER — Encounter: Payer: Self-pay | Admitting: Family Medicine

## 2024-09-04 ENCOUNTER — Ambulatory Visit: Admitting: Family Medicine

## 2024-09-04 VITALS — BP 139/82 | HR 103 | Temp 97.5°F | Ht 72.0 in | Wt 247.0 lb

## 2024-09-04 DIAGNOSIS — J029 Acute pharyngitis, unspecified: Secondary | ICD-10-CM

## 2024-09-04 MED ORDER — LIDOCAINE VISCOUS HCL 2 % MT SOLN: 15.0000 mL | OROMUCOSAL | 1 refills | Status: DC | PRN

## 2024-09-04 MED ORDER — METHYLPREDNISOLONE SODIUM SUCC 40 MG IJ SOLR
40.0000 mg | Freq: Once | INTRAMUSCULAR | Status: AC
  Administered 2024-09-04: 40 mg via INTRAMUSCULAR

## 2024-09-04 NOTE — Progress Notes (Signed)
 BP 139/82   Pulse (!) 103   Temp (!) 97.5 F (36.4 C)   Ht 6' (1.829 m)   Wt 247 lb (112 kg)   LMP 07/13/2020 (Approximate)   SpO2 94%   BMI 33.50 kg/m    Subjective:    Patient ID: [name], female    DOB: 08/29/1976, 48 y.o.   MRN: [id number]  HPI: [name] is a 48 y.o. female  Chief Complaint  Patient presents with   Sore Throat    Started this morning. Concerns of  Uvulitis again   Cough   Nasal Congestion   UPPER RESPIRATORY TRACT INFECTION Duration: today Worst symptom: sore throat Fever: no Cough: no Shortness of breath: no Wheezing: no Chest pain: no Chest tightness: no Chest congestion: no Nasal congestion: no Runny nose: yes Post nasal drip: yes Sneezing: no Sore throat: yes Swollen glands: no Sinus pressure: yes Headache: yes Face pain: no Toothache: no Ear pain: no  Ear pressure: no  Eyes red/itching:no Eye drainage/crusting: no  Vomiting: no Rash: no Fatigue: yes Sick contacts: yes Strep contacts: yes  Context: worse Recurrent sinusitis: no Relief with OTC cold/cough medications: no  Treatments attempted: tylenol /ibuprofen    Relevant past medical, surgical, family and social history reviewed and updated as indicated. Interim medical history since our last visit reviewed. Allergies and medications reviewed and updated.  Review of Systems  Constitutional: Negative.   HENT:  Positive for congestion, postnasal drip, rhinorrhea, sinus pressure and sore throat. Negative for dental problem, drooling, ear discharge, ear pain, facial swelling, hearing loss, mouth sores, nosebleeds, sinus pain, sneezing, tinnitus, trouble swallowing and voice change.   Respiratory: Negative.    Cardiovascular: Negative.   Musculoskeletal: Negative.   Neurological: Negative.   Psychiatric/Behavioral: Negative.      Per HPI unless specifically indicated above     Objective:    BP 139/82   Pulse (!) 103   Temp (!) 97.5  F (36.4 C)   Ht 6' (1.829 m)   Wt 247 lb (112 kg)   LMP 07/13/2020 (Approximate)   SpO2 94%   BMI 33.50 kg/m   Wt Readings from Last 3 Encounters:  09/04/24 247 lb (112 kg)  08/12/24 248 lb (112.5 kg)  08/06/24 248 lb 9.6 oz (112.8 kg)    Physical Exam Vitals and nursing note reviewed.  Constitutional:      General: She is not in acute distress.    Appearance: Normal appearance. She is well-developed. She is obese. She is not ill-appearing, toxic-appearing or diaphoretic.  HENT:     Head: Normocephalic and atraumatic.     Right Ear: External ear normal.     Left Ear: External ear normal.     Nose: Nose normal.     Mouth/Throat:     Mouth: Mucous membranes are moist.     Pharynx: Pharyngeal swelling and posterior oropharyngeal erythema present.     Comments: Blisters at the back of her throat Eyes:     General: No scleral icterus.       Right eye: No discharge.        Left eye: No discharge.     Extraocular Movements: Extraocular movements intact.     Conjunctiva/sclera: Conjunctivae normal.     Pupils: Pupils are equal, round, and reactive to light.  Cardiovascular:     Rate and Rhythm: Normal rate and regular rhythm.     Pulses: Normal pulses.     Heart sounds: Normal heart sounds. No  murmur heard.    No friction rub. No gallop.  Pulmonary:     Effort: Pulmonary effort is normal. No respiratory distress.     Breath sounds: Normal breath sounds. No stridor. No wheezing, rhonchi or rales.  Chest:     Chest wall: No tenderness.  Musculoskeletal:        General: Normal range of motion.     Cervical back: Normal range of motion and neck supple.  Skin:    General: Skin is warm and dry.     Capillary Refill: Capillary refill takes less than 2 seconds.     Coloration: Skin is not jaundiced or pale.     Findings: No bruising, erythema, lesion or rash.  Neurological:     General: No focal deficit present.     Mental Status: She is alert and oriented to person, place, and  time. Mental status is at baseline.  Psychiatric:        Mood and Affect: Mood normal.        Behavior: Behavior normal.        Thought Content: Thought content normal.        Judgment: Judgment normal.     Results for orders placed or performed in visit on 08/12/24  Bayer DCA Hb A1c Waived   Collection Time: 08/12/24  4:08 PM  Result Value Ref Range   HB A1C (BAYER DCA - WAIVED) 8.5 (H) 4.8 - 5.6 %      Assessment & Plan:   Problem List Items Addressed This Visit   None Visit Diagnoses       Pharyngitis, unspecified etiology    -  Primary   Concern for hand, foot, mouth. Will start viscous lidocaine  and treat with solumedrol. Send strep culture. Call if not getting better or getting worse.   Relevant Medications   methylPREDNISolone  sodium succinate (SOLU-MEDROL ) 40 mg/mL injection 40 mg (Completed)   Other Relevant Orders   Rapid Strep Screen ([facility] ONLY)        Follow up plan: Return for As scheduled.

## 2024-09-07 LAB — CULTURE, GROUP A STREP

## 2024-09-07 LAB — RAPID STREP SCREEN (MED CTR MEBANE ONLY): Strep Gp A Ag, IA W/Reflex: NEGATIVE

## 2024-09-24 ENCOUNTER — Encounter: Payer: Self-pay | Admitting: Family Medicine

## 2024-09-24 ENCOUNTER — Ambulatory Visit: Admitting: Family Medicine

## 2024-09-24 ENCOUNTER — Other Ambulatory Visit: Payer: Self-pay

## 2024-09-24 VITALS — BP 138/79 | HR 94 | Temp 98.1°F | Ht 72.0 in | Wt 249.4 lb

## 2024-09-24 DIAGNOSIS — E1165 Type 2 diabetes mellitus with hyperglycemia: Secondary | ICD-10-CM

## 2024-09-24 MED ORDER — GLIPIZIDE ER 10 MG PO TB24: 10.0000 mg | ORAL_TABLET | Freq: Every day | ORAL | 1 refills | Status: AC

## 2024-09-24 MED ORDER — MUPIROCIN 2 % EX OINT: 1.0000 | TOPICAL_OINTMENT | Freq: Two times a day (BID) | CUTANEOUS | 0 refills | Status: AC

## 2024-09-24 MED ORDER — RYBELSUS 7 MG PO TABS
7.0000 mg | ORAL_TABLET | Freq: Every day | ORAL | 1 refills | Status: AC
  Filled 2024-09-24 – 2024-10-09 (×2): qty 90, 90d supply, fill #0

## 2024-09-24 NOTE — Progress Notes (Signed)
 BP 138/79   Pulse 94   Temp 98.1 F (36.7 C) (Oral)   Ht 6' (1.829 m)   Wt 249 lb 6.4 oz (113.1 kg)   LMP 07/13/2020 (Approximate)   SpO2 97%   BMI 33.82 kg/m    Subjective:    Patient ID: [name], female    DOB: 12/15/1975, 48 y.o.   MRN: [id number]  HPI: [name] is a 48 y.o. female  Chief Complaint  Patient presents with   Diabetes   DIABETES Hypoglycemic episodes:no Polydipsia/polyuria: yes Visual disturbance: no Chest pain: no Paresthesias: yes Glucose Monitoring: yes  Accucheck frequency: TID  Fasting glucose: 200s Taking Insulin ?: no Blood Pressure Monitoring: rarely Retinal Examination: Up to Date Foot Exam: Up to Date Diabetic Education: Completed Pneumovax: Up to Date Influenza: Up to Date Aspirin : no  Relevant past medical, surgical, family and social history reviewed and updated as indicated. Interim medical history since our last visit reviewed. Allergies and medications reviewed and updated.  Review of Systems  Constitutional: Negative.   Respiratory: Negative.    Cardiovascular: Negative.   Musculoskeletal: Negative.   Psychiatric/Behavioral: Negative.      Per HPI unless specifically indicated above     Objective:    BP 138/79   Pulse 94   Temp 98.1 F (36.7 C) (Oral)   Ht 6' (1.829 m)   Wt 249 lb 6.4 oz (113.1 kg)   LMP 07/13/2020 (Approximate)   SpO2 97%   BMI 33.82 kg/m   Wt Readings from Last 3 Encounters:  09/24/24 249 lb 6.4 oz (113.1 kg)  09/04/24 247 lb (112 kg)  08/12/24 248 lb (112.5 kg)    Physical Exam Vitals and nursing note reviewed.  Constitutional:      General: She is not in acute distress.    Appearance: Normal appearance. She is not ill-appearing, toxic-appearing or diaphoretic.  HENT:     Head: Normocephalic and atraumatic.     Right Ear: External ear normal.     Left Ear: External ear normal.     Nose: Nose normal.     Mouth/Throat:     Mouth: Mucous membranes  are moist.     Pharynx: Oropharynx is clear.  Eyes:     General: No scleral icterus.       Right eye: No discharge.        Left eye: No discharge.     Extraocular Movements: Extraocular movements intact.     Conjunctiva/sclera: Conjunctivae normal.     Pupils: Pupils are equal, round, and reactive to light.  Cardiovascular:     Rate and Rhythm: Normal rate and regular rhythm.     Pulses: Normal pulses.     Heart sounds: Normal heart sounds. No murmur heard.    No friction rub. No gallop.  Pulmonary:     Effort: Pulmonary effort is normal. No respiratory distress.     Breath sounds: Normal breath sounds. No stridor. No wheezing, rhonchi or rales.  Chest:     Chest wall: No tenderness.  Musculoskeletal:        General: Normal range of motion.     Cervical back: Normal range of motion and neck supple.  Skin:    General: Skin is warm and dry.     Capillary Refill: Capillary refill takes less than 2 seconds.     Coloration: Skin is not jaundiced or pale.     Findings: No bruising, erythema, lesion or rash.  Neurological:  General: No focal deficit present.     Mental Status: She is alert and oriented to person, place, and time. Mental status is at baseline.  Psychiatric:        Mood and Affect: Mood normal.        Behavior: Behavior normal.        Thought Content: Thought content normal.        Judgment: Judgment normal.     Results for orders placed or performed in visit on 09/04/24  Rapid Strep Screen ([facility] ONLY)   Collection Time: 09/04/24 11:29 AM   Specimen: Other   Other  Result Value Ref Range   Strep Gp A Ag, IA W/Reflex Negative Negative  Culture, Group A Strep   Collection Time: 09/04/24 11:29 AM   Other  Result Value Ref Range   Strep A Culture Negative       Assessment & Plan:   Problem List Items Addressed This Visit       Endocrine   Uncontrolled type 2 diabetes mellitus with hyperglycemia (HCC) - Primary   Up with A1c of 8.7. Will  increase her glipizide  to 10mg  and recheck in about 6 weeks. Call with any concerns.       Relevant Medications   Semaglutide  (RYBELSUS ) 7 MG TABS   glipiZIDE  (GLUCOTROL  XL) 10 MG 24 hr tablet   Other Relevant Orders   Bayer DCA Hb A1c Waived     Follow up plan: Return in about 6 weeks (around 11/05/2024).

## 2024-09-24 NOTE — Assessment & Plan Note (Signed)
 Up with A1c of 8.7. Will increase her glipizide  to 10mg  and recheck in about 6 weeks. Call with any concerns.

## 2024-09-25 LAB — BAYER DCA HB A1C WAIVED: HB A1C (BAYER DCA - WAIVED): 8.7 % — ABNORMAL HIGH (ref 4.8–5.6)

## 2024-10-09 ENCOUNTER — Other Ambulatory Visit: Payer: Self-pay

## 2024-10-14 ENCOUNTER — Ambulatory Visit: Payer: Self-pay

## 2024-10-14 NOTE — Telephone Encounter (Signed)
 FYI Only or Action Required?: FYI only for provider: appointment scheduled on 12.30.25.  Patient was last seen in primary care on 09/24/2024 by [name] Duwaine SQUIBB, DO.  Called Nurse Triage reporting Otalgia and Facial Pain.  Symptoms began several weeks ago.  Interventions attempted: OTC medications: mucinex .  Symptoms are: gradually worsening.  Triage Disposition: See HCP Within 4 Hours (Or PCP Triage)  Patient/caregiver understands and will follow disposition?: Yes    Copied from CRM #[id number]. Topic: Clinical - Red Word Triage >> Oct 14, 2024  9:44 AM [name] wrote: Red Word that prompted transfer to Nurse Triage: Sinus infection or ear infection or possibly both. Right ear hurting, pops when blowing nose, peroxide treatment made it worse. Pain throughout face. Reason for Disposition  [1] SEVERE sinus pain (e.g., excruciating) AND [2] not improved 2 hours after pain medicine  Answer Assessment - Initial Assessment Questions Pt states she has had a cough/cold symptoms for a couple of weeks, ear pain x2 days and today sinus pain got worse and is biggest concern. She states she has pain in her eyes and sinuses. She has had a cough, coughing up green or brown sometimes. Denies any fever or shortness of breath. Does state she has had some wheezing. Due to symptoms, recommendations were to be seen today. Due to schedule availability, RN recommended UC, pt declined. Pt requested an appt in office, pt scheduled for tomorrow. Rn gave strict instructions on when to go to [facility] or ER. Pt stated understanding.    1. LOCATION: Where does it hurt?      Eyes, sinuses 2. ONSET: When did the sinus pain start?  (e.g., hours, days)      Got worse yesterday 3. SEVERITY: How bad is the pain?   (Scale 0-10; or none, mild, moderate or severe)     Mod to severe 5. NASAL CONGESTION: Is the nose blocked? If Yes, ask: Can you open it or must you breathe through your mouth?     yes 6. NASAL  DISCHARGE: Do you have discharge from your nose? If so ask, What color?     green 7. FEVER: Do you have a fever? If Yes, ask: What is it, how was it measured, and when did it start?      denies 8. OTHER SYMPTOMS: Do you have any other symptoms? (e.g., sore throat, cough, earache, difficulty breathing)     Cough, earache  Protocols used: Sinus Pain or Congestion-A-AH

## 2024-10-15 ENCOUNTER — Ambulatory Visit: Admitting: Family Medicine

## 2024-10-15 ENCOUNTER — Encounter: Payer: Self-pay | Admitting: Family Medicine

## 2024-10-15 VITALS — BP 142/76 | HR 90 | Temp 97.9°F | Ht 72.0 in | Wt 246.6 lb

## 2024-10-15 DIAGNOSIS — H6691 Otitis media, unspecified, right ear: Secondary | ICD-10-CM | POA: Diagnosis not present

## 2024-10-15 DIAGNOSIS — J329 Chronic sinusitis, unspecified: Secondary | ICD-10-CM

## 2024-10-15 LAB — POC COVID19 BINAXNOW: SARS Coronavirus 2 Ag: NEGATIVE

## 2024-10-15 LAB — POCT INFLUENZA A/B
Influenza A, POC: NEGATIVE
Influenza B, POC: NEGATIVE

## 2024-10-15 MED ORDER — MOMETASONE FUROATE 50 MCG/ACT NA SUSP: NASAL | 0 refills | Status: AC

## 2024-10-15 MED ORDER — FLUCONAZOLE 150 MG PO TABS: 150.0000 mg | ORAL_TABLET | Freq: Once | ORAL | 0 refills | Status: AC

## 2024-10-15 MED ORDER — AMOXICILLIN-POT CLAVULANATE 875-125 MG PO TABS: 1.0000 | ORAL_TABLET | Freq: Two times a day (BID) | ORAL | 0 refills | Status: AC

## 2024-10-15 MED ORDER — PROMETHAZINE-DM 6.25-15 MG/5ML PO SYRP: 5.0000 mL | ORAL_SOLUTION | Freq: Four times a day (QID) | ORAL | 0 refills | Status: AC | PRN

## 2024-10-15 NOTE — Progress Notes (Signed)
 "    Primary Care / Sports Medicine Office Visit  Patient Information:  Patient ID: [name], female DOB: Nov 21, 1975 Age: 48 y.o. MRN: [id number]   [name] is a pleasant 48 y.o. female presenting with the following:  Chief Complaint  Patient presents with   Cough    Cough x 1 week. She was taking Mucinex  DM but made her feel worse. She is taking a cough syrup at night to help her sleep. She is also having pressure under her eyes.   Ear Pain    Right ear pain. When patient blows her nose she hears a loud pop and has more pain.     Vitals:   10/15/24 0831  BP: (!) 142/76  Pulse: 90  Temp: 97.9 F (36.6 C)  SpO2: 96%   Vitals:   10/15/24 0831  Weight: 246 lb 9.6 oz (111.9 kg)  Height: 6' (1.829 m)   Body mass index is 33.44 kg/m.  No results found.   Discussed the use of AI scribe software for clinical note transcription with the patient, who gave verbal consent to proceed.   Independent interpretation of notes and tests performed by another provider:   None  Procedures performed:   None  Pertinent History, Exam, Impression, and Recommendations:   History of Present Illness [name] is a 48 year old female with type 2 diabetes mellitus, asthma, and allergic rhinitis who presents with one week of cough and upper respiratory symptoms.  Cough and upper respiratory symptoms - Cough present for one week, productive of copious sputum - Nocturnal wheezing, worsened when supine - Albuterol  nebulizer used nightly, provides symptomatic relief and facilitates sleep - Discontinued guaifenesin  due to increased secretions and malaise, resulting in bed rest - No other over-the-counter cough remedies used  Otalgia and ear pressure - Right ear pain and pressure developed over several days - Pain radiates inferiorly from the ear - Pain aggravated by nose blowing - Ear pain described as most severe symptom, perceived to be  associated with respiratory complaints  Constitutional symptoms - Chills and difficulty achieving warmth, particularly last night, requiring a heating pad - Intermittent episodes of feeling very cold  Chronic respiratory disease management - Adherent to daily Trelegy and montelukast  for chronic respiratory management - Daily zinc and vitamin C supplementation - No recent changes in medication regimen  Physical Exam VITALS: P- 90, SaO2- 96% HEENT: Right tympanic membrane with focal central erythema and erythema in the canal. Nasal examination normal. Oropharynx with cobblestone otherwise examination normal. NECK: Lymphadenopathy in left posterior cervical chain, right anterior cervical chain, and bilaterally submandibular. CHEST: Lungs clear to auscultation bilaterally, no wheezes, rales, or rhonchi. CARDIOVASCULAR: Heart sounds normal.  Results Labs COVID-19 (10/15/2024): Negative Influenza A/B (10/15/2024): Negative  Assessment and Plan Acute otitis media and acute sinusitis with cough Acute right otitis media and sinusitis, likely post-viral, with postnasal drip causing nocturnal cough. Severe symptoms warrant antibiotic therapy. Discussed risk of antibiotic-associated yeast infection. - Prescribed high-strength Augmentin  twice daily. - Recommended probiotic supplementation during antibiotic therapy. - Prescribed mometasone nasal spray; instructed on proper use. Advised over-the-counter alternatives if not covered by insurance. - Recommended guaifenesin  to thin secretions. - Prescribed prescription-grade cough medication for nighttime and as-needed daytime use. - Advised continuation of zinc and vitamin C; recommended adding vitamin D . - Provided CDC criteria for return to work - Prescribed fluconazole  for possible yeast infection, with instructions for use if symptoms develop; provided two doses for recurrence. - Advised  rest and supportive care. - Instructed to message with  questions or if additional time off is needed beyond Friday.  Problem List Items Addressed This Visit   None Visit Diagnoses       Otitis of right ear    -  Primary   Relevant Medications   amoxicillin -clavulanate (AUGMENTIN ) 875-125 MG tablet   Other Relevant Orders   POC COVID-19 BinaxNow (Completed)   POCT Influenza A/B (Completed)     Rhinosinusitis       Relevant Medications   amoxicillin -clavulanate (AUGMENTIN ) 875-125 MG tablet   fluconazole  (DIFLUCAN ) 150 MG tablet   promethazine -dextromethorphan (PROMETHAZINE -DM) 6.25-15 MG/5ML syrup   mometasone (NASONEX) 50 MCG/ACT nasal spray   Other Relevant Orders   POC COVID-19 BinaxNow (Completed)   POCT Influenza A/B (Completed)        Orders & Medications Medications:  Meds ordered this encounter  Medications   amoxicillin -clavulanate (AUGMENTIN ) 875-125 MG tablet    Sig: Take 1 tablet by mouth 2 (two) times daily for 7 days.    Dispense:  14 tablet    Refill:  0   fluconazole  (DIFLUCAN ) 150 MG tablet    Sig: Take 1 tablet (150 mg total) by mouth once for 1 dose. May repeat after 3 days if needed.    Dispense:  2 tablet    Refill:  0   promethazine -dextromethorphan (PROMETHAZINE -DM) 6.25-15 MG/5ML syrup    Sig: Take 5 mLs by mouth 4 (four) times daily as needed for cough.    Dispense:  118 mL    Refill:  0   mometasone (NASONEX) 50 MCG/ACT nasal spray    Sig: One spray in each nostril twice a day, use left hand for right nostril, and right hand for left nostril.  Please dispense one bottle.    Dispense:  1 g    Refill:  0   Orders Placed This Encounter  Procedures   POC COVID-19 BinaxNow   POCT Influenza A/B     No follow-ups on file.     Selinda JINNY Ku, MD, [facility]   Primary Care Sports Medicine Primary Care and Sports Medicine at [facility]   "

## 2024-10-15 NOTE — Patient Instructions (Signed)
 VISIT SUMMARY:  You came in today with a cough and upper respiratory symptoms that have been ongoing for a week, along with ear pain and pressure. You were diagnosed with acute otitis media and sinusitis, likely post-viral, and a treatment plan was discussed and prescribed.  YOUR PLAN:  ACUTE OTITIS MEDIA AND SINUSITIS: You have an ear infection and sinusitis, likely following a viral infection, which is causing your cough and other symptoms. -Start taking Augmentin  twice daily as prescribed. -Take a probiotic supplement during your antibiotic therapy. -Use mometasone nasal spray as instructed. If it's not covered by your insurance, you can use an over-the-counter alternative. -Take guaifenesin  to help thin your secretions. -Use the prescription-grade cough medication at night and as needed during the day. -Continue taking zinc and vitamin C, and add vitamin D  to your regimen. -Follow the CDC criteria for returning to work: see below. -Use fluconazole  if you develop symptoms of a yeast infection. You have been provided with two doses in case of recurrence. -Get plenty of rest and take care of yourself. -Message us  with any questions or if you need more time off beyond Friday.

## 2024-10-26 ENCOUNTER — Ambulatory Visit: Admission: RE | Admit: 2024-10-26 | Source: Ambulatory Visit

## 2024-10-27 ENCOUNTER — Other Ambulatory Visit: Payer: Self-pay | Admitting: Family Medicine

## 2024-10-29 NOTE — Telephone Encounter (Signed)
 Requested Prescriptions  Refused Prescriptions Disp Refills   pantoprazole  (PROTONIX ) 40 MG tablet [Pharmacy Med Name: PANTOPRAZOLE  40MG  TABLETS] 180 tablet 1    Sig: TAKE 1 TABLET(40 MG) BY MOUTH TWICE DAILY     Gastroenterology: Proton Pump Inhibitors Passed - 10/29/2024  7:55 AM      Passed - Valid encounter within last 12 months    Recent Outpatient Visits           2 weeks ago Otitis of right ear   [place] [facility], Selinda PARAS, MD   1 month ago Uncontrolled type 2 diabetes mellitus with hyperglycemia [facility])   [place] [facility] [place], Megan P, DO   1 month ago Pharyngitis, unspecified etiology   [place] [facility] [place], Megan P, DO   2 months ago 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, without long-term current use of insulin  [facility])   [place] [facility] [place], [place], DO   2 months ago Uvulitis   [place] [facility] [place], Duwaine SQUIBB, DO       Future Appointments             In 3 weeks Clois Fret, MD [facility] Neurosurgery at [facility], [facility] [place]

## 2024-11-02 ENCOUNTER — Ambulatory Visit
Admission: RE | Admit: 2024-11-02 | Discharge: 2024-11-02 | Disposition: A | Source: Ambulatory Visit | Attending: Neurosurgery | Admitting: Neurosurgery

## 2024-11-02 DIAGNOSIS — D497 Neoplasm of unspecified behavior of endocrine glands and other parts of nervous system: Secondary | ICD-10-CM | POA: Insufficient documentation

## 2024-11-02 MED ORDER — GADOBUTROL 1 MMOL/ML IV SOLN
10.0000 mL | Freq: Once | INTRAVENOUS | Status: AC | PRN
  Administered 2024-11-02: 10 mL via INTRAVENOUS

## 2024-11-12 ENCOUNTER — Encounter: Payer: Self-pay | Admitting: Neurosurgery

## 2024-11-12 ENCOUNTER — Other Ambulatory Visit: Payer: Self-pay | Admitting: Neurosurgery

## 2024-11-12 DIAGNOSIS — D497 Neoplasm of unspecified behavior of endocrine glands and other parts of nervous system: Secondary | ICD-10-CM

## 2024-11-19 ENCOUNTER — Ambulatory Visit

## 2024-11-19 ENCOUNTER — Encounter: Payer: Self-pay | Admitting: Neurosurgery

## 2024-11-19 ENCOUNTER — Ambulatory Visit: Admitting: Neurosurgery

## 2024-11-19 ENCOUNTER — Ambulatory Visit: Admitting: Family Medicine

## 2024-11-19 VITALS — BP 166/84 | Ht 72.0 in | Wt 246.0 lb

## 2024-11-19 DIAGNOSIS — M963 Postlaminectomy kyphosis: Secondary | ICD-10-CM | POA: Diagnosis not present

## 2024-11-19 DIAGNOSIS — Z9889 Other specified postprocedural states: Secondary | ICD-10-CM

## 2024-11-19 DIAGNOSIS — D497 Neoplasm of unspecified behavior of endocrine glands and other parts of nervous system: Secondary | ICD-10-CM | POA: Diagnosis not present

## 2024-11-19 MED ORDER — GABAPENTIN 300 MG PO CAPS: 300.0000 mg | ORAL_CAPSULE | Freq: Three times a day (TID) | ORAL | 0 refills | Status: AC

## 2024-11-19 MED ORDER — CYCLOBENZAPRINE HCL 10 MG PO TABS: 10.0000 mg | ORAL_TABLET | Freq: Every day | ORAL | 1 refills | Status: AC

## 2024-11-22 ENCOUNTER — Ambulatory Visit: Admission: RE | Admit: 2024-11-22

## 2024-11-22 DIAGNOSIS — Z9889 Other specified postprocedural states: Secondary | ICD-10-CM

## 2024-11-22 DIAGNOSIS — D497 Neoplasm of unspecified behavior of endocrine glands and other parts of nervous system: Secondary | ICD-10-CM

## 2024-11-22 DIAGNOSIS — M963 Postlaminectomy kyphosis: Secondary | ICD-10-CM

## 2024-12-17 ENCOUNTER — Ambulatory Visit: Admitting: Family Medicine
